# Patient Record
Sex: Male | Born: 1950 | Race: White | Hispanic: No | Marital: Married | Smoking: Former smoker
Health system: Southern US, Community
[De-identification: ages and names within clinical notes are randomized; demographics above are authoritative.]

## PROBLEM LIST (undated history)

## (undated) DIAGNOSIS — G4730 Sleep apnea, unspecified: Secondary | ICD-10-CM

## (undated) DIAGNOSIS — I10 Essential (primary) hypertension: Secondary | ICD-10-CM

## (undated) DIAGNOSIS — E11622 Type 2 diabetes mellitus with other skin ulcer: Secondary | ICD-10-CM

## (undated) DIAGNOSIS — K649 Unspecified hemorrhoids: Secondary | ICD-10-CM

## (undated) DIAGNOSIS — G8929 Other chronic pain: Secondary | ICD-10-CM

## (undated) DIAGNOSIS — K219 Gastro-esophageal reflux disease without esophagitis: Secondary | ICD-10-CM

## (undated) DIAGNOSIS — Z6841 Body Mass Index (BMI) 40.0 and over, adult: Secondary | ICD-10-CM

## (undated) DIAGNOSIS — M549 Dorsalgia, unspecified: Secondary | ICD-10-CM

## (undated) DIAGNOSIS — IMO0002 Reserved for concepts with insufficient information to code with codable children: Secondary | ICD-10-CM

## (undated) DIAGNOSIS — R569 Unspecified convulsions: Secondary | ICD-10-CM

## (undated) DIAGNOSIS — M1990 Unspecified osteoarthritis, unspecified site: Secondary | ICD-10-CM

## (undated) DIAGNOSIS — G894 Chronic pain syndrome: Secondary | ICD-10-CM

## (undated) DIAGNOSIS — J189 Pneumonia, unspecified organism: Secondary | ICD-10-CM

## (undated) DIAGNOSIS — M1712 Unilateral primary osteoarthritis, left knee: Secondary | ICD-10-CM

## (undated) DIAGNOSIS — E1065 Type 1 diabetes mellitus with hyperglycemia: Secondary | ICD-10-CM

## (undated) DIAGNOSIS — M109 Gout, unspecified: Secondary | ICD-10-CM

## (undated) HISTORY — DX: Unspecified osteoarthritis, unspecified site: M19.90

## (undated) HISTORY — DX: Unspecified convulsions: R56.9

## (undated) HISTORY — PX: WISDOM TOOTH EXTRACTION: SHX21

## (undated) HISTORY — PX: OTHER SURGICAL HISTORY: SHX169

## (undated) HISTORY — DX: Body Mass Index (BMI) 40.0 and over, adult: Z684

## (undated) HISTORY — DX: Reserved for concepts with insufficient information to code with codable children: IMO0002

## (undated) HISTORY — DX: Type 1 diabetes mellitus with hyperglycemia: E10.65

## (undated) HISTORY — DX: Essential (primary) hypertension: I10

## (undated) HISTORY — DX: Chronic pain syndrome: G89.4

## (undated) HISTORY — DX: Dorsalgia, unspecified: M54.9

## (undated) HISTORY — DX: Gout, unspecified: M10.9

## (undated) HISTORY — DX: Type 2 diabetes mellitus with other skin ulcer: E11.622

## (undated) HISTORY — DX: Unilateral primary osteoarthritis, left knee: M17.12

## (undated) HISTORY — DX: Other chronic pain: G89.29

## (undated) HISTORY — DX: Morbid (severe) obesity due to excess calories: E66.01

---

## 2001-09-13 ENCOUNTER — Ambulatory Visit (HOSPITAL_COMMUNITY): Admission: RE | Admit: 2001-09-13 | Discharge: 2001-09-13 | Payer: Self-pay | Admitting: Gastroenterology

## 2003-06-25 ENCOUNTER — Encounter: Admission: RE | Admit: 2003-06-25 | Discharge: 2003-09-23 | Payer: Self-pay | Admitting: Family Medicine

## 2004-09-15 ENCOUNTER — Ambulatory Visit (HOSPITAL_COMMUNITY): Admission: RE | Admit: 2004-09-15 | Discharge: 2004-09-15 | Payer: Self-pay | Admitting: Orthopedic Surgery

## 2005-07-15 ENCOUNTER — Encounter: Admission: RE | Admit: 2005-07-15 | Discharge: 2005-10-13 | Payer: Self-pay | Admitting: Family Medicine

## 2007-10-16 ENCOUNTER — Encounter: Admission: RE | Admit: 2007-10-16 | Discharge: 2007-11-01 | Payer: Self-pay | Admitting: Family Medicine

## 2007-11-15 ENCOUNTER — Encounter: Admission: RE | Admit: 2007-11-15 | Discharge: 2007-11-15 | Payer: Self-pay | Admitting: Family Medicine

## 2008-02-18 ENCOUNTER — Encounter (INDEPENDENT_AMBULATORY_CARE_PROVIDER_SITE_OTHER): Payer: Self-pay | Admitting: Family Medicine

## 2008-02-18 ENCOUNTER — Ambulatory Visit: Payer: Self-pay | Admitting: Surgery

## 2008-02-18 ENCOUNTER — Ambulatory Visit (HOSPITAL_COMMUNITY): Admission: RE | Admit: 2008-02-18 | Discharge: 2008-02-18 | Payer: Self-pay | Admitting: Family Medicine

## 2009-05-04 ENCOUNTER — Ambulatory Visit: Payer: Self-pay | Admitting: Gastroenterology

## 2010-01-15 LAB — HM COLONOSCOPY

## 2011-03-25 NOTE — Procedures (Signed)
[place]  Patient:    Christian [name], BALDREE Visit Number: [id number] MRN: [id number]          Service Type: Attending:  Verlin Grills, M.D. Dictated by:   Verlin Grills, M.D. Proc. Date: 09/13/01   CC:         Caryn Bee L. Little, M.D.   Procedure Report  DATE OF BIRTH:  [birth year]  REFERRING PHYSICIAN:  Anna Genre. Little, M.D.  PROCEDURE PERFORMED:  Colonoscopy.  ENDOSCOPIST:  Verlin Grills, M.D.  INDICATIONS FOR PROCEDURE:  The patient is a 60 year old male who has intermittent painless hematochezia.  He denies a personal or family history of colon cancer.  MEDICATION ALLERGIES:  Penicillin.  CHRONIC MEDICATIONS:  Arthrotek and allopurinol.  PAST MEDICAL HISTORY:  Gout, epileptic seizures in 1977, degenerative joint disease.  PREMEDICATION:  Versed 5 mg, Demerol 50 mg.  ENDOSCOPE:  Olympus pediatric video colonoscope.  DESCRIPTION OF PROCEDURE:  After obtaining informed consent, the patient was placed in the left lateral decubitus position.  I administered intravenous Demerol and intravenous Versed to achieve conscious sedation for the procedure.  The patients blood pressure, oxygen saturation and cardiac rhythm were monitored throughout the procedure and documented in the medical record.  Anal inspection was normal.  Digital rectal exam revealed a nonnodular prostate.  The Olympus pediatric video colonoscope was then introduced into the rectum and easily advanced to the cecum.  The patients colonic preparation for the exam today was excellent.  Rectum:  Normal.  Sigmoid colon and descending colon:  Left colonic diverticulosis.  Splenic flexure:  Normal.  Transverse colon:  Normal.  Hepatic flexure:  Normal.  Ascending colon:  Normal.  Cecum and ileocecal valve:  Normal.  ASSESSMENT:  A few small diverticula are noted in the left colon; otherwise normal proctocolonoscopy to the cecum.  No  endoscopic evidence for the presence of colorectal neoplasia or inflammatory bowel disease. Dictated by:   Verlin Grills, M.D. Attending:  Verlin Grills, M.D. DD:  09/13/01 TD:  09/14/01 Job: [id number]

## 2013-09-11 ENCOUNTER — Encounter: Payer: Self-pay | Admitting: Family Medicine

## 2013-09-11 ENCOUNTER — Ambulatory Visit (INDEPENDENT_AMBULATORY_CARE_PROVIDER_SITE_OTHER): Payer: BC Managed Care – PPO | Admitting: Family Medicine

## 2013-09-11 VITALS — BP 120/80 | HR 73 | Temp 97.7°F | Ht 67.0 in | Wt 304.2 lb

## 2013-09-11 DIAGNOSIS — Z79899 Other long term (current) drug therapy: Secondary | ICD-10-CM

## 2013-09-11 DIAGNOSIS — M109 Gout, unspecified: Secondary | ICD-10-CM

## 2013-09-11 DIAGNOSIS — G894 Chronic pain syndrome: Secondary | ICD-10-CM

## 2013-09-11 DIAGNOSIS — M1990 Unspecified osteoarthritis, unspecified site: Secondary | ICD-10-CM

## 2013-09-11 DIAGNOSIS — E119 Type 2 diabetes mellitus without complications: Secondary | ICD-10-CM

## 2013-09-11 DIAGNOSIS — M549 Dorsalgia, unspecified: Secondary | ICD-10-CM

## 2013-09-11 DIAGNOSIS — R569 Unspecified convulsions: Secondary | ICD-10-CM

## 2013-09-11 DIAGNOSIS — G8929 Other chronic pain: Secondary | ICD-10-CM

## 2013-09-11 DIAGNOSIS — I10 Essential (primary) hypertension: Secondary | ICD-10-CM | POA: Insufficient documentation

## 2013-09-11 DIAGNOSIS — Z13220 Encounter for screening for lipoid disorders: Secondary | ICD-10-CM

## 2013-09-11 DIAGNOSIS — Z6841 Body Mass Index (BMI) 40.0 and over, adult: Secondary | ICD-10-CM

## 2013-09-11 LAB — MICROALBUMIN / CREATININE URINE RATIO
Creatinine,U: 67.8 mg/dL
Microalb Creat Ratio: 0.3 mg/g (ref 0.0–30.0)
Microalb, Ur: 0.2 mg/dL (ref 0.0–1.9)

## 2013-09-11 LAB — HEPATIC FUNCTION PANEL
AST: 32 U/L (ref 0–37)
Albumin: 4 g/dL (ref 3.5–5.2)
Alkaline Phosphatase: 65 U/L (ref 39–117)
Bilirubin, Direct: 0 mg/dL (ref 0.0–0.3)
Total Bilirubin: 0.7 mg/dL (ref 0.3–1.2)
Total Protein: 8.2 g/dL (ref 6.0–8.3)

## 2013-09-11 LAB — HEMOGLOBIN A1C: Hgb A1c MFr Bld: 9.8 % — ABNORMAL HIGH (ref 4.6–6.5)

## 2013-09-11 LAB — CBC WITH DIFFERENTIAL/PLATELET
Basophils Absolute: 0.1 10*3/uL (ref 0.0–0.1)
Basophils Relative: 0.4 % (ref 0.0–3.0)
Eosinophils Absolute: 0.4 10*3/uL (ref 0.0–0.7)
Eosinophils Relative: 3.5 % (ref 0.0–5.0)
HCT: 40.6 % (ref 39.0–52.0)
Hemoglobin: 13.6 g/dL (ref 13.0–17.0)
Lymphocytes Relative: 23.5 % (ref 12.0–46.0)
Lymphs Abs: 3 10*3/uL (ref 0.7–4.0)
MCHC: 33.4 g/dL (ref 30.0–36.0)
MCV: 97.2 fl (ref 78.0–100.0)
Monocytes Absolute: 0.8 10*3/uL (ref 0.1–1.0)
Monocytes Relative: 6.5 % (ref 3.0–12.0)
Neutro Abs: 8.3 10*3/uL — ABNORMAL HIGH (ref 1.4–7.7)
Neutrophils Relative %: 66.1 % (ref 43.0–77.0)
Platelets: 247 10*3/uL (ref 150.0–400.0)
RBC: 4.18 Mil/uL — ABNORMAL LOW (ref 4.22–5.81)
RDW: 13.6 % (ref 11.5–14.6)
WBC: 12.6 10*3/uL — ABNORMAL HIGH (ref 4.5–10.5)

## 2013-09-11 LAB — BASIC METABOLIC PANEL
BUN: 13 mg/dL (ref 6–23)
CO2: 27 mEq/L (ref 19–32)
Calcium: 9.5 mg/dL (ref 8.4–10.5)
Chloride: 100 mEq/L (ref 96–112)
Creatinine, Ser: 0.7 mg/dL (ref 0.4–1.5)
GFR: 117.22 mL/min (ref >60.00)
Glucose, Bld: 145 mg/dL — ABNORMAL HIGH (ref 70–99)
Potassium: 4 mEq/L (ref 3.5–5.1)
Sodium: 136 mEq/L (ref 135–145)

## 2013-09-11 LAB — LIPID PANEL
Cholesterol: 163 mg/dL (ref 0–200)
LDL Cholesterol: 92 mg/dL (ref 0–99)
Total CHOL/HDL Ratio: 4
Triglycerides: 164 mg/dL — ABNORMAL HIGH (ref 0.0–149.0)

## 2013-09-11 MED ORDER — OXYCODONE-ACETAMINOPHEN 5-325 MG PO TABS: 1.0000 | ORAL_TABLET | Freq: Two times a day (BID) | ORAL | Status: DC

## 2013-09-11 NOTE — Progress Notes (Signed)
Pre-visit discussion using our clinic review tool. No additional management support is needed unless otherwise documented below in the visit note.  

## 2013-09-11 NOTE — Progress Notes (Signed)
Date:  09/11/2013   Name:  Christian [name]   DOB:  Nov 02, 1951   MRN:  [id number] Gender: male Age: 62 y.o.  Primary Physician:  Hannah Beat, MD   Chief Complaint: New Patient   Subjective:   History of Present Illness:  Christian [name] is a 62 y.o. pleasant patient who presents with the following:  Former patient of Dr. Prince Rome  DM, insulin dependent and on lantus at 68 units now. Also taking metformin. 68 units of lantus - was seeing Dr. Carmela Hurt before this. This morning it was 203. 160-170 normally.   Reports that his lipids have been good for his lifetime.  Has some OA, chronic intermittent back pain and takes some nsaids and intermitent percocet.   He also has very stable gout.  Patient Active Problem List   Diagnosis Date Noted  . Gout 09/13/2013  . Chronic pain syndrome 09/13/2013  . Osteoarthritis 09/13/2013  . Chronic back pain 09/13/2013  . Seizures   . Hypertension   . Type 1 diabetes mellitus, uncontrolled     Past Medical History  Diagnosis Date  . Hypertension   . Seizures   . Type 1 diabetes mellitus, uncontrolled   . Gout 09/13/2013  . Chronic pain syndrome 09/13/2013  . Osteoarthritis 09/13/2013  . Chronic back pain 09/13/2013    No past surgical history on file.  History   Social History  . Marital Status: Married    Spouse Name: N/A    Number of Children: N/A  . Years of Education: N/A   Occupational History  . Not on file.   Social History Main Topics  . Smoking status: Former Games developer  . Smokeless tobacco: Never Used  . Alcohol Use: Yes     Comment: rare  . Drug Use: No  . Sexual Activity: Yes    Partners: Female   Other Topics Concern  . Not on file   Social History Narrative   Psychiatric nurse for [employer]   Married    Family History  Problem Relation Age of Onset  . Alcohol abuse Father   . Diabetes Maternal Aunt   . Arthritis Maternal Grandmother   . Alcohol abuse Paternal Grandmother   . Alcohol abuse  Paternal Grandfather     Allergies  Allergen Reactions  . Penicillins     REACTION: swelling, rash    Medication list has been reviewed and updated.  Review of Systems:   GEN: No fevers, chills. Nontoxic. Primarily MSK c/o today. MSK: Detailed in the HPI GI: tolerating PO intake without difficulty Neuro: No numbness, parasthesias, or tingling associated. Otherwise the pertinent positives of the ROS are noted above.    Objective:   Physical Examination: BP 120/80  Pulse 73  Temp(Src) 97.7 F (36.5 C) (Oral)  Ht 5\' 7"  (1.702 m)  Wt 304 lb 4 oz (138.007 kg)  BMI 47.64 kg/m2  Ideal Body Weight: Weight in (lb) to have BMI = 25: 159.3   GEN: WDWN, NAD, Non-toxic, A & O x 3 HEENT: Atraumatic, Normocephalic. Neck supple. No masses, No LAD. Ears and Nose: No external deformity. CV: RRR, No M/G/R. No JVD. No thrill. No extra heart sounds. PULM: CTA B, no wheezes, crackles, rhonchi. No retractions. No resp. distress. No accessory muscle use. EXTR: No c/c/e NEURO Normal gait.  PSYCH: Normally interactive. Conversant. Not depressed or anxious appearing.  Calm demeanor.    Assessment & Plan:    Diabetes mellitus without complication - Plan: Basic metabolic panel,  Hemoglobin A1c, Microalbumin / creatinine urine ratio  Hypertension  Screening for lipoid disorders - Plan: Lipid panel  Encounter for long-term (current) use of other medications - Plan: CBC with Differential, Hepatic function panel  Seizures  Gout  Chronic pain syndrome  Osteoarthritis  Chronic back pain  Morbid obesity with body mass index of 45.0-49.9 in adult  We will obtain records from the patient's prior physicians.  Will check and see how he is doing with DM and adjust meds as needed. Check lipids, as well.  There are no Patient Instructions on file for this visit.  Orders Today:  Orders Placed This Encounter  Procedures  . Basic metabolic panel  . CBC with Differential  . Hepatic  function panel  . Hemoglobin A1c  . Microalbumin / creatinine urine ratio  . Lipid panel    New medications, updates to list, dose adjustments: Meds ordered this encounter  Medications  . aspirin 81 MG chewable tablet    Sig: Chew 81 mg by mouth daily.  Marland Kitchen allopurinol (ZYLOPRIM) 300 MG tablet    Sig: Take 150 mg by mouth daily.  . Diclofenac-Misoprostol 75-0.2 MG TBEC    Sig: Take 1 tablet by mouth 2 (two) times daily.  . ergocalciferol (VITAMIN D2) 50000 UNITS capsule    Sig: Take 50,000 Units by mouth every 30 (thirty) days.  Marland Kitchen DISCONTD: oxyCODONE-acetaminophen (PERCOCET/ROXICET) 5-325 MG per tablet    Sig: Take 1 tablet by mouth 2 (two) times daily.  . bisoprolol-hydrochlorothiazide (ZIAC) 10-6.25 MG per tablet    Sig: Take 1 tablet by mouth every morning.  . metFORMIN (GLUCOPHAGE) 500 MG tablet    Sig: Take 1,000 mg by mouth 2 (two) times daily at 8 am and 10 pm.  . Insulin Glargine (LANTUS SOLOSTAR) 100 UNIT/ML SOPN    Sig: Inject 68 Units into the skin at bedtime.  Marland Kitchen oxyCODONE-acetaminophen (PERCOCET/ROXICET) 5-325 MG per tablet    Sig: Take 1 tablet by mouth 2 (two) times daily.    Dispense:  60 tablet    Refill:  0    Signed,  Hayat Warbington T. Ladon Vandenberghe, MD, CAQ Sports Medicine  [facility] at [facility] [address] [place] [address] Phone: [phone] Fax: [phone]  Updated Complete Medication List:   Medication List       This list is accurate as of: 09/11/13 11:59 PM.  Always use your most recent med list.               allopurinol 300 MG tablet  Commonly known as:  ZYLOPRIM  Take 150 mg by mouth daily.     aspirin 81 MG chewable tablet  Chew 81 mg by mouth daily.     bisoprolol-hydrochlorothiazide 10-6.25 MG per tablet  Commonly known as:  ZIAC  Take 1 tablet by mouth every morning.     Diclofenac-Misoprostol 75-0.2 MG Tbec  Take 1 tablet by mouth 2 (two) times daily.     ergocalciferol 50000 UNITS capsule  Commonly known as:   VITAMIN D2  Take 50,000 Units by mouth every 30 (thirty) days.     LANTUS SOLOSTAR 100 UNIT/ML Sopn  Generic drug:  Insulin Glargine  Inject 68 Units into the skin at bedtime.     metFORMIN 500 MG tablet  Commonly known as:  GLUCOPHAGE  Take 1,000 mg by mouth 2 (two) times daily at 8 am and 10 pm.     oxyCODONE-acetaminophen 5-325 MG per tablet  Commonly known as:  PERCOCET/ROXICET  Take 1 tablet by mouth 2 (two) times daily.

## 2013-09-13 ENCOUNTER — Encounter: Payer: Self-pay | Admitting: Family Medicine

## 2013-09-13 ENCOUNTER — Other Ambulatory Visit: Payer: Self-pay | Admitting: Family Medicine

## 2013-09-13 DIAGNOSIS — M109 Gout, unspecified: Secondary | ICD-10-CM

## 2013-09-13 DIAGNOSIS — G8929 Other chronic pain: Secondary | ICD-10-CM | POA: Insufficient documentation

## 2013-09-13 DIAGNOSIS — G894 Chronic pain syndrome: Secondary | ICD-10-CM

## 2013-09-13 DIAGNOSIS — M1990 Unspecified osteoarthritis, unspecified site: Secondary | ICD-10-CM

## 2013-09-13 DIAGNOSIS — M1712 Unilateral primary osteoarthritis, left knee: Secondary | ICD-10-CM | POA: Insufficient documentation

## 2013-09-13 HISTORY — DX: Chronic pain syndrome: G89.4

## 2013-09-13 HISTORY — DX: Gout, unspecified: M10.9

## 2013-09-13 HISTORY — DX: Morbid (severe) obesity due to excess calories: E66.01

## 2013-09-13 HISTORY — DX: Other chronic pain: G89.29

## 2013-09-13 HISTORY — DX: Unspecified osteoarthritis, unspecified site: M19.90

## 2013-09-22 ENCOUNTER — Other Ambulatory Visit: Payer: Self-pay | Admitting: Internal Medicine

## 2013-09-23 ENCOUNTER — Other Ambulatory Visit: Payer: Self-pay | Admitting: Family Medicine

## 2013-09-30 ENCOUNTER — Ambulatory Visit (INDEPENDENT_AMBULATORY_CARE_PROVIDER_SITE_OTHER): Payer: BC Managed Care – PPO | Admitting: Family Medicine

## 2013-09-30 ENCOUNTER — Encounter: Payer: Self-pay | Admitting: Family Medicine

## 2013-09-30 VITALS — BP 128/70 | HR 81 | Temp 97.9°F | Ht 67.0 in | Wt 302.8 lb

## 2013-09-30 DIAGNOSIS — M1711 Unilateral primary osteoarthritis, right knee: Secondary | ICD-10-CM

## 2013-09-30 DIAGNOSIS — M1710 Unilateral primary osteoarthritis, unspecified knee: Secondary | ICD-10-CM

## 2013-09-30 MED ORDER — BISOPROLOL-HYDROCHLOROTHIAZIDE 10-6.25 MG PO TABS: 1.0000 | ORAL_TABLET | Freq: Every morning | ORAL | Status: DC

## 2013-09-30 NOTE — Progress Notes (Signed)
Pre-visit discussion using our clinic review tool. No additional management support is needed unless otherwise documented below in the visit note.  

## 2013-09-30 NOTE — Progress Notes (Signed)
    Procedure only. Has done well from synvisc in the past, proven OA.  Synvisc-1 Injection.  R knee.   Knee Injection: Synvisc-One, RIGHT Patient verbally consented to procedure. Risks (including infection), benefits, and alternatives explained. Sterilely prepped with Chloraprep. Ethyl cholride used for anesthesia, then 7 cc of Lidocaine 1% used for anesthesia in the anterolateral position. Reprepped with Chloraprep.  Anterolateral approach used to inject joint without difficulty, injected with Synvisc-One, 6 mL. No complications with procedure and tolerated well.   Osteoarthrosis, unspecified whether generalized or localized, lower leg  Arthritis of right knee  There are no Patient Instructions on file for this visit.  Orders Today:  No orders of the defined types were placed in this encounter.    New medications, updates to list, dose adjustments: Meds ordered this encounter  Medications  . bisoprolol-hydrochlorothiazide (ZIAC) 10-6.25 MG per tablet    Sig: Take 1 tablet by mouth every morning.    Dispense:  90 tablet    Refill:  3    Signed,  Jaaron Oleson T. Undine Nealis, MD, CAQ Sports Medicine  [facility] at [facility] [address] [place] [address] Phone: [phone] Fax: [phone]  Updated Complete Medication List:   Medication List       This list is accurate as of: 09/30/13 10:34 AM.  Always use your most recent med list.               allopurinol 300 MG tablet  Commonly known as:  ZYLOPRIM  TAKE 1 TABLET BY MOUTH DAILY FOR GOUT     aspirin 81 MG chewable tablet  Chew 81 mg by mouth daily.     bisoprolol-hydrochlorothiazide 10-6.25 MG per tablet  Commonly known as:  ZIAC  Take 1 tablet by mouth every morning.     Diclofenac-Misoprostol 75-0.2 MG Tbec  TAKE 1 TABLET BY MOUTH TWICE A DAY AS NEEDED FOR PAIN AND INFLAMMATION     ergocalciferol 50000 UNITS capsule  Commonly known as:  VITAMIN D2  Take 50,000 Units by mouth every 30  (thirty) days.     LANTUS SOLOSTAR 100 UNIT/ML Sopn  Generic drug:  Insulin Glargine  Inject 68 Units into the skin at bedtime.     metFORMIN 500 MG tablet  Commonly known as:  GLUCOPHAGE  Take 1,000 mg by mouth 2 (two) times daily at 8 am and 10 pm.     oxyCODONE-acetaminophen 5-325 MG per tablet  Commonly known as:  PERCOCET/ROXICET  Take 1 tablet by mouth 2 (two) times daily.

## 2013-10-30 ENCOUNTER — Other Ambulatory Visit: Payer: Self-pay | Admitting: Family Medicine

## 2013-12-09 ENCOUNTER — Encounter: Payer: Self-pay | Admitting: Family Medicine

## 2013-12-09 ENCOUNTER — Ambulatory Visit (INDEPENDENT_AMBULATORY_CARE_PROVIDER_SITE_OTHER): Payer: BC Managed Care – PPO | Admitting: Family Medicine

## 2013-12-09 VITALS — BP 130/90 | HR 85 | Temp 97.9°F | Wt 307.5 lb

## 2013-12-09 DIAGNOSIS — E1065 Type 1 diabetes mellitus with hyperglycemia: Secondary | ICD-10-CM

## 2013-12-09 DIAGNOSIS — H6980 Other specified disorders of Eustachian tube, unspecified ear: Secondary | ICD-10-CM

## 2013-12-09 DIAGNOSIS — H8110 Benign paroxysmal vertigo, unspecified ear: Secondary | ICD-10-CM | POA: Insufficient documentation

## 2013-12-09 DIAGNOSIS — IMO0002 Reserved for concepts with insufficient information to code with codable children: Secondary | ICD-10-CM

## 2013-12-09 LAB — COMPREHENSIVE METABOLIC PANEL
ALT: 33 U/L (ref 0–53)
AST: 27 U/L (ref 0–37)
Albumin: 3.7 g/dL (ref 3.5–5.2)
Alkaline Phosphatase: 62 U/L (ref 39–117)
BILIRUBIN TOTAL: 0.8 mg/dL (ref 0.3–1.2)
BUN: 13 mg/dL (ref 6–23)
CO2: 26 mEq/L (ref 19–32)
Calcium: 9 mg/dL (ref 8.4–10.5)
Chloride: 103 mEq/L (ref 96–112)
Creatinine, Ser: 0.8 mg/dL (ref 0.4–1.5)
GFR: 111.74 mL/min (ref >60.00)
Glucose, Bld: 180 mg/dL — ABNORMAL HIGH (ref 70–99)
Potassium: 4.4 mEq/L (ref 3.5–5.1)
Sodium: 138 mEq/L (ref 135–145)
Total Protein: 7.6 g/dL (ref 6.0–8.3)

## 2013-12-09 MED ORDER — TRIAMCINOLONE ACETONIDE 0.1 % EX CREA: 1.0000 "application " | TOPICAL_CREAM | Freq: Two times a day (BID) | CUTANEOUS | Status: DC

## 2013-12-09 MED ORDER — FLUTICASONE PROPIONATE 50 MCG/ACT NA SUSP: 2.0000 | Freq: Every day | NASAL | Status: DC

## 2013-12-09 NOTE — Assessment & Plan Note (Signed)
Treat with nasal steroid. °

## 2013-12-09 NOTE — Patient Instructions (Signed)
Start nasal steroid spray.  Can use meclizine (antivert) as needed for vertigo. Start home BPPV exercises. Call if not improving with vertigo in 2 weeks. Stop at lab on way out for routine labs. Schedule DM follow up with Dr. Patsy Lageropland in next week or 2.

## 2013-12-09 NOTE — Assessment & Plan Note (Signed)
Home exercises given, use meclizine prn severe symptoms. Follow up if not better in 2 weeks.

## 2013-12-09 NOTE — Progress Notes (Signed)
   Subjective:    Patient ID: Christian BarmanGeorge E [name], male    DOB: 08/18/1951, 63 y.o.   MRN: [id number]  HPI  [age] year old male pt of Dr. Patsy Lageropland with history of uncontrolled DM (175), HTN, chronic back pain presents with new onset of dizziness in last 4 days. Sudden onset.  Describes like vertigo. No nausea, no emeisis. Right ear popping at times.  Worse with movement of head. Worse with lying and rolling over. No headache. Symptoms have been intermittent, but returned yesterday morning.    He has had similar symptoms in past as SE of BP med.  No new medication, does have new glasses.  using oxycodone once a day, has no changed.   no fever.   Review of Systems  Constitutional: Negative for fever and fatigue.  HENT: Negative for ear pain.   Eyes: Negative for pain.  Respiratory: Negative for cough and shortness of breath.   Cardiovascular: Negative for chest pain.  Gastrointestinal: Negative for abdominal pain.  Musculoskeletal:       Right knee pain intermittant       Objective:   Physical Exam  Constitutional: He is oriented to person, place, and time. Vital signs are normal. He appears well-developed and well-nourished.  Morbidly obese male in NAD  HENT:  Head: Normocephalic.  Right Ear: Hearing normal. Tympanic membrane is not erythematous. A middle ear effusion is present.  Left Ear: Hearing normal. Tympanic membrane is not erythematous.  No middle ear effusion.  Nose: Nose normal.  Mouth/Throat: Oropharynx is clear and moist and mucous membranes are normal.  Neck: Trachea normal. Carotid bruit is not present. No mass and no thyromegaly present.  Cardiovascular: Normal rate, regular rhythm and normal pulses.  Exam reveals no gallop, no distant heart sounds and no friction rub.   No murmur heard. No peripheral edema  Pulmonary/Chest: Effort normal and breath sounds normal. No respiratory distress.  Neurological: He is alert and oriented to person, place, and time. He has  normal strength. No cranial nerve deficit or sensory deficit. He displays a negative Romberg sign. Coordination and gait normal.  Positive Dix Hallpike showing nystagmus  Skin: Skin is warm, dry and intact. No rash noted.  Psychiatric: He has a normal mood and affect. His speech is normal and behavior is normal. Thought content normal.          Assessment & Plan:

## 2013-12-09 NOTE — Progress Notes (Signed)
Pre-visit discussion using our clinic review tool. No additional management support is needed unless otherwise documented below in the visit note.  

## 2013-12-12 ENCOUNTER — Telehealth: Payer: Self-pay

## 2013-12-12 NOTE — Telephone Encounter (Signed)
Relevant patient education assigned to patient using Emmi. ° °

## 2014-01-12 ENCOUNTER — Other Ambulatory Visit: Payer: Self-pay | Admitting: Family Medicine

## 2014-03-12 ENCOUNTER — Other Ambulatory Visit: Payer: Self-pay | Admitting: Family Medicine

## 2014-03-24 ENCOUNTER — Other Ambulatory Visit (INDEPENDENT_AMBULATORY_CARE_PROVIDER_SITE_OTHER): Payer: BC Managed Care – PPO

## 2014-03-24 DIAGNOSIS — IMO0002 Reserved for concepts with insufficient information to code with codable children: Secondary | ICD-10-CM

## 2014-03-24 DIAGNOSIS — E1065 Type 1 diabetes mellitus with hyperglycemia: Secondary | ICD-10-CM

## 2014-03-24 LAB — HEMOGLOBIN A1C: Hgb A1c MFr Bld: 10.8 % — ABNORMAL HIGH (ref 4.6–6.5)

## 2014-03-26 ENCOUNTER — Ambulatory Visit: Payer: BC Managed Care – PPO | Admitting: Family Medicine

## 2014-03-26 ENCOUNTER — Ambulatory Visit (INDEPENDENT_AMBULATORY_CARE_PROVIDER_SITE_OTHER): Payer: BC Managed Care – PPO | Admitting: Family Medicine

## 2014-03-26 ENCOUNTER — Encounter: Payer: Self-pay | Admitting: Family Medicine

## 2014-03-26 VITALS — BP 140/90 | HR 70 | Temp 98.1°F | Ht 67.0 in | Wt 263.5 lb

## 2014-03-26 DIAGNOSIS — IMO0002 Reserved for concepts with insufficient information to code with codable children: Secondary | ICD-10-CM

## 2014-03-26 DIAGNOSIS — G8929 Other chronic pain: Secondary | ICD-10-CM

## 2014-03-26 DIAGNOSIS — E1165 Type 2 diabetes mellitus with hyperglycemia: Secondary | ICD-10-CM

## 2014-03-26 DIAGNOSIS — G894 Chronic pain syndrome: Secondary | ICD-10-CM

## 2014-03-26 DIAGNOSIS — M549 Dorsalgia, unspecified: Secondary | ICD-10-CM

## 2014-03-26 DIAGNOSIS — IMO0001 Reserved for inherently not codable concepts without codable children: Secondary | ICD-10-CM

## 2014-03-26 MED ORDER — OXYCODONE-ACETAMINOPHEN 5-325 MG PO TABS: 1.0000 | ORAL_TABLET | Freq: Two times a day (BID) | ORAL | Status: DC

## 2014-03-26 NOTE — Progress Notes (Signed)
Pre visit review using our clinic review tool, if applicable. No additional management support is needed unless otherwise documented below in the visit note. 

## 2014-03-26 NOTE — Progress Notes (Signed)
[address] [place] [address] Phone: [phone] Fax: [phone]  Patient ID: Christian BarmanGeorge E [name] MRN: [id number], DOB: 12/04/1950, 63 y.o. Date of Encounter: 03/26/2014  Primary Physician:  Hannah BeatSpencer Neysa Arts, MD   Chief Complaint: Diabetes   Subjective:   History of Present Illness:  Christian [name] is a 63 y.o. very pleasant male patient who presents with the following:  F/u DM:  lantus 76 units at bedtime.  Also taking metformin 1000 mg bid.   Lost 40 pounds.   Lab Results  Component Value Date   HGBA1C 10.8* 03/24/2014   Greens and lettuce has been tearin gup his digestive system.  ? Picked up an intolerance to ice cream   Working all the time. Not able to work out a lot. Working upwards of 80 hours a week. 7 - 8 PM during the week. No hypoglycemia. No nausea, blurred vision.   Intermittent back pain with rare percocet needs. Morbid obesity.  Past Medical History, Surgical History, Social History, Family History, Problem List, Medications, and Allergies have been reviewed and updated if relevant.  Review of Systems:  GEN: No acute illnesses, no fevers, chills. GI: No n/v/d, eating normally Pulm: No SOB Interactive and getting along well at home.  Otherwise, ROS is as per the HPI.  Objective:   Physical Examination: BP 140/90  Pulse 70  Temp(Src) 98.1 F (36.7 C) (Oral)  Ht 5\' 7"  (1.702 m)  Wt 263 lb 8 oz (119.523 kg)  BMI 41.26 kg/m2   GEN: WDWN, NAD, Non-toxic, A & O x 3 HEENT: Atraumatic, Normocephalic. Neck supple. No masses, No LAD. Ears and Nose: No external deformity. CV: RRR, No M/G/R. No JVD. No thrill. No extra heart sounds. PULM: CTA B, no wheezes, crackles, rhonchi. No retractions. No resp. distress. No accessory muscle use. EXTR: No c/c/e NEURO Normal gait.  PSYCH: Normally interactive. Conversant. Not depressed or anxious appearing.  Calm demeanor.   Laboratory and Imaging Data:  Assessment & Plan:   Diabetes  mellitus type 2, uncontrolled - Plan: Ambulatory referral to Endocrinology  Chronic back pain  Chronic pain syndrome  Alter insulin dosing as below. The patient is in very poor control with an A1c greater than 10. He is a very high insulin requirements, and he is also taking metformin twice a day at maximal dosing. At this point, I would like to consult endocrinology for their opinion.  Also gave him a refill on his Percocet, which he is using very sparingly.  Orders Placed This Encounter  Procedures  . Ambulatory referral to Endocrinology   Patient Instructions  Increase your Lantus to 78 units a night. Check BS for the next few days and if > 150, increase to 80 units.  Check it twice a day like you have been, and if persistently > 150, increase by 1-2 units of Lantus at daily dose.   REFERRALS TO SPECIALISTS, SPECIAL TESTS (MRI, CT, ULTRASOUNDS)  GO THE WAITING ROOM AND TELL CHECK IN YOU NEED HELP WITH A REFERRAL. Either MARION or LINDA will help you set it up.  If it is between 1-2 PM they may be at lunch.  After 5 PM, they will likely be at home.  They will call you, so please make sure the office has your correct phone number.  Referrals sometimes can be done same day if urgent, but others can take 2 or 3 days to get an appointment. Starting in 2015, many of the new Medicare insurance plans and  Affordable Care Act (Obamacare) Health plans offered take much longer for referrals. They have added additional paperwork and steps.  MRI's and CT's can take up to a week for the test. (Emergencies like strokes take precedence. I will tell you if you have an emergency.)   If [facility] [place] [facility], their office may contact you directly prior to our office reaching you.  -- Examples: [facility], [facility], [facility], [place]            [facility], [facility], and many more.  Specialist appointment times vary a great  deal, mostly on the specialist's schedule and if they have openings. -- Our office tries to get you in as fast as possible. -- Some specialists have very long wait times. (Example. Dermatology. Usually months) -- If you have a true emergency like new cancer, we work to get you in ASAP.       Signed,  Elpidio GaleaSpencer T. Lydian Chavous, MD, CAQ Sports Medicine   Patient's Medications  New Prescriptions   No medications on file  Previous Medications   ALLOPURINOL (ZYLOPRIM) 300 MG TABLET    TAKE 1 TABLET BY MOUTH DAILY FOR GOUT   ASPIRIN 81 MG CHEWABLE TABLET    Chew 81 mg by mouth daily.   BISOPROLOL-HYDROCHLOROTHIAZIDE (ZIAC) 10-6.25 MG PER TABLET    Take 1 tablet by mouth every morning.   DICLOFENAC-MISOPROSTOL 75-0.2 MG TBEC    TAKE 1 TABLET BY MOUTH TWICE A DAY AS NEEDED FOR PAIN AND INFLAMMATION   ERGOCALCIFEROL (VITAMIN D2) 50000 UNITS CAPSULE    Take 50,000 Units by mouth every 30 (thirty) days.   INSULIN GLARGINE (LANTUS SOLOSTAR) 100 UNIT/ML SOPN    Inject 76 Units into the skin at bedtime.    METFORMIN (GLUCOPHAGE) 500 MG TABLET    TAKE 2 TABS EVERY MORNING & 1 TAB EVERY EVENING.THEN INCREASE TO 2 TABS TWICE A DAY IF TOLERATED  Modified Medications   Modified Medication Previous Medication   OXYCODONE-ACETAMINOPHEN (PERCOCET/ROXICET) 5-325 MG PER TABLET oxyCODONE-acetaminophen (PERCOCET/ROXICET) 5-325 MG per tablet      Take 1 tablet by mouth 2 (two) times daily.    Take 1 tablet by mouth 2 (two) times daily.  Discontinued Medications   FLUTICASONE (FLONASE) 50 MCG/ACT NASAL SPRAY    Place 2 sprays into both nostrils daily.

## 2014-03-26 NOTE — Patient Instructions (Addendum)
Increase your Lantus to 78 units a night. Check BS for the next few days and if > 150, increase to 80 units.  Check it twice a day like you have been, and if persistently > 150, increase by 1-2 units of Lantus at daily dose.   REFERRALS TO SPECIALISTS, SPECIAL TESTS (MRI, CT, ULTRASOUNDS)  GO THE WAITING ROOM AND TELL CHECK IN YOU NEED HELP WITH A REFERRAL. Either MARION or LINDA will help you set it up.  If it is between 1-2 PM they may be at lunch.  After 5 PM, they will likely be at home.  They will call you, so please make sure the office has your correct phone number.  Referrals sometimes can be done same day if urgent, but others can take 2 or 3 days to get an appointment. Starting in 2015, many of the new Medicare insurance plans and Affordable Care Act (Obamacare) Health plans offered take much longer for referrals. They have added additional paperwork and steps.  MRI's and CT's can take up to a week for the test. (Emergencies like strokes take precedence. I will tell you if you have an emergency.)   If [facility] [place] office, their office may contact you directly prior to our office reaching you.  -- Examples: [facility], [facility], [facility], [place]            [facility], [facility], and many more.  Specialist appointment times vary a great deal, mostly on the specialist's schedule and if they have openings. -- Our office tries to get you in as fast as possible. -- Some specialists have very long wait times. (Example. Dermatology. Usually months) -- If you have a true emergency like new cancer, we work to get you in ASAP.

## 2014-04-02 ENCOUNTER — Ambulatory Visit (INDEPENDENT_AMBULATORY_CARE_PROVIDER_SITE_OTHER): Payer: BC Managed Care – PPO | Admitting: Internal Medicine

## 2014-04-02 ENCOUNTER — Ambulatory Visit: Payer: BC Managed Care – PPO | Admitting: Family Medicine

## 2014-04-02 ENCOUNTER — Encounter: Payer: Self-pay | Admitting: Internal Medicine

## 2014-04-02 VITALS — BP 118/66 | HR 78 | Temp 97.6°F | Resp 12 | Ht 67.0 in | Wt 307.0 lb

## 2014-04-02 DIAGNOSIS — E1165 Type 2 diabetes mellitus with hyperglycemia: Secondary | ICD-10-CM

## 2014-04-02 DIAGNOSIS — IMO0002 Reserved for concepts with insufficient information to code with codable children: Secondary | ICD-10-CM

## 2014-04-02 DIAGNOSIS — IMO0001 Reserved for inherently not codable concepts without codable children: Secondary | ICD-10-CM

## 2014-04-02 MED ORDER — INSULIN GLARGINE 100 UNIT/ML SOLOSTAR PEN: 50.0000 [IU] | PEN_INJECTOR | Freq: Every day | SUBCUTANEOUS | Status: DC

## 2014-04-02 MED ORDER — GLIPIZIDE ER 10 MG PO TB24: 10.0000 mg | ORAL_TABLET | Freq: Every day | ORAL | Status: DC

## 2014-04-02 MED ORDER — CANAGLIFLOZIN 100 MG PO TABS: 100.0000 mg | ORAL_TABLET | Freq: Every day | ORAL | Status: DC

## 2014-04-02 MED ORDER — CANAGLIFLOZIN 300 MG PO TABS: 300.0000 mg | ORAL_TABLET | Freq: Every day | ORAL | Status: DC

## 2014-04-02 NOTE — Progress Notes (Signed)
Patient ID: Christian [name], male   DOB: 02/01/1951, 63 y.o.   MRN: [id number]  HPI: Christian [name] is a 63 y.o.-year-old male, referred by his PCP, Dr.Copland, for management of DM2, insulin-dependent, uncontrolled, with complications (Peripheral neuropathy).  Patient has been diagnosed with diabetes in 2009; he started insulin in 2014.  Last hemoglobin A1c was: Lab Results  Component Value Date   HGBA1C 10.8* 03/24/2014   HGBA1C 9.8* 09/11/2013   Pt is on a regimen of: - Metformin 1000 mg po bid - Lantus pen 78 units qhs (1 week ago increase from 76) - bedtime He has been on Victoza in the past >> not efficient anymore.  Pt checks his sugars 2x a day and they are: - am: 170-278 - 2h after b'fast: n/c - before lunch: n/c - 2h after lunch: 150-360 - before dinner: n/c - 2h after dinner: n/c - bedtime: n/c - nighttime: n/c No lows. Lowest sugar was 150; he has hypoglycemia awareness at 90.  Highest sugar was 360.  Pt's meals are: - Breakfast: dry cereal + raisins + no milk; misc. Fruit (banana/orange); V8 juice - Lunch: usually eats out: fish + vegetables (coleslaw); lettuce gives him cramps - Dinner: usually frozen meal  - late - Snacks: 2-3 nabs [place] [facility]  - no CKD, last BUN/creatinine:  Lab Results  Component Value Date   BUN 13 12/09/2013   CREATININE 0.8 12/09/2013  Not on ACEI. - last set of lipids: Lab Results  Component Value Date   CHOL 163 09/11/2013   HDL 38.50* 09/11/2013   LDLCALC 92 09/11/2013   TRIG 164.0* 09/11/2013   CHOLHDL 4 09/11/2013  Not on statins - last eye exam was in 12/2013 - gets the eye exams every year [facility] No DR.  - + numbness and tingling in his feet.  Pt has no FH of DM.  ROS: Constitutional: no weight gain/loss, no fatigue, no subjective hyperthermia/hypothermia Eyes: no blurry vision, no xerophthalmia ENT: no sore throat, no nodules palpated in throat, no dysphagia/odynophagia, no hoarseness; +  decreased hearing Cardiovascular: no CP/SOB/palpitations/+ leg swelling Respiratory: no cough/SOB Gastrointestinal: no N/V/D/C Musculoskeletal: no muscle/+ joint aches Skin: no rashes Neurological: no tremors/numbness/tingling/dizziness Psychiatric: no depression/anxiety  Past Medical History  Diagnosis Date  . Hypertension   . Seizures   . Type 1 diabetes mellitus, uncontrolled   . Gout 09/13/2013  . Chronic pain syndrome 09/13/2013  . Osteoarthritis 09/13/2013  . Chronic back pain 09/13/2013  . Morbid obesity with body mass index of 45.0-49.9 in adult 09/13/2013   History reviewed. No pertinent past surgical history. History   Social History  . Marital Status: Married    Spouse Name: N/A    Number of Children: 3   Social History Main Topics  . Smoking status: Former Smoker    Quit date: 03/08/2004  . Smokeless tobacco: Never Used  . Alcohol Use: No     Comment: rare  . Drug Use: No  . 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 allopurinol (ZYLOPRIM) 300 MG tablet TAKE 1 TABLET BY MOUTH DAILY FOR GOUT  90 tablet  1  . aspirin 81 MG chewable tablet Chew 81 mg by mouth daily.      . bisoprolol-hydrochlorothiazide (ZIAC) 10-6.25 MG per  tablet Take 1 tablet by mouth every morning.  90 tablet  3  . Diclofenac-Misoprostol 75-0.2 MG TBEC TAKE 1 TABLET BY MOUTH TWICE A DAY AS NEEDED FOR PAIN AND INFLAMMATION  60 tablet  3  . ergocalciferol (VITAMIN D2) 50000 UNITS capsule Take 50,000 Units by mouth every 30 (thirty) days.      . metFORMIN (GLUCOPHAGE) 500 MG tablet TAKE 2 TABS EVERY MORNING & 1 TAB EVERY EVENING.THEN INCREASE TO 2 TABS TWICE A DAY IF TOLERATED  120 tablet  0  . oxyCODONE-acetaminophen (PERCOCET/ROXICET) 5-325 MG per tablet Take 1 tablet by mouth 2 (two) times daily.  60 tablet  0    No current facility-administered medications on file prior to visit.   Allergies  Allergen Reactions  . Penicillins     REACTION: swelling, rash   Family History  Problem Relation Age of Onset  . Alcohol abuse Father   . Diabetes Maternal Aunt   . Arthritis Maternal Grandmother   . Alcohol abuse Paternal Grandmother   . Alcohol abuse Paternal Grandfather    PE: BP 118/66  Pulse 78  Temp(Src) 97.6 F (36.4 C) (Oral)  Resp 12  Ht 5\' 7"  (1.702 m)  Wt 307 lb (139.254 kg)  BMI 48.07 kg/m2  SpO2 97% Wt Readings from Last 3 Encounters:  04/02/14 307 lb (139.254 kg)  03/26/14 263 lb 8 oz (119.523 kg)  12/09/13 307 lb 8 oz (139.481 kg)   Constitutional: obese, in NAD Eyes: PERRLA, EOMI, no exophthalmos ENT: moist mucous membranes, no thyromegaly, no cervical lymphadenopathy Cardiovascular: RRR, No MRG Respiratory: CTA B Gastrointestinal: abdomen soft, NT, ND, BS+ Musculoskeletal: no deformities, strength intact in all 4 Skin: moist, warm, no rashes Neurological: no tremor with outstretched hands, DTR normal in all 4  ASSESSMENT: 1. DM2, insulin-dependent, uncontrolled, with complications - PN  PLAN:  1. Patient with recently more uncontrolled diabetes, on Metformin + basal insulin, with persistent elevated sugars. He checks 2x a day and the CBGs are fluctuating - We discussed about options for treatment, and I suggested to decrease basal insulin and add Invokana and Glipizide:  Patient Instructions  - Continue Metformin 1000 mg 2x a daily - decrease Lantus to 50 units at bedtime - start Glipizide XL 10 mg in am - start Invokana 100 mg in am >> after a week, increase to 300 mg in am Please return in 1 month with your sugar log.  - we discussed about SEs of Invokana, which are: dizziness (advised to be careful when stands from sitting position), decreased BP - usually not < normal (BP today is not low), and fungal UTIs (advised to let me know if develops one).  - given  discount card for Invokana - Strongly advised him to start checking sugars at different times of the day - check 3 times a day, rotating checks - I am not sure if we can do w/o mealtime insulin but will try - given sugar log and advised how to fill it and to bring it at next appt  - given foot care handout and explained the principles  - given instructions for hypoglycemia management "15-15 rule"  - advised for yearly eye exams >> he is up to date - Return to clinic in 1 mo with sugar log

## 2014-04-02 NOTE — Patient Instructions (Signed)
-   Continue Metformin 1000 mg 2x a daily - decrease Lantus to 50 units at bedtime - start Glipizide XL 10 mg in am - start Invokana 100 mg in am >> after a week, increase to 300 mg in am  Please return in 1 month with your sugar log.   PATIENT INSTRUCTIONS FOR TYPE 2 DIABETES:  **Please join MyChart!** - see attached instructions about how to join if you have not done so already.  DIET AND EXERCISE Diet and exercise is an important part of diabetic treatment.  We recommended aerobic exercise in the form of brisk walking (working between 40-60% of maximal aerobic capacity, similar to brisk walking) for 150 minutes per week (such as 30 minutes five days per week) along with 3 times per week performing 'resistance' training (using various gauge rubber tubes with handles) 5-10 exercises involving the major muscle groups (upper body, lower body and core) performing 10-15 repetitions (or near fatigue) each exercise. Start at half the above goal but build slowly to reach the above goals. If limited by weight, joint pain, or disability, we recommend daily walking in a swimming pool with water up to waist to reduce pressure from joints while allow for adequate exercise.    BLOOD GLUCOSES Monitoring your blood glucoses is important for continued management of your diabetes. Please check your blood glucoses 2-4 times a day: fasting, before meals and at bedtime (you can rotate these measurements - e.g. one day check before the 3 meals, the next day check before 2 of the meals and before bedtime, etc.).   HYPOGLYCEMIA (low blood sugar) Hypoglycemia is usually a reaction to not eating, exercising, or taking too much insulin/ other diabetes drugs.  Symptoms include tremors, sweating, hunger, confusion, headache, etc. Treat IMMEDIATELY with 15 grams of Carbs:   4 glucose tablets    cup regular juice/soda   2 tablespoons raisins   4 teaspoons sugar   1 tablespoon honey Recheck blood glucose in 15 mins and  repeat above if still symptomatic/blood glucose <100.  RECOMMENDATIONS TO REDUCE YOUR RISK OF DIABETIC COMPLICATIONS: * Take your prescribed MEDICATION(S) * Follow a DIABETIC diet: Complex carbs, fiber rich foods, (monounsaturated and polyunsaturated) fats * AVOID saturated/trans fats, high fat foods, >2,300 mg salt per day. * EXERCISE at least 5 times a week for 30 minutes or preferably daily.  * DO NOT SMOKE OR DRINK more than 1 drink a day. * Check your FEET every day. Do not wear tightfitting shoes. Contact us if you develop an ulcer * See your EYE doctor once a year or more if needed * Get a FLU shot once a year * Get a PNEUMONIA vaccine once before and once after age 79 years  GOALS:  * Your Hemoglobin A1c of <7%  * fasting sugars need to be <130 * after meals sugars need to be <180 (2h after you start eating) * Your Systolic BP should be 140 or lower  * Your Diastolic BP should be 80 or lower  * Your HDL (Good Cholesterol) should be 40 or higher  * Your LDL (Bad Cholesterol) should be 100 or lower. * Your Triglycerides should be 150 or lower  * Your Urine microalbumin (kidney function) should be <30 * Your Body Mass Index should be 25 or lower   We will be glad to help you achieve these goals. Our telephone number is: [phone].

## 2014-04-16 ENCOUNTER — Other Ambulatory Visit: Payer: Self-pay | Admitting: Family Medicine

## 2014-05-11 ENCOUNTER — Other Ambulatory Visit: Payer: Self-pay | Admitting: Family Medicine

## 2014-05-13 ENCOUNTER — Other Ambulatory Visit: Payer: Self-pay | Admitting: Family Medicine

## 2014-05-29 ENCOUNTER — Encounter: Payer: Self-pay | Admitting: Internal Medicine

## 2014-05-29 ENCOUNTER — Ambulatory Visit (INDEPENDENT_AMBULATORY_CARE_PROVIDER_SITE_OTHER): Payer: BC Managed Care – PPO | Admitting: Internal Medicine

## 2014-05-29 VITALS — BP 124/78 | HR 78 | Temp 97.9°F | Resp 16 | Wt 297.0 lb

## 2014-05-29 DIAGNOSIS — E1165 Type 2 diabetes mellitus with hyperglycemia: Secondary | ICD-10-CM

## 2014-05-29 DIAGNOSIS — IMO0002 Reserved for concepts with insufficient information to code with codable children: Secondary | ICD-10-CM

## 2014-05-29 DIAGNOSIS — IMO0001 Reserved for inherently not codable concepts without codable children: Secondary | ICD-10-CM

## 2014-05-29 NOTE — Patient Instructions (Signed)
Please continue: - Metformin 1000 mg 2x a daily - Lantus 50 units at bedtime - Glipizide XL 10 mg in am - Invokana 300 mg in am   Please return in 1.5 month with your sugar log. We will check your HbA1c then.

## 2014-05-29 NOTE — Progress Notes (Signed)
Patient ID: Christian [name], male   DOB: 07/05/1951, 63 y.o.   MRN: [id number]  HPI: Christian BarmanGeorge E [name] is a 63 y.o.-year-old male, returning for f/u for DM2, dx 2009, insulin-dependent since 2014, uncontrolled, with complications (Peripheral neuropathy). Last visit 2 mo ago.  Last hemoglobin A1c was: Lab Results  Component Value Date   HGBA1C 10.8* 03/24/2014   HGBA1C 9.8* 09/11/2013   Pt was on a regimen of: - Metformin 1000 mg po bid - Lantus pen 78 units qhs (1 week ago increase from 76) - bedtime He has been on Victoza in the past >> not efficient anymore.  Pt is now on: - Metformin 1000 mg 2x a daily - Lantus 50 units at bedtime - Glipizide XL 10 mg in am - Invokana 300 mg in am >> nocturia Glipizide and Invokana were started 03/2014. He lost >10 lbs since then.  Pt checks his sugars 1-2x a day and they are: - am: 170-278 >> 121-135, 159, 164 - 2h after b'fast: n/c  - before lunch: n/c - 2h after lunch: 150-360 >> <180, 220 x1, 239 x1 - before dinner: n/c - 2h after dinner: n/c - bedtime: n/c - nighttime: n/c No lows. Lowest sugar was 121; he has hypoglycemia awareness at 90.  Highest sugar was 360 >> 239 now.  Pt's meals are: - Breakfast: dry cereal + raisins + no milk; misc. Fruit (banana/orange); V8 juice - Lunch: usually eats out: fish + vegetables (coleslaw); lettuce gives him cramps - Dinner: usually frozen meal  - late - Snacks: [place]  - no CKD, last BUN/creatinine:  Lab Results  Component Value Date   BUN 13 12/09/2013   CREATININE 0.8 12/09/2013  Not on ACEI. - last set of lipids: Lab Results  Component Value Date   CHOL 163 09/11/2013   HDL 38.50* 09/11/2013   LDLCALC 92 09/11/2013   TRIG 164.0* 09/11/2013   CHOLHDL 4 09/11/2013  Not on statins - last eye exam was in 12/2013 - gets the eye exams every year [facility] No DR.  - + numbness and tingling in his feet.  I reviewed pt's medications, allergies, PMH,  social hx, family hx and no changes required, except as mentioned above.  ROS: Constitutional: no weight gain/loss, no fatigue, no subjective hyperthermia/hypothermia, + nocturia Eyes: no blurry vision, no xerophthalmia ENT: no sore throat, no nodules palpated in throat, no dysphagia/odynophagia, no hoarseness; + decreased hearing Cardiovascular: no CP/SOB/palpitations/decreased leg swelling Respiratory: no cough/SOB Gastrointestinal: no N/V/D/C Musculoskeletal: no muscle/joint aches Skin: no rashes Neurological: no tremors/numbness/tingling/dizziness  PE: BP 124/78  Pulse 78  Temp(Src) 97.9 F (36.6 C) (Oral)  Resp 16  Wt 297 lb (134.718 kg)  SpO2 95% Wt Readings from Last 3 Encounters:  05/29/14 297 lb (134.718 kg)  04/02/14 307 lb (139.254 kg)  03/26/14 263 lb 8 oz (119.523 kg)   Constitutional: obese, in NAD Eyes: PERRLA, EOMI, no exophthalmos ENT: moist mucous membranes, no thyromegaly, no cervical lymphadenopathy Cardiovascular: RRR, No MRG Respiratory: CTA B Gastrointestinal: abdomen soft, NT, ND, BS+ Musculoskeletal: no deformities, strength intact in all 4 Skin: moist, warm, no rashes Neurological: no tremor with outstretched hands, DTR normal in all 4  ASSESSMENT: 1. DM2, insulin-dependent, uncontrolled, with complications - PN  PLAN:  1. Patient with uncontrolled diabetes, on oral meds + basal insulin, with much improved control in his blood sugars (per review of the log) and weight since last visit - We will continue:  Patient Instructions  Please continue: - Metformin 1000 mg 2x a daily - Lantus 50 units at bedtime - Glipizide XL 10 mg in am - Invokana 300 mg in am  Please return in 1.5 month with your sugar log. We will check your HbA1c then.  - no SEs of Invokana - Strongly advised him to start checking sugars at different times of the day - check 3 times a day, rotating checks - also check at bedtime - given new sugar logs and advised how to fill  it and to bring it at next appt  - he is up to date with eye exams - Return to clinic in 1.5 mo with sugar log (will check A1c then)

## 2014-07-03 ENCOUNTER — Other Ambulatory Visit: Payer: Self-pay | Admitting: Internal Medicine

## 2014-07-05 ENCOUNTER — Other Ambulatory Visit: Payer: Self-pay | Admitting: Family Medicine

## 2014-07-15 ENCOUNTER — Ambulatory Visit (INDEPENDENT_AMBULATORY_CARE_PROVIDER_SITE_OTHER): Payer: BC Managed Care – PPO | Admitting: Internal Medicine

## 2014-07-15 ENCOUNTER — Encounter: Payer: Self-pay | Admitting: *Deleted

## 2014-07-15 ENCOUNTER — Encounter: Payer: Self-pay | Admitting: Internal Medicine

## 2014-07-15 VITALS — BP 108/70 | HR 71 | Temp 97.7°F | Resp 12 | Wt 296.0 lb

## 2014-07-15 DIAGNOSIS — E1165 Type 2 diabetes mellitus with hyperglycemia: Secondary | ICD-10-CM

## 2014-07-15 DIAGNOSIS — IMO0002 Reserved for concepts with insufficient information to code with codable children: Secondary | ICD-10-CM

## 2014-07-15 DIAGNOSIS — IMO0001 Reserved for inherently not codable concepts without codable children: Secondary | ICD-10-CM

## 2014-07-15 LAB — BASIC METABOLIC PANEL
BUN: 18 mg/dL (ref 6–23)
CO2: 26 mEq/L (ref 19–32)
Calcium: 9.2 mg/dL (ref 8.4–10.5)
Chloride: 104 mEq/L (ref 96–112)
Creatinine, Ser: 0.9 mg/dL (ref 0.4–1.5)
GFR: 90.36 mL/min (ref >60.00)
Glucose, Bld: 134 mg/dL — ABNORMAL HIGH (ref 70–99)
POTASSIUM: 4.1 meq/L (ref 3.5–5.1)
Sodium: 138 mEq/L (ref 135–145)

## 2014-07-15 LAB — HEMOGLOBIN A1C: Hgb A1c MFr Bld: 8.1 % — ABNORMAL HIGH (ref 4.6–6.5)

## 2014-07-15 MED ORDER — INSULIN GLARGINE 100 UNIT/ML SOLOSTAR PEN: 60.0000 [IU] | PEN_INJECTOR | Freq: Every day | SUBCUTANEOUS | Status: DC

## 2014-07-15 NOTE — Progress Notes (Signed)
Patient ID: Christian [name], male   DOB: 1951/06/02, 63 y.o.   MRN: [id number]  HPI: Christian [name] is a 63 y.o.-year-old male, returning for f/u for DM2, dx 2009, insulin-dependent since 2014, uncontrolled, with complications (Peripheral neuropathy). Last visit 1.5 mo ago.  Last hemoglobin A1c was: Lab Results  Component Value Date   HGBA1C 10.8* 03/24/2014   HGBA1C 9.8* 09/11/2013   Pt was on a regimen of: - Metformin 1000 mg po bid - Lantus pen 78 units qhs (1 week ago increase from 76) - bedtime He has been on Victoza in the past >> not efficient anymore.  Pt is now on: - Metformin 1000 mg 2x a daily - Lantus 50 units at bedtime - Glipizide XL 10 mg in am - Invokana 300 mg in am >> nocturia Glipizide and Invokana were started 03/2014.  Pt checks his sugars 1x a day and they are - (only checks in am!): - am: 170-278 >> 121-135, 159, 164 >> 127, 139-250 (lower nr's if he eats a small dinner) - 2h after b'fast: n/c >> 157 - before lunch: n/c >> 190, 203 - 2h after lunch: 150-360 >> <180, 220 x1, 239 x1 >> n/c - before dinner: n/c - 2h after dinner: n/c - bedtime: n/c - nighttime: n/c No lows. Lowest sugar was 139; he has hypoglycemia awareness at 90.  Highest sugar was 360 >> 239 >> 250.  Pt's meals are: - Breakfast: dry cereal + raisins + no milk; misc. Fruit (banana/orange); V8 juice - Lunch: usually eats out: fish + vegetables (coleslaw); lettuce gives him cramps - Dinner: usually frozen meal  - Snacks: 2-3 nabs Commutes every day to [facility]  - no CKD, last BUN/creatinine:  Lab Results  Component Value Date   BUN 13 12/09/2013   CREATININE 0.8 12/09/2013  Not on ACEI. - last set of lipids: Lab Results  Component Value Date   CHOL 163 09/11/2013   HDL 38.50* 09/11/2013   LDLCALC 92 09/11/2013   TRIG 164.0* 09/11/2013   CHOLHDL 4 09/11/2013  Not on statins - last eye exam was in 12/2013 - gets the eye exams every year [facility] No DR.  - + numbness and  tingling in his feet.  I reviewed pt's medications, allergies, PMH, social hx, family hx and no changes required, except as mentioned above.  ROS: Constitutional: no weight gain/loss, no fatigue, no subjective hyperthermia/hypothermia, + nocturia Eyes: no blurry vision, no xerophthalmia ENT: no sore throat, no nodules palpated in throat, no dysphagia/odynophagia, no hoarseness; + decreased hearing Cardiovascular: no CP/SOB/palpitations/decreased leg swelling Respiratory: no cough/SOB Gastrointestinal: no N/V/D/C Musculoskeletal: no muscle/joint aches Skin: no rashes Neurological: no tremors/numbness/tingling/dizziness  PE: BP 108/70  Pulse 71  Temp(Src) 97.7 F (36.5 C) (Oral)  Resp 12  Wt 296 lb (134.265 kg)  SpO2 95% Wt Readings from Last 3 Encounters:  07/15/14 296 lb (134.265 kg)  05/29/14 297 lb (134.718 kg)  04/02/14 307 lb (139.254 kg)   Constitutional: obese, in NAD Eyes: PERRLA, EOMI, no exophthalmos ENT: moist mucous membranes, no thyromegaly, no cervical lymphadenopathy Cardiovascular: RRR, No MRG Respiratory: CTA B Gastrointestinal: abdomen soft, NT, ND, BS+ Musculoskeletal: no deformities, strength intact in all 4 Skin: moist, warm, no rashes Neurological: no tremor with outstretched hands, DTR normal in all 4  ASSESSMENT: 1. DM2, insulin-dependent, uncontrolled, with complications - PN  PLAN:  1. Patient with uncontrolled diabetes, on oral meds + basal insulin, with still poor control (per review of the log).  We discussed about options for management >> discussed re-adding a GLP1 R agonist; he was on Victoza before, does not think his insurance covers it now - we could do Bydureon, but he would like to increase his Lantus first and see if this helps: -  Patient Instructions  Please continue: - Metformin 1000 mg 2x a daily  - Glipizide XL 10 mg in am  - Invokana 300 mg in am Please increase: - Lantus to 60 units at bedtime  Try to check sugars later in  the day, too, especially bedtime. Please stop at the lab. Please return in 1.5 month with your sugar log.  - no SEs of Invokana - again strongly advised him to start checking sugars at different times of the day - check 3 times a day, rotating checks - also check at bedtime - he is up to date with eye exams - check A1c and BMP now - Return to clinic in 1.5 mo with sugar log  Office Visit on 07/15/2014  Component Date Value Ref Range Status  . Hemoglobin A1C 07/15/2014 8.1* 4.6 - 6.5 % Final   Glycemic Control Guidelines for People with Diabetes:Non Diabetic:  <6%Goal of Therapy: <7%Additional Action Suggested:  >8%   . Sodium 07/15/2014 138  135 - 145 mEq/L Final  . Potassium 07/15/2014 4.1  3.5 - 5.1 mEq/L Final  . Chloride 07/15/2014 104  96 - 112 mEq/L Final  . CO2 07/15/2014 26  19 - 32 mEq/L Final  . Glucose, Bld 07/15/2014 134* 70 - 99 mg/dL Final  . BUN 16/08/9603 18  6 - 23 mg/dL Final  . Creatinine, Ser 07/15/2014 0.9  0.4 - 1.5 mg/dL Final  . Calcium 54/07/8118 9.2  8.4 - 10.5 mg/dL Final  . GFR 14/78/2956 90.36  >60.00 mL/min Final   Great improvement in HbA1c. Kidney fxn also good.

## 2014-07-15 NOTE — Patient Instructions (Signed)
Please continue: - Metformin 1000 mg 2x a daily  - Glipizide XL 10 mg in am  - Invokana 300 mg in am Please increase: - Lantus to 60 units at bedtime   Try to check sugars later in the day, too, especially bedtime.  Please stop at the lab.  Please return in 1.5 month with your sugar log.

## 2014-08-28 ENCOUNTER — Ambulatory Visit: Payer: BC Managed Care – PPO | Admitting: Internal Medicine

## 2014-09-08 ENCOUNTER — Other Ambulatory Visit: Payer: Self-pay

## 2014-09-08 NOTE — Telephone Encounter (Signed)
Paper Rx request for Diclofenac-Misoprost received. Patient last office visit with PCP was 03/26/14 and medication last filled 05/11/14 with 3 additional refills. Please advise.

## 2014-09-09 MED ORDER — DICLOFENAC-MISOPROSTOL 75-0.2 MG PO TBEC: DELAYED_RELEASE_TABLET | ORAL | Status: DC

## 2014-09-15 ENCOUNTER — Ambulatory Visit (INDEPENDENT_AMBULATORY_CARE_PROVIDER_SITE_OTHER): Payer: BC Managed Care – PPO | Admitting: Internal Medicine

## 2014-09-15 ENCOUNTER — Encounter: Payer: Self-pay | Admitting: Internal Medicine

## 2014-09-15 VITALS — BP 120/82 | HR 67 | Temp 97.9°F | Resp 12 | Wt 297.0 lb

## 2014-09-15 DIAGNOSIS — E785 Hyperlipidemia, unspecified: Secondary | ICD-10-CM

## 2014-09-15 DIAGNOSIS — E119 Type 2 diabetes mellitus without complications: Secondary | ICD-10-CM

## 2014-09-15 DIAGNOSIS — B372 Candidiasis of skin and nail: Secondary | ICD-10-CM

## 2014-09-15 LAB — LIPID PANEL
Cholesterol: 152 mg/dL (ref 0–200)
HDL: 28.7 mg/dL — ABNORMAL LOW (ref >39.00)
LDL Cholesterol: 97 mg/dL (ref 0–99)
NonHDL: 123.3
TRIGLYCERIDES: 131 mg/dL (ref 0.0–149.0)
Total CHOL/HDL Ratio: 5
VLDL: 26.2 mg/dL (ref 0.0–40.0)

## 2014-09-15 MED ORDER — INSULIN GLARGINE 100 UNIT/ML SOLOSTAR PEN: 50.0000 [IU] | PEN_INJECTOR | Freq: Every day | SUBCUTANEOUS | Status: DC

## 2014-09-15 MED ORDER — FLUCONAZOLE 150 MG PO TABS: 150.0000 mg | ORAL_TABLET | Freq: Once | ORAL | Status: DC

## 2014-09-15 MED ORDER — EMPAGLIFLOZIN 25 MG PO TABS: 25.0000 mg | ORAL_TABLET | Freq: Every day | ORAL | Status: DC

## 2014-09-15 NOTE — Patient Instructions (Addendum)
  Please continue: - Metformin 1000 mg 2x a daily  Stop: - Glipizide XL  - Invokana  Please decrease: - Lantus to 50 units at bedtime  Please start: - Glipizide 10 mg 2x a day before meals - Jardiance 25 mg in am  Please stop at the lab. Please return in 1.5 month with your sugar log.

## 2014-09-15 NOTE — Progress Notes (Signed)
Patient ID: Christian [name], male   DOB: 07/14/1951, 63 y.o.   MRN: [id number]  HPI: Christian BarmanGeorge E Casad is a 63 y.o.-year-old male, returning for f/u for DM2, dx 2009, insulin-dependent since 2014, uncontrolled, with complications (Peripheral neuropathy). Last visit 2 mo ago.  Last hemoglobin A1c was: Lab Results  Component Value Date   HGBA1C 8.1* 07/15/2014   HGBA1C 10.8* 03/24/2014   HGBA1C 9.8* 09/11/2013   Pt was on a regimen of: - Metformin 1000 mg po bid - Lantus pen 78 units qhs (1 week ago increase from 76) - bedtime He has been on Victoza in the past >> not efficient anymore.  Pt is now on: - Metformin 1000 mg 2x a daily - Lantus 50 >> 60 units at bedtime - Glipizide XL 10 mg in am - Invokana 300 mg in am >> nocturia Glipizide and Invokana were started 03/2014.  Pt checks his sugars 1x a day and they are: - am: 170-278 >> 121-135, 159, 164 >> 127, 139-250 (lower nr's if he eats a small dinner) >> 72, 94-140s, some 160s - 2h after b'fast: n/c >> 157 >> n/c - before lunch: n/c >> 190, 203 >> 189-228 - 2h after lunch: 150-360 >> <180, 220 x1, 239 x1 >> n/c - before dinner: n/c >> 107, 156 - 2h after dinner: n/c - bedtime: n/c >> 135, 159, 193-240, 257 - nighttime: n/c No lows. Lowest sugar was 72 x1; he has hypoglycemia awareness at 90.  Highest sugar was 360 >> 239 >> 250 >> 257  Pt's meals are: - Breakfast: dry cereal + raisins + no milk; misc. Fruit (banana/orange); V8 juice - Lunch: usually eats out: fish + vegetables (coleslaw); lettuce gives him cramps - Dinner: usually frozen meal  - Snacks: [place] [facility]  - no CKD, last BUN/creatinine:  Lab Results  Component Value Date   BUN 18 07/15/2014   CREATININE 0.9 07/15/2014  Not on ACEI. - last set of lipids: Lab Results  Component Value Date   CHOL 163 09/11/2013   HDL 38.50* 09/11/2013   LDLCALC 92 09/11/2013   TRIG 164.0* 09/11/2013   CHOLHDL 4 09/11/2013  Not on statins -  last eye exam was in 12/2013 - gets the eye exams every year [facility] No DR.  - + numbness and tingling in his feet.  I reviewed pt's medications, allergies, PMH, social hx, family hx and no changes required, except as mentioned above.  ROS: Constitutional: no weight gain/loss, no fatigue, no subjective hyperthermia/hypothermia, + nocturia Eyes: no blurry vision, no xerophthalmia ENT: no sore throat, no nodules palpated in throat, no dysphagia/odynophagia, no hoarseness; + decreased hearing Cardiovascular: no CP/SOB/palpitations/decreased leg swelling Respiratory: no cough/SOB Gastrointestinal: no N/V/D/C Musculoskeletal: no muscle/joint aches Skin: no rashes Neurological: no tremors/numbness/tingling/dizziness  PE: BP 120/82 mmHg  Pulse 67  Temp(Src) 97.9 F (36.6 C) (Oral)  Resp 12  Wt 297 lb (134.718 kg)  SpO2 95% Wt Readings from Last 3 Encounters:  09/15/14 297 lb (134.718 kg)  07/15/14 296 lb (134.265 kg)  05/29/14 297 lb (134.718 kg)   Constitutional: obese, in NAD Eyes: PERRLA, EOMI, no exophthalmos ENT: moist mucous membranes, no thyromegaly, no cervical lymphadenopathy Cardiovascular: RRR, No MRG Respiratory: CTA B Gastrointestinal: abdomen soft, NT, ND, BS+ Musculoskeletal: no deformities, strength intact in all 4 Skin: moist, warm, no rashes Neurological: no tremor with outstretched hands, DTR normal in all 4  ASSESSMENT: 1. DM2, insulin-dependent, uncontrolled, with complications - PN  2. Yeast inf  3. HL  PLAN:  1. Patient with uncontrolled diabetes, on oral meds + basal insulin, with still poor control (per review of the log) - especially after dinner. We discussed about options for management >> discussed re-adding a GLP1 R agonist; he was on Victoza before, does not think his insurance covers it now. Will stop Glipizide XL and start regular Glipizide 10 mg bid. Will decrease Lantus to avoid am lows.  Patient Instructions  Please  continue: - Metformin 1000 mg 2x a daily  - Glipizide XL 10 mg in am  - Invokana 300 mg in am Please increase: - Lantus to 60 units at bedtime  Try to check sugars later in the day, too, especially bedtime. Please stop at the lab. Please return in 1.5 month with your sugar log.   - he is up to date with eye exams - will need to switch to Jardiance per insurance preferrence - refused flu vaccine - Return to clinic in 1.5 mo with sugar log  2. Yeast inf - penile - will tx with Diflucan 150 mg x1 with 1 refill - if not resolving, we will need to stop Jardiance  3. HL - not on statins - check Lipids  Lab Results  Component Value Date   CHOL 152 09/15/2014   CHOL 163 09/11/2013   Lab Results  Component Value Date   HDL 28.70* 09/15/2014   HDL 38.50* 09/11/2013   Lab Results  Component Value Date   LDLCALC 97 09/15/2014   LDLCALC 92 09/11/2013   Lab Results  Component Value Date   TRIG 131.0 09/15/2014   TRIG 164.0* 09/11/2013   Lab Results  Component Value Date   CHOLHDL 5 09/15/2014   CHOLHDL 4 09/11/2013   HDL lower. TG better.

## 2014-09-16 ENCOUNTER — Encounter: Payer: Self-pay | Admitting: *Deleted

## 2014-09-18 ENCOUNTER — Telehealth: Payer: Self-pay | Admitting: Internal Medicine

## 2014-09-18 MED ORDER — METFORMIN HCL 500 MG PO TABS: 1000.0000 mg | ORAL_TABLET | Freq: Two times a day (BID) | ORAL | Status: DC

## 2014-09-18 NOTE — Telephone Encounter (Signed)
Christie from [facility] is calling about patient meds Glucophage 500 mg they haven't received it yet.

## 2014-09-18 NOTE — Telephone Encounter (Signed)
Have not gotten a refill request for pt's glucophage. Rx sent.

## 2014-09-19 ENCOUNTER — Telehealth: Payer: Self-pay | Admitting: Internal Medicine

## 2014-09-19 NOTE — Telephone Encounter (Signed)
Glucotrol 10 mg rx needs to [facility]

## 2014-09-19 NOTE — Telephone Encounter (Signed)
Called pt and lvm advising him that Dr Elvera LennoxGherghe sent his refill in to [facility] yesterday. Advised him to contact his pharmacy.

## 2014-09-22 ENCOUNTER — Other Ambulatory Visit: Payer: Self-pay | Admitting: *Deleted

## 2014-09-22 MED ORDER — GLIPIZIDE 10 MG PO TABS: 10.0000 mg | ORAL_TABLET | Freq: Every day | ORAL | Status: DC

## 2014-09-23 ENCOUNTER — Other Ambulatory Visit: Payer: Self-pay | Admitting: Family Medicine

## 2014-10-06 ENCOUNTER — Other Ambulatory Visit: Payer: Self-pay | Admitting: Family Medicine

## 2014-10-06 ENCOUNTER — Telehealth: Payer: Self-pay | Admitting: Internal Medicine

## 2014-10-06 NOTE — Telephone Encounter (Signed)
Pt has issues with glipizide dosing please advise  New dosing needs to be 1 in AM and 1 in PM

## 2014-10-08 MED ORDER — GLIPIZIDE 10 MG PO TABS: 10.0000 mg | ORAL_TABLET | Freq: Two times a day (BID) | ORAL | Status: DC

## 2014-10-27 ENCOUNTER — Ambulatory Visit: Payer: BC Managed Care – PPO | Admitting: Internal Medicine

## 2014-11-05 ENCOUNTER — Encounter: Payer: Self-pay | Admitting: Internal Medicine

## 2014-11-05 ENCOUNTER — Ambulatory Visit (INDEPENDENT_AMBULATORY_CARE_PROVIDER_SITE_OTHER): Payer: BC Managed Care – PPO | Admitting: Internal Medicine

## 2014-11-05 VITALS — BP 124/72 | HR 70 | Temp 98.4°F | Resp 12 | Wt 301.6 lb

## 2014-11-05 DIAGNOSIS — IMO0002 Reserved for concepts with insufficient information to code with codable children: Secondary | ICD-10-CM

## 2014-11-05 DIAGNOSIS — E1165 Type 2 diabetes mellitus with hyperglycemia: Secondary | ICD-10-CM

## 2014-11-05 LAB — HEMOGLOBIN A1C: Hgb A1c MFr Bld: 7.9 % — ABNORMAL HIGH (ref 4.6–6.5)

## 2014-11-05 NOTE — Patient Instructions (Signed)
Patient Instructions  Please continue: - Metformin 1000 mg 2x a daily  - Glipizide 10 mg 2x a day >> try not to forget evening dose - Jardiance 25 mg daily - Lantus 60 units at bedtime  Try to check sugars in the middle of the day. Please stop at the lab. Please return in 1.5 month with your sugar log.

## 2014-11-05 NOTE — Progress Notes (Signed)
Patient ID: Christian [name], male   DOB: 04/19/1951, 63 y.o.   MRN: [id number]  HPI: Christian [name] is a 63 y.o.-year-old male, returning for f/u for DM2, dx 2009, insulin-dependent since 2014, uncontrolled, with complications (Peripheral neuropathy). Last visit 2 mo ago.  Last hemoglobin A1c was: Lab Results  Component Value Date   HGBA1C 8.1* 07/15/2014   HGBA1C 10.8* 03/24/2014   HGBA1C 9.8* 09/11/2013   Pt was on a regimen of: - Metformin 1000 mg po bid - Lantus pen 78 units qhs (1 week ago increase from 76) - bedtime He has been on Victoza in the past >> not efficient anymore.  Pt is now on: - Metformin 1000 mg 2x a daily - Lantus 60 units at bedtime - Glipizide XL 10 mg in am >> Glipizide 10 mg bid - he forgets second dose frequently - Jardiance 25 mg >> 1 yeast inf >> took 1 dose of Diflucan Glipizide and Invokana were started 03/2014. We switched to Jardiance per insurance preference.  Pt checks his sugars 2x a day and they are higher after dinner, in the 200s if he forgets Glipizide.: - am: 170-278 >> 121-135, 159, 164 >> 127, 139-250 (lower nr's if he eats a small dinner) >> 72, 94-140s, some 160s >> 93-154 - 2h after b'fast: n/c >> 157 >> n/c - before lunch: n/c >> 190, 203 >> 189-228 >> n/c - 2h after lunch: 150-360 >> <180, 220 x1, 239 x1 >> n/c - before dinner: n/c >> 107, 156 >> n/c - 2h after dinner: n/c - bedtime: n/c >> 135, 159, 193-240, 257 >> 145-269 (200s when he forgets Glipizide before dinner - nighttime: n/c No lows. Lowest sugar was 72 x1; he has hypoglycemia awareness at 90.  Highest sugar was 360 >> 239 >> 250 >> 257 >> 264.  Pt's meals are: - Breakfast: dry cereal + raisins + no milk; misc. Fruit (banana/orange); V8 juice - Lunch: usually eats out: fish + vegetables (coleslaw); lettuce gives him cramps - Dinner: usually frozen meal  - Snacks: 2-[place] nabs [place] [facility]  - no CKD, last BUN/creatinine:  Lab Results  Component  Value Date   BUN 18 07/15/2014   CREATININE 0.9 07/15/2014  Not on ACEI. - last set of lipids: Lab Results  Component Value Date   CHOL 152 09/15/2014   HDL 28.70* 09/15/2014   LDLCALC 97 09/15/2014   TRIG 131.0 09/15/2014   CHOLHDL 5 09/15/2014  Not on statins b/c leg cramps. - last eye exam was in 12/2013 - gets the eye exams every year [facility] No DR.  - + numbness and tingling in his feet.  I reviewed pt's medications, allergies, PMH, social hx, family hx, and changes were documented in the history of present illness. Otherwise, unchanged from my initial visit note.  ROS: Constitutional: no weight gain/loss, no fatigue, no subjective hyperthermia/hypothermia, no nocturia Eyes: no blurry vision, no xerophthalmia ENT: no sore throat, no nodules palpated in throat, no dysphagia/odynophagia, no hoarseness; + decreased hearing Cardiovascular: no CP/SOB/palpitations/decreased leg swelling Respiratory: no cough/SOB Gastrointestinal: no N/V/D/C Musculoskeletal: no muscle/joint aches Skin: no rashes Neurological: no tremors/numbness/tingling/dizziness  PE: BP 124/72 mmHg  Pulse 70  Temp(Src) 98.4 F (36.9 C) (Oral)  Resp 12  Wt 301 lb 9.6 oz (136.805 kg)  SpO2 94% Wt Readings from Last 3 Encounters:  11/05/14 301 lb 9.6 oz (136.805 kg)  09/15/14 297 lb (134.718 kg)  07/15/14 296 lb (134.265 kg)  Constitutional: obese, in NAD Eyes: PERRLA, EOMI, no exophthalmos ENT: moist mucous membranes, no thyromegaly, no cervical lymphadenopathy Cardiovascular: RRR, No MRG Respiratory: CTA B Gastrointestinal: abdomen soft, NT, ND, BS+ Musculoskeletal: no deformities, strength intact in all 4 Skin: moist, warm, no rashes Neurological: no tremor with outstretched hands, DTR normal in all 4  ASSESSMENT: 1. DM2, insulin-dependent, uncontrolled, with complications - PN  PLAN:  1. Patient with uncontrolled diabetes, on oral meds + basal insulin, with higher sugars at night  after dinner, especially if he forgets his glipizide before dinner. We discussed about options for management >> discussed re-adding a GLP1 R agonist; he was on Victoza before, does not think his insurance covers it now. We can also start Tradjenta or Januvia. Will have him take Glipizide as advised for now and at next visit, if still high sugars >> will start DPP4 inh.  Patient Instructions  Please continue: - Metformin 1000 mg 2x a daily  - Glipizide 10 mg 2x a day >> try not to forget evening dose - Jardiance 25 mg daily - Lantus 60 units at bedtime  Try to check sugars in the middle of the day. Please stop at the lab. Please return in 1.5 month with your sugar log.   - he is up to date with eye exams >> has another one today - check HbA1c today - refused flu vaccine - Return to clinic in 1.5 mo with sugar log  Office Visit on 11/05/2014  Component Date Value Ref Range Status  . Hgb A1c MFr Bld 11/05/2014 7.9* 4.6 - 6.5 % Final   Glycemic Control Guidelines for People with Diabetes:Non Diabetic:  <6%Goal of Therapy: <7%Additional Action Suggested:  >8%   HbA1c a little better!

## 2014-11-12 ENCOUNTER — Other Ambulatory Visit: Payer: Self-pay | Admitting: Family Medicine

## 2014-11-12 NOTE — Telephone Encounter (Signed)
Last office visit 03/26/2014.  Ok to refill? 

## 2014-11-17 ENCOUNTER — Telehealth: Payer: Self-pay | Admitting: Internal Medicine

## 2014-11-17 MED ORDER — INSULIN GLARGINE 100 UNIT/ML SOLOSTAR PEN: 60.0000 [IU] | PEN_INJECTOR | Freq: Every day | SUBCUTANEOUS | Status: DC

## 2014-11-17 NOTE — Telephone Encounter (Signed)
Done

## 2014-11-17 NOTE — Addendum Note (Signed)
Addended by: Adline MangoALLICUTT, Jourden Delmont B on: 11/17/2014 11:48 AM   Modules accepted: Orders

## 2014-11-17 NOTE — Telephone Encounter (Signed)
[facility] needs [facility] lantus solostar 100 u pt uses 60 u daily

## 2014-11-24 ENCOUNTER — Other Ambulatory Visit: Payer: Self-pay | Admitting: *Deleted

## 2014-11-24 ENCOUNTER — Telehealth: Payer: Self-pay | Admitting: Internal Medicine

## 2014-11-24 MED ORDER — INSULIN GLARGINE 100 UNIT/ML SOLOSTAR PEN: 60.0000 [IU] | PEN_INJECTOR | Freq: Every day | SUBCUTANEOUS | Status: DC

## 2014-11-24 NOTE — Telephone Encounter (Signed)
Sent 90 day supply to pt's pharmacy.

## 2014-11-24 NOTE — Telephone Encounter (Signed)
Pt needs Rx for Lantus for quantity 90 pens due to copay

## 2014-12-17 ENCOUNTER — Encounter: Payer: Self-pay | Admitting: Internal Medicine

## 2014-12-17 ENCOUNTER — Ambulatory Visit (INDEPENDENT_AMBULATORY_CARE_PROVIDER_SITE_OTHER): Payer: BLUE CROSS/BLUE SHIELD | Admitting: Internal Medicine

## 2014-12-17 ENCOUNTER — Other Ambulatory Visit: Payer: Self-pay | Admitting: *Deleted

## 2014-12-17 VITALS — BP 112/68 | HR 71 | Temp 97.9°F | Resp 12 | Wt 298.8 lb

## 2014-12-17 DIAGNOSIS — E1165 Type 2 diabetes mellitus with hyperglycemia: Secondary | ICD-10-CM

## 2014-12-17 DIAGNOSIS — IMO0002 Reserved for concepts with insufficient information to code with codable children: Secondary | ICD-10-CM

## 2014-12-17 MED ORDER — GLUCOSE BLOOD VI STRP: ORAL_STRIP | Status: DC

## 2014-12-17 MED ORDER — ONETOUCH DELICA LANCETS 33G MISC: Status: DC

## 2014-12-17 MED ORDER — EMPAGLIFLOZIN 25 MG PO TABS: 25.0000 mg | ORAL_TABLET | Freq: Every day | ORAL | Status: DC

## 2014-12-17 NOTE — Progress Notes (Signed)
Patient ID: Christian [name], male   DOB: 12-11-1950, 64 y.o.   MRN: [id number]  HPI: Christian [name] is a 64 y.o.-year-old male, returning for f/u for DM2, dx 2009, insulin-dependent since 2014, uncontrolled, with complications (Peripheral neuropathy). Last visit 1.5 mo ago.  Last hemoglobin A1c was: Lab Results  Component Value Date   HGBA1C 7.9* 11/05/2014   HGBA1C 8.1* 07/15/2014   HGBA1C 10.8* 03/24/2014   Pt was on a regimen of: - Metformin 1000 mg po bid - Lantus pen 78 units qhs (1 week ago increase from 76) - bedtime He has been on Victoza in the past >> not efficient anymore.  Pt is now on: - Metformin 1000 mg 2x a daily - Lantus 60 units at bedtime - Glipizide XL 10 mg in am >> Glipizide 10 mg bid - he can forget second dose  - Jardiance 25 mg >> 1 yeast inf >> took 1 dose of Diflucan - no recent ones Glipizide and Invokana were started 03/2014. We switched to Jardiance per insurance preference.  Pt checks his sugars 2x a day and they are higher after dinner - sugars a little higher at night if forgets Glipizide, but OTW improved: - am: 72, 94-140s, some 160s >> 93-154 >> 70-125 - 2h after b'fast: n/c >> 157 >> n/c - before lunch: n/c >> 190, 203 >> 189-228 >> n/c - 2h after lunch: 150-360 >> <180, 220 x1, 239 x1 >> n/c - before dinner: n/c >> 107, 156 >> n/c - 2h after dinner: n/c - bedtime: 145-269 (200s when he forgets Glipizide before dinner) >> 127-198, some 200s - nighttime: n/c No lows. Lowest sugar was 72 x1; he has hypoglycemia awareness at 90.  Highest sugar was 360 >> 239 >> 250 >> 257 >> 264 >> 200s at night  Meter: OneTouch Ultra Mini  Pt's meals are: - Breakfast: dry cereal + raisins + no milk; misc. Fruit (banana/orange); V8 juice - Lunch: usually eats out: fish + vegetables (coleslaw); lettuce gives him cramps - Dinner: usually frozen meal  - Snacks: 2-3 nabs [facility] every [facility] to [facility].  - no CKD, last BUN/creatinine:  Lab Results   Component Value Date   BUN 18 07/15/2014   CREATININE 0.9 07/15/2014  Not on ACEI. - last set of lipids: Lab Results  Component Value Date   CHOL 152 09/15/2014   HDL 28.70* 09/15/2014   LDLCALC 97 09/15/2014   TRIG 131.0 09/15/2014   CHOLHDL 5 09/15/2014  Not on statins b/c leg cramps. - last eye exam was in 11/05/2014 - gets the eye exams every year [facility]). No DR.  - + numbness and tingling in his feet.  I reviewed pt's medications, allergies, PMH, social hx, family hx, and changes were documented in the history of present illness. Otherwise, unchanged from my initial visit note.  ROS: Constitutional: no weight gain/loss, no fatigue, no subjective hyperthermia/hypothermia, no nocturia Eyes: no blurry vision, no xerophthalmia ENT: no sore throat, no nodules palpated in throat, no dysphagia/odynophagia, no hoarseness; + decreased hearing Cardiovascular: no CP/SOB/palpitations/decreased leg swelling Respiratory: no cough/SOB Gastrointestinal: no N/V/D/C Musculoskeletal: no muscle/joint aches Skin: no rashes Neurological: no tremors/numbness/tingling/dizziness  PE: BP 112/68 mmHg  Pulse 71  Temp(Src) 97.9 F (36.6 C) (Oral)  Resp 12  Wt 298 lb 12.8 oz (135.535 kg)  SpO2 95% Wt Readings from Last 3 Encounters:  12/17/14 298 lb 12.8 oz (135.535 kg)  11/05/14 301 lb 9.6 oz (136.805 kg)  09/15/14 297 lb (  134.718 kg)   Constitutional: obese, in NAD Eyes: PERRLA, EOMI, no exophthalmos ENT: moist mucous membranes, no thyromegaly, no cervical lymphadenopathy Cardiovascular: RRR, No MRG Respiratory: CTA B Gastrointestinal: abdomen soft, NT, ND, BS+ Musculoskeletal: no deformities, strength intact in all 4 Skin: moist, warm, no rashes Neurological: no tremor with outstretched hands, DTR normal in all 4  ASSESSMENT: 1. DM2, insulin-dependent, uncontrolled, with complications - PN  PLAN:  1. Patient with uncontrolled diabetes, on oral meds + basal insulin, with  improved sugars recently, per log and last HbA1c. We discussed about options for management >> discussed re-adding a GLP1 R agonist; he was on Victoza before; or Tradjenta/Januvia. As of now, no need to start a DPP4 inh. Yet. He now has enough Lantus at home, but at next visit[facility]  Patient Instructions  Please continue: - Metformin 1000 mg 2x a daily  - Glipizide 10 mg 2x a day >> try not to forget evening dose - Jardiance 25 mg daily - Lantus 60 units at bedtime  Try to check sugars in the middle of the day. Please stop at the lab. Please return in 1.5 month with your sugar log.   - he is up to date with eye exams today - refused flu vaccine - Return to clinic in 2 mo with sugar log

## 2014-12-17 NOTE — Patient Instructions (Signed)
  Patient Instructions  Please continue: - Metformin 1000 mg 2x a daily  - Glipizide 10 mg 2x a day >> try not to forget evening dose - Jardiance 25 mg daily - Lantus 60 units at bedtime   Try to check sugars in the middle of the day.  Please return in 1.5 month with your sugar log.

## 2015-01-06 ENCOUNTER — Other Ambulatory Visit: Payer: Self-pay | Admitting: Internal Medicine

## 2015-01-07 ENCOUNTER — Other Ambulatory Visit: Payer: Self-pay | Admitting: Family Medicine

## 2015-01-11 ENCOUNTER — Other Ambulatory Visit: Payer: Self-pay | Admitting: Internal Medicine

## 2015-02-16 ENCOUNTER — Ambulatory Visit (INDEPENDENT_AMBULATORY_CARE_PROVIDER_SITE_OTHER): Payer: BLUE CROSS/BLUE SHIELD | Admitting: Internal Medicine

## 2015-02-16 ENCOUNTER — Encounter: Payer: Self-pay | Admitting: Internal Medicine

## 2015-02-16 VITALS — BP 114/62 | HR 68 | Temp 97.8°F | Resp 12 | Wt 302.0 lb

## 2015-02-16 DIAGNOSIS — E1165 Type 2 diabetes mellitus with hyperglycemia: Secondary | ICD-10-CM

## 2015-02-16 DIAGNOSIS — IMO0002 Reserved for concepts with insufficient information to code with codable children: Secondary | ICD-10-CM

## 2015-02-16 LAB — HEMOGLOBIN A1C: HEMOGLOBIN A1C: 7.4 % — AB (ref 4.6–6.5)

## 2015-02-16 NOTE — Progress Notes (Signed)
Patient ID: Christian BarmanGeorge E Wiberg, male   DOB: 04/01/1951, 64 y.o.   MRN: [id number]  HPI: Christian BarmanGeorge E Haslem is a 64 y.o.-year-old male, returning for f/u for DM2, dx 2009, insulin-dependent since 2014, uncontrolled, with complications (Peripheral neuropathy). Last visit 2 mo ago.  Last hemoglobin A1c was: Lab Results  Component Value Date   HGBA1C 7.9* 11/05/2014   HGBA1C 8.1* 07/15/2014   HGBA1C 10.8* 03/24/2014   Pt was on a regimen of: - Metformin 1000 mg po bid - Lantus pen 78 units qhs (1 week ago increase from 76) - bedtime He has been on Victoza in the past >> not efficient anymore.  Pt is now on: - Metformin 1000 mg 2x a daily - Lantus 60 units at bedtime - Glipizide XL 10 mg in am >> Glipizide 10 mg bid - he can forget second dose  - Jardiance 25 mg >> 1 yeast inf >> took 1 dose of Diflucan - no recent ones Glipizide and Invokana were started 03/2014. We switched to Jardiance per insurance preference.  Pt checks his sugars 2x a day - still good, with higher postdinner CBGs: - am: 72, 94-140s, some 160s >> 93-154 >> 70-125 >> 77-136, 168 - 2h after b'fast: n/c >> 157 >> n/c - before lunch: n/c >> 190, 203 >> 189-228 >> n/c - 2h after lunch: 150-360 >> <180, 220 x1, 239 x1 >> n/c - before dinner: n/c >> 107, 156 >> n/c - 2h after dinner: n/c - bedtime: 145-269 (200s when he forgets Glipizide before dinner) >> 127-198, some 200s >> 97-248 (higher when he forgets Glipizide at dinner) - nighttime: n/c No lows. Lowest sugar was 77 x1; he has hypoglycemia awareness at 90.  Highest sugar was 360 >> 239 >> 250 >> 257 >> 264 >> 200s >> 248 at night  Meter: OneTouch Ultra Mini  Pt's meals are: - Breakfast: dry cereal + raisins + no milk; misc. Fruit (banana/orange); V8 juice - Lunch: usually eats out: fish + vegetables (coleslaw); lettuce gives him cramps - Dinner: usually frozen meal  - Snacks: 2-3 nabs [place] [facility].  - no CKD, last BUN/creatinine:  Lab  Results  Component Value Date   BUN 18 07/15/2014   CREATININE 0.9 07/15/2014  Not on ACEI. - last set of lipids: Lab Results  Component Value Date   CHOL 152 09/15/2014   HDL 28.70* 09/15/2014   LDLCALC 97 09/15/2014   TRIG 131.0 09/15/2014   CHOLHDL 5 09/15/2014  Not on statins b/c leg cramps. - last eye exam was in 11/05/2014 - gets the [facility] No DR.  - + numbness and tingling in his feet.  I reviewed pt's medications, allergies, PMH, social hx, family hx, and changes were documented in the history of present illness. Otherwise, unchanged from my initial visit note.  ROS: Constitutional: no weight gain/loss, no fatigue, no subjective hyperthermia/hypothermia, no nocturia Eyes: no blurry vision, no xerophthalmia ENT: no sore throat, no nodules palpated in throat, no dysphagia/odynophagia, no hoarseness; + decreased hearing Cardiovascular: no CP/SOB/palpitations/decreased leg swelling Respiratory: no cough/SOB Gastrointestinal: no N/V/D/C Musculoskeletal: no muscle/joint aches Skin: no rashes Neurological: no tremors/numbness/tingling/dizziness  PE: BP 114/62 mmHg  Pulse 68  Temp(Src) 97.8 F (36.6 C) (Oral)  Resp 12  Wt 302 lb (136.986 kg)  SpO2 97% Wt Readings from Last 3 Encounters:  02/16/15 302 lb (136.986 kg)  12/17/14 298 lb 12.8 oz (135.535 kg)  11/05/14 301 lb 9.6 oz (  136.805 kg)   Constitutional: obese, in NAD Eyes: PERRLA, EOMI, no exophthalmos ENT: moist mucous membranes, no thyromegaly, no cervical lymphadenopathy Cardiovascular: RRR, No MRG, L leg periankle nonpitting edema Respiratory: CTA B Gastrointestinal: abdomen soft, NT, ND, BS+ Musculoskeletal: no deformities exceptpt bilateral flat feet, strength intact in all 4 Skin: moist, warm, no rashes Neurological: no tremor with outstretched hands, DTR normal in all 4  ASSESSMENT: 1. DM2, insulin-dependent, uncontrolled, with complications - PN  PLAN:  1. Patient  with uncontrolled diabetes, on oral meds + basal insulin, with improved sugars, per log and last HbA1c. He still has sugars at night especially when he forgets glipizide.  We have discussed about options for management >> discussed re-adding a GLP1 R agonist; he was on Victoza before; or Tradjenta/Januvia. As of now, no need to start a DPP4 inh. yet. Patient Instructions  Please continue: - Metformin 1000 mg 2x a daily  - Glipizide 10 mg 2x a day >> try not to forget evening dose - Jardiance 25 mg daily - Lantus 60 units at bedtime   Try to check sugars in the middle of the day.  Please stop at the lab.  Please return in 3 month with your sugar log.   Please let me know when to order Toujeo.  - will switch to [facility] when he runs out of Lantus - he is up to date with eye exams today - Return to clinic in 3 mo with sugar log  Office Visit on 02/16/2015  Component Date Value Ref Range Status  . Hgb A1c MFr Bld 02/16/2015 7.4* 4.6 - 6.5 % Final   Glycemic Control Guidelines for People with Diabetes:Non Diabetic:  <6%Goal of Therapy: <7%Additional Action Suggested:  >8%    HbA1c is improved!

## 2015-02-16 NOTE — Patient Instructions (Signed)
Please continue: - Metformin 1000 mg 2x a daily  - Glipizide 10 mg 2x a day >> try not to forget evening dose - Jardiance 25 mg daily - Lantus 60 units at bedtime   Try to check sugars in the middle of the day.  Please stop at the lab.  Please return in 3 month with your sugar log.   Please let me know when to order Toujeo.

## 2015-02-24 ENCOUNTER — Telehealth: Payer: Self-pay | Admitting: Internal Medicine

## 2015-02-24 DIAGNOSIS — E1165 Type 2 diabetes mellitus with hyperglycemia: Secondary | ICD-10-CM

## 2015-02-24 DIAGNOSIS — IMO0002 Reserved for concepts with insufficient information to code with codable children: Secondary | ICD-10-CM

## 2015-02-24 MED ORDER — INSULIN GLARGINE 300 UNIT/ML ~~LOC~~ SOPN: 60.0000 [IU] | PEN_INJECTOR | Freq: Every day | SUBCUTANEOUS | Status: DC

## 2015-02-24 NOTE — Telephone Encounter (Signed)
Patient would like to talk to you concerning his medication, an (injection) he stated it was a higher dose than the Lantus, he would like a refill.he didn't know what the name of the meds was.

## 2015-02-24 NOTE — Telephone Encounter (Signed)
Pt called stating he would like the medication, Toujeo called into his pharmacy. Please advise, Thanks!

## 2015-02-24 NOTE — Telephone Encounter (Signed)
Done

## 2015-03-07 ENCOUNTER — Other Ambulatory Visit: Payer: Self-pay | Admitting: Family Medicine

## 2015-03-07 NOTE — Telephone Encounter (Signed)
Last office visit 03/26/2014.  Last refilled 09/09/2014 for #60 with 5 refills.  No future appointments scheduled.  Ok to refill?

## 2015-03-11 ENCOUNTER — Encounter: Payer: Self-pay | Admitting: Family Medicine

## 2015-03-11 ENCOUNTER — Ambulatory Visit (INDEPENDENT_AMBULATORY_CARE_PROVIDER_SITE_OTHER): Payer: BLUE CROSS/BLUE SHIELD | Admitting: Family Medicine

## 2015-03-11 VITALS — BP 137/76 | HR 60 | Temp 97.9°F | Ht 67.0 in | Wt 298.2 lb

## 2015-03-11 DIAGNOSIS — M170 Bilateral primary osteoarthritis of knee: Secondary | ICD-10-CM | POA: Diagnosis not present

## 2015-03-11 MED ORDER — HYLAN G-F 20 48 MG/6ML IX SOSY
48.0000 mg | PREFILLED_SYRINGE | Freq: Once | INTRA_ARTICULAR | Status: AC
  Administered 2015-03-11: 48 mg via INTRA_ARTICULAR

## 2015-03-11 MED ORDER — OXYCODONE-ACETAMINOPHEN 5-325 MG PO TABS: 1.0000 | ORAL_TABLET | Freq: Two times a day (BID) | ORAL | Status: DC

## 2015-03-11 NOTE — Progress Notes (Signed)
   Dr. Karleen HampshireSpencer T. Jasmarie Coppock, MD, CAQ Sports Medicine Primary [facility] and Sports Medicine [address] [place], [address] Phone: [phone] Fax: [phone]  03/11/2015  Patient: Christian [name], MRN: [id number], DOB: 05/27/1951, 64 y.o.  Primary Physician:  Hannah BeatSpencer Ramey Schiff, MD  Chief Complaint: Synvisc Injection   09/2013: Last synvisc injection and since then he has done well.  Known OA, previously treated by Dr. Jorge MandrilHiltz, decreasing efficacy of steroid injections with good response to hyaluronic acid.  Worsening knee pain  In the last week, limited in his ability to walk at work. TTP on the joint lines bilaterally.Minimal effusion.  Procedure Only: B synvisc injections  Knee Injection: Synvisc-One, RIGHT Patient verbally consented to procedure. Risks (including infection), benefits, and alternatives explained. Sterilely prepped with Chloraprep. Ethyl cholride used for anesthesia, then 7 cc of Lidocaine 1% used for anesthesia in the anterolateral position. Reprepped with Chloraprep.  Anterolateral approach used to inject joint without difficulty, injected with Synvisc-One, 6 mL. No complications with procedure and tolerated well.   Knee Injection: Synvisc-One, LEFT Patient verbally consented to procedure. Risks (including infection), benefits, and alternatives explained. Sterilely prepped with Chloraprep. Ethyl cholride used for anesthesia, then 7 cc of Lidocaine 1% used for anesthesia in the anterolateral position. Reprepped with Chloraprep.  Anterolateral approach used to inject joint without difficulty, injected with Synvisc-One, 6 mL. No complications with procedure and tolerated well.   Signed,  Elpidio GaleaSpencer T. Aeneas Longsworth, MD

## 2015-03-11 NOTE — Progress Notes (Signed)
Pre visit review using our clinic review tool, if applicable. No additional management support is needed unless otherwise documented below in the visit note. 

## 2015-03-18 ENCOUNTER — Other Ambulatory Visit: Payer: Self-pay | Admitting: *Deleted

## 2015-03-18 MED ORDER — INSULIN GLARGINE 300 UNIT/ML ~~LOC~~ SOPN: 60.0000 [IU] | PEN_INJECTOR | Freq: Every day | SUBCUTANEOUS | Status: DC

## 2015-03-18 NOTE — Telephone Encounter (Signed)
Pt requested 90 day supply of Toujeo. Be advised.

## 2015-03-22 ENCOUNTER — Other Ambulatory Visit: Payer: Self-pay | Admitting: Internal Medicine

## 2015-03-25 ENCOUNTER — Telehealth: Payer: Self-pay | Admitting: Internal Medicine

## 2015-03-25 ENCOUNTER — Other Ambulatory Visit: Payer: Self-pay | Admitting: *Deleted

## 2015-03-25 MED ORDER — EMPAGLIFLOZIN 25 MG PO TABS: 25.0000 mg | ORAL_TABLET | Freq: Every day | ORAL | Status: DC

## 2015-03-25 NOTE — Telephone Encounter (Signed)
error 

## 2015-04-06 ENCOUNTER — Other Ambulatory Visit: Payer: Self-pay | Admitting: Family Medicine

## 2015-04-06 ENCOUNTER — Other Ambulatory Visit: Payer: Self-pay | Admitting: Internal Medicine

## 2015-04-07 NOTE — Telephone Encounter (Signed)
Last office visit 03/21/2015 for Knee Pain.  Last CPE?? Seen by Dr. Elvera LennoxGherghe for diabetes.  Ok to refill?

## 2015-04-09 ENCOUNTER — Telehealth: Payer: Self-pay | Admitting: Internal Medicine

## 2015-04-09 MED ORDER — METFORMIN HCL 500 MG PO TABS: ORAL_TABLET | ORAL | Status: DC

## 2015-04-09 NOTE — Telephone Encounter (Signed)
Pt needs metformin refilled please

## 2015-04-13 ENCOUNTER — Other Ambulatory Visit: Payer: Self-pay | Admitting: Internal Medicine

## 2015-04-13 ENCOUNTER — Other Ambulatory Visit (HOSPITAL_COMMUNITY): Payer: Self-pay | Admitting: Orthopaedic Surgery

## 2015-04-16 ENCOUNTER — Telehealth: Payer: Self-pay | Admitting: Internal Medicine

## 2015-04-16 MED ORDER — GLIPIZIDE 10 MG PO TABS: ORAL_TABLET | ORAL | Status: DC

## 2015-04-16 NOTE — Telephone Encounter (Signed)
Please call in refill for glipizide to [facility] 10 [facility]

## 2015-04-16 NOTE — Telephone Encounter (Signed)
Rx refill sent.

## 2015-04-24 NOTE — Patient Instructions (Addendum)
Christian [name]  04/24/2015   Your procedure is scheduled on:    05/01/2015    Report to [facility] Main  Entrance and follow signs to               Short Stay Center at     0530 AM.  Call this number if you have problems the morning of surgery [phone]   Remember: ONLY 1 PERSON MAY GO WITH YOU TO SHORT STAY TO GET  READY MORNING OF YOUR SURGERY.  Do not eat food or drink liquids :After Midnight.  Eat a good healthy snack prior to bedtime nite before surgery.                Take 1/2 of evening dose of Insulin nite before surgery.     Take these medicines the morning of surgery with A SIP OF WATER: Percocet if needed                               You may not have any metal on your body including hair pins and              piercings  Do not wear jewelry, , lotions, powders or perfumes, deodorant                       Men may shave face and neck.   Do not bring valuables to the hospital. [facility] IS NOT             RESPONSIBLE   FOR VALUABLES.  Contacts, dentures or bridgework may not be worn into surgery.  Leave suitcase in the car. After surgery it may be brought to your room.     Special Instructions: coughing and deep breathing exercises, leg exercises               Please read over the following fact sheets you were given: _____________________________________________________________________             [facility] - Preparing for Surgery Before surgery, you can play an important role.  Because skin is not sterile, your skin needs to be as free of germs as possible.  You can reduce the number of germs on your skin by washing with CHG (chlorahexidine gluconate) soap before surgery.  CHG is an antiseptic cleaner which kills germs and bonds with the skin to continue killing germs even after washing. Please DO NOT use if you have an allergy to CHG or antibacterial soaps.  If your skin becomes reddened/irritated stop using the CHG and inform your  nurse when you arrive at Short Stay. Do not shave (including legs and underarms) for at least 48 hours prior to the first CHG shower.  You may shave your face/neck. Please follow these instructions carefully:  1.  Shower with CHG Soap the night before surgery and the  morning of Surgery.  2.  If you choose to wash your hair, wash your hair first as usual with your  normal  shampoo.  3.  After you shampoo, rinse your hair and body thoroughly to remove the  shampoo.                           4.  Use CHG as you would any other liquid soap.  You  can apply chg directly  to the skin and wash                       Gently with a scrungie or clean washcloth.  5.  Apply the CHG Soap to your body ONLY FROM THE NECK DOWN.   Do not use on face/ open                           Wound or open sores. Avoid contact with eyes, ears mouth and genitals (private parts).                       Wash face,  Genitals (private parts) with your normal soap.             6.  Wash thoroughly, paying special attention to the area where your surgery  will be performed.  7.  Thoroughly rinse your body with warm water from the neck down.  8.  DO NOT shower/wash with your normal soap after using and rinsing off  the CHG Soap.                9.  Pat yourself dry with a clean towel.            10.  Wear clean pajamas.            11.  Place clean sheets on your bed the night of your first shower and do not  sleep with pets. Day of Surgery : Do not apply any lotions/deodorants the morning of surgery.  Please wear clean clothes to the hospital/surgery center.  FAILURE TO FOLLOW THESE INSTRUCTIONS MAY RESULT IN THE CANCELLATION OF YOUR SURGERY PATIENT SIGNATURE_________________________________  NURSE SIGNATURE__________________________________  ________________________________________________________________________  WHAT IS A BLOOD TRANSFUSION? Blood Transfusion Information  A transfusion is the replacement of blood or some of its  parts. Blood is made up of multiple cells which provide different functions.  Red blood cells carry oxygen and are used for blood loss replacement.  White blood cells fight against infection.  Platelets control bleeding.  Plasma helps clot blood.  Other blood products are available for specialized needs, such as hemophilia or other clotting disorders. BEFORE THE TRANSFUSION  Who gives blood for transfusions?   Healthy volunteers who are fully evaluated to make sure their blood is safe. This is blood bank blood. Transfusion therapy is the safest it has ever been in the practice of medicine. Before blood is taken from a donor, a complete history is taken to make sure that person has no history of diseases nor engages in risky social behavior (examples are intravenous drug use or sexual activity with multiple partners). The donor's travel history is screened to minimize risk of transmitting infections, such as malaria. The donated blood is tested for signs of infectious diseases, such as HIV and hepatitis. The blood is then tested to be sure it is compatible with you in order to minimize the chance of a transfusion reaction. If you or a relative donates blood, this is often done in anticipation of surgery and is not appropriate for emergency situations. It takes many days to process the donated blood. RISKS AND COMPLICATIONS Although transfusion therapy is very safe and saves many lives, the main dangers of transfusion include:   Getting an infectious disease.  Developing a transfusion reaction. This is an allergic reaction to something in the blood you were given. Every  precaution is taken to prevent this. The decision to have a blood transfusion has been considered carefully by your caregiver before blood is given. Blood is not given unless the benefits outweigh the risks. AFTER THE TRANSFUSION  Right after receiving a blood transfusion, you will usually feel much better and more energetic.  This is especially true if your red blood cells have gotten low (anemic). The transfusion raises the level of the red blood cells which carry oxygen, and this usually causes an energy increase.  The nurse administering the transfusion will monitor you carefully for complications. HOME CARE INSTRUCTIONS  No special instructions are needed after a transfusion. You may find your energy is better. Speak with your caregiver about any limitations on activity for underlying diseases you may have. SEEK MEDICAL CARE IF:   Your condition is not improving after your transfusion.  You develop redness or irritation at the intravenous (IV) site. SEEK IMMEDIATE MEDICAL CARE IF:  Any of the following symptoms occur over the next 12 hours:  Shaking chills.  You have a temperature by mouth above 102 F (38.9 C), not controlled by medicine.  Chest, back, or muscle pain.  People around you feel you are not acting correctly or are confused.  Shortness of breath or difficulty breathing.  Dizziness and fainting.  You get a rash or develop hives.  You have a decrease in urine output.  Your urine turns a dark color or changes to pink, red, or brown. Any of the following symptoms occur over the next 10 days:  You have a temperature by mouth above 102 F (38.9 C), not controlled by medicine.  Shortness of breath.  Weakness after normal activity.  The white part of the eye turns yellow (jaundice).  You have a decrease in the amount of urine or are urinating less often.  Your urine turns a dark color or changes to pink, red, or brown. Document Released: 10/21/2000 Document Revised: 01/16/2012 Document Reviewed: 06/09/2008 [facility] Patient Information 2014 [place], [place].  _______________________________________________________________________

## 2015-04-27 ENCOUNTER — Encounter (HOSPITAL_COMMUNITY): Payer: Self-pay

## 2015-04-27 ENCOUNTER — Encounter (HOSPITAL_COMMUNITY)
Admission: RE | Admit: 2015-04-27 | Discharge: 2015-04-27 | Disposition: A | Discharge status: Home / Self Care | Payer: BLUE CROSS/BLUE SHIELD | Source: Ambulatory Visit | Attending: Orthopaedic Surgery | Admitting: Orthopaedic Surgery

## 2015-04-27 DIAGNOSIS — M179 Osteoarthritis of knee, unspecified: Secondary | ICD-10-CM | POA: Insufficient documentation

## 2015-04-27 DIAGNOSIS — Z0183 Encounter for blood typing: Secondary | ICD-10-CM | POA: Insufficient documentation

## 2015-04-27 DIAGNOSIS — Z01812 Encounter for preprocedural laboratory examination: Secondary | ICD-10-CM | POA: Diagnosis present

## 2015-04-27 DIAGNOSIS — Z01810 Encounter for preprocedural cardiovascular examination: Secondary | ICD-10-CM | POA: Insufficient documentation

## 2015-04-27 HISTORY — DX: Sleep apnea, unspecified: G47.30

## 2015-04-27 HISTORY — DX: Gastro-esophageal reflux disease without esophagitis: K21.9

## 2015-04-27 HISTORY — DX: Unspecified hemorrhoids: K64.9

## 2015-04-27 HISTORY — DX: Pneumonia, unspecified organism: J18.9

## 2015-04-27 LAB — CBC
HCT: 44 % (ref 39.0–52.0)
Hemoglobin: 14.3 g/dL (ref 13.0–17.0)
MCH: 31.2 pg (ref 26.0–34.0)
MCHC: 32.5 g/dL (ref 30.0–36.0)
MCV: 95.9 fL (ref 78.0–100.0)
PLATELETS: 242 10*3/uL (ref 150–400)
RBC: 4.59 MIL/uL (ref 4.22–5.81)
RDW: 13.5 % (ref 11.5–15.5)
WBC: 12.1 10*3/uL — AB (ref 4.0–10.5)

## 2015-04-27 LAB — PROTIME-INR
INR: 1.04 (ref 0.00–1.49)
Prothrombin Time: 13.8 seconds (ref 11.6–15.2)

## 2015-04-27 LAB — BASIC METABOLIC PANEL
ANION GAP: 9 (ref 5–15)
BUN: 19 mg/dL (ref 6–20)
CALCIUM: 9.4 mg/dL (ref 8.9–10.3)
CO2: 26 mmol/L (ref 22–32)
Chloride: 106 mmol/L (ref 101–111)
Creatinine, Ser: 0.84 mg/dL (ref 0.61–1.24)
GFR calc Af Amer: >60 mL/min (ref >60)
GFR calc non Af Amer: >60 mL/min (ref >60)
Glucose, Bld: 100 mg/dL — ABNORMAL HIGH (ref 65–99)
Potassium: 4.9 mmol/L (ref 3.5–5.1)
SODIUM: 141 mmol/L (ref 135–145)

## 2015-04-27 LAB — SURGICAL PCR SCREEN
MRSA, PCR: NEGATIVE
STAPHYLOCOCCUS AUREUS: NEGATIVE

## 2015-04-27 LAB — APTT: APTT: 28 s (ref 24–37)

## 2015-04-27 LAB — ABO/RH: ABO/RH(D): A POS

## 2015-04-27 NOTE — Progress Notes (Signed)
LOV - Dr. Dallas Schimke- 03-11-15 EPIC 02-16-15 LOV- Dr. Shella Maxim (internal med) EPIC

## 2015-05-01 ENCOUNTER — Encounter (HOSPITAL_COMMUNITY): Payer: Self-pay | Admitting: *Deleted

## 2015-05-01 ENCOUNTER — Encounter (HOSPITAL_COMMUNITY)
Admission: RE | Disposition: A | Discharge status: Skilled Nursing Facility | Payer: Self-pay | Source: Ambulatory Visit | Attending: Orthopaedic Surgery

## 2015-05-01 ENCOUNTER — Inpatient Hospital Stay (HOSPITAL_COMMUNITY)
Admission: RE | Admit: 2015-05-01 | Discharge: 2015-05-05 | DRG: 470 | Disposition: A | Discharge status: Skilled Nursing Facility | Payer: BLUE CROSS/BLUE SHIELD | Source: Ambulatory Visit | Attending: Orthopaedic Surgery | Admitting: Orthopaedic Surgery

## 2015-05-01 ENCOUNTER — Inpatient Hospital Stay (HOSPITAL_COMMUNITY): Payer: BLUE CROSS/BLUE SHIELD | Admitting: Certified Registered Nurse Anesthetist

## 2015-05-01 ENCOUNTER — Inpatient Hospital Stay (HOSPITAL_COMMUNITY): Payer: BLUE CROSS/BLUE SHIELD

## 2015-05-01 DIAGNOSIS — Z6841 Body Mass Index (BMI) 40.0 and over, adult: Secondary | ICD-10-CM | POA: Diagnosis not present

## 2015-05-01 DIAGNOSIS — M25561 Pain in right knee: Secondary | ICD-10-CM | POA: Diagnosis present

## 2015-05-01 DIAGNOSIS — G894 Chronic pain syndrome: Secondary | ICD-10-CM | POA: Diagnosis present

## 2015-05-01 DIAGNOSIS — M1711 Unilateral primary osteoarthritis, right knee: Principal | ICD-10-CM

## 2015-05-01 DIAGNOSIS — Z880 Allergy status to penicillin: Secondary | ICD-10-CM

## 2015-05-01 DIAGNOSIS — I10 Essential (primary) hypertension: Secondary | ICD-10-CM | POA: Diagnosis present

## 2015-05-01 DIAGNOSIS — Z8701 Personal history of pneumonia (recurrent): Secondary | ICD-10-CM

## 2015-05-01 DIAGNOSIS — Z87891 Personal history of nicotine dependence: Secondary | ICD-10-CM

## 2015-05-01 DIAGNOSIS — Z7982 Long term (current) use of aspirin: Secondary | ICD-10-CM | POA: Diagnosis not present

## 2015-05-01 DIAGNOSIS — G4730 Sleep apnea, unspecified: Secondary | ICD-10-CM | POA: Diagnosis present

## 2015-05-01 DIAGNOSIS — M109 Gout, unspecified: Secondary | ICD-10-CM | POA: Diagnosis present

## 2015-05-01 DIAGNOSIS — K219 Gastro-esophageal reflux disease without esophagitis: Secondary | ICD-10-CM | POA: Diagnosis present

## 2015-05-01 DIAGNOSIS — E1065 Type 1 diabetes mellitus with hyperglycemia: Secondary | ICD-10-CM | POA: Diagnosis present

## 2015-05-01 DIAGNOSIS — H8110 Benign paroxysmal vertigo, unspecified ear: Secondary | ICD-10-CM | POA: Diagnosis present

## 2015-05-01 DIAGNOSIS — Z96651 Presence of right artificial knee joint: Secondary | ICD-10-CM

## 2015-05-01 HISTORY — PX: TOTAL KNEE ARTHROPLASTY: SHX125

## 2015-05-01 LAB — GLUCOSE, CAPILLARY
GLUCOSE-CAPILLARY: 135 mg/dL — AB (ref 65–99)
GLUCOSE-CAPILLARY: 165 mg/dL — AB (ref 65–99)
Glucose-Capillary: 135 mg/dL — ABNORMAL HIGH (ref 65–99)
Glucose-Capillary: 203 mg/dL — ABNORMAL HIGH (ref 65–99)
Glucose-Capillary: 176 mg/dL — ABNORMAL HIGH (ref 65–99)

## 2015-05-01 LAB — TYPE AND SCREEN
ABO/RH(D): A POS
Antibody Screen: NEGATIVE

## 2015-05-01 SURGERY — ARTHROPLASTY, KNEE, TOTAL
Anesthesia: Spinal | Site: Knee | Laterality: Right

## 2015-05-01 MED ORDER — ACETAMINOPHEN 650 MG RE SUPP: 650.0000 mg | Freq: Four times a day (QID) | RECTAL | Status: DC | PRN

## 2015-05-01 MED ORDER — CLINDAMYCIN PHOSPHATE 600 MG/50ML IV SOLN
600.0000 mg | Freq: Four times a day (QID) | INTRAVENOUS | Status: AC
  Administered 2015-05-01 (×2): 600 mg via INTRAVENOUS
  Filled 2015-05-01 (×2): qty 50

## 2015-05-01 MED ORDER — LACTATED RINGERS IV SOLN: INTRAVENOUS | Status: DC

## 2015-05-01 MED ORDER — METOCLOPRAMIDE HCL 5 MG/ML IJ SOLN
INTRAMUSCULAR | Status: DC | PRN
  Administered 2015-05-01: 10 mg via INTRAVENOUS

## 2015-05-01 MED ORDER — METHOCARBAMOL 500 MG PO TABS
500.0000 mg | ORAL_TABLET | Freq: Four times a day (QID) | ORAL | Status: DC | PRN
  Administered 2015-05-02 – 2015-05-04 (×6): 500 mg via ORAL
  Filled 2015-05-01 (×6): qty 1

## 2015-05-01 MED ORDER — ONDANSETRON HCL 4 MG/2ML IJ SOLN: 4.0000 mg | Freq: Four times a day (QID) | INTRAMUSCULAR | Status: DC | PRN

## 2015-05-01 MED ORDER — 0.9 % SODIUM CHLORIDE (POUR BTL) OPTIME
TOPICAL | Status: DC | PRN
  Administered 2015-05-01: 1000 mL

## 2015-05-01 MED ORDER — DIPHENHYDRAMINE HCL 12.5 MG/5ML PO ELIX: 12.5000 mg | ORAL_SOLUTION | ORAL | Status: DC | PRN

## 2015-05-01 MED ORDER — BUPIVACAINE IN DEXTROSE 0.75-8.25 % IT SOLN
INTRATHECAL | Status: DC | PRN
  Administered 2015-05-01: 1.8 mL via INTRATHECAL

## 2015-05-01 MED ORDER — PROPOFOL 10 MG/ML IV BOLUS
INTRAVENOUS | Status: AC
  Filled 2015-05-01: qty 20

## 2015-05-01 MED ORDER — KETAMINE HCL 10 MG/ML IJ SOLN
INTRAMUSCULAR | Status: DC | PRN
  Administered 2015-05-01 (×2): 10 mg via INTRAVENOUS
  Administered 2015-05-01: 20 mg via INTRAVENOUS
  Administered 2015-05-01 (×2): 10 mg via INTRAVENOUS

## 2015-05-01 MED ORDER — EPHEDRINE SULFATE 50 MG/ML IJ SOLN
INTRAMUSCULAR | Status: AC
  Filled 2015-05-01: qty 1

## 2015-05-01 MED ORDER — HYDROMORPHONE HCL 1 MG/ML IJ SOLN
1.0000 mg | INTRAMUSCULAR | Status: DC | PRN
  Administered 2015-05-01 – 2015-05-02 (×3): 1 mg via INTRAVENOUS
  Administered 2015-05-02: 2 mg via INTRAVENOUS
  Administered 2015-05-02: 1 mg via INTRAVENOUS
  Filled 2015-05-01 (×5): qty 1

## 2015-05-01 MED ORDER — RIVAROXABAN 10 MG PO TABS
10.0000 mg | ORAL_TABLET | Freq: Every day | ORAL | Status: DC
  Administered 2015-05-02 – 2015-05-05 (×4): 10 mg via ORAL
  Filled 2015-05-01 (×5): qty 1

## 2015-05-01 MED ORDER — ALUM & MAG HYDROXIDE-SIMETH 200-200-20 MG/5ML PO SUSP: 30.0000 mL | ORAL | Status: DC | PRN

## 2015-05-01 MED ORDER — CETYLPYRIDINIUM CHLORIDE 0.05 % MT LIQD
7.0000 mL | Freq: Two times a day (BID) | OROMUCOSAL | Status: DC
  Administered 2015-05-01 – 2015-05-05 (×8): 7 mL via OROMUCOSAL

## 2015-05-01 MED ORDER — BISOPROLOL-HYDROCHLOROTHIAZIDE 10-6.25 MG PO TABS
1.0000 | ORAL_TABLET | Freq: Every day | ORAL | Status: DC
  Administered 2015-05-02 – 2015-05-05 (×4): 1 via ORAL
  Filled 2015-05-01 (×5): qty 1

## 2015-05-01 MED ORDER — LACTATED RINGERS IV SOLN
INTRAVENOUS | Status: DC | PRN
Start: 2015-05-01 — End: 2015-05-01
  Administered 2015-05-01 (×3): via INTRAVENOUS

## 2015-05-01 MED ORDER — METOCLOPRAMIDE HCL 10 MG PO TABS: 5.0000 mg | ORAL_TABLET | Freq: Three times a day (TID) | ORAL | Status: DC | PRN

## 2015-05-01 MED ORDER — PROMETHAZINE HCL 25 MG/ML IJ SOLN: 6.2500 mg | INTRAMUSCULAR | Status: DC | PRN

## 2015-05-01 MED ORDER — MEPERIDINE HCL 50 MG/ML IJ SOLN: 6.2500 mg | INTRAMUSCULAR | Status: DC | PRN

## 2015-05-01 MED ORDER — CLINDAMYCIN PHOSPHATE 900 MG/50ML IV SOLN
INTRAVENOUS | Status: AC
  Filled 2015-05-01: qty 50

## 2015-05-01 MED ORDER — DIPHENHYDRAMINE HCL 50 MG/ML IJ SOLN
INTRAMUSCULAR | Status: AC
  Filled 2015-05-01: qty 1

## 2015-05-01 MED ORDER — GLYCOPYRROLATE 0.2 MG/ML IJ SOLN
INTRAMUSCULAR | Status: DC | PRN
  Administered 2015-05-01 (×5): 0.1 mg via INTRAVENOUS

## 2015-05-01 MED ORDER — SODIUM CHLORIDE 0.9 % IV SOLN
INTRAVENOUS | Status: DC
  Administered 2015-05-01: 12:00:00 via INTRAVENOUS
  Administered 2015-05-02: 75 mL/h via INTRAVENOUS

## 2015-05-01 MED ORDER — PHENOL 1.4 % MT LIQD
1.0000 | OROMUCOSAL | Status: DC | PRN
  Filled 2015-05-01: qty 177

## 2015-05-01 MED ORDER — PROPOFOL INFUSION 10 MG/ML OPTIME
INTRAVENOUS | Status: DC | PRN
  Administered 2015-05-01: 250 ug/kg/min via INTRAVENOUS

## 2015-05-01 MED ORDER — GLYCOPYRROLATE 0.2 MG/ML IJ SOLN
INTRAMUSCULAR | Status: AC
  Filled 2015-05-01: qty 2

## 2015-05-01 MED ORDER — EMPAGLIFLOZIN 25 MG PO TABS
25.0000 mg | ORAL_TABLET | Freq: Every day | ORAL | Status: DC
  Administered 2015-05-03 – 2015-05-05 (×3): 25 mg via ORAL

## 2015-05-01 MED ORDER — ONDANSETRON HCL 4 MG/2ML IJ SOLN
INTRAMUSCULAR | Status: AC
  Filled 2015-05-01: qty 2

## 2015-05-01 MED ORDER — TRANEXAMIC ACID 1000 MG/10ML IV SOLN
1000.0000 mg | INTRAVENOUS | Status: AC
  Administered 2015-05-01: 1000 mg via INTRAVENOUS
  Filled 2015-05-01: qty 10

## 2015-05-01 MED ORDER — MIDAZOLAM HCL 5 MG/5ML IJ SOLN
INTRAMUSCULAR | Status: DC | PRN
  Administered 2015-05-01: 2 mg via INTRAVENOUS

## 2015-05-01 MED ORDER — METOCLOPRAMIDE HCL 5 MG/ML IJ SOLN
INTRAMUSCULAR | Status: AC
  Filled 2015-05-01: qty 2

## 2015-05-01 MED ORDER — PROPOFOL 10 MG/ML IV BOLUS
INTRAVENOUS | Status: DC | PRN
  Administered 2015-05-01: 20 mg via INTRAVENOUS
  Administered 2015-05-01: 50 mg via INTRAVENOUS
  Administered 2015-05-01: 20 mg via INTRAVENOUS
  Administered 2015-05-01: 30 mg via INTRAVENOUS
  Administered 2015-05-01 (×3): 20 mg via INTRAVENOUS
  Administered 2015-05-01: 30 mg via INTRAVENOUS
  Administered 2015-05-01: 10 mg via INTRAVENOUS

## 2015-05-01 MED ORDER — ONDANSETRON HCL 4 MG/2ML IJ SOLN
INTRAMUSCULAR | Status: DC | PRN
  Administered 2015-05-01: 4 mg via INTRAVENOUS

## 2015-05-01 MED ORDER — DIPHENHYDRAMINE HCL 50 MG/ML IJ SOLN
INTRAMUSCULAR | Status: DC | PRN
  Administered 2015-05-01: 12.5 mg via INTRAVENOUS

## 2015-05-01 MED ORDER — SODIUM CHLORIDE 0.9 % IR SOLN
Status: DC | PRN
  Administered 2015-05-01: 2000 mL

## 2015-05-01 MED ORDER — FENTANYL CITRATE (PF) 100 MCG/2ML IJ SOLN: 25.0000 ug | INTRAMUSCULAR | Status: DC | PRN

## 2015-05-01 MED ORDER — INSULIN ASPART 100 UNIT/ML ~~LOC~~ SOLN
0.0000 [IU] | Freq: Three times a day (TID) | SUBCUTANEOUS | Status: DC
  Administered 2015-05-01: 7 [IU] via SUBCUTANEOUS
  Administered 2015-05-01: 4 [IU] via SUBCUTANEOUS
  Administered 2015-05-02: 7 [IU] via SUBCUTANEOUS
  Administered 2015-05-02 – 2015-05-03 (×3): 4 [IU] via SUBCUTANEOUS
  Administered 2015-05-03 – 2015-05-05 (×6): 3 [IU] via SUBCUTANEOUS
  Administered 2015-05-05: 4 [IU] via SUBCUTANEOUS

## 2015-05-01 MED ORDER — ACETAMINOPHEN 325 MG PO TABS
650.0000 mg | ORAL_TABLET | Freq: Four times a day (QID) | ORAL | Status: DC | PRN
Start: 2015-05-01 — End: 2015-05-05
  Administered 2015-05-01 – 2015-05-04 (×5): 650 mg via ORAL
  Filled 2015-05-01 (×4): qty 2

## 2015-05-01 MED ORDER — METFORMIN HCL 500 MG PO TABS
500.0000 mg | ORAL_TABLET | Freq: Two times a day (BID) | ORAL | Status: DC
  Administered 2015-05-02 – 2015-05-05 (×7): 500 mg via ORAL
  Filled 2015-05-01 (×9): qty 1

## 2015-05-01 MED ORDER — MIDAZOLAM HCL 2 MG/2ML IJ SOLN
INTRAMUSCULAR | Status: AC
  Filled 2015-05-01: qty 2

## 2015-05-01 MED ORDER — CLINDAMYCIN PHOSPHATE 900 MG/50ML IV SOLN
900.0000 mg | INTRAVENOUS | Status: AC
  Administered 2015-05-01: 900 mg via INTRAVENOUS

## 2015-05-01 MED ORDER — EPHEDRINE SULFATE 50 MG/ML IJ SOLN
INTRAMUSCULAR | Status: DC | PRN
  Administered 2015-05-01 (×2): 10 mg via INTRAVENOUS
  Administered 2015-05-01 (×2): 5 mg via INTRAVENOUS
  Administered 2015-05-01: 10 mg via INTRAVENOUS
  Administered 2015-05-01: 5 mg via INTRAVENOUS

## 2015-05-01 MED ORDER — METOCLOPRAMIDE HCL 5 MG/ML IJ SOLN
5.0000 mg | Freq: Three times a day (TID) | INTRAMUSCULAR | Status: DC | PRN
Start: 2015-05-01 — End: 2015-05-05

## 2015-05-01 MED ORDER — GLIPIZIDE 10 MG PO TABS
10.0000 mg | ORAL_TABLET | Freq: Two times a day (BID) | ORAL | Status: DC
  Administered 2015-05-01 – 2015-05-05 (×8): 10 mg via ORAL
  Filled 2015-05-01 (×10): qty 1

## 2015-05-01 MED ORDER — MENTHOL 3 MG MT LOZG
1.0000 | LOZENGE | OROMUCOSAL | Status: DC | PRN
  Filled 2015-05-01: qty 9

## 2015-05-01 MED ORDER — KETAMINE HCL 10 MG/ML IJ SOLN
INTRAMUSCULAR | Status: AC
  Filled 2015-05-01: qty 1

## 2015-05-01 MED ORDER — BISOPROLOL FUMARATE 10 MG PO TABS
10.0000 mg | ORAL_TABLET | Freq: Once | ORAL | Status: AC
  Administered 2015-05-01: 10 mg via ORAL
  Filled 2015-05-01: qty 1

## 2015-05-01 MED ORDER — DOCUSATE SODIUM 100 MG PO CAPS
100.0000 mg | ORAL_CAPSULE | Freq: Two times a day (BID) | ORAL | Status: DC
  Administered 2015-05-01 – 2015-05-05 (×8): 100 mg via ORAL
  Filled 2015-05-01 (×10): qty 1

## 2015-05-01 MED ORDER — INSULIN GLARGINE 100 UNIT/ML ~~LOC~~ SOLN
60.0000 [IU] | Freq: Every day | SUBCUTANEOUS | Status: DC
  Administered 2015-05-01 – 2015-05-04 (×4): 60 [IU] via SUBCUTANEOUS
  Filled 2015-05-01 (×5): qty 0.6

## 2015-05-01 MED ORDER — DEXAMETHASONE SODIUM PHOSPHATE 10 MG/ML IJ SOLN
INTRAMUSCULAR | Status: DC | PRN
  Administered 2015-05-01: 10 mg via INTRAVENOUS

## 2015-05-01 MED ORDER — OXYCODONE HCL ER 20 MG PO T12A
20.0000 mg | EXTENDED_RELEASE_TABLET | Freq: Two times a day (BID) | ORAL | Status: DC
  Administered 2015-05-01 – 2015-05-05 (×9): 20 mg via ORAL
  Filled 2015-05-01 (×9): qty 1

## 2015-05-01 MED ORDER — ZOLPIDEM TARTRATE 5 MG PO TABS: 5.0000 mg | ORAL_TABLET | Freq: Every evening | ORAL | Status: DC | PRN

## 2015-05-01 MED ORDER — METHOCARBAMOL 1000 MG/10ML IJ SOLN
500.0000 mg | Freq: Four times a day (QID) | INTRAVENOUS | Status: DC | PRN
  Administered 2015-05-01 – 2015-05-02 (×3): 500 mg via INTRAVENOUS
  Filled 2015-05-01 (×5): qty 5

## 2015-05-01 MED ORDER — DEXAMETHASONE SODIUM PHOSPHATE 10 MG/ML IJ SOLN
INTRAMUSCULAR | Status: AC
  Filled 2015-05-01: qty 1

## 2015-05-01 MED ORDER — ONDANSETRON HCL 4 MG PO TABS
4.0000 mg | ORAL_TABLET | Freq: Four times a day (QID) | ORAL | Status: DC | PRN
  Administered 2015-05-04: 4 mg via ORAL
  Filled 2015-05-01: qty 1

## 2015-05-01 MED ORDER — OXYCODONE HCL 5 MG PO TABS
5.0000 mg | ORAL_TABLET | ORAL | Status: DC | PRN
  Administered 2015-05-01: 5 mg via ORAL
  Administered 2015-05-01: 10 mg via ORAL
  Administered 2015-05-02 (×2): 15 mg via ORAL
  Administered 2015-05-02: 10 mg via ORAL
  Administered 2015-05-02 – 2015-05-05 (×15): 15 mg via ORAL
  Filled 2015-05-01 (×5): qty 3
  Filled 2015-05-01: qty 2
  Filled 2015-05-01: qty 3
  Filled 2015-05-01: qty 1
  Filled 2015-05-01 (×2): qty 3
  Filled 2015-05-01: qty 1
  Filled 2015-05-01 (×10): qty 3

## 2015-05-01 SURGICAL SUPPLY — 62 items
APL SKNCLS STERI-STRIP NONHPOA (GAUZE/BANDAGES/DRESSINGS) ×1
BAG SPEC THK2 15X12 ZIP CLS (MISCELLANEOUS)
BAG ZIPLOCK 12X15 (MISCELLANEOUS) IMPLANT
BANDAGE ELASTIC 6 VELCRO ST LF (GAUZE/BANDAGES/DRESSINGS) ×5 IMPLANT
BANDAGE ESMARK 6X9 LF (GAUZE/BANDAGES/DRESSINGS) ×1 IMPLANT
BENZOIN TINCTURE PRP APPL 2/3 (GAUZE/BANDAGES/DRESSINGS) ×2 IMPLANT
BLADE SAG 13.0X1.37X90 (BLADE) IMPLANT
BLADE SAG 18X100X1.27 (BLADE) ×2 IMPLANT
BNDG CMPR 9X6 STRL LF SNTH (GAUZE/BANDAGES/DRESSINGS) ×1
BNDG ESMARK 6X9 LF (GAUZE/BANDAGES/DRESSINGS) ×3
BOWL SMART MIX CTS (DISPOSABLE) ×3 IMPLANT
CAPT KNEE TOTAL 3 ×2 IMPLANT
CEMENT BONE 1-PACK (Cement) ×6 IMPLANT
CLOSURE WOUND 1/2 X4 (GAUZE/BANDAGES/DRESSINGS) ×2
CUFF TOURN SGL QUICK 34 (TOURNIQUET CUFF) ×3
CUFF TRNQT CYL 34X4X40X1 (TOURNIQUET CUFF) ×1 IMPLANT
DRAPE EXTREMITY T 121X128X90 (DRAPE) ×3 IMPLANT
DRAPE POUCH INSTRU U-SHP 10X18 (DRAPES) ×3 IMPLANT
DRAPE SHEET LG 3/4 BI-LAMINATE (DRAPES) ×2 IMPLANT
DRAPE U-SHAPE 47X51 STRL (DRAPES) ×3 IMPLANT
DRSG AQUACEL AG ADV 3.5X10 (GAUZE/BANDAGES/DRESSINGS) ×3 IMPLANT
DRSG PAD ABDOMINAL 8X10 ST (GAUZE/BANDAGES/DRESSINGS) ×3 IMPLANT
DURAPREP 26ML APPLICATOR (WOUND CARE) ×3 IMPLANT
ELECT REM PT RETURN 9FT ADLT (ELECTROSURGICAL) ×3
ELECTRODE REM PT RTRN 9FT ADLT (ELECTROSURGICAL) ×1 IMPLANT
FACESHIELD WRAPAROUND (MASK) ×15 IMPLANT
FACESHIELD WRAPAROUND OR TEAM (MASK) ×5 IMPLANT
GAUZE SPONGE 4X4 12PLY STRL (GAUZE/BANDAGES/DRESSINGS) ×3 IMPLANT
GLOVE BIO SURGEON STRL SZ7.5 (GLOVE) ×3 IMPLANT
GLOVE BIOGEL PI IND STRL 7.5 (GLOVE) IMPLANT
GLOVE BIOGEL PI IND STRL 8 (GLOVE) ×2 IMPLANT
GLOVE BIOGEL PI INDICATOR 7.5 (GLOVE) ×2
GLOVE BIOGEL PI INDICATOR 8 (GLOVE) ×6
GLOVE ECLIPSE 8.0 STRL XLNG CF (GLOVE) ×3 IMPLANT
GLOVE SURG SS PI 7.5 STRL IVOR (GLOVE) ×2 IMPLANT
GLOVE SURG SS PI 8.0 STRL IVOR (GLOVE) ×2 IMPLANT
GOWN SPEC L3 XXLG W/TWL (GOWN DISPOSABLE) ×2 IMPLANT
GOWN STRL REUS W/TWL XL LVL3 (GOWN DISPOSABLE) ×8 IMPLANT
HANDPIECE INTERPULSE COAX TIP (DISPOSABLE) ×3
IMMOBILIZER KNEE 20 (SOFTGOODS) ×3
IMMOBILIZER KNEE 20 THIGH 36 (SOFTGOODS) ×1 IMPLANT
PACK TOTAL JOINT (CUSTOM PROCEDURE TRAY) ×3 IMPLANT
PADDING CAST COTTON 6X4 STRL (CAST SUPPLIES) ×4 IMPLANT
PEN SKIN MARKING BROAD (MISCELLANEOUS) ×3 IMPLANT
POSITIONER SURGICAL ARM (MISCELLANEOUS) ×3 IMPLANT
SET HNDPC FAN SPRY TIP SCT (DISPOSABLE) ×1 IMPLANT
SET PAD KNEE POSITIONER (MISCELLANEOUS) ×3 IMPLANT
STRIP CLOSURE SKIN 1/2X4 (GAUZE/BANDAGES/DRESSINGS) ×2 IMPLANT
SUCTION FRAZIER 12FR DISP (SUCTIONS) ×3 IMPLANT
SUT MNCRL AB 4-0 PS2 18 (SUTURE) ×2 IMPLANT
SUT VIC AB 0 CT1 27 (SUTURE) ×3
SUT VIC AB 0 CT1 27XBRD ANTBC (SUTURE) ×1 IMPLANT
SUT VIC AB 1 CT1 27 (SUTURE) ×6
SUT VIC AB 1 CT1 27XBRD ANTBC (SUTURE) ×2 IMPLANT
SUT VIC AB 2-0 CT1 27 (SUTURE) ×6
SUT VIC AB 2-0 CT1 TAPERPNT 27 (SUTURE) ×2 IMPLANT
TOWEL OR 17X26 10 PK STRL BLUE (TOWEL DISPOSABLE) ×3 IMPLANT
TOWEL OR NON WOVEN STRL DISP B (DISPOSABLE) ×2 IMPLANT
TRAY FOLEY W/METER SILVER 16FR (SET/KITS/TRAYS/PACK) ×2 IMPLANT
WATER STERILE IRR 1500ML POUR (IV SOLUTION) ×3 IMPLANT
WRAP KNEE MAXI GEL POST OP (GAUZE/BANDAGES/DRESSINGS) ×3 IMPLANT
YANKAUER SUCT BULB TIP 10FT TU (MISCELLANEOUS) ×3 IMPLANT

## 2015-05-01 NOTE — Anesthesia Postprocedure Evaluation (Signed)
  Anesthesia Post-op Note  Patient: Christian [name]  Procedure(s) Performed: Procedure(s) (LRB): RIGHT TOTAL KNEE ARTHROPLASTY (Right)  Patient Location: PACU  Anesthesia Type: Spinal  Level of Consciousness: awake and alert   Airway and Oxygen Therapy: Patient Spontanous Breathing  Post-op Pain: mild  Post-op Assessment: Post-op Vital signs reviewed, Patient's Cardiovascular Status Stable, Respiratory Function Stable, Patent Airway and No signs of Nausea or vomiting  Last Vitals:  Filed Vitals:   05/01/15 1045  BP: 125/66  Pulse: 73  Temp: 36.7 C  Resp: 20    Post-op Vital Signs: stable   Complications: No apparent anesthesia complications

## 2015-05-01 NOTE — H&P (Signed)
TOTAL KNEE ADMISSION H&P  Patient is being admitted for right total knee arthroplasty.  Subjective:  Chief Complaint:right knee pain.  HPI: Christian [name], 64 y.o. male, has a history of pain and functional disability in the right knee due to arthritis and has failed non-surgical conservative treatments for greater than 12 weeks to includeNSAID's and/or analgesics, corticosteriod injections, viscosupplementation injections, flexibility and strengthening excercises, weight reduction as appropriate and activity modification.  Onset of symptoms was gradual, starting 3 years ago with rapidlly worsening course since that time. The patient noted no past surgery on the right knee(s).  Patient currently rates pain in the right knee(s) at 10 out of 10 with activity. Patient has night pain, worsening of pain with activity and weight bearing, pain that interferes with activities of daily living, pain with passive range of motion, crepitus and joint swelling.  Patient has evidence of subchondral sclerosis, periarticular osteophytes and joint space narrowing by imaging studies. There is no active infection.  Patient Active Problem List   Diagnosis Date Noted  . Osteoarthritis of right knee 05/01/2015  . Benign paroxysmal positional vertigo 12/09/2013  . Eustachian tube dysfunction 12/09/2013  . Gout 09/13/2013  . Chronic pain syndrome 09/13/2013  . Osteoarthritis 09/13/2013  . Chronic back pain 09/13/2013  . Morbid obesity with body mass index of 45.0-49.9 in adult 09/13/2013  . Seizures   . Hypertension   . Type 2 diabetes mellitus, uncontrolled    Past Medical History  Diagnosis Date  . Hypertension   . Seizures   . Type 1 diabetes mellitus, uncontrolled   . Gout 09/13/2013  . Chronic pain syndrome 09/13/2013  . Osteoarthritis 09/13/2013  . Chronic back pain 09/13/2013  . Morbid obesity with body mass index of 45.0-49.9 in adult 09/13/2013  . Sleep apnea     has never used C-pap machine due to  insurance cost  . Pneumonia     hx. of  . GERD (gastroesophageal reflux disease)     hx. of  . Hemorrhoids     Past Surgical History  Procedure Laterality Date  . Wisdom tooth extraction    . Broken toe 35 years ago      Prescriptions prior to admission  Medication Sig Dispense Refill Last Dose  . aspirin 81 MG chewable tablet Chew 81 mg by mouth daily.   04/30/2015 at am  . bisoprolol-hydrochlorothiazide (ZIAC) 10-6.25 MG per tablet TAKE 1 TABLET EVERY MORNING (NEEDS OFFICE VISIT IN MAY) 90 tablet 0 04/30/2015 at am  . Diclofenac-Misoprostol 75-0.2 MG TBEC TAKE 1 TABLET BY MOUTH TWICE A DAY AS NEEDED FOR PAIN AND INFLAMMATION 60 tablet 5 04/30/2015 at 1100  . empagliflozin (JARDIANCE) 25 MG TABS tablet Take 25 mg by mouth daily. 30 tablet 2 04/30/2015 at am  . glipiZIDE (GLUCOTROL) 10 MG tablet TAKE 1 TABLET (10 MG TOTAL) BY MOUTH 2 (TWO) TIMES DAILY BEFORE A MEAL. 60 tablet 2 04/30/2015 at 1000  . Insulin Glargine (TOUJEO SOLOSTAR) 300 UNIT/ML SOPN Inject 60 Units into the skin at bedtime. 18 mL 1 04/30/2015 at 2115  . metFORMIN (GLUCOPHAGE) 500 MG tablet TAKE 2 TABLETS BY MOUTH TWICE A DAY WITH A MEAL 120 tablet 2 04/30/2015 at am  . oxyCODONE-acetaminophen (PERCOCET/ROXICET) 5-325 MG per tablet Take 1 tablet by mouth 2 (two) times daily. (Patient taking differently: Take 1 tablet by mouth 2 (two) times daily as needed for severe pain. ) 60 tablet 0 04/29/2015   Allergies  Allergen Reactions  . Penicillins  REACTION: swelling, rash    History  Substance Use Topics  . Smoking status: Former Smoker    Quit date: 03/08/2004  . Smokeless tobacco: Never Used  . Alcohol Use: No     Comment: rare    Family History  Problem Relation Age of Onset  . Alcohol abuse Father   . Diabetes Maternal Aunt   . Arthritis Maternal Grandmother   . Alcohol abuse Paternal Grandmother   . Alcohol abuse Paternal Grandfather      Review of Systems  Musculoskeletal: Positive for back pain and joint  pain.  All other systems reviewed and are negative.   Objective:  Physical Exam  Constitutional: He is oriented to person, place, and time. He appears well-developed and well-nourished.  HENT:  Head: Normocephalic and atraumatic.  Eyes: EOM are normal. Pupils are equal, round, and reactive to light.  Neck: Normal range of motion. Neck supple.  Cardiovascular: Normal rate and regular rhythm.   Respiratory: Effort normal and breath sounds normal.  GI: Soft. Bowel sounds are normal.  Musculoskeletal:       Right knee: He exhibits decreased range of motion, swelling, effusion, abnormal alignment and bony tenderness. Tenderness found. Medial joint line and lateral joint line tenderness noted.  Neurological: He is alert and oriented to person, place, and time.  Skin: Skin is warm and dry.  Psychiatric: He has a normal mood and affect.    Vital signs in last 24 hours: Temp:  [97.6 F (36.4 C)] 97.6 F (36.4 C) (06/24 0533) Pulse Rate:  [69] 69 (06/24 0533) Resp:  [16] 16 (06/24 0533) BP: (147)/(91) 147/91 mmHg (06/24 0533) SpO2:  [97 %] 97 % (06/24 0533) Weight:  [133.811 kg (295 lb)] 133.811 kg (295 lb) (06/24 0610)  Labs:   Estimated body mass index is 44.86 kg/(m^2) as calculated from the following:   Height as of this encounter:  (1.727 m).   Weight as of this encounter: 133.811 kg (295 lb).   Imaging Review Plain radiographs demonstrate severe degenerative joint disease of the right knee(s). The overall alignment ismild valgus. The bone quality appears to be good for age and reported activity level.  Assessment/Plan:  End stage arthritis, right knee   The patient history, physical examination, clinical judgment of the provider and imaging studies are consistent with end stage degenerative joint disease of the right knee(s) and total knee arthroplasty is deemed medically necessary. The treatment options including medical management, injection therapy arthroscopy and  arthroplasty were discussed at length. The risks and benefits of total knee arthroplasty were presented and reviewed. The risks due to aseptic loosening, infection, stiffness, patella tracking problems, thromboembolic complications and other imponderables were discussed. The patient acknowledged the explanation, agreed to proceed with the plan and consent was signed. Patient is being admitted for inpatient treatment for surgery, pain control, PT, OT, prophylactic antibiotics, VTE prophylaxis, progressive ambulation and ADL's and discharge planning. The patient is planning to be discharged home with home health services

## 2015-05-01 NOTE — Progress Notes (Signed)
PT Cancellation Note  Patient Details Name: Christian [name] MRN: [id number] DOB: 05-31-1951   Cancelled Treatment:    Reason Eval/Treat Not Completed: Pain limiting ability to participate, repositioned to  Nearer to neutral, R le externally rotated.   Rada Hay 05/01/2015, 4:00 PM

## 2015-05-01 NOTE — Op Note (Signed)
NAME:  DAYMIEN, GOTH NO.:  [id number]  MEDICAL RECORD NO.:  [id number]  LOCATION:                               FACILITY:  [facility]  PHYSICIAN:  Vanita Panda. Magnus Ivan, M.D.DATE OF BIRTH:  17-May-1951  DATE OF PROCEDURE:  05/01/2015 DATE OF DISCHARGE:                              OPERATIVE REPORT   PREOPERATIVE DIAGNOSIS:  Primary osteoarthritis and degenerative joint disease, right knee.  POSTOPERATIVE DIAGNOSIS:  Primary osteoarthritis and degenerative joint disease, right knee.  PROCEDURE:  Right total knee arthroplasty.  IMPLANTS:  Stryker Triathlon knee with size 6 femur, size 7 tibial tray, 13 mm fix bearing polyethylene insert, size 38 patellar button.  SURGEON:  Vanita Panda. Magnus Ivan, M.D.  ASSISTANT:  Richardean Canal, PA-C.  ANESTHESIA:  Spinal.  ANTIBIOTICS:  900 mg of IV Cleocin.  TOURNIQUET TIME:  Less than 1 hour.  ESTIMATED BLOOD LOSS:  Less than 100 mL.  COMPLICATIONS:  None.  INDICATIONS:  Mr. Heidecker is a 64 year old gentleman with debilitating osteoarthritis involving his right knee.  He has radiographic evidence of complete loss of his joint space, periarticular osteophytes, and severe sclerotic changes.  He has tried all forms of conservative treatment and he has failed this.  His pain is daily, his mobility is limited, and now he has had a detrimental effect on his quality of life. He does wish to proceed with a total knee arthroplasty.  He understands the risk of acute blood loss anemia, nerve vessel injury, fracture, infection, and DVT.  He understands the goals are decreased pain, improved mobility, and overall improved quality of life.  DESCRIPTION OF PROCEDURE:  After informed consent was obtained, appropriate right knee was marked, he was brought to the operating room, where spinal anesthesia was obtained.  He was then laid in a supine position on the operating table.  A nonsterile tourniquet was placed around his  upper right thigh and his leg was prepped and draped with DuraPrep and sterile drapes including sterile stockinette.  A time-out was called to identify correct patient, correct right knee.  We then used the Esmarch to wrap out the leg and tourniquet was inflated to 300 mm of pressure.  I then made a direct midline incision over the knee and carried this proximally and distally.  We dissected down to the knee joint and carried out a medial parapatellar arthrotomy, finding a large joint effusion and significant sclerotic changes throughout the knee with essentially no cartilage throughout his knee.  With the knee in a flexed position, we set our extramedullary cutting guide for the tibia to take 9 mm off the high set, correcting varus valgus and neutral slope.  We made this cut without difficulty.  We then went to the femur and set our distal femoral cutting guide for 5 degrees external rotator right and for a 10 mm distal femoral cut.  We made this cut without difficulty and brought the knee back down to full extension with a 9 mm extension block even hyperextended.  All pins were pulled.  We went back to the femur to put our femoral sizing guide based off the epicondylar axis and Whitesides line, we chose a size 6 femur based off  this, we used our 4 in 1 cutting block for size femur and made our anterior posterior cuts followed by our chamfer cuts and then finally our femoral box cut to complete the femur.  We then went back to the tibia and set our tibial rotation off the tibial tubercle and the femur.  We chose a size 7 tibial tray and made our keel punch for this.  With a size 7 tibial tray and a size 6 femur, we trialed a 13 mm polyethylene insert. I was pleased with the stability and range of motion based off the 13 mm insert.  We then made our patellar cut and drilled 3 holes for 38 patellar button.  We then removed all trial components from the knee and irrigated the knee with  normal saline solution using pulsatile lavage. We mixed our cement on the back table and then cemented the real Stryker Triathlon tibial tray, size 7, followed by the real size 6 femur.  We cleaned cement debris from the knee and placed a real fix bearing 13 mm thickness polyethylene insert.  We then cemented our patellar button as well.  Once the cement had hardened with the tourniquet down, hemostasis was obtained with electrocautery, we then irrigated the knee again with normal saline solution using pulsatile lavage.  I then closed the joint capsule with interrupted #1 Vicryl suture, followed by 0 Vicryl in the deep tissue, 2-0 Vicryl in subcutaneous tissue, 4-0 Monocryl subcuticular stitch, and Steri-Strips on the skin.  Well-padded dressing was applied.  He was taken to recovery room in stable condition.  All final counts were correct and no complications noted.  Of note, Rexene Edison, PA-C assisted in the entire case and his assistance was crucial for facilitating all aspects of this case.     Vanita Panda. Magnus Ivan, M.D.     CYB/MEDQ  D:  05/01/2015  T:  05/01/2015  Job:  [id number]

## 2015-05-01 NOTE — Transfer of Care (Signed)
Immediate Anesthesia Transfer of Care Note  Patient: Christian [name]  Procedure(s) Performed: Procedure(s): RIGHT TOTAL KNEE ARTHROPLASTY (Right)  Patient Location: PACU  Anesthesia Type:MAC  Level of Consciousness: Patient easily awoken, sedated, comfortable, cooperative, following commands, responds to stimulation.   Airway & Oxygen Therapy: Patient spontaneously breathing, ventilating well, oxygen via simple oxygen mask.  Post-op Assessment: Report given to PACU RN, vital signs reviewed and stable, spinal L1.   Post vital signs: Reviewed and stable.  Complications: No apparent anesthesia complications

## 2015-05-01 NOTE — Anesthesia Preprocedure Evaluation (Addendum)
Anesthesia Evaluation  Patient identified by MRN, date of birth, ID band Patient awake    Reviewed: Allergy & Precautions, NPO status , Patient's Chart, lab work & pertinent test results  Airway Mallampati: II  TM Distance: >3 FB Neck ROM: Full    Dental no notable dental hx.    Pulmonary neg pulmonary ROS, sleep apnea , former smoker,  breath sounds clear to auscultation  Pulmonary exam normal       Cardiovascular hypertension, Pt. on medications Normal cardiovascular examRhythm:Regular Rate:Normal     Neuro/Psych Seizures - (40 years ago),  negative neurological ROS  negative psych ROS   GI/Hepatic negative GI ROS, Neg liver ROS,   Endo/Other  diabetes, Type 2, Insulin DependentMorbid obesity  Renal/GU negative Renal ROS  negative genitourinary   Musculoskeletal negative musculoskeletal ROS (+)   Abdominal   Peds negative pediatric ROS (+)  Hematology negative hematology ROS (+)   Anesthesia Other Findings   Reproductive/Obstetrics negative OB ROS                            Anesthesia Physical Anesthesia Plan  ASA: III  Anesthesia Plan: Spinal   Post-op Pain Management:    Induction:   Airway Management Planned: Simple Face Mask  Additional Equipment:   Intra-op Plan:   Post-operative Plan:   Informed Consent: I have reviewed the patients History and Physical, chart, labs and discussed the procedure including the risks, benefits and alternatives for the proposed anesthesia with the patient or authorized representative who has indicated his/her understanding and acceptance.   Dental advisory given  Plan Discussed with: CRNA  Anesthesia Plan Comments:         Anesthesia Quick Evaluation

## 2015-05-01 NOTE — Anesthesia Procedure Notes (Signed)
Spinal Patient location during procedure: OR Staffing Anesthesiologist: CARIGNAN, PETER Performed by: anesthesiologist  Preanesthetic Checklist Completed: patient identified, site marked, surgical consent, pre-op evaluation, timeout performed, IV checked, risks and benefits discussed and monitors and equipment checked Spinal Block Patient position: sitting Prep: Betadine Patient monitoring: heart rate, continuous pulse ox and blood pressure Approach: right paramedian Location: L3-4 Injection technique: single-shot Needle Needle type: Spinocan  Needle gauge: 22 G Needle length: 9 cm Additional Notes Expiration date of kit checked and confirmed. Patient tolerated procedure well, without complications.     

## 2015-05-01 NOTE — Brief Op Note (Signed)
05/01/2015  9:13 AM  PATIENT:  Christian [name]  63 y.o. male  PRE-OPERATIVE DIAGNOSIS:  Severe osteoarthritis right knee  POST-OPERATIVE DIAGNOSIS:  Severe osteoarthritis right knee  PROCEDURE:  Procedure(s): RIGHT TOTAL KNEE ARTHROPLASTY (Right)  SURGEON:  Surgeon(s) and Role:    * Kathryne Hitch, MD - Primary  PHYSICIAN ASSISTANT: Rexene Edison, PA-C  ANESTHESIA:   spinal  EBL:  Total I/O In: 1000 [I.V.:1000] Out: 50 [Urine:50]  BLOOD ADMINISTERED:none  DRAINS: none   LOCAL MEDICATIONS USED:  NONE  SPECIMEN:  No Specimen  DISPOSITION OF SPECIMEN:  N/A  COUNTS:  YES  TOURNIQUET:   Total Tourniquet Time Documented: Thigh (Right) - 57 minutes Total: Thigh (Right) - 57 minutes   DICTATION: .Other Dictation: Dictation Number [id number]  PLAN OF CARE: Admit to inpatient   PATIENT DISPOSITION:  PACU - hemodynamically stable.   Delay start of Pharmacological VTE agent (>24hrs) due to surgical blood loss or risk of bleeding: no

## 2015-05-02 LAB — CBC
HEMATOCRIT: 38.9 % — AB (ref 39.0–52.0)
HEMOGLOBIN: 12.5 g/dL — AB (ref 13.0–17.0)
MCH: 31.5 pg (ref 26.0–34.0)
MCHC: 32.1 g/dL (ref 30.0–36.0)
MCV: 98 fL (ref 78.0–100.0)
PLATELETS: 232 10*3/uL (ref 150–400)
RBC: 3.97 MIL/uL — AB (ref 4.22–5.81)
RDW: 14 % (ref 11.5–15.5)
WBC: 16.7 10*3/uL — ABNORMAL HIGH (ref 4.0–10.5)

## 2015-05-02 LAB — GLUCOSE, CAPILLARY
GLUCOSE-CAPILLARY: 221 mg/dL — AB (ref 65–99)
GLUCOSE-CAPILLARY: 153 mg/dL — AB (ref 65–99)
GLUCOSE-CAPILLARY: 151 mg/dL — AB (ref 65–99)
Glucose-Capillary: 182 mg/dL — ABNORMAL HIGH (ref 65–99)
Glucose-Capillary: 184 mg/dL — ABNORMAL HIGH (ref 65–99)
Glucose-Capillary: 188 mg/dL — ABNORMAL HIGH (ref 65–99)

## 2015-05-02 LAB — BASIC METABOLIC PANEL
Anion gap: 8 (ref 5–15)
BUN: 16 mg/dL (ref 6–20)
CHLORIDE: 102 mmol/L (ref 101–111)
CO2: 24 mmol/L (ref 22–32)
Calcium: 8.4 mg/dL — ABNORMAL LOW (ref 8.9–10.3)
Creatinine, Ser: 0.81 mg/dL (ref 0.61–1.24)
GFR calc Af Amer: >60 mL/min (ref >60)
GFR calc non Af Amer: >60 mL/min (ref >60)
GLUCOSE: 195 mg/dL — AB (ref 65–99)
Potassium: 4.1 mmol/L (ref 3.5–5.1)
SODIUM: 134 mmol/L — AB (ref 135–145)

## 2015-05-02 NOTE — Discharge Instructions (Addendum)
Information on my medicine - XARELTO® (Rivaroxaban) ° °This medication education was reviewed with me or my healthcare representative as part of my discharge preparation.  The pharmacist that spoke with me during my hospital stay was:  Christine ° °Why was Xarelto® prescribed for you? °Xarelto® was prescribed for you to reduce the risk of blood clots forming after orthopedic surgery. The medical term for these abnormal blood clots is venous thromboembolism (VTE). ° °What do you need to know about xarelto® ? °Take your Xarelto® ONCE DAILY at the same time every day. °You may take it either with or without food. ° °If you have difficulty swallowing the tablet whole, you may crush it and mix in applesauce just prior to taking your dose. ° °Take Xarelto® exactly as prescribed by your doctor and DO NOT stop taking Xarelto® without talking to the doctor who prescribed the medication.  Stopping without other VTE prevention medication to take the place of Xarelto® may increase your risk of developing a clot. ° °After discharge, you should have regular check-up appointments with your healthcare provider that is prescribing your Xarelto®.   ° °What do you do if you miss a dose? °If you miss a dose, take it as soon as you remember on the same day then continue your regularly scheduled once daily regimen the next day. Do not take two doses of Xarelto® on the same day.  ° °Important Safety Information °A possible side effect of Xarelto® is bleeding. You should call your healthcare provider right away if you experience any of the following: °? Bleeding from an injury or your nose that does not stop. °? Unusual colored urine (red or dark brown) or unusual colored stools (red or black). °? Unusual bruising for unknown reasons. °? A serious fall or if you hit your head (even if there is no bleeding). ° °Some medicines may interact with Xarelto® and might increase your risk of bleeding while on Xarelto®. To help avoid this, consult  your healthcare provider or pharmacist prior to using any new prescription or non-prescription medications, including herbals, vitamins, non-steroidal anti-inflammatory drugs (NSAIDs) and supplements. ° °This website has more information on Xarelto®: www.xarelto.com. ° ° °INSTRUCTIONS AFTER JOINT REPLACEMENT  ° °o Remove items at home which could result in a fall. This includes throw rugs or furniture in walking pathways °o ICE to the affected joint every three hours while awake for 30 minutes at a time, for at least the first 3-5 days, and then as needed for pain and swelling.  Continue to use ice for pain and swelling. You may notice swelling that will progress down to the foot and ankle.  This is normal after surgery.  Elevate your leg when you are not up walking on it.   °o Continue to use the breathing machine you got in the hospital (incentive spirometer) which will help keep your temperature down.  It is common for your temperature to cycle up and down following surgery, especially at night when you are not up moving around and exerting yourself.  The breathing machine keeps your lungs expanded and your temperature down. ° ° °DIET:  As you were doing prior to hospitalization, we recommend a well-balanced diet. ° °DRESSING / WOUND CARE / SHOWERING ° °Keep the surgical dressing until follow up.  The dressing is water proof, so you can shower without any extra covering.  IF THE DRESSING FALLS OFF or the wound gets wet inside, change the dressing with sterile gauze.  Please use   good hand washing techniques before changing the dressing.  Do not use any lotions or creams on the incision until instructed by your surgeon.   ° °ACTIVITY ° °o Increase activity slowly as tolerated, but follow the weight bearing instructions below.   °o No driving for 6 weeks or until further direction given by your physician.  You cannot drive while taking narcotics.  °o No lifting or carrying greater than 10 lbs. until further directed  by your surgeon. °o Avoid periods of inactivity such as sitting longer than an hour when not asleep. This helps prevent blood clots.  °o You may return to work once you are authorized by your doctor.  ° ° ° °WEIGHT BEARING  ° °Weight bearing as tolerated with assist device (walker, cane, etc) as directed, use it as long as suggested by your surgeon or therapist, typically at least 4-6 weeks. ° ° °EXERCISES ° °Results after joint replacement surgery are often greatly improved when you follow the exercise, range of motion and muscle strengthening exercises prescribed by your doctor. Safety measures are also important to protect the joint from further injury. Any time any of these exercises cause you to have increased pain or swelling, decrease what you are doing until you are comfortable again and then slowly increase them. If you have problems or questions, call your caregiver or physical therapist for advice.  ° °Rehabilitation is important following a joint replacement. After just a few days of immobilization, the muscles of the leg can become weakened and shrink (atrophy).  These exercises are designed to build up the tone and strength of the thigh and leg muscles and to improve motion. Often times heat used for twenty to thirty minutes before working out will loosen up your tissues and help with improving the range of motion but do not use heat for the first two weeks following surgery (sometimes heat can increase post-operative swelling).  ° °These exercises can be done on a training (exercise) mat, on the floor, on a table or on a bed. Use whatever works the best and is most comfortable for you.    Use music or television while you are exercising so that the exercises are a pleasant break in your day. This will make your life better with the exercises acting as a break in your routine that you can look forward to.   Perform all exercises about fifteen times, three times per day or as directed.  You should  exercise both the operative leg and the other leg as well. ° °Exercises include: °  °• Quad Sets - Tighten up the muscle on the front of the thigh (Quad) and hold for 5-10 seconds.   °• Straight Leg Raises - With your knee straight (if you were given a brace, keep it on), lift the leg to 60 degrees, hold for 3 seconds, and slowly lower the leg.  Perform this exercise against resistance later as your leg gets stronger.  °• Leg Slides: Lying on your back, slowly slide your foot toward your buttocks, bending your knee up off the floor (only go as far as is comfortable). Then slowly slide your foot back down until your leg is flat on the floor again.  °• Angel Wings: Lying on your back spread your legs to the side as far apart as you can without causing discomfort.  °• Hamstring Strength:  Lying on your back, push your heel against the floor with your leg straight by tightening up the muscles of   your buttocks.  Repeat, but this time bend your knee to a comfortable angle, and push your heel against the floor.  You may put a pillow under the heel to make it more comfortable if necessary.  ° °A rehabilitation program following joint replacement surgery can speed recovery and prevent re-injury in the future due to weakened muscles. Contact your doctor or a physical therapist for more information on knee rehabilitation.  ° ° °CONSTIPATION ° °Constipation is defined medically as fewer than three stools per week and severe constipation as less than one stool per week.  Even if you have a regular bowel pattern at home, your normal regimen is likely to be disrupted due to multiple reasons following surgery.  Combination of anesthesia, postoperative narcotics, change in appetite and fluid intake all can affect your bowels.  ° °YOU MUST use at least one of the following options; they are listed in order of increasing strength to get the job done.  They are all available over the counter, and you may need to use some, POSSIBLY even  all of these options:   ° °Drink plenty of fluids (prune juice may be helpful) and high fiber foods °Colace 100 mg by mouth twice a day  °Senokot for constipation as directed and as needed Dulcolax (bisacodyl), take with full glass of water  °Miralax (polyethylene glycol) once or twice a day as needed. ° °If you have tried all these things and are unable to have a bowel movement in the first 3-4 days after surgery call either your surgeon or your primary doctor.   ° °If you experience loose stools or diarrhea, hold the medications until you stool forms back up.  If your symptoms do not get better within 1 week or if they get worse, check with your doctor.  If you experience "the worst abdominal pain ever" or develop nausea or vomiting, please contact the office immediately for further recommendations for treatment. ° ° °ITCHING:  If you experience itching with your medications, try taking only a single pain pill, or even half a pain pill at a time.  You can also use Benadryl over the counter for itching or also to help with sleep.  ° °TED HOSE STOCKINGS:  Use stockings on both legs until for at least 2 weeks or as directed by physician office. They may be removed at night for sleeping. ° °MEDICATIONS:  See your medication summary on the “After Visit Summary” that nursing will review with you.  You may have some home medications which will be placed on hold until you complete the course of blood thinner medication.  It is important for you to complete the blood thinner medication as prescribed. ° °PRECAUTIONS:  If you experience chest pain or shortness of breath - call 911 immediately for transfer to the hospital emergency department.  ° °If you develop a fever greater that 101 F, purulent drainage from wound, increased redness or drainage from wound, foul odor from the wound/dressing, or calf pain - CONTACT YOUR SURGEON.   °                                                °FOLLOW-UP APPOINTMENTS:  If you do not  already have a post-op appointment, please call the office for an appointment to be seen by your surgeon.  Guidelines for how soon to   be seen are listed in your “After Visit Summary”, but are typically between 1-4 weeks after surgery. ° °OTHER INSTRUCTIONS:  ° °Knee Replacement:  Do not place pillow under knee, focus on keeping the knee straight while resting. CPM instructions: 0-90 degrees, 2 hours in the morning, 2 hours in the afternoon, and 2 hours in the evening. Place foam block, curve side up under heel at all times except when in CPM or when walking.  DO NOT modify, tear, cut, or change the foam block in any way. ° °MAKE SURE YOU:  °• Understand these instructions.  °• Get help right away if you are not doing well or get worse.  ° ° °Thank you for letting us be a part of your medical care team.  It is a privilege we respect greatly.  We hope these instructions will help you stay on track for a fast and full recovery!  ° °

## 2015-05-02 NOTE — Progress Notes (Signed)
Physical Therapy Treatment Patient Details Name: DAYMIAN VESPA MRN: [id number] DOB: 1951/06/16 Today's Date: 05/02/2015    History of Present Illness RTKA    PT Comments    Patient is very reluctant  To  Attempt any ROM exercises.  Follow Up Recommendations  SNF;Supervision/Assistance - 24 hour     Equipment Recommendations  None recommended by PT    Recommendations for Other Services       Precautions / Restrictions Precautions Precautions: Knee Required Braces or Orthoses: Knee Immobilizer - Right Knee Immobilizer - Right: Discontinue once straight leg raise with < 10 degree lag    Mobility             General bed mobility comments: initiated- unable to complete  Transfers                 General transfer comment: not performed  Ambulation/Gait                 Stairs            Wheelchair Mobility    Modified Rankin (Stroke Patients Only)       Balance                                    Cognition Arousal/Alertness: Awake/alert Behavior During Therapy: WFL for tasks assessed/performed Overall Cognitive Status: Within Functional Limits for tasks assessed                      Exercises Total Joint Exercises Ankle Circles/Pumps: AROM;Both;10 reps;Supine Quad Sets: AROM;Both;Supine;10 reps    General Comments        Pertinent Vitals/Pain Pain Score: 8  Pain Location: R knee burning, feels likre stitches are pulling.  Pain Descriptors / Indicators: Burning;Tender Pain Intervention(s): Limited activity within patient's tolerance;Monitored during session    Home Living Family/patient expects to be discharged to:: Private residence Living Arrangements: Spouse/significant other Available Help at Discharge: Family Type of Home: House Home Access: Stairs to enter Entrance Stairs-Rails: Right;Left Home Layout: One level Home Equipment: Environmental consultant - 2 wheels;Cane - single point;Bedside  commode Additional Comments: plans SNF rehab    Prior Function Level of Independence: Independent with assistive device(s)          PT Goals (current goals can now be found in the care plan section) Acute Rehab PT Goals Patient Stated Goal: to walk without pain Progress towards PT goals:  (progress is slow, patient would not attempt to perform ROM exercises.)    Frequency  7X/week    PT Plan Current plan remains appropriate    Co-evaluation             End of Session   Activity Tolerance: Patient limited by pain Patient left: in bed;with call bell/phone within reach     Time: 1355-1407 PT Time Calculation (min) (ACUTE ONLY): 12 min  Charges:  $Therapeutic Exercise: 8-22 mins                    G Codes:      Rada Hay 05/02/2015, 3:53 PM

## 2015-05-02 NOTE — Progress Notes (Signed)
Physical Therapy Treatment Patient Details Name: Christian [name] MRN: [id number] DOB: 1951-02-19 Today's Date: 05/02/2015    History of Present Illness RTKA    PT Comments    Patient c/o increased pain and burning and requesting to return to bed. Patient is not placing weight onto R leg during standing.   Follow Up Recommendations  SNF;Supervision/Assistance - 24 hour     Equipment Recommendations  None recommended by PT    Recommendations for Other Services       Precautions / Restrictions Precautions Precautions: Knee Required Braces or Orthoses: Knee Immobilizer - Right Knee Immobilizer - Right: Discontinue once straight leg raise with < 10 degree lag    Mobility  Bed Mobility Overal bed mobility: Needs Assistance Bed Mobility: Sit to Supine       Sit to supine: Max assist   General bed mobility comments: asist for R leg onto bed  Transfers Overall transfer level: Needs assistance Equipment used: Rolling walker (2 wheeled) Transfers: Sit to/from [employer] Sit to Stand: Mod assist;+2 safety/equipment Stand pivot transfers: Mod assist;+2 safety/equipment       General transfer comment: cues to place  More weight on R leg if tolerated, patient  places only TDWB  Ambulation/Gait Ambulation/Gait assistance: Mod assist;+2 safety/equipment Ambulation Distance (Feet): 5 Feet Assistive device: Rolling walker (2 wheeled) Gait Pattern/deviations: Step-to pattern;Antalgic     General Gait Details: minimal weight tolerated on R leg, cues for sequence   Stairs            Wheelchair Mobility    Modified Rankin (Stroke Patients Only)       Balance                                    Cognition Arousal/Alertness: Awake/alert Behavior During Therapy: WFL for tasks assessed/performed Overall Cognitive Status: Within Functional Limits for tasks assessed                      Exercises Total Joint  Exercises Quad Sets: AROM;Both;5 reps;Supine    General Comments        Pertinent Vitals/Pain Pain Assessment: 0-10 Pain Score: 9  Pain Location: R knee Pain Descriptors / Indicators: Burning Pain Intervention(s): Patient requesting pain meds-RN notified;Limited activity within patient's tolerance    Home Living Family/patient expects to be discharged to:: Private residence Living Arrangements: Spouse/significant other Available Help at Discharge: Family Type of Home: House Home Access: Stairs to enter Entrance Stairs-Rails: Right;Left Home Layout: One level Home Equipment: Environmental consultant - 2 wheels;Cane - single point Additional Comments: plans SNF rehab    Prior Function Level of Independence: Independent with assistive device(s)          PT Goals (current goals can now be found in the care plan section) Acute Rehab PT Goals Patient Stated Goal: to walk without pain PT Goal Formulation: With patient/family Time For Goal Achievement: 05/09/15 Potential to Achieve Goals: Good Progress towards PT goals: Progressing toward goals    Frequency  7X/week    PT Plan Current plan remains appropriate    Co-evaluation             End of Session   Activity Tolerance: Patient limited by pain Patient left: in bed;with call bell/phone within reach;with family/visitor present     Time: 1003-1015 PT Time Calculation (min) (ACUTE ONLY): 12 min  Charges:  $Gait Training: 8-22 mins $Therapeutic Exercise:  8-22 mins $Therapeutic Activity: 8-22 mins                    G Codes:      Sharen Heck PT [phone]  05/02/2015, 10:30 AM

## 2015-05-02 NOTE — Clinical Social Work Note (Signed)
Clinical Social Work Assessment  Patient Details  Name: Christian [name] MRN: [id number] Date of Birth: 02-18-1951  Date of referral:  05/02/15               Reason for consult:  Facility Placement                Permission sought to share information with:  Facility Sport and exercise psychologist, Family Supports Permission granted to share information::  Yes, Verbal Permission Granted  Name::        Agency::     Relationship::     Contact Information:     Housing/Transportation Living arrangements for the past 2 months:  Single Family Home Source of Information:  Patient Patient Interpreter Needed:  None Criminal Activity/Legal Involvement Pertinent to Current Situation/Hospitalization:    Significant Relationships:  Adult Children Lives with:  Spouse Do you feel safe going back to the place where you live?    Need for family participation in patient care:  No (Coment)  Care giving concerns:  Pt's spouse is concerned if pt goes home and falls she would not be able to pick him up   Facilities manager / plan:  CSW met with pt and his spouse at bedside to assess for services.  CSW explained role and protocol for SNF search.  CSW prompted pt and spouse to discuss history and need for services.  CSW provided active and supportive listening.  CSW encouraged pt and spouse to discuss thoughts and feelings related to his hospital stay and need for rehab.  CSW will send pt information to SNF's in both [place] and [employer].  Employment status:  [employer] information:    PT Recommendations:    Information / Referral to community resources:     Patient/Family's Response to care:  Pt and spouse cooperative with discussing history and need for services.  Pt is on FMLA and will be retiring this year as well.  Pt's spouse works full time and cannot care for pt at home.  Pt has two children both adults but stated that are dealing with their own health issues at this time.  Pt is  not happy to be going to a SNF but stated that "my wife can't deal with it" so he agreed to a SNF at discharge.  Pt's wife hopeful that [facility] can accept pt.  Patient/Family's Understanding of and Emotional Response to Diagnosis, Current Treatment, and Prognosis:  Pt appeared upbeat discussing his history and future retirement.  Pt hopeful that he will not need too many days of rehab.  Pt's wife wants pt to be at a place that will take care of him no matter how far a drive for her.    Emotional Assessment Appearance:  Appears stated age Attitude/Demeanor/Rapport:   (cooperative) Affect (typically observed):  Accepting, Pleasant Orientation:  Oriented to  Time, Oriented to Situation, Oriented to Place, Oriented to Self Alcohol / Substance use:    Psych involvement (Current and /or in the community):  No (Comment)  Discharge Needs  Concerns to be addressed:    Readmission within the last 30 days:  No Current discharge risk:  None Barriers to Discharge:  No Barriers Identified   Carlean Jews, LCSW 05/02/2015, 11:36 AM

## 2015-05-02 NOTE — Evaluation (Signed)
Occupational Therapy Evaluation Patient Details Name: Christian [name] MRN: [id number] DOB: Jan 28, 1951 Today's Date: 05/02/2015    History of Present Illness RTKA   Clinical Impression   Pt is s/p TKA resulting in the deficits listed below (see OT Problem List). Pt will benefit from skilled OT to increase their safety and independence with ADL and functional mobility for ADL to facilitate discharge to venue listed below.        Follow Up Recommendations  SNF          Precautions / Restrictions Precautions Precautions: Knee Required Braces or Orthoses: Knee Immobilizer - Right Knee Immobilizer - Right: Discontinue once straight leg raise with < 10 degree lag      Mobility Bed Mobility Overal bed mobility: Needs Assistance Bed Mobility: Supine to Sit       Sit to supine: Max assist   General bed mobility comments: initiated- unable to complete  Transfers Overall transfer level: Needs assistance Equipment used: Rolling walker (2 wheeled) Transfers: Sit to/from [employer] Sit to Stand: Mod assist;+2 safety/equipment Stand pivot transfers: Mod assist;+2 safety/equipment       General transfer comment: not performed         ADL Overall ADL's : Needs assistance/impaired Eating/Feeding: Set up;Sitting   Grooming: Bed level;Set up   Upper Body Bathing: Set up;Bed level   Lower Body Bathing: Maximal assistance;Bed level   Upper Body Dressing : Set up;Bed level   Lower Body Dressing: Maximal assistance;Bed level                 General ADL Comments: Initiated OOB with pt and pain too intense. RN notified.  Pt feels stitches are burning. Explained role of OT and ADL activity s/p TKR. Pt will need post acute rehab. Pt agreable               Pertinent Vitals/Pain Pain Assessment: 0-10 Pain Score: 8  Pain Location: r knee Pain Descriptors / Indicators: Burning Pain Intervention(s): Limited activity within patient's  tolerance;Monitored during session     Hand Dominance     Extremity/Trunk Assessment Upper Extremity Assessment Upper Extremity Assessment: Overall WFL for tasks assessed   Lower Extremity Assessment Lower Extremity Assessment: RLE deficits/detail RLE Deficits / Details: limited ROM, unable to perform SLR   Cervical / Trunk Assessment Cervical / Trunk Assessment: Normal   Communication Communication Communication: No difficulties   Cognition Arousal/Alertness: Awake/alert Behavior During Therapy: WFL for tasks assessed/performed Overall Cognitive Status: Within Functional Limits for tasks assessed                                Home Living Family/patient expects to be discharged to:: Private residence Living Arrangements: Spouse/significant other Available Help at Discharge: Family Type of Home: House Home Access: Stairs to enter Secretary/administrator of Steps: 6 Entrance Stairs-Rails: Right;Left Home Layout: One level     Bathroom Shower/Tub: Chief Strategy Officer: Standard     Home Equipment: Environmental consultant - 2 wheels;Cane - single point;Bedside commode   Additional Comments: plans SNF rehab      Prior Functioning/Environment Level of Independence: Independent with assistive device(s)             OT Diagnosis: Generalized weakness;Acute pain   OT Problem List: Decreased strength;Decreased activity tolerance;Pain   OT Treatment/Interventions: Self-care/ADL training;DME and/or AE instruction;Patient/family education    OT Goals(Current goals can be found in the care plan  section) Acute Rehab OT Goals Patient Stated Goal: to walk without pain OT Goal Formulation: With patient Time For Goal Achievement: 05/16/15  OT Frequency: Min 2X/week   Barriers to D/C:            Co-evaluation              End of Session Nurse Communication: Mobility status  Activity Tolerance: Patient limited by pain Patient left: in bed;with call  bell/phone within reach;with family/visitor present   Time: 7425-9563 OT Time Calculation (min): 14 min Charges:  OT General Charges $OT Visit: 1 Procedure OT Evaluation $Initial OT Evaluation Tier I: 1 Procedure G-Codes:    Alba Cory 05/29/2015, 12:59 PM

## 2015-05-02 NOTE — Evaluation (Signed)
Physical Therapy Evaluation Patient Details Name: Christian [name] MRN: [id number] DOB: 06-Feb-1951 Today's Date: 05/02/2015   History of Present Illness  RTKA  Clinical Impression  Patient unable to tolerate  Very much weight on R leg due to c/o [pain. Patient will benefit from PT to address problems listed in note below.  Wife will not be available to assist at  Home. Patient will benefit from short rehab stay.    Follow Up Recommendations SNF;Supervision/Assistance - 24 hour    Equipment Recommendations  None recommended by PT    Recommendations for Other Services       Precautions / Restrictions Precautions Precautions: Knee Required Braces or Orthoses: Knee Immobilizer - Right Knee Immobilizer - Right: Discontinue once straight leg raise with < 10 degree lag      Mobility  Bed Mobility               General bed mobility comments: up in recliner  Transfers Overall transfer level: Needs assistance Equipment used: Rolling walker (2 wheeled) Transfers: Sit to/from Stand Sit to Stand: Mod assist         General transfer comment: cues for hand placement  Ambulation/Gait Ambulation/Gait assistance: Mod assist;+2 safety/equipment Ambulation Distance (Feet): 5 Feet Assistive device: Rolling walker (2 wheeled) Gait Pattern/deviations: Step-to pattern;Antalgic     General Gait Details: minimal weight tolerated on R leg, cues for sequence  Stairs            Wheelchair Mobility    Modified Rankin (Stroke Patients Only)       Balance                                             Pertinent Vitals/Pain Pain Assessment: 0-10 Pain Score: 5  Pain Descriptors / Indicators: Burning;Aching;Discomfort Pain Intervention(s): Monitored during session;Premedicated before session;Repositioned;Ice applied    Home Living Family/patient expects to be discharged to:: Private residence Living Arrangements: Spouse/significant other Available  Help at Discharge: Family Type of Home: House Home Access: Stairs to enter Entrance Stairs-Rails: Doctor, general practice of Steps: 6 Home Layout: One level Home Equipment: Environmental consultant - 2 wheels;Cane - single point Additional Comments: plans SNF rehab    Prior Function Level of Independence: Independent with assistive device(s)               Hand Dominance        Extremity/Trunk Assessment               Lower Extremity Assessment: RLE deficits/detail RLE Deficits / Details: limited ROM, unable to perform SLR    Cervical / Trunk Assessment: Normal  Communication   Communication: No difficulties  Cognition Arousal/Alertness: Awake/alert Behavior During Therapy: WFL for tasks assessed/performed Overall Cognitive Status: Within Functional Limits for tasks assessed                      General Comments      Exercises Total Joint Exercises Quad Sets: AROM;Both;5 reps;Supine      Assessment/Plan    PT Assessment Patient needs continued PT services  PT Diagnosis Difficulty walking;Acute pain   PT Problem List Decreased strength;Decreased range of motion;Decreased activity tolerance;Decreased mobility;Decreased knowledge of precautions;Decreased safety awareness;Decreased knowledge of use of DME;Pain  PT Treatment Interventions DME instruction;Gait training;Stair training;Functional mobility training;Therapeutic activities;Therapeutic exercise;Patient/family education   PT Goals (Current goals can be found in the Care  Plan section) Acute Rehab PT Goals Patient Stated Goal: to walk without pain PT Goal Formulation: With patient/family Time For Goal Achievement: 05/09/15 Potential to Achieve Goals: Good    Frequency 7X/week   Barriers to discharge        Co-evaluation               End of Session   Activity Tolerance: Patient limited by pain Patient left: in chair;with call bell/phone within reach;with family/visitor present Nurse  Communication: Mobility status         Time: 9753-0051 PT Time Calculation (min) (ACUTE ONLY): 43 min   Charges:   PT Evaluation $Initial PT Evaluation Tier I: 1 Procedure PT Treatments $Gait Training: 8-22 mins $Therapeutic Exercise: 8-22 mins   PT G Codes:        Rada Hay 05/02/2015, 9:57 AM Blanchard Kelch PT [phone]

## 2015-05-02 NOTE — Progress Notes (Signed)
Subjective: 1 Day Post-Op Procedure(s) (LRB): RIGHT TOTAL KNEE ARTHROPLASTY (Right) Patient reports pain as moderate to severe .  Up with PT this morning to the door of the room.   Objective: Vital signs in last 24 hours: Temp:  [97.9 F (36.6 C)-98.5 F (36.9 C)] 98.4 F (36.9 C) (06/25 0600) Pulse Rate:  [67-90] 67 (06/25 0600) Resp:  [16-26] 18 (06/25 0600) BP: (101-132)/(57-75) 114/66 mmHg (06/25 0600) SpO2:  [89 %-100 %] 100 % (06/25 0600)  Intake/Output from previous day: 06/24 0701 - 06/25 0700 In: 4297.5 [P.O.:720; I.V.:3522.5; IV Piggyback:55] Out: 1770 [Urine:1770] Intake/Output this shift: Total I/O In: 360 [P.O.:360] Out: -    Recent Labs  05/02/15 0445  HGB 12.5*    Recent Labs  05/02/15 0445  WBC 16.7*  RBC 3.97*  HCT 38.9*  PLT 232    Recent Labs  05/02/15 0445  NA 134*  K 4.1  CL 102  CO2 24  BUN 16  CREATININE 0.81  GLUCOSE 195*  CALCIUM 8.4*   No results for input(s): LABPT, INR in the last 72 hours.  Sensation intact distally Intact pulses distally Dorsiflexion/Plantar flexion intact Incision: dressing C/D/I Compartment soft  Assessment/Plan: 1 Day Post-Op Procedure(s) (LRB): RIGHT TOTAL KNEE ARTHROPLASTY (Right) Up with therapy    CLARK, GILBERT 05/02/2015, 9:59 AM

## 2015-05-03 LAB — CBC
HCT: 38.8 % — ABNORMAL LOW (ref 39.0–52.0)
Hemoglobin: 12.4 g/dL — ABNORMAL LOW (ref 13.0–17.0)
MCH: 31.4 pg (ref 26.0–34.0)
MCHC: 32 g/dL (ref 30.0–36.0)
MCV: 98.2 fL (ref 78.0–100.0)
Platelets: 225 10*3/uL (ref 150–400)
RBC: 3.95 MIL/uL — ABNORMAL LOW (ref 4.22–5.81)
RDW: 13.9 % (ref 11.5–15.5)
WBC: 16.5 10*3/uL — ABNORMAL HIGH (ref 4.0–10.5)

## 2015-05-03 LAB — GLUCOSE, CAPILLARY
GLUCOSE-CAPILLARY: 166 mg/dL — AB (ref 65–99)
Glucose-Capillary: 122 mg/dL — ABNORMAL HIGH (ref 65–99)
Glucose-Capillary: 140 mg/dL — ABNORMAL HIGH (ref 65–99)
Glucose-Capillary: 117 mg/dL — ABNORMAL HIGH (ref 65–99)

## 2015-05-03 NOTE — Progress Notes (Signed)
Subjective: 2 Days Post-Op Procedure(s) (LRB): RIGHT TOTAL KNEE ARTHROPLASTY (Right) Patient reports pain as moderate.  Slow progress with PT.  Objective: Vital signs in last 24 hours: Temp:  [98.6 F (37 C)-99.7 F (37.6 C)] 98.6 F (37 C) (06/26 0600) Pulse Rate:  [76-99] 77 (06/26 0600) Resp:  [18-19] 18 (06/26 0600) BP: (115-139)/(68-77) 116/68 mmHg (06/26 0600) SpO2:  [90 %-93 %] 93 % (06/26 0600)  Intake/Output from previous day: 06/25 0701 - 06/26 0700 In: 1765 [P.O.:960; I.V.:805] Out: 1475 [Urine:1475] Intake/Output this shift:     Recent Labs  05/02/15 0445 05/03/15 0449  HGB 12.5* 12.4*    Recent Labs  05/02/15 0445 05/03/15 0449  WBC 16.7* 16.5*  RBC 3.97* 3.95*  HCT 38.9* 38.8*  PLT 232 225    Recent Labs  05/02/15 0445  NA 134*  K 4.1  CL 102  CO2 24  BUN 16  CREATININE 0.81  GLUCOSE 195*  CALCIUM 8.4*   No results for input(s): LABPT, INR in the last 72 hours.  Sensation intact distally Intact pulses distally Dorsiflexion/Plantar flexion intact Incision: no drainage Compartment soft  Assessment/Plan: 2 Days Post-Op Procedure(s) (LRB): RIGHT TOTAL KNEE ARTHROPLASTY (Right) Discharge to SNF possibly tomorrow.  Up with PT  Richardean Canal 05/03/2015, 9:23 AM

## 2015-05-03 NOTE — Progress Notes (Signed)
Physical Therapy Treatment Patient Details Name: Christian [name] MRN: [id number] DOB: 02-13-1951 Today's Date: 05/03/2015    History of Present Illness RTKA    PT Comments    Pt cooperative and progressing slowly with mobility 2* pain and ltd WB tolerance on R LE.  Follow Up Recommendations  SNF;Supervision/Assistance - 24 hour     Equipment Recommendations  None recommended by PT    Recommendations for Other Services       Precautions / Restrictions Precautions Precautions: Knee Required Braces or Orthoses: Knee Immobilizer - Right Knee Immobilizer - Right: Discontinue once straight leg raise with < 10 degree lag Restrictions Weight Bearing Restrictions: No Other Position/Activity Restrictions: WBAT    Mobility  Bed Mobility Overal bed mobility: Needs Assistance Bed Mobility: Supine to Sit     Supine to sit: Mod assist     General bed mobility comments: cues for sequence and use of L LE to self assist.  Pt utilizing bed rails to complete task  Transfers Overall transfer level: Needs assistance Equipment used: Rolling walker (2 wheeled) Transfers: Sit to/from Stand Sit to Stand: Mod assist;+2 safety/equipment;From elevated surface         General transfer comment: cues for LE management and use of UEs to self assist  Ambulation/Gait Ambulation/Gait assistance: Min assist;Mod assist;+2 safety/equipment Ambulation Distance (Feet): 23 Feet Assistive device: Rolling walker (2 wheeled) Gait Pattern/deviations: Step-to pattern;Decreased step length - left;Decreased stance time - left;Shuffle;Trunk flexed     General Gait Details: cues for sequence, posture, position from RW, stride length, step-to gait, pacing.  Pt tolerating min weight on R LE   Stairs            Wheelchair Mobility    Modified Rankin (Stroke Patients Only)       Balance                                    Cognition Arousal/Alertness: Awake/alert Behavior  During Therapy: WFL for tasks assessed/performed Overall Cognitive Status: Within Functional Limits for tasks assessed                      Exercises Total Joint Exercises Ankle Circles/Pumps: AROM;Both;10 reps;Supine Quad Sets: AROM;Both;Supine;10 reps Heel Slides: AAROM;Right;10 reps;Supine Straight Leg Raises: AAROM;Right;10 reps;Supine Goniometric ROM: AAROM at right knee -10 - 30    General Comments        Pertinent Vitals/Pain Pain Assessment: 0-10 Pain Score: 7  Pain Location: R knee Pain Descriptors / Indicators: Aching;Burning;Sore Pain Intervention(s): Limited activity within patient's tolerance;Monitored during session;Premedicated before session;Ice applied    Home Living                      Prior Function            PT Goals (current goals can now be found in the care plan section) Acute Rehab PT Goals Patient Stated Goal: to walk without pain PT Goal Formulation: With patient/family Time For Goal Achievement: 05/09/15 Potential to Achieve Goals: Good Progress towards PT goals: Progressing toward goals    Frequency  7X/week    PT Plan Current plan remains appropriate    Co-evaluation             End of Session Equipment Utilized During Treatment: Gait belt;Right knee immobilizer Activity Tolerance: Patient tolerated treatment well;Patient limited by fatigue;Patient limited by pain Patient left: in chair;with call  bell/phone within reach     Time: 0933-1005 PT Time Calculation (min) (ACUTE ONLY): 32 min  Charges:  $Gait Training: 8-22 mins $Therapeutic Exercise: 8-22 mins                    G Codes:      Adayah Arocho 2015-05-12, 10:59 AM

## 2015-05-03 NOTE — Progress Notes (Signed)
Physical Therapy Treatment Patient Details Name: Christian [name] MRN: [id number] DOB: 06-29-1951 Today's Date: May 27, 2015    History of Present Illness RTKA    PT Comments    Progressing slowly - pt ltd this pm by pain and c/o upset stomach.  Follow Up Recommendations  SNF;Supervision/Assistance - 24 hour     Equipment Recommendations  None recommended by PT    Recommendations for Other Services       Precautions / Restrictions Precautions Precautions: Knee Required Braces or Orthoses: Knee Immobilizer - Right Knee Immobilizer - Right: Discontinue once straight leg raise with < 10 degree lag Restrictions Weight Bearing Restrictions: No Other Position/Activity Restrictions: WBAT    Mobility  Bed Mobility               General bed mobility comments: OOB deferred 2* pt c/o stomach upset  Transfers                    Ambulation/Gait                 Stairs            Wheelchair Mobility    Modified Rankin (Stroke Patients Only)       Balance                                    Cognition Arousal/Alertness: Awake/alert Behavior During Therapy: WFL for tasks assessed/performed Overall Cognitive Status: Within Functional Limits for tasks assessed                      Exercises Total Joint Exercises Ankle Circles/Pumps: AROM;Both;10 reps;Supine Quad Sets: AROM;Both;Supine;10 reps Heel Slides: AAROM;Right;10 reps;Supine Straight Leg Raises: AAROM;Right;10 reps;Supine    General Comments        Pertinent Vitals/Pain Pain Assessment: 0-10 Pain Score: 7  Pain Location: R knee Pain Descriptors / Indicators: Aching;Sore Pain Intervention(s): Limited activity within patient's tolerance;Monitored during session;Premedicated before session;Ice applied    Home Living                      Prior Function            PT Goals (current goals can now be found in the care plan section) Acute Rehab PT  Goals Patient Stated Goal: to walk without pain PT Goal Formulation: With patient/family Time For Goal Achievement: 05/09/15 Potential to Achieve Goals: Good Progress towards PT goals: Progressing toward goals    Frequency  7X/week    PT Plan Current plan remains appropriate    Co-evaluation             End of Session   Activity Tolerance: Patient limited by pain Patient left: in bed;with call bell/phone within reach;with family/visitor present     Time: 1417-1436 PT Time Calculation (min) (ACUTE ONLY): 19 min  Charges:  $Therapeutic Exercise: 8-22 mins                    G Codes:      Jackeline Gutknecht 05-27-2015, 3:12 PM

## 2015-05-04 ENCOUNTER — Encounter (HOSPITAL_COMMUNITY): Payer: Self-pay | Admitting: Orthopaedic Surgery

## 2015-05-04 LAB — CBC
HCT: 38.1 % — ABNORMAL LOW (ref 39.0–52.0)
Hemoglobin: 12.5 g/dL — ABNORMAL LOW (ref 13.0–17.0)
MCH: 31.6 pg (ref 26.0–34.0)
MCHC: 32.8 g/dL (ref 30.0–36.0)
MCV: 96.2 fL (ref 78.0–100.0)
PLATELETS: 218 10*3/uL (ref 150–400)
RBC: 3.96 MIL/uL — AB (ref 4.22–5.81)
RDW: 13.6 % (ref 11.5–15.5)
WBC: 18 10*3/uL — ABNORMAL HIGH (ref 4.0–10.5)

## 2015-05-04 LAB — GLUCOSE, CAPILLARY
GLUCOSE-CAPILLARY: 134 mg/dL — AB (ref 65–99)
GLUCOSE-CAPILLARY: 108 mg/dL — AB (ref 65–99)
Glucose-Capillary: 135 mg/dL — ABNORMAL HIGH (ref 65–99)

## 2015-05-04 MED ORDER — SORBITOL 70 % SOLN
30.0000 mL | Freq: Every day | Status: DC | PRN
  Administered 2015-05-04: 30 mL via ORAL
  Filled 2015-05-04 (×2): qty 30

## 2015-05-04 MED ORDER — BISACODYL 10 MG RE SUPP
10.0000 mg | Freq: Every day | RECTAL | Status: DC | PRN
Start: 2015-05-04 — End: 2015-05-05

## 2015-05-04 NOTE — Progress Notes (Signed)
Patient ID: Christian BarmanGeorge E [name], male   DOB: 07/27/1951, 64 y.o.   MRN: [id number] Doing well other than discomfort from no BM.  WBC up to 18, but vitals stable.  Abdomin soft.  Right knee stable.  Unfortunately, will need to keep here today to work on bowels and will check CBC in am 6/28.  Anticipate being able to discharge to SNF tomorrow.

## 2015-05-04 NOTE — Progress Notes (Signed)
Clinical Social Work  [facility] to [employer] expiring 05/07/15 and changing to [employer]. [facility] able to offer a bed. CSW spoke with patient and provided bed offers. [facility]  [facility] to start authorization and can accept tomorrow if stable.  [place] H[place]ba Schram, [place] [phone]

## 2015-05-04 NOTE — Progress Notes (Signed)
Physical Therapy Treatment Patient Details Name: Christian BarmanGeorge E [name] MRN: [id number] DOB: 12/07/1950 Today's Date: 05/04/2015    History of Present Illness RTKA    PT Comments    POD # 3 pm session.  Applied KI and assisted with amb in hallway a greater distance however barely made it back.  MAX c/o fatigue and near fall backward onto bed.  Assisted back to bed and applied  ICE.  Pt will need ST Rehab at SNF prior to returning home.  Follow Up Recommendations  SNF     Equipment Recommendations       Recommendations for Other Services       Precautions / Restrictions Precautions Precautions: Knee Precaution Comments: instructed on proper use of KI for amb Required Braces or Orthoses: Knee Immobilizer - Right Knee Immobilizer - Right: Discontinue once straight leg raise with < 10 degree lag Restrictions Weight Bearing Restrictions: No Other Position/Activity Restrictions: WBAT    Mobility  Bed Mobility Overal bed mobility: Needs Assistance Bed Mobility: Supine to Sit     Supine to sit: Mod assist     General bed mobility comments: assist to support R LE and increased time  Transfers Overall transfer level: Needs assistance Equipment used: Rolling walker (2 wheeled) Transfers: Sit to/from Stand Sit to Stand: Mod assist;From elevated surface         General transfer comment: cues for LE management and use of UEs to self assist  Ambulation/Gait Ambulation/Gait assistance: Min assist Ambulation Distance (Feet): 24 Feet Assistive device: Rolling walker (2 wheeled) Gait Pattern/deviations: Step-to pattern;Decreased stance time - right;Trunk flexed Gait velocity: decreased   General Gait Details: 25% VC's on proper walker to self distance and upright posture.  pt tolerated minimal WBing thru r LE due to pain level.   Stairs            Wheelchair Mobility    Modified Rankin (Stroke Patients Only)       Balance                                     Cognition Arousal/Alertness: Awake/alert Behavior During Therapy: WFL for tasks assessed/performed Overall Cognitive Status: Within Functional Limits for tasks assessed                      Exercises      General Comments        Pertinent Vitals/Pain Pain Assessment: 0-10 Pain Score: 8  Pain Location: R knee Pain Descriptors / Indicators: Sore;Tender Pain Intervention(s): Monitored during session;Premedicated before session;Repositioned;Ice applied    Home Living                      Prior Function            PT Goals (current goals can now be found in the care plan section) Progress towards PT goals: Progressing toward goals    Frequency  7X/week    PT Plan Current plan remains appropriate    Co-evaluation             End of Session Equipment Utilized During Treatment: Gait belt;Right knee immobilizer Activity Tolerance: Patient limited by pain Patient left: in bed;with call bell/phone within reach;with family/visitor present     Time: 1540-1609 PT Time Calculation (min) (ACUTE ONLY): 29 min  Charges:  $Gait Training: 8-22 mins $Therapeutic Activity: 8-22 mins  G Codes:      Rica Koyanagi  PTA [facility]  Acute  Rehab Pager      [phone]

## 2015-05-04 NOTE — Progress Notes (Signed)
OT Cancellation Note  Patient Details Name: Christian [name] MRN: [id number] DOB: 01/21/1951   Cancelled Treatment:    Reason Eval/Treat Not Completed: Fatigue/lethargy limiting ability to participate  Pt states he had just gotten back to bed.  [name], [name] 05/04/2015, 3:14 PM

## 2015-05-04 NOTE — Progress Notes (Signed)
Date:  May 04, 2015 U.R. performed for needs and level of care. Will continue to follow for Case Management needs.  Teirra Carapia, RN, BSN, CCM   [phone] 

## 2015-05-04 NOTE — Clinical Social Work Placement (Signed)
   CLINICAL SOCIAL WORK PLACEMENT  NOTE  Date:  05/04/2015  Patient Details  Name: Christian [name] MRN: [id number] Date of Birth: [birth year]  Clinical Social Work is seeking post-discharge placement for this patient at the Skilled  Nursing Facility level of care (*CSW will initial, date and re-position this form in  chart as items are completed):  Yes   Patient/family provided with [place] Clinical Social Work Department's list of facilities offering this level of care within the geographic area requested by the patient (or if unable, by the patient's family).  Yes   Patient/family informed of their freedom to choose among providers that offer the needed level of care, that participate in Medicare, Medicaid or managed care program needed by the patient, have an available bed and are willing to accept the patient.  Yes   Patient/family informed of [facility]'s ownership interest in [facility] and [facility], as well as of the fact that they are under no obligation to receive care at these facilities.  PASRR submitted to EDS on 05/02/15     PASRR number received on 05/02/15     Existing PASRR number confirmed on       FL2 transmitted to all facilities in geographic area requested by pt/family on 05/02/15     FL2 transmitted to all facilities within larger geographic area on 05/02/15     Patient informed that his/her managed care company has contracts with or will negotiate with certain facilities, including the following:        Yes   Patient/family informed of bed offers received.  Patient [facility] bed at [facility]     Physician recommends and patient chooses bed at      Patient to be transferred to [facility] on  .  Patient to be transferred to facility by       Patient family notified on   of transfer.  Name of family member notified:        PHYSICIAN       Additional Comment:    _______________________________________________ Marnee Spring, LCSW 05/04/2015, 11:49 AM

## 2015-05-04 NOTE — Progress Notes (Signed)
Clinical Social Work  CSW met with patient and provided bed offers. Patient chose [facility] for rehab. Patient called wife on phone in room who is aware and agreeable to plans as well. CSW faxed updated PT/OT notes to [facility] who will start authorization. CSW will continue to follow and assist with DC when stable.  [place], [place] [phone]

## 2015-05-04 NOTE — Progress Notes (Signed)
Physical Therapy Treatment Patient Details Name: Christian [name] MRN: [id number] DOB: Mar 14, 1951 Today's Date: 05/04/2015    History of Present Illness RTKA    PT Comments    POD # 3 pt progressing slowly with new onset GI issue of stomach cramps and inability to have BM.  Assisted pt with TKR TE's then OOB to amb a limited distance in hallway.   Pt will need ST Rehab at SNF  Follow Up Recommendations  SNF     Equipment Recommendations       Recommendations for Other Services       Precautions / Restrictions Precautions Precautions: Knee Precaution Comments: instructed on proper use of KI for amb Required Braces or Orthoses: Knee Immobilizer - Right Knee Immobilizer - Right: Discontinue once straight leg raise with < 10 degree lag Restrictions Weight Bearing Restrictions: No Other Position/Activity Restrictions: WBAT    Mobility  Bed Mobility Overal bed mobility: Needs Assistance Bed Mobility: Supine to Sit     Supine to sit: Mod assist     General bed mobility comments: assist to support R LE and increased time  Transfers Overall transfer level: Needs assistance Equipment used: Rolling walker (2 wheeled) Transfers: Sit to/from Stand Sit to Stand: Mod assist;From elevated surface         General transfer comment: cues for LE management and use of UEs to self assist  Ambulation/Gait Ambulation/Gait assistance: Min assist Ambulation Distance (Feet): 17 Feet Assistive device: Rolling walker (2 wheeled) Gait Pattern/deviations: Step-to pattern;Decreased stance time - right;Trunk flexed Gait velocity: decreased   General Gait Details: 25% VC's on proper walker to self distance and upright posture.  pt tolerated minimal WBing thru r LE due to pain level.   Stairs            Wheelchair Mobility    Modified Rankin (Stroke Patients Only)       Balance                                    Cognition Arousal/Alertness:  Awake/alert Behavior During Therapy: WFL for tasks assessed/performed Overall Cognitive Status: Within Functional Limits for tasks assessed                      Exercises   Total Knee Replacement TE's 10 reps B LE ankle pumps 10 reps towel squeezes 10 reps knee presses 10 reps heel slides  10 reps SAQ's 10 reps SLR's 10 reps ABD Followed by ICE     General Comments        Pertinent Vitals/Pain Pain Assessment: 0-10 Pain Score: 8  Pain Location: R knee Pain Descriptors / Indicators: Sore;Tender Pain Intervention(s): Monitored during session;Premedicated before session;Repositioned;Ice applied    Home Living                      Prior Function            PT Goals (current goals can now be found in the care plan section) Progress towards PT goals: Progressing toward goals    Frequency  7X/week    PT Plan Current plan remains appropriate    Co-evaluation             End of Session Equipment Utilized During Treatment: Gait belt;Right knee immobilizer Activity Tolerance: Patient limited by pain Patient left: in bed;with call bell/phone within reach;with family/visitor present  Time: 1020-1100 PT Time Calculation (min) (ACUTE ONLY): 40 min  Charges:  $Gait Training: 8-22 mins $Therapeutic Exercise: 8-22 mins $Therapeutic Activity: 8-22 mins                    G Codes:      Felecia Shelling  PTA [facility]  Acute  Rehab Pager      [phone]

## 2015-05-05 LAB — CBC
HEMATOCRIT: 37 % — AB (ref 39.0–52.0)
Hemoglobin: 11.6 g/dL — ABNORMAL LOW (ref 13.0–17.0)
MCH: 30.6 pg (ref 26.0–34.0)
MCHC: 31.4 g/dL (ref 30.0–36.0)
MCV: 97.6 fL (ref 78.0–100.0)
PLATELETS: 231 10*3/uL (ref 150–400)
RBC: 3.79 MIL/uL — ABNORMAL LOW (ref 4.22–5.81)
RDW: 13.8 % (ref 11.5–15.5)
WBC: 16.8 10*3/uL — ABNORMAL HIGH (ref 4.0–10.5)

## 2015-05-05 LAB — GLUCOSE, CAPILLARY
GLUCOSE-CAPILLARY: 144 mg/dL — AB (ref 65–99)
Glucose-Capillary: 134 mg/dL — ABNORMAL HIGH (ref 65–99)
Glucose-Capillary: 161 mg/dL — ABNORMAL HIGH (ref 65–99)

## 2015-05-05 MED ORDER — OXYCODONE HCL 5 MG PO TABS: 5.0000 mg | ORAL_TABLET | ORAL | Status: DC | PRN

## 2015-05-05 MED ORDER — RIVAROXABAN 10 MG PO TABS: 10.0000 mg | ORAL_TABLET | Freq: Every day | ORAL | Status: DC

## 2015-05-05 MED ORDER — METHOCARBAMOL 500 MG PO TABS: 500.0000 mg | ORAL_TABLET | Freq: Four times a day (QID) | ORAL | Status: DC | PRN

## 2015-05-05 MED ORDER — DOCUSATE SODIUM 100 MG PO CAPS: 100.0000 mg | ORAL_CAPSULE | Freq: Two times a day (BID) | ORAL | Status: DC

## 2015-05-05 NOTE — Progress Notes (Signed)
OT Cancellation Note  Patient Details Name: Elba BarmanGeorge E Baird MRN: [id number] DOB: 04/17/1951   Cancelled Treatment:    Reason Eval/Treat Not Completed: Other (comment).  Pt is dressed and plans SNF today.  No acute needs prior to discharge today  Calven Gilkes 05/05/2015, 3:31 PM  Marica OtterMaryellen Gabrial Poppell, OTR/L [phone] 05/05/2015

## 2015-05-05 NOTE — Progress Notes (Addendum)
EDCM received phone call from unit SW hor assistance with home health services as insurance has not given approval for SNF and patient has decided to go home at this time.  [facility] for home health orders and equipment if needed.  Awaiting call back.  05/05/2015 A. Osmel Dykstra RNCM 1723pm EDCM received phone call back from RN for Dr. Magnus IvanBlackman as hew was in surgery.  Per Dr. Magnus IvanBlackman,  [facility] may put in appropriate orders for home health services and equipment.  EDCM asked Rn to ask Dr. Magnus IvanBlackman to cosign orders.  RN reports that he will tell him.  05/05/2015 A. Ysabel Cowgill RNCM 1752pm EDCM placed call to Dr. Magnus IvanBlackman regarding if patient needs to have a CPM machine at home.  [facility] at this time by Dr. Magnus IvanBlackman reporting that patient does not need CPM at home.  EDCM made Dr. Magnus IvanBlackman aware that patient will be returning home due to insurance approval issues for SNF.  EDCM informed Dr. Magnus IvanBlackman that home health orders for RN, PT, OT, aide and social worker, wheelchair and walker will be placed for patient.  EDCM asked if there was any other equipment the patient may need at home?  No further equipment per Dr. Magnus IvanBlackman.    05/05/2015 A. Juleon Narang RNCM 1826pm  Patient to be discharged home to patient's son's home.  Patient reports that he will be sleeping on a "big sofa"  Which is close to the bathroom.  Patient reports his wife will be taking leave of absence from work for a week to be at home with him and son's girlfriend will be with him at home as well.  Patient reports he has a bedside commode at home.  Patient did not have preference as to which home health agency to choose.  [facility] chosen for home health services.  [facility] with contact information [facility], [facility] him.  EDCM spoke to  Patient's son who confirms [place] [address] [place] [id number].  EDCM informed [facility] staff patient reports he has medications  in the pharmacy. Also informed staff to have RN look at patient's dressing to right knee as patient's son reports "there is blood pooling" there.  No further EDCm needs at this time.

## 2015-05-05 NOTE — Progress Notes (Signed)
Clinical Social Work  CSW spoke with patient at bedside and explained no insurance authorization received. CSW spoke with insurance company and SNF who reports no approval granted for SNF and authorization continues to pend. Insurance reports no ability to expedite process. Patient spoke with wife and has decided to return home. CSW spoke with CM who will [facility].  CSW is signing off but available if needed.  PeckHolly Rance [name], [place] [phone]

## 2015-05-05 NOTE — Plan of Care (Signed)
Problem: Discharge Progression Outcomes Goal: Staples/sutures removed Outcome: Not Met (add Reason) Will return to MD for post op visit

## 2015-05-05 NOTE — Progress Notes (Signed)
Physical Therapy Treatment Patient Details Name: Christian BarmanGeorge E [name] MRN: [id number] DOB: 03/12/1951 Today's Date: 05/05/2015    History of Present Illness RTKA    PT Comments    POD # 4 pt progressing slowly due to pain control and BMI.  Applied KI as pt still unable to perform active SLR.  Assissted OOB to amb to BR with increased time and excessive WBing thru AD due to pain R knee.  Assisted with sit to stand and stand to sit on/off commode.  Unsteady.  Assisted with amb to recliner only due to MAX c/o fatigue.  Positioned in recliner and performed some TKR TE's followed by ICE.   Follow Up Recommendations  SNF     Equipment Recommendations       Recommendations for Other Services       Precautions / Restrictions Precautions Precautions: Knee Precaution Comments: instructed on proper use of KI for amb Required Braces or Orthoses: Knee Immobilizer - Right Restrictions Weight Bearing Restrictions: No Other Position/Activity Restrictions: WBAT    Mobility  Bed Mobility Overal bed mobility: Needs Assistance Bed Mobility: Supine to Sit     Supine to sit: Min assist;Mod assist     General bed mobility comments: assist to support R LE and increased time  Transfers Overall transfer level: Needs assistance Equipment used: Rolling walker (2 wheeled) Transfers: Sit to/from Stand Sit to Stand: Mod assist;From elevated surface         General transfer comment: cues for LE management and use of UEs to self assist  Ambulation/Gait Ambulation/Gait assistance: Min assist Ambulation Distance (Feet): 16 Feet (to and from bathroom only this session ) Assistive device: Rolling walker (2 wheeled) Gait Pattern/deviations: Step-to pattern;Decreased stance time - right Gait velocity: decreased   General Gait Details: 25% VC's on proper walker to self distance and upright posture.  pt tolerated minimal WBing thru r LE due to pain level.   Stairs            Wheelchair  Mobility    Modified Rankin (Stroke Patients Only)       Balance                                    Cognition Arousal/Alertness: Awake/alert Behavior During Therapy: WFL for tasks assessed/performed Overall Cognitive Status: Within Functional Limits for tasks assessed                      Exercises   Total Knee Replacement TE's 10 reps B LE ankle pumps 10 reps towel squeezes 10 reps knee presses 10 reps heel slides  10 reps SLR's  Followed by ICE     General Comments        Pertinent Vitals/Pain Pain Assessment: 0-10 Pain Score: 7  Pain Location: R knee Pain Descriptors / Indicators: Sore;Tightness;Tender Pain Intervention(s): Monitored during session;Repositioned;Ice applied    Home Living                      Prior Function            PT Goals (current goals can now be found in the care plan section) Progress towards PT goals: Progressing toward goals    Frequency  7X/week    PT Plan      Co-evaluation             End of Session Equipment Utilized During Treatment:  Gait belt;Right knee immobilizer Activity Tolerance: Patient limited by pain Patient left: in chair;with call bell/phone within reach     Time: 1005-1030 PT Time Calculation (min) (ACUTE ONLY): 25 min  Charges:  $Gait Training: 8-22 mins $Therapeutic Activity: 8-22 mins                    G Codes:      Felecia Shelling  PTA [facility]  Acute  Rehab Pager      [phone]

## 2015-05-05 NOTE — Progress Notes (Signed)
Clinical Social Work  [facility] CSW spoke with patient who is happy to be close to home. [facility] to complete paperwork with patient. SNF waiting on insurance authorization but was informed by insurance company that auth should be received this afternoon. CSW updated patient and RN on DC plans.  [place]olly Seon [name], [place] [phone]

## 2015-05-05 NOTE — Care Management Note (Signed)
Case Management Note  Patient Details  Name: Christian [name] E Leclaire MRN: [id number] Date of Birth: 08/10/1951  Subjective/Objective: Patient to be discharged home with home health services/equipment                   Action/Plan: Discussed home health services and equipment with patient at bedside.   Expected Discharge Date:   05/05/2015               Expected Discharge Plan:  Home w Home Health Services  In-House Referral:     Discharge planning Services  CM Consult  Post Acute Care Choice:  Durable Medical Equipment, Home Health Choice offered to:  Patient  DME Arranged:  Walker rolling, Government social research officerWheelchair manual DME Agency:  [facility]  HH Arranged:  RN, PT, OT, Nurse's Aide (social work) [employer] Agency:  [facility]  Status of Service:  Completed, signed off  Medicare Important Message Given:    Date Medicare IM Given:    Medicare IM give by:    Date Additional Medicare IM Given:    Additional Medicare Important Message give by:     If discussed at Long Length of Stay Meetings, dates discussed:    Additional Comments:  See other Case Management note.  EDCM provided patient with rolling walker (same used here in hospital) prior to discharge.    Radford PaxFERRERO, Novah Goza, RN 05/05/2015, 6:34 PM

## 2015-05-05 NOTE — Progress Notes (Signed)
Patient ID: Christian BarmanGeorge E Taranto, male   DOB: 05/08/1951, 64 y.o.   MRN: [id number] Continues to improve.  Vitals and labs stable.  Right knee stable.  Can discharge to SNF today for rehab.

## 2015-05-05 NOTE — Discharge Summary (Signed)
Patient ID: Christian BarmanGeorge E Hubble MRN: [id number] DOB/AGE: 65/04/1951 65 y.o.  Admit date: 05/01/2015 Discharge date: 05/05/2015  Admission Diagnoses:  Principal Problem:   Osteoarthritis of right knee Active Problems:   Status post total right knee replacement   Discharge Diagnoses:  Same  Past Medical History  Diagnosis Date  . Hypertension   . Seizures   . Type 1 diabetes mellitus, uncontrolled   . Gout 09/13/2013  . Chronic pain syndrome 09/13/2013  . Osteoarthritis 09/13/2013  . Chronic back pain 09/13/2013  . Morbid obesity with body mass index of 45.0-49.9 in adult 09/13/2013  . Sleep apnea     has never used C-pap machine due to insurance cost  . Pneumonia     hx. of  . GERD (gastroesophageal reflux disease)     hx. of  . Hemorrhoids     Surgeries: Procedure(s): RIGHT TOTAL KNEE ARTHROPLASTY on 05/01/2015   Consultants:    Discharged Condition: Improved  Hospital Course: Christian [name] is an 64 y.o. male who was admitted 05/01/2015 for operative treatment ofOsteoarthritis of right knee. Patient has severe unremitting pain that affects sleep, daily activities, and work/hobbies. After pre-op clearance the patient was taken to the operating room on 05/01/2015 and underwent  Procedure(s): RIGHT TOTAL KNEE ARTHROPLASTY.    Patient was given perioperative antibiotics: Anti-infectives    Start     Dose/Rate Route Frequency Ordered Stop   05/01/15 1400  clindamycin (CLEOCIN) IVPB 600 mg     600 mg 100 mL/hr over 30 Minutes Intravenous Every 6 hours 05/01/15 1106 05/01/15 2116   05/01/15 0607  clindamycin (CLEOCIN) IVPB 900 mg     900 mg 100 mL/hr over 30 Minutes Intravenous On call to O.R. 05/01/15 [id number] 05/01/15 0740       Patient was given sequential compression devices, early ambulation, and chemoprophylaxis to prevent DVT.  Patient benefited maximally from hospital stay and there were no complications.    Recent vital signs: Patient Vitals for the past 24  hrs:  BP Temp Temp src Pulse Resp SpO2  05/05/15 0540 131/89 mmHg 99.1 F (37.3 C) Oral 86 20 94 %  05/04/15 1410 127/75 mmHg 98.2 F (36.8 C) Oral 84 18 90 %  05/04/15 1020 108/85 mmHg - - 89 - -  05/04/15 0844 94/70 mmHg 98.2 F (36.8 C) Oral 90 18 92 %     Recent laboratory studies:  Recent Labs  05/04/15 0515 05/05/15 0511  WBC 18.0* 16.8*  HGB 12.5* 11.6*  HCT 38.1* 37.0*  PLT 218 231     Discharge Medications:     Medication List    STOP taking these medications        aspirin 81 MG chewable tablet     oxyCODONE-acetaminophen 5-325 MG per tablet  Commonly known as:  PERCOCET/ROXICET      TAKE these medications        bisoprolol-hydrochlorothiazide 10-6.25 MG per tablet  Commonly known as:  ZIAC  TAKE 1 TABLET EVERY MORNING (NEEDS OFFICE VISIT IN MAY)     Diclofenac-Misoprostol 75-0.2 MG Tbec  TAKE 1 TABLET BY MOUTH TWICE A DAY AS NEEDED FOR PAIN AND INFLAMMATION     docusate sodium 100 MG capsule  Commonly known as:  COLACE  Take 1 capsule (100 mg total) by mouth 2 (two) times daily.     empagliflozin 25 MG Tabs tablet  Commonly known as:  JARDIANCE  Take 25 mg by mouth daily.     glipiZIDE 10 MG tablet  Commonly known as:  GLUCOTROL  TAKE 1 TABLET (10 MG TOTAL) BY MOUTH 2 (TWO) TIMES DAILY BEFORE A MEAL.     Insulin Glargine 300 UNIT/ML Sopn  Commonly known as:  TOUJEO SOLOSTAR  Inject 60 Units into the skin at bedtime.     metFORMIN 500 MG tablet  Commonly known as:  GLUCOPHAGE  TAKE 2 TABLETS BY MOUTH TWICE A DAY WITH A MEAL     methocarbamol 500 MG tablet  Commonly known as:  ROBAXIN  Take 1 tablet (500 mg total) by mouth every 6 (six) hours as needed for muscle spasms.     oxyCODONE 5 MG immediate release tablet  Commonly known as:  Oxy IR/ROXICODONE  Take 1-3 tablets (5-15 mg total) by mouth every 3 (three) hours as needed for breakthrough pain.     rivaroxaban 10 MG Tabs tablet  Commonly known as:  XARELTO  Take 1 tablet (10 mg  total) by mouth daily with breakfast.        Diagnostic Studies: Dg Knee Right Port  05/01/2015   CLINICAL DATA:  Status post right knee replacement  EXAM: PORTABLE RIGHT KNEE - 1-2 VIEW  COMPARISON:  None.  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 Air is noted within the surgical bed. No acute bony or soft tissue abnormality is noted.  IMPRESSION: Postsurgical changes without acute abnormality.   Electronically Signed   By: Alcide Clever M.D.   On: 05/01/2015 10:24    Disposition: to skilled nursing facility      Discharge Instructions    Discharge patient    Complete by:  As directed            Follow-up Information    Follow up with Kathryne Hitch, MD In 2 weeks.   Specialty:  Orthopedic Surgery   Contact information:   [address] [place] [address] [phone]        Signed: Kathryne Hitch 05/05/2015, 7:21 AM

## 2015-05-07 ENCOUNTER — Non-Acute Institutional Stay (SKILLED_NURSING_FACILITY): Payer: BLUE CROSS/BLUE SHIELD | Admitting: Internal Medicine

## 2015-05-07 DIAGNOSIS — I10 Essential (primary) hypertension: Secondary | ICD-10-CM

## 2015-05-07 DIAGNOSIS — E1165 Type 2 diabetes mellitus with hyperglycemia: Secondary | ICD-10-CM

## 2015-05-07 DIAGNOSIS — K5900 Constipation, unspecified: Secondary | ICD-10-CM

## 2015-05-07 DIAGNOSIS — M1711 Unilateral primary osteoarthritis, right knee: Secondary | ICD-10-CM

## 2015-05-07 DIAGNOSIS — IMO0002 Reserved for concepts with insufficient information to code with codable children: Secondary | ICD-10-CM

## 2015-05-07 NOTE — Progress Notes (Signed)
Patient ID: Christian [name], male   DOB: [birth year], 64 y.o.   MRN: [id number]     Facility: [facility]    PCP: Hannah Beat, MD  Code Status: full code  Allergies  Allergen Reactions  . Penicillins     REACTION: swelling, rash    Chief Complaint  Patient presents with  . New Admit To SNF     HPI:  64 year old patient is here for short term rehabilitation post hospital admission with right knee OA. He underwent right total knee arthroplasty. He is seen in his room today. His pain is under control with current regimen. He denies any concerns  Review of Systems:  Constitutional: Negative for fever, chills, diaphoresis.  HENT: Negative for headache, congestion, nasal discharge Eyes: Negative for eye pain, blurred vision, double vision and discharge.  Respiratory: Negative for cough, shortness of breath and wheezing.   Cardiovascular: Negative for chest pain, palpitations, leg swelling.  Gastrointestinal: Negative for heartburn, nausea, vomiting, abdominal pain. Had bowel movement yesterday Genitourinary: Negative for dysuria.  Musculoskeletal: Negative for back pain, falls Skin: Negative for itching and rash.  Neurological: Negative for dizziness, tingling, focal weakness Psychiatric/Behavioral: Negative for depression.    Past Medical History  Diagnosis Date  . Hypertension   . Seizures   . Type 1 diabetes mellitus, uncontrolled   . Gout 09/13/2013  . Chronic pain syndrome 09/13/2013  . Osteoarthritis 09/13/2013  . Chronic back pain 09/13/2013  . Morbid obesity with body mass index of 45.0-49.9 in adult 09/13/2013  . Sleep apnea     has never used C-pap machine due to insurance cost  . Pneumonia     hx. of  . GERD (gastroesophageal reflux disease)     hx. of  . Hemorrhoids    Past Surgical History  Procedure Laterality Date  . Wisdom tooth extraction    . Broken toe 35 years ago    . Total knee arthroplasty Right 05/01/2015   Procedure: RIGHT TOTAL KNEE ARTHROPLASTY;  Surgeon: Kathryne Hitch, MD;  Location: [facility];  Service: Orthopedics;  Laterality: Right;   Social History:   reports that he quit smoking about 11 years ago. He has never used smokeless tobacco. He reports that he does not drink alcohol or use illicit drugs.  Family History  Problem Relation Age of Onset  . Alcohol abuse Father   . Diabetes Maternal Aunt   . Arthritis Maternal Grandmother   . Alcohol abuse Paternal Grandmother   . Alcohol abuse Paternal Grandfather     Medications: Patient's Medications  New Prescriptions   No medications on file  Previous Medications   BISOPROLOL-HYDROCHLOROTHIAZIDE (ZIAC) 10-6.25 MG PER TABLET    TAKE 1 TABLET EVERY MORNING (NEEDS OFFICE VISIT IN MAY)   DICLOFENAC-MISOPROSTOL 75-0.2 MG TBEC    TAKE 1 TABLET BY MOUTH TWICE A DAY AS NEEDED FOR PAIN AND INFLAMMATION   DOCUSATE SODIUM (COLACE) 100 MG CAPSULE    Take 1 capsule (100 mg total) by mouth 2 (two) times daily.   EMPAGLIFLOZIN (JARDIANCE) 25 MG TABS TABLET    Take 25 mg by mouth daily.   GLIPIZIDE (GLUCOTROL) 10 MG TABLET    TAKE 1 TABLET (10 MG TOTAL) BY MOUTH 2 (TWO) TIMES DAILY BEFORE A MEAL.   INSULIN GLARGINE (TOUJEO SOLOSTAR) 300 UNIT/ML SOPN    Inject 60 Units into the skin at bedtime.   METFORMIN (GLUCOPHAGE) 500 MG TABLET    TAKE 2 TABLETS BY MOUTH TWICE  A DAY WITH A MEAL   METHOCARBAMOL (ROBAXIN) 500 MG TABLET    Take 1 tablet (500 mg total) by mouth every 6 (six) hours as needed for muscle spasms.   OXYCODONE (OXY IR/ROXICODONE) 5 MG IMMEDIATE RELEASE TABLET    Take 1-3 tablets (5-15 mg total) by mouth every 3 (three) hours as needed for breakthrough pain.   RIVAROXABAN (XARELTO) 10 MG TABS TABLET    Take 1 tablet (10 mg total) by mouth daily with breakfast.  Modified Medications   No medications on file  Discontinued Medications   No medications on file     Physical Exam: Filed Vitals:   05/07/15 1825  BP: 128/77  Pulse:  76  Temp: 98.5 F (36.9 C)  Resp: 18  SpO2: 99%    General- elderly male, obese, in no acute distress Head- normocephalic, atraumatic Throat- moist mucus membrane Eyes- PERRLA, EOMI, no pallor, no icterus, no discharge, normal conjunctiva, normal sclera Neck- no cervical lymphadenopathy, no jugular vein distension Cardiovascular- normal s1,s2, no murmurs, palpable dorsalis pedis, trace right leg edema Respiratory- bilateral clear to auscultation, no wheeze, no rhonchi, no crackles, no use of accessory muscles Abdomen- bowel sounds present, soft, non tender Musculoskeletal- able to move all 4 extremities, using walker Neurological- no focal deficit Skin- warm and dry, right knee has mepilex dressing Psychiatry- alert and oriented to person, place and time, normal mood and affect    Labs reviewed: Basic Metabolic Panel:  Recent Labs  40/98/1108/06/21 0820 04/27/15 1035 05/02/15 0445  NA 138 141 134*  K 4.1 4.9 4.1  CL 104 106 102  CO2 26 26 24   GLUCOSE 134* 100* 195*  BUN 18 19 16   CREATININE 0.9 0.84 0.81  CALCIUM 9.2 9.4 8.4*   Liver Function Tests: No results for input(s): AST, ALT, ALKPHOS, BILITOT, PROT, ALBUMIN in the last 8760 hours. No results for input(s): LIPASE, AMYLASE in the last 8760 hours. No results for input(s): AMMONIA in the last 8760 hours. CBC:  Recent Labs  05/03/15 0449 05/04/15 0515 05/05/15 0511  WBC 16.5* 18.0* 16.8*  HGB 12.4* 12.5* 11.6*  HCT 38.8* 38.1* 37.0*  MCV 98.2 96.2 97.6  PLT 225 218 231   Cardiac Enzymes: No results for input(s): CKTOTAL, CKMB, CKMBINDEX, TROPONINI in the last 8760 hours. BNP: Invalid input(s): POCBNP CBG:  Recent Labs  05/04/15 2130 05/05/15 0748 05/05/15 1203  GLUCAP 108* 134* 161*     Assessment/Plan  Right knee OA S/p right total knee arthroplasty. Continue oxyIR 5 mg 1-2 tablet q3h prn pain with robaxin 500 mg q6h prn muscle spasm. D/c diclofenac for now. Continue xarelto for dvt prophylaxis.  Has follow up with orthopedics. Will have patient work with PT/OT as tolerated to regain strength and restore function.  Fall precautions are in place.  Dm type 2 Monitor cbg, change cbg check to bid. Continue jardiance 25 mg daily, glipizide 10 mg bid, metformin 1000 mg bid and toujeo 60 u at bedtime.   HTN bp controlled. Continue bisoprolol-hctz 10-6.25 daily, monitor bp  Constipation Stable, continue colace 100 mg bid   Goals of care: short term rehabilitation   Family/ staff Communication: reviewed care plan with patient and nursing supervisor    Oneal GroutMAHIMA Bralyn Folkert, MD  [facility] [phone] (Monday-Friday 8 am - 5 pm) [phone] (afterhours)

## 2015-05-13 ENCOUNTER — Non-Acute Institutional Stay (SKILLED_NURSING_FACILITY): Payer: Managed Care, Other (non HMO) | Admitting: Nurse Practitioner

## 2015-05-13 ENCOUNTER — Encounter: Payer: Self-pay | Admitting: Nurse Practitioner

## 2015-05-13 DIAGNOSIS — M1711 Unilateral primary osteoarthritis, right knee: Secondary | ICD-10-CM | POA: Diagnosis not present

## 2015-05-13 DIAGNOSIS — I10 Essential (primary) hypertension: Secondary | ICD-10-CM | POA: Diagnosis not present

## 2015-05-13 DIAGNOSIS — IMO0002 Reserved for concepts with insufficient information to code with codable children: Secondary | ICD-10-CM

## 2015-05-13 DIAGNOSIS — E1165 Type 2 diabetes mellitus with hyperglycemia: Secondary | ICD-10-CM | POA: Diagnosis not present

## 2015-05-13 DIAGNOSIS — K5900 Constipation, unspecified: Secondary | ICD-10-CM | POA: Diagnosis not present

## 2015-05-13 DIAGNOSIS — R5381 Other malaise: Secondary | ICD-10-CM | POA: Diagnosis not present

## 2015-05-13 NOTE — Progress Notes (Signed)
[facility].  [facility]  Patient reports he has not received his wheelchair.  EDCM called [facility] and spoke to [place] who reports they will contact the patient this evening and will deliver wheelchair tomorrow.  Patient made aware.  Patient thankful for services.  No further EDCM needs at this time.

## 2015-05-13 NOTE — Progress Notes (Signed)
Patient ID: Christian [name], male   DOB: 10/29/1951, 64 y.o.   MRN: [id number]    Nursing Home Location:  [facility]   Place of Service: SNF (31)  PCP: Hannah BeatSpencer Copland, MD  Allergies  Allergen Reactions  . Penicillins     REACTION: swelling, rash    Chief Complaint  Patient presents with  . Discharge Note    Discharge from SNF    HPI:  Patient is a 64 y.o. male seen today at [facility] for discharge home. Pt with a pmh of DM, OA, HTN, OA. here for short term rehabilitation post hospital admission with right knee OA. He underwent right total knee arthroplasty. He is seen in his room today. His pain is under control with current pain medication. Patient currently doing well with therapy, now stable to discharge home with home health.  Review of Systems:  Review of Systems  Constitutional: Negative for activity change, appetite change, fatigue and unexpected weight change.  HENT: Negative for congestion and hearing loss.   Eyes: Negative.   Respiratory: Negative for cough and shortness of breath.   Cardiovascular: Negative for chest pain, palpitations and leg swelling.  Gastrointestinal: Negative for abdominal pain, diarrhea and constipation.  Genitourinary: Negative for dysuria and difficulty urinating.  Musculoskeletal: Positive for arthralgias (controlled with pain medications). Negative for myalgias.  Skin: Negative for color change and wound.  Neurological: Negative for dizziness and weakness.  Psychiatric/Behavioral: Negative for behavioral problems, confusion and agitation.    Past Medical History  Diagnosis Date  . Hypertension   . Seizures   . Type 1 diabetes mellitus, uncontrolled   . Gout 09/13/2013  . Chronic pain syndrome 09/13/2013  . Osteoarthritis 09/13/2013  . Chronic back pain 09/13/2013  . Morbid obesity with body mass index of 45.0-49.9 in adult 09/13/2013  . Sleep apnea     has never used C-pap machine due to  insurance cost  . Pneumonia     hx. of  . GERD (gastroesophageal reflux disease)     hx. of  . Hemorrhoids    Past Surgical History  Procedure Laterality Date  . Wisdom tooth extraction    . Broken toe 35 years ago    . Total knee arthroplasty Right 05/01/2015    Procedure: RIGHT TOTAL KNEE ARTHROPLASTY;  Surgeon: Kathryne Hitchhristopher Y Blackman, MD;  Location: [facility];  Service: Orthopedics;  Laterality: Right;   Social History:   reports that he quit smoking about 11 years ago. He has never used smokeless tobacco. He reports that he does not drink alcohol or use illicit drugs.  Family History  Problem Relation Age of Onset  . Alcohol abuse Father   . Diabetes Maternal Aunt   . Arthritis Maternal Grandmother   . Alcohol abuse Paternal Grandmother   . Alcohol abuse Paternal Grandfather     Medications: Patient's Medications  New Prescriptions   No medications on file  Previous Medications   BISOPROLOL-HYDROCHLOROTHIAZIDE (ZIAC) 10-6.25 MG PER TABLET    TAKE 1 TABLET EVERY MORNING (NEEDS OFFICE VISIT IN MAY)   DOCUSATE SODIUM (COLACE) 100 MG CAPSULE    Take 1 capsule (100 mg total) by mouth 2 (two) times daily.   EMPAGLIFLOZIN (JARDIANCE) 25 MG TABS TABLET    Take 25 mg by mouth daily.   GLIPIZIDE (GLUCOTROL) 10 MG TABLET    TAKE 1 TABLET (10 MG TOTAL) BY MOUTH 2 (TWO) TIMES DAILY BEFORE A MEAL.   INSULIN GLARGINE (TOUJEO  SOLOSTAR) 300 UNIT/ML SOPN    Inject 60 Units into the skin at bedtime.   METFORMIN (GLUCOPHAGE) 500 MG TABLET    TAKE 2 TABLETS BY MOUTH TWICE A DAY WITH A MEAL   METHOCARBAMOL (ROBAXIN) 500 MG TABLET    Take 1 tablet (500 mg total) by mouth every 6 (six) hours as needed for muscle spasms.   OXYCODONE (OXY IR/ROXICODONE) 5 MG IMMEDIATE RELEASE TABLET    Take 1-3 tablets (5-15 mg total) by mouth every 3 (three) hours as needed for breakthrough pain.   RIVAROXABAN (XARELTO) 10 MG TABS TABLET    Take 1 tablet (10 mg total) by mouth daily with breakfast.  Modified  Medications   No medications on file  Discontinued Medications   DICLOFENAC-MISOPROSTOL 75-0.2 MG TBEC    TAKE 1 TABLET BY MOUTH TWICE A DAY AS NEEDED FOR PAIN AND INFLAMMATION     Physical Exam: Filed Vitals:   05/13/15 1058  BP: 135/89  Pulse: 69  Temp: 97.3 F (36.3 C)  TempSrc: Oral  Resp: 16    Physical Exam  Constitutional: He is oriented to person, place, and time. He appears well-developed and well-nourished. No distress.  HENT:  Head: Normocephalic and atraumatic.  Mouth/Throat: Oropharynx is clear and moist. No oropharyngeal exudate.  Eyes: Conjunctivae and EOM are normal. Pupils are equal, round, and reactive to light.  Neck: Normal range of motion. Neck supple.  Cardiovascular: Normal rate, regular rhythm and normal heart sounds.   Pulmonary/Chest: Effort normal and breath sounds normal.  Abdominal: Soft. Bowel sounds are normal.  Musculoskeletal: He exhibits no edema or tenderness.  Neurological: He is alert and oriented to person, place, and time.  Skin: Skin is warm and dry. He is not diaphoretic.  Right knee with meplix dressing, surrounding skin normal, dressing CDI  Psychiatric: He has a normal mood and affect.    Labs reviewed: Basic Metabolic Panel:  Recent Labs  40/98/11 0820 04/27/15 1035 05/02/15 0445  NA 138 141 134*  K 4.1 4.9 4.1  CL 104 106 102  CO2 26 26 24   GLUCOSE 134* 100* 195*  BUN 18 19 16   CREATININE 0.9 0.84 0.81  CALCIUM 9.2 9.4 8.4*   Liver Function Tests: No results for input(s): AST, ALT, ALKPHOS, BILITOT, PROT, ALBUMIN in the last 8760 hours. No results for input(s): LIPASE, AMYLASE in the last 8760 hours. No results for input(s): AMMONIA in the last 8760 hours. CBC:  Recent Labs  05/03/15 0449 05/04/15 0515 05/05/15 0511  WBC 16.5* 18.0* 16.8*  HGB 12.4* 12.5* 11.6*  HCT 38.8* 38.1* 37.0*  MCV 98.2 96.2 97.6  PLT 225 218 231   TSH: No results for input(s): TSH in the last 8760 hours. A1C: Lab Results    Component Value Date   HGBA1C 7.4* 02/16/2015   Lipid Panel:  Recent Labs  09/15/14 1127  CHOL 152  HDL 28.70*  LDLCALC 97  TRIG 131.0  CHOLHDL 5     Assessment/Plan 1. Primary osteoarthritis of right knee -S/p right total knee arthroplasty. Doing well with therapy -pain controlled on current regimen, will dc home and to continue oxy IR 5 mg 1-2 tablet q3h prn pain with robaxin 500 mg q6h prn muscle spasm.  Continue xarelto for dvt prophylaxis.  to keep follow up with orthopedics.  2. Type 2 diabetes mellitus, uncontrolled Stable, to continue jardiance, glipizide and metformin    3. Essential hypertension Stable, cont bisoprolol-hctz   4. Constipation Stable at this time, to  cont colace 100 mg BID, discussed adding miralax on discharge if needed   5. Physical deconditioning -improved, pt is stable for discharge-will need PT/OTper home health. No DME needed. Rx written.  will need to follow up with ortho and PCP within 2 weeks.   Janene Harvey. Biagio Borg  [facility] [phone] 8 am - 5 pm) [phone] (after hours)

## 2015-05-20 ENCOUNTER — Ambulatory Visit: Payer: BLUE CROSS/BLUE SHIELD | Admitting: Internal Medicine

## 2015-05-26 ENCOUNTER — Ambulatory Visit: Payer: Managed Care, Other (non HMO) | Attending: Family Medicine | Admitting: Physical Therapy

## 2015-05-26 DIAGNOSIS — M25661 Stiffness of right knee, not elsewhere classified: Secondary | ICD-10-CM

## 2015-05-26 DIAGNOSIS — R29898 Other symptoms and signs involving the musculoskeletal system: Secondary | ICD-10-CM | POA: Insufficient documentation

## 2015-05-26 DIAGNOSIS — M25561 Pain in right knee: Secondary | ICD-10-CM | POA: Diagnosis not present

## 2015-05-26 DIAGNOSIS — R269 Unspecified abnormalities of gait and mobility: Secondary | ICD-10-CM | POA: Insufficient documentation

## 2015-05-26 NOTE — Patient Instructions (Signed)
   Teaghan Melrose PT, DPT, LAT, ATC  [facility] Phone: [phone]     

## 2015-05-26 NOTE — Therapy (Signed)
[facility] [address] [place], [place], [address] Phone: [phone]   Fax:  [phone]  Physical Therapy Evaluation  Patient Details  Name: Christian [name] MRN: [id number] Date of Birth: June 27, 1951 Referring Provider:  Hannah Beat, MD  Encounter Date: 05/26/2015      PT End of Session - 05/26/15 1013    Visit Number 1   Number of Visits 12   Date for PT Re-Evaluation 07/07/15   PT Start Time 0800   PT Stop Time 0845   PT Time Calculation (min) 45 min   Activity Tolerance Patient tolerated treatment well   Behavior During Therapy [facility] for tasks assessed/performed      Past Medical History  Diagnosis Date  . Hypertension   . Seizures   . Type 1 diabetes mellitus, uncontrolled   . Gout 09/13/2013  . Chronic pain syndrome 09/13/2013  . Osteoarthritis 09/13/2013  . Chronic back pain 09/13/2013  . Morbid obesity with body mass index of 45.0-49.9 in adult 09/13/2013  . Sleep apnea     has never used C-pap machine due to insurance cost  . Pneumonia     hx. of  . GERD (gastroesophageal reflux disease)     hx. of  . Hemorrhoids     Past Surgical History  Procedure Laterality Date  . Wisdom tooth extraction    . Broken toe 35 years ago    . Total knee arthroplasty Right 05/01/2015    Procedure: RIGHT TOTAL KNEE ARTHROPLASTY;  Surgeon: Kathryne Hitch, MD;  Location: [facility];  Service: Orthopedics;  Laterality: Right;    There were no vitals filed for this visit.  Visit Diagnosis:  Right knee pain - Plan: PT plan of care cert/re-cert  Knee stiffness, right - Plan: PT plan of care cert/re-cert  Abnormality of gait - Plan: PT plan of care cert/re-cert  Weakness of right lower extremity - Plan: PT plan of care cert/re-cert      Subjective Assessment - 05/26/15 0810    Subjective pt is a 64 y.o M with CC of R knee pain s/p R TKA on 05/01/2015. since the surgery  pt reports it does keep him up occasionally  but it has been getting progressively better.    How long can you sit comfortably? couple hourse   How long can you stand comfortably? 30 min   How long can you walk comfortably? 30- 45 min   Diagnostic tests 05/16/2015 x-ray per pt report everything looked good.    Patient Stated Goals to get as much use out of the as I had before the surgery.    Currently in Pain? Yes   Pain Score 0-No pain  no pain more stiffness in the morning, took mucle relaxer this AM   Pain Location Knee   Pain Orientation Right   Pain Type Surgical pain   Pain Onset More than a month ago   Pain Frequency Intermittent   Aggravating Factors  bending, and prolong standing   Pain Relieving Factors Ice, muscle relaxers            [facility] PT Assessment - 05/26/15 0818    Assessment   Medical Diagnosis S/P R TKA   Onset Date/Surgical Date 05/01/15   Hand Dominance Right   Next MD Visit 06/16/2015   Prior Therapy yes  for L shoulder   Precautions   Precautions None   Restrictions   Weight Bearing Restrictions No   Home Environment   Living  Environment Private residence   Living Arrangements Spouse/significant other   Available Help at Discharge Available PRN/intermittently;Available 24 hours/day   Type of Home House   Home Access Stairs to enter   Entrance Stairs-Number of Steps 7   Entrance Stairs-Rails Can reach both   Home Layout One level   Prior Function   Level of Independence Independent;Independent with basic ADLs   Vocation Full time employment  network administer for [employer] prolonged sitting, standing   Leisure photography, computers   Cognition   Overall Cognitive Status Within Functional Limits for tasks assessed   Posture/Postural Control   Posture/Postural Control Postural limitations   Postural Limitations Rounded Shoulders;Forward head   ROM / Strength   AROM / PROM / Strength AROM;Strength;PROM   AROM   AROM Assessment Site Knee   Right/Left Knee  Right;Left   Right Knee Extension -5   Right Knee Flexion 104   Left Knee Extension 0   Left Knee Flexion 123   PROM   PROM Assessment Site Knee   Right/Left Knee Right;Left   Left Knee Extension 0  for right knee   Left Knee Flexion 108  for right knee   Strength   Strength Assessment Site Knee   Right/Left Knee Right;Left   Right Knee Flexion 4/5   Right Knee Extension 4/5   Left Knee Flexion 4+/5   Left Knee Extension 5/5   Palpation   Patella mobility mild hypomobility in all planes with the R knee compared to L   Palpation comment tenderness in the mid thigh /quad  reported possibly due to the tourniquet   Ambulation/Gait   Gait Pattern Decreased stance time - right;Decreased step length - left   Static Standing Balance   Static Standing Balance -  Activities  Romberg - Eyes Opened;Tandam Stance - Right Leg;Tandam Stance - Left Leg  mild postural sway with rhomberg, increased sway with tandem   Standardized Balance Assessment   10 Meter Walk .66 m/s  using [facility]                           PT Education - 05/26/15 1013    Education provided Yes   Education Details evaluation findings, POC, goals, HEP   Person(s) Educated Patient   Methods Explanation   Comprehension Verbalized understanding          PT Short Term Goals - 05/26/15 1022    PT SHORT TERM GOAL #1   Title pt will be I with basic HEP (06/16/2015)   Time 3   Period Weeks   Status New   PT SHORT TERM GOAL #2   Title He will be able to verbalize and demonstrate techniques to reduce inflammation and swelling via RICE method (06/16/2015)   Time 3   Period Weeks   Status New           PT Long Term Goals - 05/26/15 1024    PT LONG TERM GOAL #1   Title upon discharge pt will be I with all HEP given throughout therapy (07/07/2015)   Time 6   Period Weeks   Status New   PT LONG TERM GOAL #2   Title He will increase R knee AROM to [facility] compared bil to assist with a functional and  efficient gait pattern (07/07/2015)   Time 6   Period Weeks   Status New   PT LONG TERM GOAL #3   Title  He will increase R knee strength to > 4+/5 to help with walking and standing endurance and safety (07/07/2015)   Time 6   Period Weeks   Status New   PT LONG TERM GOAL #4   Title pt will be able to maintain tandem (or modified tandem) balace for > 20 seconds with mild to no postural sway to increase safety during standing and walking (07/07/2015)   Time 6   Period Weeks   Status New   PT LONG TERM GOAL #5   Title He will increase his 10 MWT to > .90 M/s to help with promoting functional community ambulation (07/07/2015)   Baseline .66 m/s on intial evaluation   Time 6   Period Weeks   Status New               Plan - 05/26/15 1014    Clinical Impression Statement Greggory StallionGeorge presents to OPPT with CC of intermittent R knee pain/stiffness following a R TKA on 05/01/2015. He reports he did 10 days of therapy at a home [facility]. The incision looks clean and appears to be healing well.  He demonstrates limited R knee AROM of -5 to 104 AROM with pain/ tightness noted at endranges. Upon MMT he exhibits mild weakness compared bil. He reports a hx of problems with balacne and demonstrate mild postureal sway during rhomberg balance for 30 sec, but was unable to maintain tandem balance with difficulty getting into positon and holding for 10 seconds wiht mod postural sway. He currently amb with a SPC with antalgic gait pattern with increased stance time and decreased step length on the R  compared bil. He would benefit from skilled physical therapy to maximize his function by addressing the impairments listed.    Pt will benefit from skilled therapeutic intervention in order to improve on the following deficits Abnormal gait;Decreased activity tolerance;Decreased endurance;Decreased balance;Decreased range of motion;Increased edema;Hypomobility;Decreased strength;Postural  dysfunction;Prosthetic Dependency;Pain;Decreased scar mobility;Difficulty walking;Impaired flexibility   Rehab Potential Good   PT Frequency 2x / week   PT Duration 6 weeks   PT Treatment/Interventions ADLs/Self Care Home Management;Cryotherapy;Lawyerlectrical Stimulation;Moist Heat;Ultrasound;Gait training;Therapeutic exercise;Therapeutic activities;Balance training;Neuromuscular re-education;Patient/family education;Manual techniques;Dry needling;Taping;Vasopneumatic Device   PT Next Visit Plan assess response to HEP, knee mobilization, CKC strengthening activities, balance training, modalities PRN   PT Home Exercise Plan see HEP handout   Consulted and Agree with Plan of Care Patient         Problem List Patient Active Problem List   Diagnosis Date Noted  . Osteoarthritis of right knee 05/01/2015  . 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 Benign paroxysmal positional vertigo 12/09/2013  . Eustachian tube dysfunction 12/09/2013  . Gout 09/13/2013  . Chronic pain syndrome 09/13/2013  . Osteoarthritis 09/13/2013  . Chronic back pain 09/13/2013  . Morbid obesity with body mass index of 45.0-49.9 in adult 09/13/2013  . Seizures   . Hypertension   . Type 2 diabetes mellitus, uncontrolled    Lynnel Zanetti PT, DPT, LAT, ATC  05/26/2015  10:35 AM    [facility] [address] [place], [place], [address] Phone: [phone]   Fax:  [phone]

## 2015-06-17 ENCOUNTER — Ambulatory Visit: Payer: Managed Care, Other (non HMO) | Attending: Family Medicine | Admitting: Physical Therapy

## 2015-06-17 DIAGNOSIS — M25561 Pain in right knee: Secondary | ICD-10-CM

## 2015-06-17 DIAGNOSIS — M25661 Stiffness of right knee, not elsewhere classified: Secondary | ICD-10-CM | POA: Diagnosis present

## 2015-06-17 DIAGNOSIS — R269 Unspecified abnormalities of gait and mobility: Secondary | ICD-10-CM

## 2015-06-17 DIAGNOSIS — R29898 Other symptoms and signs involving the musculoskeletal system: Secondary | ICD-10-CM | POA: Diagnosis present

## 2015-06-17 NOTE — Therapy (Signed)
[place], [place], [address] Phone: [phone]   Fax:  [phone]  Physical Therapy Treatment  Patient Details  Name: Christian [name] MRN: [id number] Date of Birth: [birth year] Referring Provider:  Owens Loffler, MD  Encounter Date: 06/17/2015      PT End of Session - 06/17/15 1550    Visit Number 2   Number of Visits 12   Date for PT Re-Evaluation 07/07/15   PT Start Time 0345   PT Stop Time 0435   PT Time Calculation (min) 50 min      Past Medical History  Diagnosis Date  . Hypertension   . Seizures   . Type 1 diabetes mellitus, uncontrolled   . Gout 09/13/2013  . Chronic pain syndrome 09/13/2013  . Osteoarthritis 09/13/2013  . Chronic back pain 09/13/2013  . Morbid obesity with body mass index of 45.0-49.9 in adult 09/13/2013  . Sleep apnea     has never used C-pap machine due to insurance cost  . Pneumonia     hx. of  . GERD (gastroesophageal reflux disease)     hx. of  . Hemorrhoids     Past Surgical History  Procedure Laterality Date  . Wisdom tooth extraction    . Broken toe 35 years ago    . Total knee arthroplasty Right 05/01/2015    Procedure: RIGHT TOTAL KNEE ARTHROPLASTY;  Surgeon: Mcarthur Rossetti, MD;  Location: [facility];  Service: Orthopedics;  Laterality: Right;    There were no vitals filed for this visit.  Visit Diagnosis:  Right knee pain  Knee stiffness, right  Abnormality of gait  Weakness of right lower extremity      Subjective Assessment - 06/17/15 1549    Subjective They removed 4 undissolved stitches 6days ago and the scar is giving me trouble, I am having trouble sleeping.    Currently in Pain? No/denies            [facility] PT Assessment - 06/17/15 1552    AROM   Right Knee Flexion 112                     [facility] Adult PT Treatment/Exercise - 06/17/15 1557    Exercises   Exercises Knee/Hip   Knee/Hip Exercises: Aerobic   Stationary Bike Nustep L 3 LE only x 5 minutes   Knee/Hip Exercises: Machines for Strengthening   Total Gym Leg Press 1 plate x 25 bilateral   Knee/Hip Exercises: Standing   Other Standing Knee Exercises sit-stand x 10 from elevated seat (foam pad) no UE support   Knee/Hip Exercises: Seated   Long Arc Quad 10 reps   Long Arc Quad Weight 3 lbs.   Heel Slides AROM;AAROM;10 reps   Heel Slides Limitations 1 set with sheet to pull   Hamstring Curl Right;2 sets;10 reps   Hamstring Limitations green band   Knee/Hip Exercises: Supine   Straight Leg Raises AROM;10 reps   Modalities   Modalities Cryotherapy   Cryotherapy   Number Minutes Cryotherapy 10 Minutes   Cryotherapy Location Knee   Type of Cryotherapy Ice pack                  PT Short Term Goals - 06/17/15 1629    PT SHORT TERM GOAL #1   Title pt will be I with basic HEP (06/16/2015)   Time 3   Period Weeks   Status Achieved   PT SHORT TERM  GOAL #2   Title He will be able to verbalize and demonstrate techniques to reduce inflammation and swelling via RICE method (06/16/2015)   Time 3   Period Weeks   Status Achieved           PT Long Term Goals - 05/26/15 1024    PT LONG TERM GOAL #1   Title upon discharge pt will be I with all HEP given throughout therapy (07/07/2015)   Time 6   Period Weeks   Status New   PT LONG TERM GOAL #2   Title He will increase R knee AROM to [facility] compared bil to assist with a functional and efficient gait pattern (07/07/2015)   Time 6   Period Weeks   Status New   PT LONG TERM GOAL #3   Title He will increase R knee strength to > 4+/5 to help with walking and standing endurance and safety (07/07/2015)   Time 6   Period Weeks   Status New   PT LONG TERM GOAL #4   Title pt will be able to maintain tandem (or modified tandem) balace for > 20 seconds with mild to no postural sway to increase safety during standing and walking (07/07/2015)   Time 6   Period Weeks   Status New   PT LONG  TERM GOAL #5   Title He will increase his 10 MWT to > .90 M/s to help with promoting functional community ambulation (07/07/2015)   Baseline .66 m/s on intial evaluation   Time 6   Period Weeks   Status New               Plan - 06/17/15 1618    Clinical Impression Statement STG# 1,2 MET. Pt presents with recent stitch removal after having continued issues with his scar. He now has increased ROM with flexion to 112 degrres actively. Reviewed pts HEP and instructed him to use pillows to elevate chair at home for Sit-stands. He was able to perform without UE using pillow. Pt given green band for hamstring curls.    PT Next Visit Plan knee mobilization, CKC strengthening activities, balance training, modalities PRN        Problem List Patient Active Problem List   Diagnosis Date Noted  . Osteoarthritis of right knee 05/01/2015  . 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 Benign paroxysmal positional vertigo 12/09/2013  . Eustachian tube dysfunction 12/09/2013  . Gout 09/13/2013  . Chronic pain syndrome 09/13/2013  . Osteoarthritis 09/13/2013  . Chronic back pain 09/13/2013  . Morbid obesity with body mass index of 45.0-49.9 in adult 09/13/2013  . Seizures   . Hypertension   . Type 2 diabetes mellitus, uncontrolled     Dorene Ar, [place] 06/17/2015, 4:32 PM  [facility] [address] [place], [place], [address] Phone: [phone]   Fax:  [phone]

## 2015-06-22 ENCOUNTER — Ambulatory Visit: Payer: Managed Care, Other (non HMO) | Admitting: Physical Therapy

## 2015-06-22 DIAGNOSIS — M25661 Stiffness of right knee, not elsewhere classified: Secondary | ICD-10-CM

## 2015-06-22 DIAGNOSIS — R269 Unspecified abnormalities of gait and mobility: Secondary | ICD-10-CM

## 2015-06-22 DIAGNOSIS — R29898 Other symptoms and signs involving the musculoskeletal system: Secondary | ICD-10-CM

## 2015-06-22 DIAGNOSIS — M25561 Pain in right knee: Secondary | ICD-10-CM | POA: Diagnosis not present

## 2015-06-22 NOTE — Therapy (Signed)
[facility] Outpatient Rehabilitation [facility] [address] [place], [place], [address] Phone: [phone]   Fax:  [phone]  Physical Therapy Treatment  Patient Details  Name: Christian [name] MRN: [id number] Date of Birth: Dec 22, 1950 Referring Provider:  Hannah Beat, MD  Encounter Date: 06/22/2015      PT End of Session - 06/22/15 1637    Visit Number 3   Number of Visits 12   Date for PT Re-Evaluation 07/07/15   PT Start Time 1545   PT Stop Time 1630   PT Time Calculation (min) 45 min   Activity Tolerance Patient tolerated treatment well   Behavior During Therapy [facility] for tasks assessed/performed      Past Medical History  Diagnosis Date  . Hypertension   . Seizures   . Type 1 diabetes mellitus, uncontrolled   . Gout 09/13/2013  . Chronic pain syndrome 09/13/2013  . Osteoarthritis 09/13/2013  . Chronic back pain 09/13/2013  . Morbid obesity with body mass index of 45.0-49.9 in adult 09/13/2013  . Sleep apnea     has never used C-pap machine due to insurance cost  . Pneumonia     hx. of  . GERD (gastroesophageal reflux disease)     hx. of  . Hemorrhoids     Past Surgical History  Procedure Laterality Date  . Wisdom tooth extraction    . Broken toe 35 years ago    . Total knee arthroplasty Right 05/01/2015    Procedure: RIGHT TOTAL KNEE ARTHROPLASTY;  Surgeon: Kathryne Hitch, MD;  Location: [facility];  Service: Orthopedics;  Laterality: Right;    There were no vitals filed for this visit.  Visit Diagnosis:  Right knee pain  Knee stiffness, right  Abnormality of gait  Weakness of right lower extremity      Subjective Assessment - 06/22/15 1557    Subjective "I am feeling more sore today from doing the yard work with his tractor"   Currently in Pain? No/denies   Pain Score 0-No pain   Pain Location Knee   Pain Orientation Right                         [facility] Adult PT Treatment/Exercise - 06/22/15 1559    Knee/Hip Exercises: Stretches   Passive Hamstring Stretch Right;2 reps;30 seconds   Knee/Hip Exercises: Aerobic   Stationary Bike Nustep L 5 LE only x 10 minutes   Knee/Hip Exercises: Machines for Strengthening   Total Gym Leg Press 1 plate x 25 bilateral   Knee/Hip Exercises: Standing   Other Standing Knee Exercises sit-stand x 10 from elevated seat (foam pad) no UE support   Knee/Hip Exercises: Seated   Long Arc Quad AAROM;Strengthening;Right;1 set;15 reps   Long Arc Quad Weight 4 lbs.   Heel Slides AROM;AAROM;10 reps   Heel Slides Limitations 1 set with sheet to pull   Hamstring Curl Right;2 sets;10 reps   Hamstring Limitations green band   Knee/Hip Exercises: Supine   Straight Leg Raises AROM;Strengthening;Right;1 set;10 reps  VC for quad set    Manual Therapy   Manual Therapy Joint mobilization   Joint Mobilization Grade 3 R knee P<>A                  PT Short Term Goals - 06/17/15 1629    PT SHORT TERM GOAL #1   Title pt will be I with basic HEP (06/16/2015)   Time 3   Period Weeks  Status Achieved   PT SHORT TERM GOAL #2   Title He will be able to verbalize and demonstrate techniques to reduce inflammation and swelling via RICE method (06/16/2015)   Time 3   Period Weeks   Status Achieved           PT Long Term Goals - 06/22/15 1639    PT LONG TERM GOAL #1   Title upon discharge pt will be I with all HEP given throughout therapy (07/07/2015)   Time 6   Period Weeks   Status On-going   PT LONG TERM GOAL #2   Title He will increase R knee AROM to [facility] compared bil to assist with a functional and efficient gait pattern (07/07/2015)   Time 6   Period Weeks   Status On-going   PT LONG TERM GOAL #3   Title He will increase R knee strength to > 4+/5 to help with walking and standing endurance and safety (07/07/2015)   Time 6   Period Weeks   Status On-going   PT LONG TERM GOAL #4   Title pt will be able to maintain tandem (or modified tandem) balace for >  20 seconds with mild to no postural sway to increase safety during standing and walking (07/07/2015)   Time 6   Period Weeks   Status On-going   PT LONG TERM GOAL #5   Title He will increase his 10 MWT to > .90 M/s to help with promoting functional community ambulation (07/07/2015)   Baseline .66 m/s on intial evaluation   Time 6   Period Weeks   Status On-going               Plan - 06/22/15 1637    Clinical Impression Statement Luverne present to therapy today with report that he is doing better since the last visit but is a little sore from working on his lawn the other day.  He  was able to perform all exercises well without complaint of pain.  he reported some tenderness during mobilizations but was able to complete the treatment. Plan to progress with treatment as tolerated.    PT Next Visit Plan knee mobilization, CKC strengthening activities, balance training, modalities PRN   Consulted and Agree with Plan of Care Patient        Problem List Patient Active Problem List   Diagnosis Date Noted  . Osteoarthritis of right knee 05/01/2015  . 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 Benign paroxysmal positional vertigo 12/09/2013  . Eustachian tube dysfunction 12/09/2013  . Gout 09/13/2013  . Chronic pain syndrome 09/13/2013  . Osteoarthritis 09/13/2013  . Chronic back pain 09/13/2013  . Morbid obesity with body mass index of 45.0-49.9 in adult 09/13/2013  . Seizures   . Hypertension   . Type 2 diabetes mellitus, uncontrolled    Domanique Huesman PT, DPT, LAT, ATC  06/22/2015  4:41 PM      [facility] Outpatient Rehabilitation [facility] [address] [place], [place], [address] Phone: [phone]   Fax:  [phone]

## 2015-06-25 ENCOUNTER — Ambulatory Visit: Payer: Managed Care, Other (non HMO) | Admitting: Physical Therapy

## 2015-06-25 DIAGNOSIS — M25661 Stiffness of right knee, not elsewhere classified: Secondary | ICD-10-CM

## 2015-06-25 DIAGNOSIS — M25561 Pain in right knee: Secondary | ICD-10-CM

## 2015-06-25 DIAGNOSIS — R29898 Other symptoms and signs involving the musculoskeletal system: Secondary | ICD-10-CM

## 2015-06-25 DIAGNOSIS — R269 Unspecified abnormalities of gait and mobility: Secondary | ICD-10-CM

## 2015-06-25 NOTE — Therapy (Signed)
[facility] Outpatient Rehabilitation [facility] [address] [place], [place], [address] Phone: [phone]   Fax:  [phone]  Physical Therapy Treatment  Patient Details  Name: Christian [name] MRN: [id number] Date of Birth: 1951-11-07 Referring Provider:  Hannah Beat, MD  Encounter Date: 06/25/2015      PT End of Session - 06/25/15 1636    Visit Number 4   Number of Visits 12   Date for PT Re-Evaluation 07/07/15   PT Start Time 1545   PT Stop Time 1630   PT Time Calculation (min) 45 min   Activity Tolerance Patient tolerated treatment well   Behavior During Therapy [facility] for tasks assessed/performed      Past Medical History  Diagnosis Date  . Hypertension   . Seizures   . Type 1 diabetes mellitus, uncontrolled   . Gout 09/13/2013  . Chronic pain syndrome 09/13/2013  . Osteoarthritis 09/13/2013  . Chronic back pain 09/13/2013  . Morbid obesity with body mass index of 45.0-49.9 in adult 09/13/2013  . Sleep apnea     has never used C-pap machine due to insurance cost  . Pneumonia     hx. of  . GERD (gastroesophageal reflux disease)     hx. of  . Hemorrhoids     Past Surgical History  Procedure Laterality Date  . Wisdom tooth extraction    . Broken toe 35 years ago    . Total knee arthroplasty Right 05/01/2015    Procedure: RIGHT TOTAL KNEE ARTHROPLASTY;  Surgeon: Kathryne Hitch, MD;  Location: [facility];  Service: Orthopedics;  Laterality: Right;    There were no vitals filed for this visit.  Visit Diagnosis:  Right knee pain  Knee stiffness, right  Abnormality of gait  Weakness of right lower extremity      Subjective Assessment - 06/25/15 1548    Subjective "I did pretty good since the last visit and haven't had much pain or soreness"   Currently in Pain? Yes   Pain Location Knee   Pain Orientation Right   Pain Descriptors / Indicators Aching   Pain Type Surgical pain   Pain Onset More than a month ago   Pain Frequency  Intermittent   Aggravating Factors  Bending, and prlonged standing            [facility] PT Assessment - 06/25/15 0001    AROM   Right Knee Extension -5   Right Knee Flexion 112                     [facility] Adult PT Treatment/Exercise - 06/25/15 1551    Knee/Hip Exercises: Stretches   Passive Hamstring Stretch Right;2 reps;30 seconds   Knee/Hip Exercises: Aerobic   Stationary Bike Nustep L 6 LE only x 10 minutes   Knee/Hip Exercises: Machines for Strengthening   Total Gym Leg Press 1 plate x 25 bilateral   Knee/Hip Exercises: Standing   Heel Raises Both;2 sets;15 reps   Knee Flexion 1 set;AROM;Right;10 reps   Forward Step Up 1 set;10 reps  4 inch step   Wall Squat --   Other Standing Knee Exercises sit-stand x 10    Other Standing Knee Exercises hip extension/abduction 2 x 10   Knee/Hip Exercises: Seated   Long Arc Quad AAROM;Strengthening;Right;1 set;15 reps   Long Arc Quad Weight 4 lbs.   Heel Slides AROM;AAROM;10 reps   Heel Slides Limitations 1 set with sheet to pull   Sit to Sand 1 set;10  reps   Knee/Hip Exercises: Supine   Short Arc Quad Sets AROM;Strengthening;Right;2 sets;15 reps   Heel Slides 10 reps;AROM;Right   Straight Leg Raises AROM;Strengthening;Right;1 set;10 reps  VC for quad sets   Manual Therapy   Manual Therapy Joint mobilization   Joint Mobilization Grade 3 R knee P<>A, grade 3 patellar mobilization                PT Education - 06/25/15 1636    Education provided Yes   Education Details HEP review   Person(s) Educated Patient   Methods Explanation   Comprehension Verbalized understanding          PT Short Term Goals - 06/25/15 1639    PT SHORT TERM GOAL #1   Title pt will be I with basic HEP (06/16/2015)   Time 3   Period Weeks   Status Achieved   PT SHORT TERM GOAL #2   Title He will be able to verbalize and demonstrate techniques to reduce inflammation and swelling via RICE method (06/16/2015)   Time 3   Period Weeks    Status Achieved           PT Long Term Goals - 06/25/15 1639    PT LONG TERM GOAL #1   Title upon discharge pt will be I with all HEP given throughout therapy (07/07/2015)   Time 6   Period Weeks   Status On-going   PT LONG TERM GOAL #2   Title He will increase R knee AROM to [facility] compared bil to assist with a functional and efficient gait pattern (07/07/2015)   Time 6   Period Weeks   Status On-going   PT LONG TERM GOAL #3   Title He will increase R knee strength to > 4+/5 to help with walking and standing endurance and safety (07/07/2015)   Time 6   Period Weeks   Status On-going   PT LONG TERM GOAL #4   Title pt will be able to maintain tandem (or modified tandem) balace for > 20 seconds with mild to no postural sway to increase safety during standing and walking (07/07/2015)   Time 6   Period Weeks   Status On-going   PT LONG TERM GOAL #5   Title He will increase his 10 MWT to > .90 M/s to help with promoting functional community ambulation (07/07/2015)   Baseline .66 m/s on intial evaluation   Time 6   Period Weeks   Status On-going               Plan - 06/25/15 1636    Clinical Impression Statement Christian [name] reports that he has been alittle sore with some increased swelling in the inferior aspect of his knee and at the bottom of the incision. He was able to work through all exercises without complaint of pain or problems during or following activity. He favors the RLE demonstrating L lateral  lean during sit to stands requiring VC to decrease. Plan to work on gait training and step ups/ stair training.    PT Next Visit Plan knee mobilization, CKC strengthening activities, balance training, modalities PRN, stairs, gait training   PT Home Exercise Plan HEP review   Consulted and Agree with Plan of Care Patient        Problem List Patient Active Problem List   Diagnosis Date Noted  . Osteoarthritis of right knee 05/01/2015  . Status post total right knee  replacement 05/01/2015  . Benign paroxysmal positional vertigo  12/09/2013  . Eustachian tube dysfunction 12/09/2013  . Gout 09/13/2013  . Chronic pain syndrome 09/13/2013  . Osteoarthritis 09/13/2013  . Chronic back pain 09/13/2013  . Morbid obesity with body mass index of 45.0-49.9 in adult 09/13/2013  . Seizures   . Hypertension   . Type 2 diabetes mellitus, uncontrolled    Wilda Wetherell PT, DPT, LAT, ATC  06/25/2015  4:40 PM    [facility] Outpatient Rehabilitation [facility] [address] [place], [place], [address] Phone: [phone]   Fax:  [phone]

## 2015-06-29 ENCOUNTER — Ambulatory Visit: Payer: Managed Care, Other (non HMO)

## 2015-06-29 ENCOUNTER — Ambulatory Visit: Payer: Managed Care, Other (non HMO) | Admitting: Physical Therapy

## 2015-06-29 DIAGNOSIS — M25661 Stiffness of right knee, not elsewhere classified: Secondary | ICD-10-CM

## 2015-06-29 DIAGNOSIS — R269 Unspecified abnormalities of gait and mobility: Secondary | ICD-10-CM

## 2015-06-29 DIAGNOSIS — M25561 Pain in right knee: Secondary | ICD-10-CM | POA: Diagnosis not present

## 2015-06-29 DIAGNOSIS — R29898 Other symptoms and signs involving the musculoskeletal system: Secondary | ICD-10-CM

## 2015-06-29 NOTE — Therapy (Signed)
[facility] Outpatient Rehabilitation [facility] [address] [place], [place], [address] Phone: [phone]   Fax:  [phone]  Physical Therapy Treatment  Patient Details  Name: Christian [name] MRN: [id number] Date of Birth: [birth year] Referring Provider:  Hannah Beat, MD  Encounter Date: 06/29/2015      PT End of Session - 06/29/15 1544    Visit Number 6   Number of Visits 12   Date for PT Re-Evaluation 07/07/15   PT Start Time 0307   PT Stop Time 0347   PT Time Calculation (min) 40 min   Activity Tolerance Patient tolerated treatment well   Behavior During Therapy [facility] for tasks assessed/performed      Past Medical History  Diagnosis Date  . Hypertension   . Seizures   . Type 1 diabetes mellitus, uncontrolled   . Gout 09/13/2013  . Chronic pain syndrome 09/13/2013  . Osteoarthritis 09/13/2013  . Chronic back pain 09/13/2013  . Morbid obesity with body mass index of 45.0-49.9 in adult 09/13/2013  . Sleep apnea     has never used C-pap machine due to insurance cost  . Pneumonia     hx. of  . GERD (gastroesophageal reflux disease)     hx. of  . Hemorrhoids     Past Surgical History  Procedure Laterality Date  . Wisdom tooth extraction    . Broken toe 35 years ago    . Total knee arthroplasty Right 05/01/2015    Procedure: RIGHT TOTAL KNEE ARTHROPLASTY;  Surgeon: Kathryne Hitch, MD;  Location: [facility];  Service: Orthopedics;  Laterality: Right;    There were no vitals filed for this visit.  Visit Diagnosis:  Right knee pain  Knee stiffness, right  Abnormality of gait  Weakness of right lower extremity      Subjective Assessment - 06/29/15 1515    Subjective Feels he is doing better since start of PT.    He reports scar gets tight and hurts.    Currently in Pain? No/denies            [facility] PT Assessment - 06/29/15 1532    AROM   Right Knee Flexion 114  122 post stretching active and passive                       [facility] Adult PT Treatment/Exercise - 06/29/15 1519    Knee/Hip Exercises: Standing   Heel Raises Both;2 sets;15 reps   Knee Flexion AROM;Right;15 reps   Lateral Step Up Hand Hold: 2;Right  2 reps and he reported leg fatigue and not able to continue    Knee/Hip Exercises: Seated   Long Arc Quad Strengthening;Right;1 set;15 reps   Long Arc Quad Weight 5 lbs.  5 sec hold   Sit to Sand 1 set;10 reps   Knee/Hip Exercises: Supine   Straight Leg Raises AROM;Strengthening;Right;1 set;10 reps   Other Supine Knee/Hip Exercises active and passive knee flexion stretching  x 20 reps.      Heel slides for ROM flexion             PT Short Term Goals - 06/25/15 1639    PT SHORT TERM GOAL #1   Title pt will be I with basic HEP (06/16/2015)   Time 3   Period Weeks   Status Achieved   PT SHORT TERM GOAL #2   Title He will be able to verbalize and demonstrate techniques to reduce inflammation and swelling via RICE method (  06/16/2015)   Time 3   Period Weeks   Status Achieved           PT Long Term Goals - 06/25/15 1639    PT LONG TERM GOAL #1   Title upon discharge pt will be I with all HEP given throughout therapy (07/07/2015)   Time 6   Period Weeks   Status On-going   PT LONG TERM GOAL #2   Title He will increase R knee AROM to [facility] compared bil to assist with a functional and efficient gait pattern (07/07/2015)   Time 6   Period Weeks   Status On-going   PT LONG TERM GOAL #3   Title He will increase R knee strength to > 4+/5 to help with walking and standing endurance and safety (07/07/2015)   Time 6   Period Weeks   Status On-going   PT LONG TERM GOAL #4   Title pt will be able to maintain tandem (or modified tandem) balace for > 20 seconds with mild to no postural sway to increase safety during standing and walking (07/07/2015)   Time 6   Period Weeks   Status On-going   PT LONG TERM GOAL #5   Title He will increase his 10 MWT to > .90 M/s to  help with promoting functional community ambulation (07/07/2015)   Baseline .66 m/s on intial evaluation   Time 6   Period Weeks   Status On-going               Plan - 06/29/15 1633    Clinical Impression Statement He was not having pain today and more soreness proximally at thigh He was able to do the exercies without incr pain but he reported LE fatigue and needed to stop execise on feet today. His flexion range appears improved   PT Next Visit Plan knee mobilization, CKC strengthening activities, balance training, modalities PRN, stairs, gait training   Consulted and Agree with Plan of Care Patient        Problem List Patient Active Problem List   Diagnosis Date Noted  . Osteoarthritis of right knee 05/01/2015  . 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 Benign paroxysmal positional vertigo 12/09/2013  . Eustachian tube dysfunction 12/09/2013  . Gout 09/13/2013  . Chronic pain syndrome 09/13/2013  . Osteoarthritis 09/13/2013  . Chronic back pain 09/13/2013  . Morbid obesity with body mass index of 45.0-49.9 in adult 09/13/2013  . Seizures   . Hypertension   . Type 2 diabetes mellitus, uncontrolled     Caprice Red PT 06/29/2015, 4:36 PM  [facility] [address] [place], [place], [address] Phone: [phone]   Fax:  [phone]

## 2015-07-02 ENCOUNTER — Ambulatory Visit: Payer: Managed Care, Other (non HMO) | Admitting: Physical Therapy

## 2015-07-02 DIAGNOSIS — R269 Unspecified abnormalities of gait and mobility: Secondary | ICD-10-CM

## 2015-07-02 DIAGNOSIS — M25561 Pain in right knee: Secondary | ICD-10-CM

## 2015-07-02 DIAGNOSIS — M25661 Stiffness of right knee, not elsewhere classified: Secondary | ICD-10-CM

## 2015-07-02 DIAGNOSIS — R29898 Other symptoms and signs involving the musculoskeletal system: Secondary | ICD-10-CM

## 2015-07-02 NOTE — Therapy (Signed)
[facility] Outpatient Rehabilitation [facility] [address] [place], [place], [address] Phone: [phone]   Fax:  [phone]  Physical Therapy Treatment  Patient Details  Name: Christian [name] MRN: [id number] Date of Birth: 1951/05/11 Referring Provider:  Hannah Beat, MD  Encounter Date: 07/02/2015      PT End of Session - 07/02/15 1751    Visit Number 7   Number of Visits 12   Date for PT Re-Evaluation 07/07/15   PT Start Time 1545   PT Stop Time 1630   PT Time Calculation (min) 45 min   Activity Tolerance Patient tolerated treatment well   Behavior During Therapy [facility] for tasks assessed/performed      Past Medical History  Diagnosis Date  . Hypertension   . Seizures   . Type 1 diabetes mellitus, uncontrolled   . Gout 09/13/2013  . Chronic pain syndrome 09/13/2013  . Osteoarthritis 09/13/2013  . Chronic back pain 09/13/2013  . Morbid obesity with body mass index of 45.0-49.9 in adult 09/13/2013  . Sleep apnea     has never used C-pap machine due to insurance cost  . Pneumonia     hx. of  . GERD (gastroesophageal reflux disease)     hx. of  . Hemorrhoids     Past Surgical History  Procedure Laterality Date  . Wisdom tooth extraction    . Broken toe 35 years ago    . Total knee arthroplasty Right 05/01/2015    Procedure: RIGHT TOTAL KNEE ARTHROPLASTY;  Surgeon: Christian Hitch, MD;  Location: [facility];  Service: Orthopedics;  Laterality: Right;    There were no vitals filed for this visit.  Visit Diagnosis:  Right knee pain  Knee stiffness, right  Abnormality of gait  Weakness of right lower extremity      Subjective Assessment - 07/02/15 1545    Subjective "I am feeling more sore today in both my knees, it feels like a vice squeezing my knee today"  Pt reports he did clean his vehicles the other day and could be sore from that.    Pain Location Knee   Pain Orientation Right   Pain Descriptors / Indicators Aching;Tightness    Pain Type Surgical pain   Pain Onset More than a month ago   Pain Frequency Intermittent   Aggravating Factors  bending, and prolonged standing                         [facility] Adult PT Treatment/Exercise - 07/02/15 1555    Knee/Hip Exercises: Stretches   Passive Hamstring Stretch Right;2 reps;30 seconds   Knee/Hip Exercises: Aerobic   Stationary Bike Nustep L 6 LE only x 10 minutes   Knee/Hip Exercises: Machines for Strengthening   Total Gym Leg Press 1 plate x 25 bilateral   Knee/Hip Exercises: Standing   Heel Raises Both;2 sets;20 reps   Knee Flexion AROM;Right;15 reps   Lateral Step Up Right;1 set;10 reps;Hand Hold: 1;Step Height: 4"   Forward Step Up 1 set;10 reps;Step Height: 4";Hand Hold: 1   Wall Squat 1 set;10 reps   Gait Training 2 x 180 ft at 85BMP using metronome for heel strike at the [name] for reduce limp.   Other Standing Knee Exercises sit-stand x 10    Other Standing Knee Exercises standing hip ext/ abduction 2# 2 x 15   Knee/Hip Exercises: Seated   Long Arc Quad Strengthening;Right;20 reps;Weights;2 sets   [employer] Weight 5 lbs.  PT Education - 07/02/15 1751    Education provided Yes   Education Details updated HEP   Person(s) Educated Patient   Methods Explanation   Comprehension Verbalized understanding          PT Short Term Goals - 06/25/15 1639    PT SHORT TERM GOAL #1   Title pt will be I with basic HEP (06/16/2015)   Time 3   Period Weeks   Status Achieved   PT SHORT TERM GOAL #2   Title He will be able to verbalize and demonstrate techniques to reduce inflammation and swelling via RICE method (06/16/2015)   Time 3   Period Weeks   Status Achieved           PT Long Term Goals - 07/02/15 1755    PT LONG TERM GOAL #1   Title upon discharge pt will be I with all HEP given throughout therapy (07/07/2015)   Time 6   Period Weeks   Status On-going   PT LONG TERM GOAL #2   Title He will increase R  knee AROM to [facility] compared bil to assist with a functional and efficient gait pattern (07/07/2015)   Time 6   Period Weeks   Status On-going   PT LONG TERM GOAL #3   Title He will increase R knee strength to > 4+/5 to help with walking and standing endurance and safety (07/07/2015)   Time 6   Period Weeks   Status On-going   PT LONG TERM GOAL #4   Title pt will be able to maintain tandem (or modified tandem) balace for > 20 seconds with mild to no postural sway to increase safety during standing and walking (07/07/2015)   Time 6   Period Weeks   Status On-going   PT LONG TERM GOAL #5   Title He will increase his 10 MWT to > .90 M/s to help with promoting functional community ambulation (07/07/2015)   Baseline .66 m/s on intial evaluation   Time 6   Period Weeks   Status On-going               Plan - 07/02/15 1751    Clinical Impression Statement [name] reports feeling more sore today with a tight vice pain in the knee today. he was able to complete all exercises today but demonstrated difficulty during step ups leading with the R and states when he goes up/down steps he goes one step at a time, and has been doing that for a long time. with gait training he was able to reduce the amount of limp he exhibited using metoronome at 80BMP but conitinues demonstrate mild to moderate limp. Pt reports that he wants to continue for the next 2 visits and be done with therapy.         Problem List Patient Active Problem List   Diagnosis Date Noted  . Osteoarthritis of right knee 05/01/2015  . 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 Benign paroxysmal positional vertigo 12/09/2013  . Eustachian tube dysfunction 12/09/2013  . Gout 09/13/2013  . Chronic pain syndrome 09/13/2013  . Osteoarthritis 09/13/2013  . Chronic back pain 09/13/2013  . Morbid obesity with body mass index of 45.0-49.9 in adult 09/13/2013  . Seizures   . Hypertension   . Type 2 diabetes mellitus,  uncontrolled    Christian [name] PT, DPT, LAT, ATC  07/02/2015  5:58 PM     [facility] [address]  [place], [place], [address] Phone: [phone]   Fax:  [phone]

## 2015-07-02 NOTE — Patient Instructions (Signed)
   Teneshia Hedeen PT, DPT, LAT, ATC  [facility] Phone: [phone]     

## 2015-07-06 ENCOUNTER — Ambulatory Visit: Payer: Managed Care, Other (non HMO) | Admitting: Physical Therapy

## 2015-07-09 ENCOUNTER — Ambulatory Visit: Payer: Managed Care, Other (non HMO) | Attending: Family Medicine | Admitting: Physical Therapy

## 2015-07-09 DIAGNOSIS — M25561 Pain in right knee: Secondary | ICD-10-CM | POA: Insufficient documentation

## 2015-07-09 DIAGNOSIS — M25661 Stiffness of right knee, not elsewhere classified: Secondary | ICD-10-CM | POA: Diagnosis present

## 2015-07-09 DIAGNOSIS — R269 Unspecified abnormalities of gait and mobility: Secondary | ICD-10-CM | POA: Diagnosis present

## 2015-07-09 DIAGNOSIS — R29898 Other symptoms and signs involving the musculoskeletal system: Secondary | ICD-10-CM | POA: Insufficient documentation

## 2015-07-09 NOTE — Patient Instructions (Signed)
   Franciscojavier Wronski PT, DPT, LAT, ATC  [facility] Phone: [phone]     

## 2015-07-09 NOTE — Therapy (Signed)
[place], [place], [address] Phone: [phone]   Fax:  [phone]  Physical Therapy Treatment  Patient Details  Name: Christian [name] MRN: [id number] Date of Birth: 15-Nov-1950 Referring Provider:  Owens Loffler, MD  Encounter Date: 07/09/2015      PT End of Session - 07/09/15 1615    Visit Number 8   Number of Visits 12   Date for PT Re-Evaluation 07/07/15   PT Start Time 0071   PT Stop Time 1615   PT Time Calculation (min) 30 min   Activity Tolerance Patient tolerated treatment well   Behavior During Therapy [facility] for tasks assessed/performed      Past Medical History  Diagnosis Date  . Hypertension   . Seizures   . Type 1 diabetes mellitus, uncontrolled   . Gout 09/13/2013  . Chronic pain syndrome 09/13/2013  . Osteoarthritis 09/13/2013  . Chronic back pain 09/13/2013  . Morbid obesity with body mass index of 45.0-49.9 in adult 09/13/2013  . Sleep apnea     has never used C-pap machine due to insurance cost  . Pneumonia     hx. of  . GERD (gastroesophageal reflux disease)     hx. of  . Hemorrhoids     Past Surgical History  Procedure Laterality Date  . Wisdom tooth extraction    . Broken toe 35 years ago    . Total knee arthroplasty Right 05/01/2015    Procedure: RIGHT TOTAL KNEE ARTHROPLASTY;  Surgeon: Mcarthur Rossetti, MD;  Location: [facility];  Service: Orthopedics;  Laterality: Right;    There were no vitals filed for this visit.  Visit Diagnosis:  Right knee pain  Knee stiffness, right  Abnormality of gait  Weakness of right lower extremity      Subjective Assessment - 07/09/15 1551    Subjective " I am doing pretty good I haven't had any problems"   Currently in Pain? No/denies   Pain Score 0-No pain   Pain Location Knee   Pain Orientation Right            [facility] PT Assessment - 07/09/15 0001    AROM   Right Knee Extension -2   Right Knee Flexion 120   tightness in the knee at endrange   Strength   Right Knee Flexion 4+/5   Right Knee Extension 4+/5   Left Knee Flexion 5/5   Left Knee Extension 5/5   Standardized Balance Assessment   10 Meter Walk 1 m/s  10 sec                     [facility] Adult PT Treatment/Exercise - 07/09/15 0001    Self-Care   Self-Care Other Self-Care Comments   Other Self-Care Comments  Educated that he needs to continue with strengthening and progress his reps/sets to continue increased his strength and functional mobility.    Knee/Hip Exercises: Stretches   Passive Hamstring Stretch Right;2 reps;30 seconds   Quad Stretch 2 reps;30 seconds   Knee/Hip Exercises: Aerobic   Stationary Bike Nustep L 6 LE only x 10 minutes   Knee/Hip Exercises: Machines for Strengthening   Total Gym Leg Press 1 plate x 25 bilateral   Knee/Hip Exercises: Standing   Heel Raises Both;2 sets;20 reps                PT Education - 07/09/15 1615    Education provided Yes   Education Details HEP Review,  continuing with exercises to continue to make progress   Person(s) Educated Patient   Methods Explanation   Comprehension Verbalized understanding          PT Short Term Goals - 06/25/15 1639    PT SHORT TERM GOAL #1   Title pt will be I with basic HEP (06/16/2015)   Time 3   Period Weeks   Status Achieved   PT SHORT TERM GOAL #2   Title He will be able to verbalize and demonstrate techniques to reduce inflammation and swelling via RICE method (06/16/2015)   Time 3   Period Weeks   Status Achieved           PT Long Term Goals - 07/09/15 1556    PT LONG TERM GOAL #1   Title upon discharge pt will be I with all HEP given throughout therapy (07/07/2015)   Time 6   Period Weeks   Status On-going   PT LONG TERM GOAL #2   Title He will increase R knee AROM to [facility] compared bil to assist with a functional and efficient gait pattern (07/07/2015)   Baseline 120 flexion -2 extension   Time 6   Period  Weeks   Status Achieved   PT LONG TERM GOAL #3   Title He will increase R knee strength to > 4+/5 to help with walking and standing endurance and safety (07/07/2015)   Time 6   Period Weeks   Status Achieved   PT LONG TERM GOAL #4   Title pt will be able to maintain tandem (or modified tandem) balace for > 20 seconds with mild to no postural sway to increase safety during standing and walking (07/07/2015)   Baseline able to stand tandem 30 sec with minimal postural sway.   Time 6   Period Weeks   Status Achieved   PT LONG TERM GOAL #5   Title He will increase his 10 MWT to > .90 M/s to help with promoting functional community ambulation (07/07/2015)   Baseline .66 m/s on intial evaluation,  1.0 M/S   Time 6   Period Weeks   Status Achieved               Plan - 07/09/15 1615    Clinical Impression Statement Christian [name] has made good progress with his R knee AROM to -3 - 120 degrees with only tightness noted at endrange in the anterior aspect of the knee. He has met all goals and currently reports no pain.  He states that he feels he is able to maintain and properly progress his current level of function, and no longer requires physical therapy and will be discharged today.    PT Next Visit Plan D/C    PT Home Exercise Plan HEP review, added hip abduction/ extension, standing hamstring curls   Consulted and Agree with Plan of Care Patient        Problem List Patient Active Problem List   Diagnosis Date Noted  . Osteoarthritis of right knee 05/01/2015  . 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 Benign paroxysmal positional vertigo 12/09/2013  . Eustachian tube dysfunction 12/09/2013  . Gout 09/13/2013  . Chronic pain syndrome 09/13/2013  . Osteoarthritis 09/13/2013  . Chronic back pain 09/13/2013  . Morbid obesity with body mass index of 45.0-49.9 in adult 09/13/2013  . Seizures   . Hypertension   . Type 2 diabetes mellitus, uncontrolled    Kaynan Klonowski  PT, DPT, LAT, ATC  07/09/2015  4:24 PM      [place], [place], [address] Phone: [phone]   Fax:  [phone]         PHYSICAL THERAPY DISCHARGE SUMMARY  Visits from Start of Care: 8  Current functional level related to goals / functional outcomes: Clinical judgement 35% limited   Remaining deficits: Intermittent tightness in the R knee,   Education / Equipment: HEP, theraband for strengthening.   Plan: Patient agrees to discharge.  Patient goals were met. Patient is being discharged due to meeting the stated rehab goals.  ?????    And pt is happy with his current level of function.

## 2015-07-14 ENCOUNTER — Encounter: Payer: BLUE CROSS/BLUE SHIELD | Admitting: Physical Therapy

## 2015-07-16 ENCOUNTER — Encounter: Payer: BLUE CROSS/BLUE SHIELD | Admitting: Physical Therapy

## 2015-07-20 ENCOUNTER — Other Ambulatory Visit: Payer: Self-pay | Admitting: Internal Medicine

## 2015-07-30 ENCOUNTER — Ambulatory Visit (INDEPENDENT_AMBULATORY_CARE_PROVIDER_SITE_OTHER): Payer: Managed Care, Other (non HMO) | Admitting: Internal Medicine

## 2015-07-30 ENCOUNTER — Other Ambulatory Visit: Payer: Self-pay | Admitting: *Deleted

## 2015-07-30 ENCOUNTER — Other Ambulatory Visit: Payer: BLUE CROSS/BLUE SHIELD

## 2015-07-30 ENCOUNTER — Encounter: Payer: Self-pay | Admitting: Internal Medicine

## 2015-07-30 VITALS — BP 118/72 | HR 64 | Temp 97.5°F | Resp 16 | Wt 291.0 lb

## 2015-07-30 DIAGNOSIS — E1165 Type 2 diabetes mellitus with hyperglycemia: Secondary | ICD-10-CM

## 2015-07-30 DIAGNOSIS — IMO0002 Reserved for concepts with insufficient information to code with codable children: Secondary | ICD-10-CM

## 2015-07-30 MED ORDER — EMPAGLIFLOZIN 25 MG PO TABS: 25.0000 mg | ORAL_TABLET | Freq: Every day | ORAL | Status: DC

## 2015-07-30 MED ORDER — INSULIN GLARGINE 300 UNIT/ML ~~LOC~~ SOPN: 60.0000 [IU] | PEN_INJECTOR | Freq: Every day | SUBCUTANEOUS | Status: DC

## 2015-07-30 NOTE — Progress Notes (Signed)
Patient ID: Christian [name], male   DOB: 21-Sep-1951, 64 y.o.   MRN: [id number]  HPI: Christian [name] is a 64 y.o.-year-old male, returning for f/u for DM2, dx 2009, insulin-dependent since 2014, uncontrolled, with complications (Peripheral neuropathy). Last visit 5 mo ago.  He had R TKR in 04/2015. He is out of work now for 14 weeks.  Last hemoglobin A1c was: Lab Results  Component Value Date   HGBA1C 7.4* 02/16/2015   HGBA1C 7.9* 11/05/2014   HGBA1C 8.1* 07/15/2014   Pt was on a regimen of: - Metformin 1000 mg po bid - Lantus pen 78 units qhs (1 week ago increase from 76) - bedtime He has been on Victoza in the past >> not efficient anymore.  Pt is now on: - Toujeo 60 units at bedtime - Metformin 1000 mg 2x a daily - Glipizide XL 10 mg in am >> Glipizide 10 mg bid - he can forget second dose  - Jardiance 25 mg >> 1 yeast inf >> took 1 dose of Diflucan - no recent ones Glipizide and Invokana were started 03/2014. We switched to Jardiance per insurance preference.  Pt checks his sugars 1-2x a day: - am: 72, 94-140s, some 160s >> 93-154 >> 70-125 >> 77-136, 168 >> 100-130s - 2h after b'fast: n/c >> 157 >> n/c - before lunch: n/c >> 190, 203 >> 189-228 >> n/c - 2h after lunch: 150-360 >> <180, 220 x1, 239 x1 >> n/c - before dinner: n/c >> 107, 156 >> n/c - 2h after dinner: n/c - bedtime: 127-198, some 200s >> 97-248 (higher when he forgets Glipizide at dinner) >> 135-145, 160, 220 - nighttime: n/c No lows. Lowest sugar was 90s; he has hypoglycemia awareness at 90.  Highest sugar was 360 >> 239 >> 250 >> 257 >> 264 >> 200s >> 248 >> 220 at night  Meter: OneTouch Ultra Mini  Pt's meals are: - Breakfast: dry cereal + raisins + no milk; misc. Fruit (banana/orange); V8 juice - Lunch: usually eats out: fish + vegetables (coleslaw); lettuce gives him cramps - Dinner: usually frozen meal  - Snacks: 2-3 nabs Commutes every day to [facility].  - no CKD, last BUN/creatinine:  Lab  Results  Component Value Date   BUN 16 05/02/2015   CREATININE 0.81 05/02/2015  Not on ACEI. - last set of lipids: Lab Results  Component Value Date   CHOL 152 09/15/2014   HDL 28.70* 09/15/2014   LDLCALC 97 09/15/2014   TRIG 131.0 09/15/2014   CHOLHDL 5 09/15/2014  Not on statins b/c leg cramps. - last eye exam was in 11/05/2014 - gets the eye exams every year [facility]). No DR.  - + numbness and tingling in his feet.  I reviewed pt's medications, allergies, PMH, social hx, family hx, and changes were documented in the history of present illness. Otherwise, unchanged from my initial visit note.  ROS: Constitutional: no weight gain/loss, no fatigue, no subjective hyperthermia/hypothermia, no nocturia Eyes: no blurry vision, no xerophthalmia ENT: no sore throat, no nodules palpated in throat, no dysphagia/odynophagia, no hoarseness Cardiovascular: no CP/SOB/palpitations/decreased leg swelling Respiratory: no cough/SOB Gastrointestinal: no N/V/D/C Musculoskeletal: no muscle/joint aches Skin: no rashes Neurological: no tremors/numbness/tingling/dizziness  PE: BP 118/72 mmHg  Pulse 64  Temp(Src) 97.5 F (36.4 C) (Oral)  Resp 16  Wt 291 lb (131.997 kg)  SpO2 94% Body mass index is 44.26 kg/(m^2).   Wt Readings from Last 3 Encounters:  07/30/15 291 lb (131.997 kg)  05/01/15  295 lb (133.811 kg)  04/27/15 295 lb (133.811 kg)   Constitutional: obese, in NAD Eyes: PERRLA, EOMI, no exophthalmos ENT: moist mucous membranes, no thyromegaly, no cervical lymphadenopathy Cardiovascular: RRR, No MRG, + B leg pitting edema Respiratory: CTA B Gastrointestinal: abdomen soft, NT, ND, BS+ Musculoskeletal: no deformities exceptpt bilateral flat feet, strength intact in all 4 Skin: moist, warm, no rashes; L knee scar healing Neurological: no tremor with outstretched hands, DTR normal in all 4  ASSESSMENT: 1. DM2, insulin-dependent, uncontrolled, with complications - PN  PLAN:   1. Patient with uncontrolled diabetes, on oral meds + basal insulin, with improved sugars. He still has occasional high sugars at night, but less frequently than before. - We can re-add a GLP1 R agonist if needed in the future; he was on Victoza before Patient Instructions  Please continue: - Metformin 1000 mg 2x a daily  - Glipizide 10 mg 2x a day  - Jardiance 25 mg daily - Lantus 60 units at bedtime   Try to check sugars in the middle of the day.  Please return in 3 month with your sugar log.   - he is up to date with eye exams  - check HbA1c today >> 6.8% (improved!) - refuses flu shot  - Return to clinic in 3 mo with sugar log

## 2015-07-30 NOTE — Patient Instructions (Signed)
Please continue: - Metformin 1000 mg 2x a daily  - Glipizide 10 mg 2x a day - Jardiance 25 mg daily - Lantus 60 units at bedtime   Try to check sugars in the middle of the day.  Please return in 3 month with your sugar log.

## 2015-08-01 ENCOUNTER — Other Ambulatory Visit: Payer: Self-pay | Admitting: Family Medicine

## 2015-08-01 NOTE — Telephone Encounter (Signed)
Please call and schedule CPE with fasting labs prior with Dr. Copland.  

## 2015-08-15 ENCOUNTER — Other Ambulatory Visit: Payer: Self-pay | Admitting: Internal Medicine

## 2015-08-18 ENCOUNTER — Other Ambulatory Visit: Payer: Self-pay | Admitting: Family Medicine

## 2015-08-20 ENCOUNTER — Ambulatory Visit (INDEPENDENT_AMBULATORY_CARE_PROVIDER_SITE_OTHER): Payer: Managed Care, Other (non HMO) | Admitting: Family Medicine

## 2015-08-20 ENCOUNTER — Encounter: Payer: Self-pay | Admitting: Family Medicine

## 2015-08-20 VITALS — BP 104/72 | HR 73 | Temp 98.0°F | Ht 67.0 in | Wt 293.0 lb

## 2015-08-20 DIAGNOSIS — Z79899 Other long term (current) drug therapy: Secondary | ICD-10-CM

## 2015-08-20 DIAGNOSIS — Z125 Encounter for screening for malignant neoplasm of prostate: Secondary | ICD-10-CM | POA: Diagnosis not present

## 2015-08-20 DIAGNOSIS — E1100 Type 2 diabetes mellitus with hyperosmolarity without nonketotic hyperglycemic-hyperosmolar coma (NKHHC): Secondary | ICD-10-CM

## 2015-08-20 DIAGNOSIS — Z794 Long term (current) use of insulin: Secondary | ICD-10-CM | POA: Diagnosis not present

## 2015-08-20 DIAGNOSIS — I10 Essential (primary) hypertension: Secondary | ICD-10-CM

## 2015-08-20 DIAGNOSIS — M159 Polyosteoarthritis, unspecified: Secondary | ICD-10-CM

## 2015-08-20 DIAGNOSIS — M150 Primary generalized (osteo)arthritis: Secondary | ICD-10-CM

## 2015-08-20 DIAGNOSIS — Z6841 Body Mass Index (BMI) 40.0 and over, adult: Secondary | ICD-10-CM

## 2015-08-20 LAB — CBC WITH DIFFERENTIAL/PLATELET
BASOS PCT: 0.7 % (ref 0.0–3.0)
Basophils Absolute: 0.1 10*3/uL (ref 0.0–0.1)
Eosinophils Absolute: 0.4 10*3/uL (ref 0.0–0.7)
Eosinophils Relative: 3.3 % (ref 0.0–5.0)
HCT: 45 % (ref 39.0–52.0)
Hemoglobin: 14.6 g/dL (ref 13.0–17.0)
LYMPHS PCT: 19.1 % (ref 12.0–46.0)
Lymphs Abs: 2.4 10*3/uL (ref 0.7–4.0)
MCHC: 32.4 g/dL (ref 30.0–36.0)
MCV: 95.4 fl (ref 78.0–100.0)
Monocytes Absolute: 0.7 10*3/uL (ref 0.1–1.0)
Monocytes Relative: 5.6 % (ref 3.0–12.0)
Neutro Abs: 9.1 10*3/uL — ABNORMAL HIGH (ref 1.4–7.7)
Neutrophils Relative %: 71.3 % (ref 43.0–77.0)
Platelets: 248 10*3/uL (ref 150.0–400.0)
RBC: 4.72 Mil/uL (ref 4.22–5.81)
RDW: 14.1 % (ref 11.5–15.5)
WBC: 12.8 10*3/uL — AB (ref 4.0–10.5)

## 2015-08-20 LAB — BASIC METABOLIC PANEL
BUN: 14 mg/dL (ref 6–23)
CO2: 28 mEq/L (ref 19–32)
Calcium: 9.4 mg/dL (ref 8.4–10.5)
Chloride: 103 mEq/L (ref 96–112)
Creatinine, Ser: 0.75 mg/dL (ref 0.40–1.50)
GFR: 111.14 mL/min (ref >60.00)
Glucose, Bld: 79 mg/dL (ref 70–99)
POTASSIUM: 4.2 meq/L (ref 3.5–5.1)
Sodium: 140 mEq/L (ref 135–145)

## 2015-08-20 LAB — MICROALBUMIN / CREATININE URINE RATIO
Creatinine,U: 87.6 mg/dL
Microalb Creat Ratio: 0.8 mg/g (ref 0.0–30.0)

## 2015-08-20 LAB — LIPID PANEL
Cholesterol: 152 mg/dL (ref 0–200)
HDL: 36.3 mg/dL — ABNORMAL LOW (ref >39.00)
LDL Cholesterol: 92 mg/dL (ref 0–99)
NonHDL: 115.63
TRIGLYCERIDES: 116 mg/dL (ref 0.0–149.0)
Total CHOL/HDL Ratio: 4
VLDL: 23.2 mg/dL (ref 0.0–40.0)

## 2015-08-20 LAB — HEPATIC FUNCTION PANEL
ALT: 24 U/L (ref 0–53)
AST: 18 U/L (ref 0–37)
Albumin: 4.1 g/dL (ref 3.5–5.2)
Alkaline Phosphatase: 81 U/L (ref 39–117)
Bilirubin, Direct: 0.1 mg/dL (ref 0.0–0.3)
TOTAL PROTEIN: 7.4 g/dL (ref 6.0–8.3)
Total Bilirubin: 0.6 mg/dL (ref 0.2–1.2)

## 2015-08-20 LAB — PSA: PSA: 0.61 ng/mL (ref 0.10–4.00)

## 2015-08-20 NOTE — Progress Notes (Signed)
Dr. Karleen HampshireSpencer T. Ashley Montminy, MD, CAQ Sports Medicine Primary [facility] and Sports Medicine [address] [place], [id number] Phone: [phone] Fax: [phone]  08/20/2015  Patient: Christian [name], MRN: [id number], DOB: 03/20/1951, 64 y.o.  Primary Physician:  Hannah BeatSpencer Marques Ericson, MD  Chief Complaint: Follow-up  Subjective:   Christian [name] is a 64 y.o. very pleasant male patient who presents with the following:  No OV in 18 months.   S/p TKR this year on the right.   Missed appointment in July.   Refuses all vaccines.  [facility]?  Eye exam end of year  [facility]  HTN: Tolerating all medications without side effects Stable and at goal No CP, no sob. No HA.  BP Readings from Last 3 Encounters:  08/20/15 104/72  07/30/15 118/72  05/13/15 135/89    Basic Metabolic Panel:    Component Value Date/Time   NA 140 08/20/2015 1041   K 4.2 08/20/2015 1041   CL 103 08/20/2015 1041   CO2 28 08/20/2015 1041   BUN 14 08/20/2015 1041   CREATININE 0.75 08/20/2015 1041   GLUCOSE 79 08/20/2015 1041   CALCIUM 9.4 08/20/2015 1041   Lipids: Doing well, stable. No meds Panel reviewed with patient.  Lipids:    Component Value Date/Time   CHOL 152 08/20/2015 1041   TRIG 116.0 08/20/2015 1041   HDL 36.30* 08/20/2015 1041   VLDL 23.2 08/20/2015 1041   CHOLHDL 4 08/20/2015 1041    Lab Results  Component Value Date   ALT 24 08/20/2015   AST 18 08/20/2015   ALKPHOS 81 08/20/2015   BILITOT 0.6 08/20/2015     Lab Results  Component Value Date   HGBA1C 7.4* 02/16/2015    Improving - seeing endo now  Past Medical History, Surgical History, Social History, Family History, Problem List, Medications, and Allergies have been reviewed and updated if relevant.  Patient Active Problem List   Diagnosis Date Noted  . Osteoarthritis of right knee 05/01/2015  . Status post total right knee replacement 05/01/2015   . Benign paroxysmal positional vertigo 12/09/2013  . Eustachian tube dysfunction 12/09/2013  . Gout 09/13/2013  . Chronic pain syndrome 09/13/2013  . Osteoarthritis 09/13/2013  . Chronic back pain 09/13/2013  . Morbid obesity with body mass index of 45.0-49.9 in adult [facility](HCC) 09/13/2013  . Seizures (HCC)   . Hypertension   . Type 2 diabetes mellitus, uncontrolled (HCC)     Past Medical History  Diagnosis Date  . Hypertension   . Seizures (HCC)   . Type 1 diabetes mellitus, uncontrolled (HCC)   . Gout 09/13/2013  . Chronic pain syndrome 09/13/2013  . Osteoarthritis 09/13/2013  . Chronic back pain 09/13/2013  . Morbid obesity with body mass index of 45.0-49.9 in adult [facility](HCC) 09/13/2013  . Sleep apnea     has never used C-pap machine due to insurance cost  . Pneumonia     hx. of  . GERD (gastroesophageal reflux disease)     hx. of  . Hemorrhoids     Past Surgical History  Procedure Laterality Date  . Wisdom tooth extraction    . Broken toe 35 years ago    . Total knee arthroplasty Right 05/01/2015    Procedure: RIGHT TOTAL KNEE ARTHROPLASTY;  Surgeon: Kathryne Hitchhristopher Y Blackman, MD;  Location: [facility];  Service: Orthopedics;  Laterality: Right;    Social History   Social History  .  Marital Status: Married    Spouse Name: N/A  . Number of Children: N/A  . Years of Education: N/A   Occupational History  . Not on file.   Social History Main Topics  . Smoking status: Former Smoker    Quit date: 03/08/2004  . Smokeless tobacco: Never Used  . Alcohol Use: No     Comment: rare  . Drug Use: No  . Sexual Activity:    Partners: Female   Other Topics Concern  . Not on file   Social History Narrative   Psychiatric nurse for [employer]   Married    Family History  Problem Relation Age of Onset  . Alcohol abuse Father   . Diabetes Maternal Aunt   . Arthritis Maternal Grandmother   . Alcohol abuse Paternal Grandmother   . Alcohol abuse Paternal Grandfather     Allergies    Allergen Reactions  . Penicillins     REACTION: swelling, rash    Medication list reviewed and updated in full in [facility].   GEN: No acute illnesses, no fevers, chills. GI: No n/v/d, eating normally Pulm: No SOB Interactive and getting along well at home.  Otherwise, ROS is as per the HPI.  Objective:   BP 104/72 mmHg  Pulse 73  Temp(Src) 98 F (36.7 C) (Oral)  Ht  (1.702 m)  Wt 293 lb (132.904 kg)  BMI 45.88 kg/m2  GEN: WDWN, NAD, Non-toxic, A & O x 3 HEENT: Atraumatic, Normocephalic. Neck supple. No masses, No LAD. Ears and Nose: No external deformity. CV: RRR, No M/G/R. No JVD. No thrill. No extra heart sounds. PULM: CTA B, no wheezes, crackles, rhonchi. No retractions. No resp. distress. No accessory muscle use. EXTR: No c/c/e NEURO Normal gait.  PSYCH: Normally interactive. Conversant. Not depressed or anxious appearing.  Calm demeanor.   Laboratory and Imaging Data: Results for orders placed or performed in visit on 08/20/15  Basic metabolic panel  Result Value Ref Range   Sodium 140 135 - 145 mEq/L   Potassium 4.2 3.5 - 5.1 mEq/L   Chloride 103 96 - 112 mEq/L   CO2 28 19 - 32 mEq/L   Glucose, Bld 79 70 - 99 mg/dL   BUN 14 6 - 23 mg/dL   Creatinine, Ser 4.09 0.40 - 1.50 mg/dL   Calcium 9.4 8.4 - 81.1 mg/dL   GFR 914.78 >29.56 mL/min  CBC with Differential/Platelet  Result Value Ref Range   WBC 12.8 (H) 4.0 - 10.5 K/uL   RBC 4.72 4.22 - 5.81 Mil/uL   Hemoglobin 14.6 13.0 - 17.0 g/dL   HCT 21.3 08.6 - 57.8 %   MCV 95.4 78.0 - 100.0 fl   MCHC 32.4 30.0 - 36.0 g/dL   RDW 46.9 62.9 - 52.8 %   Platelets 248.0 150.0 - 400.0 K/uL   Neutrophils Relative % 71.3 43.0 - 77.0 %   Lymphocytes Relative 19.1 12.0 - 46.0 %   Monocytes Relative 5.6 3.0 - 12.0 %   Eosinophils Relative 3.3 0.0 - 5.0 %   Basophils Relative 0.7 0.0 - 3.0 %   Neutro Abs 9.1 (H) 1.4 - 7.7 K/uL   Lymphs Abs 2.4 0.7 - 4.0 K/uL   Monocytes Absolute 0.7 0.1 - 1.0 K/uL    Eosinophils Absolute 0.4 0.0 - 0.7 K/uL   Basophils Absolute 0.1 0.0 - 0.1 K/uL  Hepatic function panel  Result Value Ref Range   Total Bilirubin 0.6 0.2 - 1.2 mg/dL  Bilirubin, Direct 0.1 0.0 - 0.3 mg/dL   Alkaline Phosphatase 81 39 - 117 U/L   AST 18 0 - 37 U/L   ALT 24 0 - 53 U/L   Total Protein 7.4 6.0 - 8.3 g/dL   Albumin 4.1 3.5 - 5.2 g/dL  Lipid panel  Result Value Ref Range   Cholesterol 152 0 - 200 mg/dL   Triglycerides 161.0 0.0 - 149.0 mg/dL   HDL 96.04 (L) >54.09 mg/dL   VLDL 81.1 0.0 - 91.4 mg/dL   LDL Cholesterol 92 0 - 99 mg/dL   Total CHOL/HDL Ratio 4    NonHDL 115.63   PSA  Result Value Ref Range   PSA 0.61 0.10 - 4.00 ng/mL  Microalbumin / creatinine urine ratio  Result Value Ref Range   Microalb, Ur <0.7 0.0 - 1.9 mg/dL   Creatinine,U 78.2 mg/dL   Microalb Creat Ratio 0.8 0.0 - 30.0 mg/g     Assessment and Plan:   Uncontrolled type 2 diabetes mellitus with hyperosmolarity without coma, with long-term current use of insulin (HCC) - Plan: Basic metabolic panel, Lipid panel, Microalbumin / creatinine urine ratio  Encounter for long-term (current) use of medications - Plan: CBC with Differential/Platelet, Hepatic function panel  Screening PSA (prostate specific antigen) - Plan: PSA  Essential hypertension  Morbid obesity with body mass index of 45.0-49.9 in adult [facility])  Primary osteoarthritis involving multiple joints  Overall, doing well Check all labs  Refilled meds if needed BP and lipids ok Work on weight  Follow-up: Return for 4-5 months for [employer] Exam.  New Prescriptions   No medications on file   Modified Medications   No medications on file   Orders Placed This Encounter  Procedures  . Basic metabolic panel  . CBC with Differential/Platelet  . Hepatic function panel  . Lipid panel  . PSA  . Microalbumin / creatinine urine ratio  . Microalbumin / creatinine urine ratio    Signed,  Trilby Way T. Lille Karim,  MD   Patient's Medications  New Prescriptions   No medications on file  Previous Medications   ASPIRIN 81 MG TABLET    Take 81 mg by mouth once.   BISOPROLOL-HYDROCHLOROTHIAZIDE (ZIAC) 10-6.25 MG PER TABLET    Take 1 tablet by mouth daily.   DICLOFENAC (VOLTAREN) 75 MG EC TABLET    Take 75 mg by mouth 2 (two) times daily.   EMPAGLIFLOZIN (JARDIANCE) 25 MG TABS TABLET    Take 25 mg by mouth daily.   GLIPIZIDE (GLUCOTROL) 10 MG TABLET    TAKE 1 TABLET TWICE A DAY BEFORE MEAL   INSULIN GLARGINE (TOUJEO SOLOSTAR) 300 UNIT/ML SOPN    Inject 60 Units into the skin at bedtime.   METFORMIN (GLUCOPHAGE) 500 MG TABLET    TAKE 2 TABLETS BY MOUTH TWICE A DAY WITH A MEAL   NOVOTWIST 32G X 5 MM MISC    USE AS DIRECTED   OXYCODONE-ACETAMINOPHEN (PERCOCET/ROXICET) 5-325 MG TABLET    Take 1 tablet by mouth 2 (two) times daily as needed for severe pain.   Modified Medications   No medications on file  Discontinued Medications   DICLOFENAC-MISOPROSTOL 75-0.2 MG TBEC       DOCUSATE SODIUM (COLACE) 100 MG CAPSULE    Take 1 capsule (100 mg total) by mouth 2 (two) times daily.   METHOCARBAMOL (ROBAXIN) 500 MG TABLET    Take 1 tablet (500 mg total) by mouth every 6 (six) hours as needed for muscle spasms.  OXYCODONE (OXY IR/ROXICODONE) 5 MG IMMEDIATE RELEASE TABLET    Take 1-3 tablets (5-15 mg total) by mouth every 3 (three) hours as needed for breakthrough pain.   RIVAROXABAN (XARELTO) 10 MG TABS TABLET    Take 1 tablet (10 mg total) by mouth daily with breakfast.

## 2015-08-20 NOTE — Progress Notes (Signed)
Pre visit review using our clinic review tool, if applicable. No additional management support is needed unless otherwise documented below in the visit note. 

## 2015-08-21 ENCOUNTER — Telehealth: Payer: Self-pay | Admitting: Family Medicine

## 2015-08-21 NOTE — Telephone Encounter (Signed)
Patient returned Donna's call. °

## 2015-08-21 NOTE — Telephone Encounter (Signed)
[name] notified that Dr. Patsy Lageropland reviewed his last colonoscopy report from 01/15/2010 and it showed no polyps.Marland Kitchen. He recommends next colonoscopy in 10 years (01/2020).  Health Maintenance updated.

## 2015-08-24 ENCOUNTER — Encounter: Payer: Self-pay | Admitting: *Deleted

## 2015-09-15 ENCOUNTER — Telehealth: Payer: Self-pay | Admitting: Internal Medicine

## 2015-09-15 NOTE — Telephone Encounter (Signed)
Please read message below and advise.  

## 2015-09-15 NOTE — Telephone Encounter (Signed)
Yes, we can work with any of the ones below: - Lantus - Levemir - Evaristo Buryresiba - Basaglar (this will come out soon) And, if needed, NPH.

## 2015-09-15 NOTE — Telephone Encounter (Signed)
Patient ask if their is another medication alternative to [facility], he is retiring and his insurance is changing, please asdvise

## 2015-09-15 NOTE — Telephone Encounter (Signed)
Called pt and advised him per Dr Charlean SanfilippoGherghe's message below. Pt will check with his new ins co.

## 2015-09-16 ENCOUNTER — Other Ambulatory Visit: Payer: Self-pay | Admitting: Family Medicine

## 2015-09-16 NOTE — Telephone Encounter (Signed)
Last office visit 08/20/2015.  Not on current medication list.  Refill?

## 2015-09-19 ENCOUNTER — Other Ambulatory Visit: Payer: Self-pay | Admitting: Internal Medicine

## 2015-10-14 LAB — HM DIABETES EYE EXAM

## 2015-10-15 ENCOUNTER — Other Ambulatory Visit: Payer: Self-pay | Admitting: *Deleted

## 2015-10-15 MED ORDER — GLIPIZIDE 10 MG PO TABS: ORAL_TABLET | ORAL | Status: DC

## 2015-10-15 MED ORDER — METFORMIN HCL 500 MG PO TABS: ORAL_TABLET | ORAL | Status: DC

## 2015-10-16 ENCOUNTER — Other Ambulatory Visit: Payer: Self-pay | Admitting: *Deleted

## 2015-10-16 MED ORDER — DICLOFENAC-MISOPROSTOL 75-0.2 MG PO TBEC: DELAYED_RELEASE_TABLET | ORAL | Status: DC

## 2015-10-28 ENCOUNTER — Other Ambulatory Visit: Payer: Self-pay | Admitting: Family Medicine

## 2015-10-29 ENCOUNTER — Other Ambulatory Visit: Payer: Managed Care, Other (non HMO) | Admitting: *Deleted

## 2015-10-29 ENCOUNTER — Other Ambulatory Visit (INDEPENDENT_AMBULATORY_CARE_PROVIDER_SITE_OTHER): Payer: Managed Care, Other (non HMO) | Admitting: *Deleted

## 2015-10-29 ENCOUNTER — Encounter: Payer: Self-pay | Admitting: Internal Medicine

## 2015-10-29 ENCOUNTER — Ambulatory Visit (INDEPENDENT_AMBULATORY_CARE_PROVIDER_SITE_OTHER): Payer: Managed Care, Other (non HMO) | Admitting: Internal Medicine

## 2015-10-29 VITALS — BP 104/62 | HR 67 | Temp 98.0°F | Resp 12 | Wt 300.8 lb

## 2015-10-29 DIAGNOSIS — E1142 Type 2 diabetes mellitus with diabetic polyneuropathy: Secondary | ICD-10-CM | POA: Diagnosis not present

## 2015-10-29 DIAGNOSIS — IMO0002 Reserved for concepts with insufficient information to code with codable children: Secondary | ICD-10-CM

## 2015-10-29 DIAGNOSIS — E1165 Type 2 diabetes mellitus with hyperglycemia: Secondary | ICD-10-CM

## 2015-10-29 DIAGNOSIS — IMO0001 Reserved for inherently not codable concepts without codable children: Secondary | ICD-10-CM

## 2015-10-29 LAB — POCT GLYCOSYLATED HEMOGLOBIN (HGB A1C)
Hemoglobin A1C: 6.8
Hemoglobin A1C: 7.4

## 2015-10-29 MED ORDER — INSULIN GLARGINE 300 UNIT/ML ~~LOC~~ SOPN: 60.0000 [IU] | PEN_INJECTOR | Freq: Every day | SUBCUTANEOUS | Status: DC

## 2015-10-29 MED ORDER — METFORMIN HCL 1000 MG PO TABS: ORAL_TABLET | ORAL | Status: DC

## 2015-10-29 MED ORDER — GLIPIZIDE 10 MG PO TABS: ORAL_TABLET | ORAL | Status: DC

## 2015-10-29 NOTE — Progress Notes (Signed)
Patient ID: Christian BarmanGeorge E [name], male   DOB: 07/12/1951, 64 y.o.   MRN: [id number]  HPI: Christian [name] is a 64 y.o.-year-old male, returning for f/u for DM2, dx 2009, insulin-dependent since 2014, uncontrolled, with complications (Peripheral neuropathy). Last visit 3 mo ago.  Last hemoglobin A1c was: 07/30/2015: 6.8% Lab Results  Component Value Date   HGBA1C 7.4* 02/16/2015   HGBA1C 7.9* 11/05/2014   HGBA1C 8.1* 07/15/2014   Pt was on a regimen of: - Metformin 1000 mg po bid - Lantus pen 78 units qhs (1 week ago increase from 76) - bedtime He has been on Victoza in the past >> not efficient anymore.  Pt is now on: - Toujeo 60 units at bedtime >> will switch to [employer] + [employer] 11/2015 - may need to change - Metformin 1000 mg 2x a daily - Glipizide 10 mg bid - forgets second dose 50% of the time - Jardiance 25 mg >> 1 yeast inf >> took 1 dose of Diflucan - no recent ones Glipizide and Invokana were started 03/2014. We switched to Jardiance per insurance preference.  Pt checks his sugars 1-2x a day: - am: 72, 94-140s, some 160s >> 93-154 >> 70-125 >> 77-136, 168 >> 100-130s >> 130s - 2h after b'fast: n/c >> 157 >> n/c - before lunch: n/c >> 190, 203 >> 189-228 >> n/c - 2h after lunch: 150-360 >> <180, 220 x1, 239 x1 >> n/c - before dinner: n/c >> 107, 156 >> n/c - 2h after dinner: n/c - bedtime: 97-248 (higher when he forgets Glipizide at dinner) >> 135-145, 160, 220 >> 155-190, 200s if forgets glipizide - nighttime: n/c No lows. Lowest sugar was 90s; he has hypoglycemia awareness at 90.  Highest sugar was 360 >> 239 >> 250 >> 257 >> 264 >> 200s >> 248 >> 220 >> 280 at night  Meter: OneTouch Ultra Mini  Pt's meals are: - Breakfast: dry cereal + raisins + no milk; misc. Fruit (banana/orange); V8 juice; OJ lately - Lunch: usually eats out: fish + vegetables (coleslaw); lettuce gives him cramps - Dinner: usually frozen meal  - Snacks: 2-3 nabs [place] [facility] [place] will [place].  - no CKD, last BUN/creatinine:  Lab Results  Component Value Date   BUN 14 08/20/2015   CREATININE 0.75 08/20/2015  Not on ACEI. - last set of lipids: Lab Results  Component Value Date   CHOL 152 08/20/2015   HDL 36.30* 08/20/2015   LDLCALC 92 08/20/2015   TRIG 116.0 08/20/2015   CHOLHDL 4 08/20/2015  Not on statins b/c leg cramps. - last eye exam was in 11/05/2014 - gets the [facility] exams every year [facility] No DR.  - + numbness and tingling in his feet.  He had TKR in 04/2015.  I reviewed pt's medications, allergies, PMH, social hx, family hx, and changes were documented in the history of present illness. Otherwise, unchanged from my initial visit note.  ROS: Constitutional: no weight gain/loss, no fatigue, no subjective hyperthermia/hypothermia, no nocturia Eyes: no blurry vision, no xerophthalmia ENT: no sore throat, no nodules palpated in throat, no dysphagia/odynophagia, no hoarseness Cardiovascular: no CP/SOB/palpitations/decreased leg swelling Respiratory: no cough/SOB Gastrointestinal: no N/V/D/C Musculoskeletal: no muscle/+ joint aches Skin: no rashes Neurological: no tremors/numbness/tingling/dizziness  PE: BP 104/62 mmHg  Pulse 67  Temp(Src) 98 F (36.7 C) (Oral)  Resp 12  Wt 300 lb 12.8 oz (136.442 kg)  SpO2 97% Body mass index is 47.1 kg/(m^2).  Wt Readings from Last 3 Encounters:  10/29/15 300 lb 12.8 oz (136.442 kg)  08/20/15 293 lb (132.904 kg)  07/30/15 291 lb (131.997 kg)   Constitutional: obese, in NAD Eyes: PERRLA, EOMI, no exophthalmos ENT: moist mucous membranes, no thyromegaly, no cervical lymphadenopathy Cardiovascular: RRR, No MRG, + B leg pitting edema Respiratory: CTA B Gastrointestinal: abdomen soft, NT, ND, BS+ Musculoskeletal: no deformities exceptpt bilateral flat feet, strength intact in all 4 Skin: moist, warm, no rashes; Neurological: no tremor with outstretched hands, DTR normal in all  4  ASSESSMENT: 1. DM2, insulin-dependent, uncontrolled, with complications - PN  PLAN:  1. Patient with uncontrolled diabetes, on oral meds + basal insulin, with now slightly worse sugars as he is forgetting Glipizide at night - We can re-add a GLP1 R agonist if needed in the future; he was on Victoza before. For now, will continue current regimen but strongly advise him not to forget Glipizide. He will retire soon >> I hope he can be more focused on his DM care then. Patient Instructions  Please continue: - Metformin 1000 mg 2x a daily  - Glipizide 10 mg 2x a day - do not forget the second dose. - Jardiance 25 mg daily - Lantus 60 units at bedtime   Stop OJ in am.  Try to check sugars in the middle of the day.  Please return in 3 month with your sugar log.   - he is up to date with eye exams  - check HbA1c today >> 7.4% (higher!) - refused flu shot  - Return to clinic in 3 mo with sugar log

## 2015-10-29 NOTE — Patient Instructions (Signed)
Please continue: - Metformin 1000 mg 2x a daily  - Glipizide 10 mg 2x a day - do not forget the second dose. - Jardiance 25 mg daily - Lantus 60 units at bedtime   Stop OJ in am.  Try to check sugars in the middle of the day.  Please return in 3 month with your sugar log.

## 2015-11-19 ENCOUNTER — Telehealth: Payer: Self-pay | Admitting: Internal Medicine

## 2015-11-19 MED ORDER — INSULIN GLARGINE 300 UNIT/ML ~~LOC~~ SOPN: 60.0000 [IU] | PEN_INJECTOR | Freq: Every day | SUBCUTANEOUS | Status: DC

## 2015-11-19 MED ORDER — EMPAGLIFLOZIN 25 MG PO TABS: 25.0000 mg | ORAL_TABLET | Freq: Every day | ORAL | Status: DC

## 2015-11-19 NOTE — Telephone Encounter (Signed)
jardiance and toujeo needs to be called in, he has new insurance, please call [facility] for 90 day supply please

## 2015-12-03 ENCOUNTER — Telehealth: Payer: Self-pay | Admitting: Internal Medicine

## 2015-12-03 MED ORDER — INSULIN PEN NEEDLE 32G X 5 MM MISC: Status: DC

## 2015-12-03 NOTE — Telephone Encounter (Signed)
Pt needs pen needles for the toujeo called into [facility]

## 2015-12-03 NOTE — Telephone Encounter (Signed)
Pen needles sent to pt's pharmacy.  

## 2015-12-16 ENCOUNTER — Other Ambulatory Visit: Payer: Managed Care, Other (non HMO)

## 2015-12-21 ENCOUNTER — Ambulatory Visit (INDEPENDENT_AMBULATORY_CARE_PROVIDER_SITE_OTHER): Payer: Medicare Other | Admitting: Family Medicine

## 2015-12-21 ENCOUNTER — Encounter: Payer: Self-pay | Admitting: Family Medicine

## 2015-12-21 VITALS — BP 120/80 | HR 70 | Temp 97.7°F | Ht 66.5 in | Wt 302.5 lb

## 2015-12-21 DIAGNOSIS — Z0000 Encounter for general adult medical examination without abnormal findings: Secondary | ICD-10-CM

## 2015-12-21 MED ORDER — BISOPROLOL-HYDROCHLOROTHIAZIDE 10-6.25 MG PO TABS: 1.0000 | ORAL_TABLET | Freq: Every day | ORAL | Status: DC

## 2015-12-21 MED ORDER — DICLOFENAC SODIUM 75 MG PO TBEC: 75.0000 mg | DELAYED_RELEASE_TABLET | Freq: Two times a day (BID) | ORAL | Status: DC

## 2015-12-21 MED ORDER — METFORMIN HCL 1000 MG PO TABS: ORAL_TABLET | ORAL | Status: DC

## 2015-12-21 MED ORDER — GLIPIZIDE 10 MG PO TABS: ORAL_TABLET | ORAL | Status: DC

## 2015-12-21 NOTE — Progress Notes (Signed)
Pre visit review using our clinic review tool, if applicable. No additional management support is needed unless otherwise documented below in the visit note. 

## 2015-12-21 NOTE — Progress Notes (Signed)
Dr. Frederico Hamman T. Brealyn Baril, MD, [facility] [place], [address] Phone: [phone] Fax: [phone]  12/21/2015  Patient: Christian [name], MRN: [id number], DOB: Jul 13, 1951, 65 y.o.  Primary Physician:  Owens Loffler, MD   Chief Complaint  Patient presents with  . Annual Exam    Welcome to Medicare   Subjective:   Christian [name] is a 65 y.o. pleasant patient who presents for a medicare wellness examination:  Preventative Health Maintenance Visit:  Health Maintenance Summary Reviewed and updated, unless pt declines services.  Tobacco History Reviewed. Alcohol: No concerns, no excessive use Exercise Habits: Some activity, rec at least 30 mins 5 times a week STD concerns: no risk or activity to increase risk Drug Use: None Encouraged self-testicular check  Health Maintenance  Topic Date Due  . Hepatitis C Screening  01/22/1951  . HIV Screening  10/12/1965  . INFLUENZA VACCINE  07/29/2016 (Originally 06/08/2015)  . ZOSTAVAX  08/22/2016 (Originally 10/12/2010)  . TETANUS/TDAP  12/21/2020 (Originally 10/12/1969)  . PNA vac Low Risk Adult (1 of 2 - PCV13) 12/21/2020 (Originally 10/13/2015)  . HEMOGLOBIN A1C  04/28/2016  . FOOT EXAM  08/19/2016  . URINE MICROALBUMIN  08/19/2016  . OPHTHALMOLOGY EXAM  12/21/2016  . COLONOSCOPY  01/16/2020    Immunization History  Administered Date(s) Administered  . PPD Test 05/06/2015    Patient Active Problem List   Diagnosis Date Noted  . Osteoarthritis of right knee 05/01/2015  . 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 Benign paroxysmal positional vertigo 12/09/2013  . Eustachian tube dysfunction 12/09/2013  . Gout 09/13/2013  . Chronic pain syndrome 09/13/2013  . Osteoarthritis 09/13/2013  . Chronic back pain 09/13/2013  . Morbid obesity with body mass index of 45.0-49.9 in adult [facility]) 09/13/2013  . Seizures ([place])   . Hypertension   . Type 2  diabetes mellitus, uncontrolled ([place])    Past Medical History  Diagnosis Date  . Hypertension   . Seizures ([place])   . Type 1 diabetes mellitus, uncontrolled ([place])   . Gout 09/13/2013  . Chronic pain syndrome 09/13/2013  . Osteoarthritis 09/13/2013  . Chronic back pain 09/13/2013  . Morbid obesity with body mass index of 45.0-49.9 in adult [facility]) 09/13/2013  . Sleep apnea     has never used C-pap machine due to insurance cost  . Pneumonia     hx. of  . GERD (gastroesophageal reflux disease)     hx. of  . Hemorrhoids    Past Surgical History  Procedure Laterality Date  . Wisdom tooth extraction    . Broken toe 35 years ago    . Total knee arthroplasty Right 05/01/2015    Procedure: RIGHT TOTAL KNEE ARTHROPLASTY;  Surgeon: Mcarthur Rossetti, MD;  Location: [facility];  Service: Orthopedics;  Laterality: Right;   Social History   Social History  . Marital Status: Married    Spouse Name: N/A  . Number of Children: N/A  . Years of Education: N/A   Occupational History  . Not on file.   Social History Main Topics  . Smoking status: Former Smoker    Quit date: 03/08/2004  . Smokeless tobacco: Never Used  . Alcohol Use: No     Comment: rare  . Drug Use: No  . Sexual Activity:    Partners: Female   Other Topics Concern  . Not on file   Social History Narrative   Materials engineer for  [employer]   Married   Family History  Problem Relation Age of Onset  . Alcohol abuse Father   . Diabetes Maternal Aunt   . Arthritis Maternal Grandmother   . Alcohol abuse Paternal Grandmother   . Alcohol abuse Paternal Grandfather    Allergies  Allergen Reactions  . Penicillins     REACTION: swelling, rash    Medication list has been reviewed and updated.   General: Denies fever, chills, sweats. No significant weight loss. Eyes: Denies blurring,significant itching ENT: Denies earache, sore throat, and hoarseness. Cardiovascular: Denies chest pains, palpitations, dyspnea on  exertion Respiratory: Denies cough, dyspnea at rest,wheeezing Breast: no concerns about lumps GI: Denies nausea, vomiting, diarrhea, constipation, change in bowel habits, abdominal pain, melena, hematochezia GU: Denies penile discharge, ED, urinary flow / outflow problems. No STD concerns. Musculoskeletal: Denies back pain, joint pain Derm: Denies rash, itching Neuro: Denies  paresthesias, frequent falls, frequent headaches Psych: Denies depression, anxiety Endocrine: Denies cold intolerance, heat intolerance, polydipsia Heme: Denies enlarged lymph nodes Allergy: No hayfever  Objective:   BP 120/80 mmHg  Pulse 70  Temp(Src) 97.7 F (36.5 C) (Oral)  Ht 5' 6.5" (1.689 m)  Wt 302 lb 8 oz (137.213 kg)  BMI 48.10 kg/m2  The patient completed a fall screen and PHQ-2 and PHQ-9 if necessary, which is documented in the EHR. The CMA/LPN/RN who assisted the patient verbally completed with them and documented results in [place].   Hearing Screening   Method: Audiometry   '125Hz'  '250Hz'  '500Hz'  '1000Hz'  '2000Hz'  '4000Hz'  '8000Hz'   Right ear:   25 40 40 0   Left ear:   '20 20 20 ' 0   Vision Screening Comments: Wears Glasses-Eye Exam with Alois Cliche 10/14/2015  GEN: well developed, well nourished, no acute distress Eyes: conjunctiva and lids normal, PERRLA, EOMI ENT: TM clear, nares clear, oral exam WNL Neck: supple, no lymphadenopathy, no thyromegaly, no JVD Pulm: clear to auscultation and percussion, respiratory effort normal CV: regular rate and rhythm, S1-S2, no murmur, rub or gallop, no bruits, peripheral pulses normal and symmetric, no cyanosis, clubbing, edema or varicosities GI: soft, non-tender; no hepatosplenomegaly, masses; active bowel sounds all quadrants GU: defer Lymph: no cervical, axillary or inguinal adenopathy MSK: gait normal, muscle tone and strength WNL, no joint swelling, effusions, discoloration, crepitus  SKIN: clear, good turgor, color WNL, no rashes, lesions, or  ulcerations Neuro: normal mental status, normal strength, sensation, and motion Psych: alert; oriented to person, place and time, normally interactive and not anxious or depressed in appearance.  All labs reviewed with patient.  Lipids:    Component Value Date/Time   CHOL 152 08/20/2015 1041   TRIG 116.0 08/20/2015 1041   HDL 36.30* 08/20/2015 1041   VLDL 23.2 08/20/2015 1041   CHOLHDL 4 08/20/2015 1041   CBC: CBC Latest Ref Rng 08/20/2015 05/05/2015 05/04/2015  WBC 4.0 - 10.5 K/uL 12.8(H) 16.8(H) 18.0(H)  Hemoglobin 13.0 - 17.0 g/dL 14.6 11.6(L) 12.5(L)  Hematocrit 39.0 - 52.0 % 45.0 37.0(L) 38.1(L)  Platelets 150.0 - 400.0 K/uL 248.0 231 614    Basic Metabolic Panel:    Component Value Date/Time   NA 140 08/20/2015 1041   K 4.2 08/20/2015 1041   CL 103 08/20/2015 1041   CO2 28 08/20/2015 1041   BUN 14 08/20/2015 1041   CREATININE 0.75 08/20/2015 1041   GLUCOSE 79 08/20/2015 1041   CALCIUM 9.4 08/20/2015 1041   Hepatic Function Latest Ref Rng 08/20/2015 12/09/2013 09/11/2013  Total Protein  6.0 - 8.3 g/dL 7.4 7.6 8.2  Albumin 3.5 - 5.2 g/dL 4.1 3.7 4.0  AST 0 - 37 U/L 18 27 32  ALT 0 - 53 U/L 24 33 34  Alk Phosphatase 39 - 117 U/L 81 62 65  Total Bilirubin 0.2 - 1.2 mg/dL 0.6 0.8 0.7  Bilirubin, Direct 0.0 - 0.3 mg/dL 0.1 - 0.0    No results found for: TSH Lab Results  Component Value Date   PSA 0.61 08/20/2015    Assessment and Plan:   Healthcare maintenance  Health Maintenance Exam: The patient's preventative maintenance and recommended screening tests for an annual wellness exam were reviewed in full today. Brought up to date unless services declined.  Counselled on the importance of diet, exercise, and its role in overall health and mortality. The patient's FH and SH was reviewed, including their home life, tobacco status, and drug and alcohol status.  I have personally reviewed the Medicare Annual Wellness questionnaire and have noted 1. The patient's  medical and social history 2. Their use of alcohol, tobacco or illicit drugs 3. Their current medications and supplements 4. The patient's functional ability including ADL's, fall risks, home safety risks and hearing or visual             impairment. 5. Diet and physical activities 6. Evidence for depression or mood disorders 7. Reviewed Updated provider list, see scanned forms and [facility] Snapshot.   The patients weight, height, BMI and visual acuity have been recorded in the chart I have made referrals, counseling and provided education to the patient based review of the above and I have provided the pt with a written personalized care plan for preventive services.  I have provided the patient with a copy of your personalized plan for preventive services. Instructed to take the time to review along with their updated medication list.  Patient declines all vaccinations.  The patient declines routine health maintenance services noted. We reviewed that could lead to missing significant problems that could affect there mortality. The patient indicated that they understood this and was willing to accept those risks.  Follow-up: No Follow-up on file. Or follow-up in 1 year for complete physical examination  New Prescriptions   DICLOFENAC (VOLTAREN) 75 MG EC TABLET    Take 1 tablet (75 mg total) by mouth 2 (two) times daily.   Modified Medications   Modified Medication Previous Medication   BISOPROLOL-HYDROCHLOROTHIAZIDE (ZIAC) 10-6.25 MG TABLET bisoprolol-hydrochlorothiazide (ZIAC) 10-6.25 MG tablet      Take 1 tablet by mouth daily.    TAKE 1 TABLET BY MOUTH DAILY.   GLIPIZIDE (GLUCOTROL) 10 MG TABLET glipiZIDE (GLUCOTROL) 10 MG tablet      TAKE 1 TABLET TWICE A DAY BEFORE MEAL    TAKE 1 TABLET TWICE A DAY BEFORE MEAL   METFORMIN (GLUCOPHAGE) 1000 MG TABLET metFORMIN (GLUCOPHAGE) 1000 MG tablet      TAKE 2 TABLETS BY MOUTH TWICE A DAY WITH A MEAL    TAKE 2 TABLETS BY MOUTH TWICE A DAY WITH A  MEAL   No orders of the defined types were placed in this encounter.    Signed,  Maud Deed. Levy Cedano, MD   Patient's Medications  New Prescriptions   DICLOFENAC (VOLTAREN) 75 MG EC TABLET    Take 1 tablet (75 mg total) by mouth 2 (two) times daily.  Previous Medications   ASPIRIN 81 MG TABLET    Take 81 mg by mouth once.   EMPAGLIFLOZIN (JARDIANCE) 25 MG  TABS TABLET    Take 25 mg by mouth daily.   INSULIN GLARGINE (TOUJEO SOLOSTAR) 300 UNIT/ML SOPN    Inject 60 Units into the skin at bedtime.   INSULIN PEN NEEDLE (NOVOTWIST) 32G X 5 MM MISC    USE AS DIRECTED   OXYCODONE-ACETAMINOPHEN (PERCOCET/ROXICET) 5-325 MG TABLET    Take 1 tablet by mouth 2 (two) times daily as needed for severe pain.   Modified Medications   Modified Medication Previous Medication   BISOPROLOL-HYDROCHLOROTHIAZIDE (ZIAC) 10-6.25 MG TABLET bisoprolol-hydrochlorothiazide (ZIAC) 10-6.25 MG tablet      Take 1 tablet by mouth daily.    TAKE 1 TABLET BY MOUTH DAILY.   GLIPIZIDE (GLUCOTROL) 10 MG TABLET glipiZIDE (GLUCOTROL) 10 MG tablet      TAKE 1 TABLET TWICE A DAY BEFORE MEAL    TAKE 1 TABLET TWICE A DAY BEFORE MEAL   METFORMIN (GLUCOPHAGE) 1000 MG TABLET metFORMIN (GLUCOPHAGE) 1000 MG tablet      TAKE 2 TABLETS BY MOUTH TWICE A DAY WITH A MEAL    TAKE 2 TABLETS BY MOUTH TWICE A DAY WITH A MEAL  Discontinued Medications   DICLOFENAC (VOLTAREN) 75 MG EC TABLET    Take 75 mg by mouth 2 (two) times daily.   DICLOFENAC-MISOPROSTOL 75-0.2 MG TBEC    TAKE 1 TABLET BY MOUTH TWICE A DAY AS NEEDED FOR PAIN AND INFLAMMATION

## 2015-12-21 NOTE — Patient Instructions (Signed)
MD for your wife: Dr. Sharen Hones or Dr. Milinda Antis

## 2016-01-13 ENCOUNTER — Telehealth: Payer: Self-pay | Admitting: Internal Medicine

## 2016-01-13 NOTE — Telephone Encounter (Signed)
Pt has a yeast infection please help

## 2016-01-13 NOTE — Telephone Encounter (Signed)
If you need to call in a rx please call it into [facility] in [place]

## 2016-01-14 MED ORDER — FLUCONAZOLE 150 MG PO TABS: 150.0000 mg | ORAL_TABLET | Freq: Once | ORAL | Status: DC

## 2016-01-14 NOTE — Telephone Encounter (Signed)
Please read message below and advise.  

## 2016-01-14 NOTE — Telephone Encounter (Signed)
Diflucan 150mg #1 with 1 refill

## 2016-01-14 NOTE — Addendum Note (Signed)
Addended by: Adline MangoALLICUTT, Keats Kingry B on: 01/14/2016 08:42 AM   Modules accepted: Orders

## 2016-01-27 ENCOUNTER — Ambulatory Visit (INDEPENDENT_AMBULATORY_CARE_PROVIDER_SITE_OTHER): Payer: Medicare Other | Admitting: Internal Medicine

## 2016-01-27 ENCOUNTER — Other Ambulatory Visit (INDEPENDENT_AMBULATORY_CARE_PROVIDER_SITE_OTHER): Payer: Medicare Other | Admitting: *Deleted

## 2016-01-27 ENCOUNTER — Encounter: Payer: Self-pay | Admitting: Internal Medicine

## 2016-01-27 VITALS — BP 114/68 | HR 69 | Temp 97.6°F | Resp 14 | Wt 303.0 lb

## 2016-01-27 DIAGNOSIS — E1142 Type 2 diabetes mellitus with diabetic polyneuropathy: Secondary | ICD-10-CM | POA: Diagnosis not present

## 2016-01-27 DIAGNOSIS — E1165 Type 2 diabetes mellitus with hyperglycemia: Secondary | ICD-10-CM

## 2016-01-27 DIAGNOSIS — Z794 Long term (current) use of insulin: Secondary | ICD-10-CM

## 2016-01-27 DIAGNOSIS — E1100 Type 2 diabetes mellitus with hyperosmolarity without nonketotic hyperglycemic-hyperosmolar coma (NKHHC): Secondary | ICD-10-CM | POA: Diagnosis not present

## 2016-01-27 DIAGNOSIS — IMO0002 Reserved for concepts with insufficient information to code with codable children: Secondary | ICD-10-CM

## 2016-01-27 LAB — POCT GLYCOSYLATED HEMOGLOBIN (HGB A1C): Hemoglobin A1C: 7.6

## 2016-01-27 MED ORDER — INSULIN GLARGINE 300 UNIT/ML ~~LOC~~ SOPN: 50.0000 [IU] | PEN_INJECTOR | Freq: Every day | SUBCUTANEOUS | Status: DC

## 2016-01-27 MED ORDER — INSULIN LISPRO 100 UNIT/ML (KWIKPEN): 10.0000 [IU] | PEN_INJECTOR | Freq: Every day | SUBCUTANEOUS | Status: DC

## 2016-01-27 MED ORDER — GLUCOSE BLOOD VI STRP: ORAL_STRIP | Status: DC

## 2016-01-27 MED ORDER — METFORMIN HCL 1000 MG PO TABS: ORAL_TABLET | ORAL | Status: DC

## 2016-01-27 NOTE — Patient Instructions (Addendum)
Please continue: - Metformin 1000 mg 2x a daily  - Glipizide 10 mg 2x a day - do not forget the second dose. - Jardiance 25 mg daily  Please decrease: - Toujeo to 50 units at bedtime   Please add: - Humalog 10 units 15 min before dinner  Please return in 3 month with your sugar log.

## 2016-01-27 NOTE — Progress Notes (Signed)
Patient ID: Christian BarmanGeorge E [name], male   DOB: 05/24/1951, 65 y.o.   MRN: [id number]  HPI: Christian BarmanGeorge E [name] is a 65 y.o.-year-old male, returning for f/u for DM2, dx 2009, insulin-dependent since 2014, uncontrolled, with complications (Peripheral neuropathy). Last visit 3 mo ago. [name] + [employer] now.  Last hemoglobin A1c was: Lab Results  Component Value Date   HGBA1C 7.4 10/29/2015   HGBA1C 6.8 07/30/2015   HGBA1C 7.4* 02/16/2015   Pt was on a regimen of: - Metformin 1000 mg po bid - Lantus pen 78 units qhs (1 week ago increase from 76) - bedtime He has been on Victoza in the past >> not efficient anymore.  Pt is now on: - Toujeo 60 units at bedtime - Metformin 1000 mg 2x a daily - Glipizide 10 mg bid  - Jardiance 25 mg >> took Diflucan x2 Glipizide and Invokana were started 03/2014. We switched to Jardiance per insurance preference.  Pt checks his sugars 2x a day: - am: 72, 94-140s, some 160s >> 93-154 >> 70-125 >> 77-136, 168 >> 100-130s >> 130s >> 105-130 - 2h after b'fast: n/c >> 157 >> n/c - before lunch: n/c >> 190, 203 >> 189-228 >> n/c - 2h after lunch: 150-360 >> <180, 220 x1, 239 x1 >> n/c - before dinner: n/c >> 107, 156 >> n/c - 2h after dinner: n/c - bedtime: 97-248 >> 135-145, 160, 220 >> 155-190, 200s if forgets glipizide >> 180s, 235 >> 182-260 - nighttime: n/c No lows. Lowest sugar was 90s >> 105; he has hypoglycemia awareness at 90.  Highest sugar was 360 >> 239 >> 250 >> 257 >> 264 >> 200s >> 248 >> 220 >> 280 at night  Meter: OneTouch Ultra Mini  Pt's meals are: - Breakfast: dry cereal + raisins + no milk; misc. Fruit (banana/orange); V8 juice - Lunch: usually eats out: fish + vegetables (coleslaw); lettuce gives him cramps - Dinner: usually frozen meal  - Snacks: 2-3 nabs [place] [facility] [place] [facility] >> will [place] soon.  - no CKD, last BUN/creatinine:  Lab Results  Component Value Date   BUN 14 08/20/2015   CREATININE 0.75 08/20/2015  Not on  ACEI. - last set of lipids: Lab Results  Component Value Date   CHOL 152 08/20/2015   HDL 36.30* 08/20/2015   LDLCALC 92 08/20/2015   TRIG 116.0 08/20/2015   CHOLHDL 4 08/20/2015  Not on statins b/c leg cramps. - last eye exam was in 10/2015 - gets [facility] exams every [facility] No DR.  - + numbness and tingling in his feet.  He had TKR in 04/2015.  I reviewed pt's medications, allergies, PMH, social hx, family hx, and changes were documented in the history of present illness. Otherwise, unchanged from my initial visit note.  ROS: Constitutional: no weight gain/loss, no fatigue, no subjective hyperthermia/hypothermia, no nocturia Eyes: no blurry vision, no xerophthalmia ENT: no sore throat, no nodules palpated in throat, no dysphagia/odynophagia, no hoarseness Cardiovascular: no CP/SOB/palpitations/decreased leg swelling Respiratory: no cough/SOB Gastrointestinal: no N/V/D/C Musculoskeletal: no muscle/+ joint aches Skin: no rashes Neurological: no tremors/numbness/tingling/dizziness  PE: BP 114/68 mmHg  Pulse 69  Temp(Src) 97.6 F (36.4 C) (Oral)  Resp 14  Wt 303 lb (137.44 kg)  SpO2 95% Body mass index is 48.18 kg/(m^2).   Wt Readings from Last 3 Encounters:  01/27/16 303 lb (137.44 kg)  12/21/15 302 lb 8 oz (137.213 kg)  10/29/15 300 lb 12.8 oz (136.442 kg)  Constitutional: obese, in NAD Eyes: PERRLA, EOMI, no exophthalmos ENT: moist mucous membranes, no thyromegaly, no cervical lymphadenopathy Cardiovascular: RRR, No MRG, + B leg pitting edema Respiratory: CTA B Gastrointestinal: abdomen soft, NT, ND, BS+ Musculoskeletal: no deformities exceptpt bilateral flat feet, strength intact in all 4 Skin: moist, warm, no rashes; Neurological: no tremor with outstretched hands, DTR normal in all 4  ASSESSMENT: 1. DM2, insulin-dependent, uncontrolled, with complications - PN  PLAN:  1. Patient with uncontrolled diabetes, on oral meds + basal insulin,  with worse sugars after dinner. Sugars in am are at goal - offered to refer him to nutrition >> will think about it - will add Humalog with dinner and decrease Toujeo: Patient Instructions  Please continue: - Metformin 1000 mg 2x a daily  - Glipizide 10 mg 2x a day - do not forget the second dose. - Jardiance 25 mg daily  Please decrease: - Toujeo to 50 units at bedtime   Please add: - Humalog 10 units 15 min before dinner  Please return in 3 month with your sugar log.   - UTD with eye exams - check HbA1c today >> 7.6% (higher!) - refused flu shot  - Return to clinic in 3 mo with sugar log

## 2016-02-16 ENCOUNTER — Telehealth: Payer: Self-pay | Admitting: Internal Medicine

## 2016-02-16 NOTE — Telephone Encounter (Signed)
We can try to give him coupons for these.  If they don't work, he needs to call insurance and ask if the following are covered:  Instead of toujeo: - Lantus - Basaglar - Levemir - Evaristo Buryresiba  Instead of Jardiance: - Invokana - Farxiga  If not, we'll need to think about something else.

## 2016-02-16 NOTE — Telephone Encounter (Signed)
Please read message below and advise.  

## 2016-02-16 NOTE — Telephone Encounter (Signed)
Patient stated his medication Jardiance and Toujeo is to expensive is there an alternative. Patient also ask to speak to Dr Elvera LennoxGherghe

## 2016-02-17 NOTE — Telephone Encounter (Signed)
Called pt and lvm advising him per Dr Charlean SanfilippoGherghe's message. Advised pt to let us know.

## 2016-03-14 ENCOUNTER — Telehealth: Payer: Self-pay | Admitting: Internal Medicine

## 2016-03-14 ENCOUNTER — Other Ambulatory Visit: Payer: Self-pay | Admitting: *Deleted

## 2016-03-14 MED ORDER — INSULIN GLARGINE 100 UNIT/ML SOLOSTAR PEN: 50.0000 [IU] | PEN_INJECTOR | Freq: Every day | SUBCUTANEOUS | Status: DC

## 2016-03-14 NOTE — Telephone Encounter (Signed)
Switched pt back to Lantus per his request and sent to [facility] for him.

## 2016-03-14 NOTE — Telephone Encounter (Signed)
Please call in the lantus to [facility] to replace the toujeo

## 2016-03-14 NOTE — Telephone Encounter (Signed)
Opened encounter in error  

## 2016-03-15 ENCOUNTER — Telehealth: Payer: Self-pay | Admitting: Internal Medicine

## 2016-03-15 MED ORDER — INSULIN GLARGINE 100 UNIT/ML SOLOSTAR PEN: 50.0000 [IU] | PEN_INJECTOR | Freq: Every day | SUBCUTANEOUS | Status: DC

## 2016-03-15 NOTE — Telephone Encounter (Signed)
Lantus has now been sent to [facility] for pt.

## 2016-03-15 NOTE — Telephone Encounter (Signed)
PT said that his Lantus was supposed to be called into [facility] for 3 month supply and it was called into [facility] for one month supply.

## 2016-05-03 ENCOUNTER — Ambulatory Visit (INDEPENDENT_AMBULATORY_CARE_PROVIDER_SITE_OTHER): Payer: Medicare Other | Admitting: Internal Medicine

## 2016-05-03 ENCOUNTER — Encounter: Payer: Self-pay | Admitting: Internal Medicine

## 2016-05-03 VITALS — BP 140/90 | HR 68 | Ht 67.0 in | Wt 309.0 lb

## 2016-05-03 DIAGNOSIS — E1165 Type 2 diabetes mellitus with hyperglycemia: Secondary | ICD-10-CM | POA: Diagnosis not present

## 2016-05-03 DIAGNOSIS — IMO0002 Reserved for concepts with insufficient information to code with codable children: Secondary | ICD-10-CM

## 2016-05-03 DIAGNOSIS — E1142 Type 2 diabetes mellitus with diabetic polyneuropathy: Secondary | ICD-10-CM

## 2016-05-03 LAB — POCT GLYCOSYLATED HEMOGLOBIN (HGB A1C): HEMOGLOBIN A1C: 7.7

## 2016-05-03 MED ORDER — INSULIN LISPRO 100 UNIT/ML (KWIKPEN): 20.0000 [IU] | PEN_INJECTOR | Freq: Every day | SUBCUTANEOUS | Status: DC

## 2016-05-03 NOTE — Addendum Note (Signed)
Addended by: Darene LamerHOMPSON, Almus Woodham T on: 05/03/2016 09:39 AM   Modules accepted: Orders

## 2016-05-03 NOTE — Patient Instructions (Addendum)
Please continue: - Lantus 50 units at bedtime  - Metformin 1000 mg 2x a daily  - Glipizide 10 mg 2x a day  Please increase Humalog to 20 units before dinner.  Please look up different diets (Weight watchers?).  Please return in 3 month with your sugar log.

## 2016-05-03 NOTE — Progress Notes (Signed)
Patient ID: Christian [name], male   DOB: 03/29/1951, 65 y.o.   MRN: [id number]  HPI: Christian [name] is a 65 y.o.-year-old male, returning for f/u for DM2, dx 2009, insulin-dependent since 2014, uncontrolled, with complications (Peripheral neuropathy). Last visit 3 mo ago. [name] + [employer] now.  He retired since last visit. His wife started to work 12 pm-9 pm >> he started to skip b'fast >> has a brunch.  Last hemoglobin A1c was: Lab Results  Component Value Date   HGBA1C 7.6 01/27/2016   HGBA1C 7.4 10/29/2015   HGBA1C 6.8 07/30/2015   Pt was on a regimen of: - Metformin 1000 mg po bid - Lantus pen 78 units qhs (1 week ago increase from 76) - bedtime He has been on Victoza in the past >> not efficient anymore.  Pt is now on: - Toujeo 60 >> Lantus 50 units at bedtime - Humalog 15 units before dinner - started 01/2016 - Metformin 1000 mg 2x a daily - Glipizide 10 mg bid  Stopped Jardiance 25 mg >> but too expensive as he is in the donut hole Glipizide and Invokana were started 03/2014. We switched to Jardiance per insurance preference.  Pt checks his sugars 2x a day: - am: 72, 94-140s, some 160s >> 93-154 >> 70-125 >> 77-136, 168 >> 100-130s >> 130s >> 105-130 >> 120-130s, 180 - 2h after b'fast: n/c >> 157 >> n/c - before lunch: n/c >> 190, 203 >> 189-228 >> n/c - 2h after lunch: 150-360 >> <180, 220 x1, 239 x1 >> n/c - before dinner: n/c >> 107, 156 >> n/c - 2h after dinner: n/c - bedtime: 97-248 >> 135-145, 160, 220 >> 155-190, 200s if forgets glipizide >> 180s, 235 >> 182-260 >> 230s, 318 last night - nighttime: n/c No lows. Lowest sugar was 90s >> 105 >> low 100s; he has hypoglycemia awareness at 90.  Highest sugar was 360 >> 239 >> 250 >> 257 >> 264 >> 200s >> 248 >> 220 >> 280 >> 318 at night  Meter: OneTouch Ultra Mini  Pt's meals are: - Brunch: dry cereal + raisins + no milk; misc. Fruit (banana/orange); V8 juice - Dinner: usually frozen meal  - Snacks: 2-3  nabs  - no CKD, last BUN/creatinine:  Lab Results  Component Value Date   BUN 14 08/20/2015   CREATININE 0.75 08/20/2015  Not on ACEI. - last set of lipids: Lab Results  Component Value Date   CHOL 152 08/20/2015   HDL 36.30* 08/20/2015   LDLCALC 92 08/20/2015   TRIG 116.0 08/20/2015   CHOLHDL 4 08/20/2015  Not on statins b/c leg cramps. - last eye exam was in 10/2015 - gets the eye exams every year [name] No DR.  - + numbness and tingling in his feet.  He had TKR in 04/2015.  I reviewed pt's medications, allergies, PMH, social hx, family hx, and changes were documented in the history of present illness. Otherwise, unchanged from my initial visit note.  ROS: Constitutional: no weight gain/loss, no fatigue, no subjective hyperthermia/hypothermia, no nocturia Eyes: no blurry vision, no xerophthalmia ENT: no sore throat, no nodules palpated in throat, no dysphagia/odynophagia, no hoarseness Cardiovascular: no CP/SOB/palpitations/decreased leg swelling Respiratory: no cough/SOB Gastrointestinal: no N/V/D/C Musculoskeletal: no muscle/+ joint aches Skin: no rashes Neurological: no tremors/numbness/tingling/dizziness  PE: BP 140/90 mmHg  Pulse 68  Ht 5\' 7"  (1.702 m)  Wt 309 lb (140.161 kg)  BMI 48.38 kg/m2  SpO2 95% Body mass index is  48.38 kg/(m^2).   Wt Readings from Last 3 Encounters:  05/03/16 309 lb (140.161 kg)  01/27/16 303 lb (137.44 kg)  12/21/15 302 lb 8 oz (137.213 kg)   Constitutional: obese, in NAD Eyes: PERRLA, EOMI, no exophthalmos ENT: moist mucous membranes, no thyromegaly, no cervical lymphadenopathy Cardiovascular: RRR, No MRG, + L>R leg pitting edema Respiratory: CTA B Gastrointestinal: abdomen soft, NT, ND, BS+ Musculoskeletal: no deformities except bilateral flat feet, strength intact in all 4 Skin: moist, warm, no rashes; Neurological: no tremor with outstretched hands, DTR normal in all 4  ASSESSMENT: 1. DM2, insulin-dependent,  uncontrolled, with complications - PN  PLAN:  1. Patient with uncontrolled diabetes, on oral meds + basal insulin, with worse sugars after dinner, despite adding Humalog with this meal. Sugars in am are at goal - His weight increase by 6 lbs since last visit. We discussed that he needs to start changing his diet to lose weight ang gain more control of his DM. - again offered to refer him to nutrition >> will think about it - for now will increase Humalog with dinner: Patient Instructions  Please continue: - Lantus 50 units at bedtime  - Metformin 1000 mg 2x a daily  - Glipizide 10 mg 2x a day  Please increase Humalog to 20 units before dinner.  Please look up different diets (Weight watchers?).  Please return in 3 month with your sugar log.   - UTD with eye exams - check HbA1c today >> 7.7% (slightly higher!) - refused flu shot this season - Return to clinic in 3 mo with sugar log

## 2016-07-22 ENCOUNTER — Other Ambulatory Visit: Payer: Self-pay | Admitting: Internal Medicine

## 2016-08-03 ENCOUNTER — Ambulatory Visit (INDEPENDENT_AMBULATORY_CARE_PROVIDER_SITE_OTHER): Payer: Medicare Other | Admitting: Internal Medicine

## 2016-08-03 ENCOUNTER — Encounter: Payer: Self-pay | Admitting: Internal Medicine

## 2016-08-03 VITALS — BP 128/84 | HR 63 | Ht 66.5 in | Wt 313.0 lb

## 2016-08-03 DIAGNOSIS — E1165 Type 2 diabetes mellitus with hyperglycemia: Secondary | ICD-10-CM | POA: Diagnosis not present

## 2016-08-03 DIAGNOSIS — IMO0002 Reserved for concepts with insufficient information to code with codable children: Secondary | ICD-10-CM

## 2016-08-03 DIAGNOSIS — E1142 Type 2 diabetes mellitus with diabetic polyneuropathy: Secondary | ICD-10-CM | POA: Diagnosis not present

## 2016-08-03 LAB — POCT GLYCOSYLATED HEMOGLOBIN (HGB A1C): Hemoglobin A1C: 7.3

## 2016-08-03 NOTE — Addendum Note (Signed)
Addended by: Darene LamerHOMPSON, Geran Haithcock T on: 08/03/2016 10:24 AM   Modules accepted: Orders

## 2016-08-03 NOTE — Patient Instructions (Addendum)
Please continue: - Lantus 50 units at bedtime  - Metformin 1000 mg 2x a daily   - Humalog 20 units before dinner.  Please move Glipizide 10 mg 2x a day to before meals.  Please return in 3 month with your sugar log.

## 2016-08-03 NOTE — Progress Notes (Signed)
Patient ID: Elba BarmanGeorge E Patton, male   DOB: 08/03/1951, 65 y.o.   MRN: [id number]  HPI: Elba BarmanGeorge E Takagi is a 65 y.o.-year-old male, returning for f/u for DM2, dx 2009, insulin-dependent since 2014, uncontrolled, with complications (Peripheral neuropathy). Last visit 3 mo ago. [name] + [employer] now.  He had a lot of stress t home: wife with back surgery, bilateral PE recently. She is now well, accompanies him today.   Last hemoglobin A1c was: Lab Results  Component Value Date   HGBA1C 7.7 05/03/2016   HGBA1C 7.6 01/27/2016   HGBA1C 7.4 10/29/2015   Pt was on a regimen of: - Metformin 1000 mg po bid - Lantus pen 78 units qhs (1 week ago increase from 76) - bedtime He has been on Victoza in the past >> not efficient anymore.  Pt is now on: - Toujeo 60 >> Lantus 50 units at bedtime - Humalog 15 >> 20 units before dinner - started 01/2016 - Metformin 1000 mg 2x a daily - Glipizide 10 mg 2x a day  Stopped Jardiance 25 mg >> but too expensive as he is in the donut hole; had yeast infections. Glipizide and Invokana were started 03/2014. We switched to Jardiance per insurance preference.  Pt checks his sugars 2x a day: - am: 93-154 >> 70-125 >> 77-136, 168 >> 100-130s >> 130s >> 105-130 >> 120-130s, 180 >> 82, 100-130, 158, 165 - 2h after b'fast: n/c >> 157 >> n/c - before lunch: n/c >> 190, 203 >> 189-228 >> n/c - 2h after lunch: 150-360 >> <180, 220 x1, 239 x1 >> n/c >> 185-190 - before dinner: n/c >> 107, 156 >> n/c >> 150-160 - 2h after dinner: n/c - bedtime: 155-190, 200s if forgets glipizide >> 180s, 235 >> 182-260 >> 230s, 318 last night >> n/c - nighttime: n/c No lows. Lowest sugar was 90s >> 105 >> low 100s >> 82; he has hypoglycemia awareness at 90.  Highest sugar was 318 at night >> 190  Meter: OneTouch Ultra Mini  Pt's meals are: - Brunch: dry cereal + raisins + no milk; misc. Fruit (banana/orange); V8 juice - Dinner: usually frozen meal  - Snacks: 2-3 nabs  - no CKD,  last BUN/creatinine:  Lab Results  Component Value Date   BUN 14 08/20/2015   CREATININE 0.75 08/20/2015  Not on ACEI. - last set of lipids: Lab Results  Component Value Date   CHOL 152 08/20/2015   HDL 36.30 (L) 08/20/2015   LDLCALC 92 08/20/2015   TRIG 116.0 08/20/2015   CHOLHDL 4 08/20/2015  Not on statins b/c leg cramps. - last eye exam was in 10/2015 - gets the eye exams every year [facility] No DR.  - + numbness and tingling in his feet.  He had TKR in 04/2015.  He has OSA.  I reviewed pt's medications, allergies, PMH, social hx, family hx, and changes were documented in the history of present illness. Otherwise, unchanged from my initial visit note.  ROS: Constitutional: + weight gain, no fatigue, no subjective hyperthermia/hypothermia, no nocturia Eyes: no blurry vision, no xerophthalmia ENT: no sore throat, no nodules palpated in throat, no dysphagia/odynophagia, no hoarseness Cardiovascular: no CP/SOB/palpitations/decreased leg swelling Respiratory: no cough/SOB Gastrointestinal: no N/V/D/C Musculoskeletal: no muscle/+ joint aches Skin: no rashes Neurological: no tremors/numbness/tingling/dizziness  PE: BP 128/84 (BP Location: Left Arm, Patient Position: Sitting)   Pulse 63   Ht 5' 6.5" (1.689 m)   Wt (!) 313 lb (142 kg)   SpO2  96%   BMI 49.76 kg/m  Body mass index is 49.76 kg/m.   Wt Readings from Last 3 Encounters:  08/03/16 (!) 313 lb (142 kg)  05/03/16 (!) 309 lb (140.2 kg)  01/27/16 (!) 303 lb (137.4 kg)   Constitutional: obese, in NAD Eyes: PERRLA, EOMI, no exophthalmos ENT: moist mucous membranes, no thyromegaly, no cervical lymphadenopathy Cardiovascular: RRR, No MRG, + L>R leg pitting edema Respiratory: CTA B Gastrointestinal: abdomen soft, NT, ND, BS+ Musculoskeletal: no deformities except bilateral flat feet, strength intact in all 4 Skin: moist, warm, no rashes; Neurological: no tremor with outstretched hands, DTR normal in all  4  ASSESSMENT: 1. DM2, insulin-dependent, uncontrolled, with complications - PN  PLAN:  1. Patient with uncontrolled diabetes, on oral meds + basal insulin, with still high sugars c/w last visit.  - His weight increase by 4 lbs since last visit. At last visit, we discussed that he needs to start changing his diet to lose weight ang gain more control of his DM, but he was not able to do this... - for now will try to move Glipizide to before meals as he is now taking it w/o a relationship with meals.  - starting the new year, his wife will add him on her insurance >> we will hopefully be able to add a GLP1 R agonist. He had yeast inf's with SGLT2 inh. Patient Instructions  Please continue: - Lantus 50 units at bedtime  - Metformin 1000 mg 2x a daily   - Humalog 20 units before dinner.  Please move Glipizide 10 mg 2x a day to before meals.  Please return in 3 month with your sugar log.   - UTD with eye exams - check HbA1c today >> 7.4% (better!) - refused flu shot again today - Return to clinic in 3 mo with sugar log  Carlus Pavlov, MD PhD [facility] Endocrinology

## 2016-09-08 ENCOUNTER — Other Ambulatory Visit: Payer: Self-pay | Admitting: Internal Medicine

## 2016-09-21 ENCOUNTER — Encounter: Payer: Self-pay | Admitting: Family Medicine

## 2016-09-21 ENCOUNTER — Ambulatory Visit
Admission: RE | Admit: 2016-09-21 | Discharge: 2016-09-21 | Disposition: A | Discharge status: Home / Self Care | Payer: Medicare Other | Source: Ambulatory Visit | Attending: Family Medicine | Admitting: Family Medicine

## 2016-09-21 ENCOUNTER — Ambulatory Visit (INDEPENDENT_AMBULATORY_CARE_PROVIDER_SITE_OTHER): Payer: Medicare Other | Admitting: Family Medicine

## 2016-09-21 VITALS — BP 120/66 | HR 63 | Temp 97.9°F | Ht 66.5 in | Wt 313.2 lb

## 2016-09-21 DIAGNOSIS — R350 Frequency of micturition: Secondary | ICD-10-CM

## 2016-09-21 DIAGNOSIS — E1165 Type 2 diabetes mellitus with hyperglycemia: Secondary | ICD-10-CM | POA: Diagnosis not present

## 2016-09-21 DIAGNOSIS — E1142 Type 2 diabetes mellitus with diabetic polyneuropathy: Secondary | ICD-10-CM

## 2016-09-21 DIAGNOSIS — M79604 Pain in right leg: Secondary | ICD-10-CM

## 2016-09-21 DIAGNOSIS — I83813 Varicose veins of bilateral lower extremities with pain: Secondary | ICD-10-CM

## 2016-09-21 DIAGNOSIS — M79605 Pain in left leg: Secondary | ICD-10-CM | POA: Diagnosis not present

## 2016-09-21 DIAGNOSIS — M545 Low back pain, unspecified: Secondary | ICD-10-CM

## 2016-09-21 DIAGNOSIS — Z79899 Other long term (current) drug therapy: Secondary | ICD-10-CM | POA: Diagnosis not present

## 2016-09-21 DIAGNOSIS — IMO0002 Reserved for concepts with insufficient information to code with codable children: Secondary | ICD-10-CM

## 2016-09-21 LAB — POC URINALSYSI DIPSTICK (AUTOMATED)
BILIRUBIN UA: NEGATIVE
GLUCOSE UA: NEGATIVE
KETONES UA: NEGATIVE
Leukocytes, UA: NEGATIVE
Nitrite, UA: NEGATIVE
RBC UA: NEGATIVE
UROBILINOGEN UA: 0.2
pH, UA: 6

## 2016-09-21 LAB — BASIC METABOLIC PANEL
BUN: 15 mg/dL (ref 6–23)
CALCIUM: 9.6 mg/dL (ref 8.4–10.5)
CO2: 27 meq/L (ref 19–32)
CREATININE: 0.85 mg/dL (ref 0.40–1.50)
Chloride: 103 mEq/L (ref 96–112)
GFR: 95.87 mL/min (ref >60.00)
GLUCOSE: 106 mg/dL — AB (ref 70–99)
Potassium: 4.4 mEq/L (ref 3.5–5.1)
Sodium: 141 mEq/L (ref 135–145)

## 2016-09-21 LAB — CBC WITH DIFFERENTIAL/PLATELET
BASOS ABS: 0.1 10*3/uL (ref 0.0–0.1)
Basophils Relative: 0.7 % (ref 0.0–3.0)
Eosinophils Absolute: 0.5 10*3/uL (ref 0.0–0.7)
Eosinophils Relative: 3.4 % (ref 0.0–5.0)
HCT: 41 % (ref 39.0–52.0)
Hemoglobin: 13.6 g/dL (ref 13.0–17.0)
LYMPHS ABS: 2.8 10*3/uL (ref 0.7–4.0)
Lymphocytes Relative: 21.1 % (ref 12.0–46.0)
MCHC: 33.3 g/dL (ref 30.0–36.0)
MCV: 95.3 fl (ref 78.0–100.0)
MONO ABS: 0.9 10*3/uL (ref 0.1–1.0)
Monocytes Relative: 6.7 % (ref 3.0–12.0)
NEUTROS ABS: 9 10*3/uL — AB (ref 1.4–7.7)
NEUTROS PCT: 68.1 % (ref 43.0–77.0)
PLATELETS: 231 10*3/uL (ref 150.0–400.0)
RBC: 4.3 Mil/uL (ref 4.22–5.81)
RDW: 14.1 % (ref 11.5–15.5)
WBC: 13.2 10*3/uL — ABNORMAL HIGH (ref 4.0–10.5)

## 2016-09-21 LAB — LIPID PANEL
CHOLESTEROL: 167 mg/dL (ref 0–200)
HDL: 38.5 mg/dL — AB (ref >39.00)
LDL Cholesterol: 99 mg/dL (ref 0–99)
NonHDL: 128.4
Total CHOL/HDL Ratio: 4
Triglycerides: 146 mg/dL (ref 0.0–149.0)
VLDL: 29.2 mg/dL (ref 0.0–40.0)

## 2016-09-21 LAB — HEPATIC FUNCTION PANEL
ALBUMIN: 4.3 g/dL (ref 3.5–5.2)
ALK PHOS: 74 U/L (ref 39–117)
ALT: 27 U/L (ref 0–53)
AST: 24 U/L (ref 0–37)
BILIRUBIN TOTAL: 0.7 mg/dL (ref 0.2–1.2)
Bilirubin, Direct: 0.2 mg/dL (ref 0.0–0.3)
Total Protein: 7.4 g/dL (ref 6.0–8.3)

## 2016-09-21 LAB — PSA: PSA: 0.87 ng/mL (ref 0.10–4.00)

## 2016-09-21 NOTE — Progress Notes (Signed)
Pre visit review using our clinic review tool, if applicable. No additional management support is needed unless otherwise documented below in the visit note. 

## 2016-09-21 NOTE — Progress Notes (Signed)
Dr. Karleen HampshireSpencer T. Egypt Marchiano, MD, CAQ Sports Medicine [facility] [address] [place], [id number] Phone: [phone] Fax: [phone]  09/21/2016  Patient: Christian [name], MRN: [id number], DOB: 05/08/1951, 65 y.o.  Primary Physician:  Hannah BeatSpencer Felma Pfefferle, MD   Chief Complaint  Patient presents with  . Back Pain    Lower Back  . Lumps in Right Leg  . Urinary Frequency   Subjective:   Christian [name] is a 65 y.o. very pleasant male patient who presents with the following:  R lumps / vascular changes.  He has some extensive varicose veins, right so they become tender. He has some pain in the posterior aspect of his calf is well over the last 2 weeks.  For the last few weeks, some pain in the lower back Increased frequency He has had some burning, urgency, and pain. He has no STD contacts, and has been faithful to his wife for more than 50 years.  Past Medical History, Surgical History, Social History, Family History, Problem List, Medications, and Allergies have been reviewed and updated if relevant.  Patient Active Problem List   Diagnosis Date Noted  . Uncontrolled type 2 diabetes mellitus with peripheral neuropathy (HCC) 01/27/2016  . Osteoarthritis of right knee 05/01/2015  . 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 Benign paroxysmal positional vertigo 12/09/2013  . Eustachian tube dysfunction 12/09/2013  . Gout 09/13/2013  . Chronic pain syndrome 09/13/2013  . Osteoarthritis 09/13/2013  . Chronic back pain 09/13/2013  . Morbid obesity with body mass index of 45.0-49.9 in adult [facility](HCC) 09/13/2013  . Seizures (HCC)   . Hypertension     Past Medical History:  Diagnosis Date  . Chronic back pain 09/13/2013  . Chronic pain syndrome 09/13/2013  . GERD (gastroesophageal reflux disease)    hx. of  . Gout 09/13/2013  . Hemorrhoids   . Hypertension   . Morbid obesity with body mass index of 45.0-49.9 in adult [facility](HCC) 09/13/2013  .  Osteoarthritis 09/13/2013  . Pneumonia    hx. of  . Seizures (HCC)   . Sleep apnea    has never used C-pap machine due to insurance cost  . Type 1 diabetes mellitus, uncontrolled (HCC)     Past Surgical History:  Procedure Laterality Date  . broken toe 35 years ago    . TOTAL KNEE ARTHROPLASTY Right 05/01/2015   Procedure: RIGHT TOTAL KNEE ARTHROPLASTY;  Surgeon: Kathryne Hitchhristopher Y Blackman, MD;  Location: [facility];  Service: Orthopedics;  Laterality: Right;  . WISDOM TOOTH EXTRACTION      Social History   Social History  . Marital status: Married    Spouse name: N/A  . Number of children: N/A  . Years of education: N/A   Occupational History  . Not on file.   Social History Main Topics  . Smoking status: Former Smoker    Quit date: 03/08/2004  . Smokeless tobacco: Never Used  . Alcohol use No     Comment: rare  . Drug use: No  . Sexual activity: Yes    Partners: Female   Other Topics Concern  . Not on file   Social History Narrative   Psychiatric nurseT Manager [employer]   Married    Family History  Problem Relation Age of Onset  . Alcohol abuse Father   . Diabetes Maternal Aunt   . Arthritis Maternal Grandmother   . Alcohol abuse Paternal Grandmother   . Alcohol abuse Paternal Grandfather  Allergies  Allergen Reactions  . Penicillins     REACTION: swelling, rash    Medication list reviewed and updated in full in [place] [facility].   GEN: No acute illnesses, no fevers, chills. GI: No n/v/d, eating normally Pulm: No SOB Interactive and getting along well at home.  Otherwise, ROS is as per the HPI.  Objective:   BP 120/66   Pulse 63   Temp 97.9 F (36.6 C) (Oral)   Ht 5' 6.5" (1.689 m)   Wt (!) 313 lb 4 oz (142.1 kg)   BMI 49.80 kg/m   GEN: WDWN, NAD, Non-toxic, A & O x 3 HEENT: Atraumatic, Normocephalic. Neck supple. No masses, No LAD. Ears and Nose: No external deformity. CV: RRR, No M/G/R. No JVD. No thrill. No extra heart sounds. PULM: CTA B, no  wheezes, crackles, rhonchi. No retractions. No resp. distress. No accessory muscle use. EXTR: No c/c/e NEURO Normal gait.  PSYCH: Normally interactive. Conversant. Not depressed or anxious appearing.  Calm demeanor.   Extensive vascular changes in bilateral lower extremities with extensive varicose veins. He does have some tenderness with squeezing his calf and the right.  Laboratory and Imaging Data: [place] Venous Img Lower Bilateral  Result Date: 09/21/2016 CLINICAL DATA:  Leg pain, right side greater than left for 2 weeks. EXAM: BILATERAL LOWER EXTREMITY VENOUS DOPPLER ULTRASOUND TECHNIQUE: Gray-scale sonography with graded compression, as well as color Doppler and duplex ultrasound were performed to evaluate the lower extremity deep venous systems from the level of the common femoral vein and including the common femoral, femoral, profunda femoral, popliteal and calf veins including the posterior tibial, peroneal and gastrocnemius veins when visible. The superficial great saphenous vein was also interrogated. Spectral Doppler was utilized to evaluate flow at rest and with distal augmentation maneuvers in the common femoral, femoral and popliteal veins. COMPARISON:  None. FINDINGS: RIGHT LOWER EXTREMITY Common Femoral Vein: No evidence of thrombus. Normal compressibility, respiratory phasicity and response to augmentation. Saphenofemoral Junction: No evidence of thrombus. Normal compressibility and flow on color Doppler imaging. Profunda Femoral Vein: No evidence of thrombus. Normal compressibility and flow on color Doppler imaging. Femoral Vein: No evidence of thrombus. Normal compressibility, respiratory phasicity and response to augmentation. Popliteal Vein: No evidence of thrombus. Normal compressibility, respiratory phasicity and response to augmentation. Calf Veins: No evidence of thrombus. Normal compressibility and flow on color Doppler imaging. LEFT LOWER EXTREMITY Common Femoral Vein: No evidence  of thrombus. Normal compressibility, respiratory phasicity and response to augmentation. Saphenofemoral Junction: No evidence of thrombus. Normal compressibility and flow on color Doppler imaging. Profunda Femoral Vein: No evidence of thrombus. Normal compressibility and flow on color Doppler imaging. Femoral Vein: No evidence of thrombus. Normal compressibility, respiratory phasicity and response to augmentation. Popliteal Vein: No evidence of thrombus. Normal compressibility, respiratory phasicity and response to augmentation. Calf Veins: No evidence of thrombus. Normal compressibility and flow on color Doppler imaging. IMPRESSION: No evidence of deep venous thrombosis. Electronically Signed   By: Richarda OverlieAdam  Henn M.D.   On: 09/21/2016 12:53     Assessment and Plan:   Varicose veins of bilateral lower extremities with pain  Urinary frequency - Plan: POCT Urinalysis Dipstick (Automated), PSA  Right leg pain - Plan: US Venous Img Lower Bilateral  Uncontrolled type 2 diabetes mellitus with peripheral neuropathy (HCC) - Plan: Basic metabolic panel, CBC with Differential/Platelet, Hepatic function panel, Lipid panel  Encounter for long-term (current) use of medications - Plan: CBC with Differential/Platelet, Hepatic function panel  Acute  bilateral low back pain without sciatica  No DVT. Painful varicose veins. Vascular consult would not be unreasonable at any point.  Some dehydration with specific gravity is 1.030, no other sign of infection. Reassurance.  Follow-up in work which has not been done in quite some time.  Follow-up: No Follow-up on file.  There are no discontinued medications. Orders Placed This Encounter  Procedures  . US Venous Img Lower Bilateral  . Basic metabolic panel  . CBC with Differential/Platelet  . Hepatic function panel  . PSA  . Lipid panel  . POCT Urinalysis Dipstick (Automated)    Signed,  Caston Coopersmith T. Aveyah Greenwood, MD     Medication List       Accurate as  of 09/21/16  2:00 PM. Always use your most recent med list.          aspirin 81 MG tablet Take 81 mg by mouth once.   bisoprolol-hydrochlorothiazide 10-6.25 MG tablet Commonly known as:  ZIAC Take 1 tablet by mouth daily.   diclofenac 75 MG EC tablet Commonly known as:  VOLTAREN Take 1 tablet (75 mg total) by mouth 2 (two) times daily.   glipiZIDE 10 MG tablet Commonly known as:  GLUCOTROL TAKE 1 TABLET TWICE A DAY BEFORE MEAL   glucose blood test strip Commonly known as:  ONE TOUCH TEST STRIPS Use as instructed   Insulin Glargine 100 UNIT/ML Solostar Pen Commonly known as:  LANTUS SOLOSTAR Inject 50 Units into the skin at bedtime.   insulin lispro 100 UNIT/ML KiwkPen Commonly known as:  HUMALOG KWIKPEN Inject 0.2 mLs (20 Units total) into the skin daily before supper.   metFORMIN 1000 MG tablet Commonly known as:  GLUCOPHAGE TAKE 1 TABLET BY MOUTH TWICE A DAY WITH A MEAL   NOVOTWIST 32G X 5 MM Misc Generic drug:  Insulin Pen Needle USE AS DIRECTED   NOVOTWIST 32G X 5 MM Misc Generic drug:  Insulin Pen Needle USE AS DIRECTED   oxyCODONE-acetaminophen 5-325 MG tablet Commonly known as:  PERCOCET/ROXICET Take 1 tablet by mouth 2 (two) times daily as needed for severe pain. Reported on 05/03/2016

## 2016-09-21 NOTE — Patient Instructions (Signed)

## 2016-09-23 ENCOUNTER — Encounter: Payer: Self-pay | Admitting: Family Medicine

## 2016-11-06 ENCOUNTER — Other Ambulatory Visit: Payer: Self-pay | Admitting: Internal Medicine

## 2016-11-08 ENCOUNTER — Ambulatory Visit (INDEPENDENT_AMBULATORY_CARE_PROVIDER_SITE_OTHER): Payer: PRIVATE HEALTH INSURANCE | Admitting: Internal Medicine

## 2016-11-08 ENCOUNTER — Encounter: Payer: Self-pay | Admitting: Internal Medicine

## 2016-11-08 VITALS — BP 128/80 | HR 82 | Ht 67.0 in | Wt 318.0 lb

## 2016-11-08 DIAGNOSIS — E1142 Type 2 diabetes mellitus with diabetic polyneuropathy: Secondary | ICD-10-CM | POA: Diagnosis not present

## 2016-11-08 DIAGNOSIS — E1165 Type 2 diabetes mellitus with hyperglycemia: Secondary | ICD-10-CM

## 2016-11-08 DIAGNOSIS — IMO0002 Reserved for concepts with insufficient information to code with codable children: Secondary | ICD-10-CM

## 2016-11-08 LAB — POCT GLYCOSYLATED HEMOGLOBIN (HGB A1C): HEMOGLOBIN A1C: 8.8

## 2016-11-08 MED ORDER — INSULIN LISPRO 100 UNIT/ML (KWIKPEN): 20.0000 [IU] | PEN_INJECTOR | Freq: Every day | SUBCUTANEOUS | 2 refills | Status: DC

## 2016-11-08 MED ORDER — INSULIN PEN NEEDLE 32G X 5 MM MISC: 6 refills | Status: DC

## 2016-11-08 MED ORDER — DULAGLUTIDE 0.75 MG/0.5ML ~~LOC~~ SOAJ: SUBCUTANEOUS | 1 refills | Status: DC

## 2016-11-08 NOTE — Patient Instructions (Addendum)
Please continue: - Lantus 50 units at bedtime   - Humalog 20 units before dinner. - Metformin 1000 mg 2x a daily   - Glipizide 10 mg 2x a day before meals.  Please add: - Trulicity 0.75 mg under skin weekly   Please let me know in 3 weeks to send a prescription for the higher dose, 1.5 mg weekly.  Please return in 3 month with your sugar log.

## 2016-11-08 NOTE — Addendum Note (Signed)
Addended by: Darene LamerHOMPSON, Spirit Wernli T on: 11/08/2016 10:00 AM   Modules accepted: Orders

## 2016-11-08 NOTE — Progress Notes (Signed)
Patient ID: Christian [name], male   DOB: 1951/04/02, 66 y.o.   MRN: [id number]  HPI: Christian [name] is a 66 y.o.-year-old male, returning for f/u for DM2, dx 2009, insulin-dependent since 2014, uncontrolled, with complications (Peripheral neuropathy). Last visit 3 mo ago. [name] + [employer] now.   Last hemoglobin A1c was: Lab Results  Component Value Date   HGBA1C 7.3 08/03/2016   HGBA1C 7.7 05/03/2016   HGBA1C 7.6 01/27/2016   Pt was on a regimen of: - Metformin 1000 mg po bid - Lantus pen 78 units qhs (1 week ago increase from 76) - bedtime He has been on Victoza in the past >> not efficient anymore.  Pt is now on: - Toujeo 60 >> Lantus 50 units at bedtime - Humalog 15 >> 20 units before dinner - started 01/2016 - ran out 1 week ago - Metformin 1000 mg 2x a daily - Glipizide 10 mg 2x a day  Stopped Jardiance 25 mg >> but too expensive as he is in the donut hole; had yeast infections. Glipizide and Invokana were started 03/2014. We switched to Jardiance per insurance preference.  Pt checks his sugars 2x a day: - am: 100-130s >> 130s >> 105-130 >> 120-130s, 180 >> 82, 100-130, 158, 165 >> 90-120 before ran out of Humalog - 2h after b'fast: n/c >> 157 >> n/c - before lunch: n/c >> 190, 203 >> 189-228 >> n/c - 2h after lunch: 150-360 >> <180, 220 x1, 239 x1 >> n/c >> 185-190 >> n/c - before dinner: n/c >> 107, 156 >> n/c >> 150-160 >> 180s - 2h after dinner: n/c - bedtime: 155-190, 200s if forgets glipizide >> 180s, 235 >> 182-260 >> 230s, 318 last night >> n/c - nighttime: n/c No lows. Lowest sugar was 90s >> 105 >> low 100s >> 82 >> 90; he has hypoglycemia awareness at 90.  Highest sugar was 318 at night >> 190 >> 200s  Meter: OneTouch Ultra Mini  Pt's meals are: - Brunch: dry cereal + raisins + no milk; misc. Fruit (banana/orange); V8 juice - Dinner: usually frozen meal  - Snacks: 2-3 nabs  - no CKD, last BUN/creatinine:  Lab Results  Component Value Date   BUN 15  09/21/2016   CREATININE 0.85 09/21/2016  Not on ACEI. - last set of lipids: Lab Results  Component Value Date   CHOL 167 09/21/2016   HDL 38.50 (L) 09/21/2016   LDLCALC 99 09/21/2016   TRIG 146.0 09/21/2016   CHOLHDL 4 09/21/2016  Not on statins b/c leg cramps. - last eye exam was in 10/2015 - gets the eye exams every year [facility]), but skipped last year. No DR.  - + numbness and tingling in his feet.  He had TKR in 04/2015. He has OSA.  I reviewed pt's medications, allergies, PMH, social hx, family hx, and changes were documented in the history of present illness. Otherwise, unchanged from my initial visit note.  ROS: Constitutional: no weight gain, no fatigue, no subjective hyperthermia/hypothermia, no nocturia Eyes: no blurry vision, no xerophthalmia ENT: no sore throat, no nodules palpated in throat, no dysphagia/odynophagia, no hoarseness Cardiovascular: no CP/SOB/palpitations/decreased leg swelling Respiratory: no cough/SOB Gastrointestinal: no N/V/D/C Musculoskeletal: no muscle/+ joint aches Skin: no rashes Neurological: no tremors/numbness/tingling/dizziness  PE: BP 128/80 (BP Location: Left Arm, Patient Position: Sitting)   Pulse 82   Ht 5\' 7"  (1.702 m)   Wt (!) 318 lb (144.2 kg)   SpO2 93%   BMI 49.81 kg/m  Body mass index is 49.81 kg/m.   Wt Readings from Last 3 Encounters:  11/08/16 (!) 318 lb (144.2 kg)  09/21/16 (!) 313 lb 4 oz (142.1 kg)  08/03/16 (!) 313 lb (142 kg)   Constitutional: obese, in NAD Eyes: PERRLA, EOMI, no exophthalmos ENT: moist mucous membranes, no thyromegaly, no cervical lymphadenopathy Cardiovascular: RRR, No MRG, + L>R leg pitting edema Respiratory: CTA B Gastrointestinal: abdomen soft, NT, ND, BS+ Musculoskeletal: no deformities except bilateral flat feet, strength intact in all 4 Skin: moist, warm, no rashes; Neurological: no tremor with outstretched hands, DTR normal in all 4  ASSESSMENT: 1. DM2,  insulin-dependent, uncontrolled, with complications - PN  PLAN:  1. Patient with uncontrolled diabetes, on oral meds + basal insulin, with high sugars at last visit >> we moved Glipizide to before meals as he was taking it w/o a relationship with meals. At this visit, sugars are higher before dinner. He does not check at bedtime. Will refill Humalog and add Trulicity as in the new year, his wife added him on her insurance.  He had yeast inf's with SGLT2 inh. Patient Instructions  Please continue: - Lantus 50 units at bedtime   - Humalog 20 units before dinner. - Metformin 1000 mg 2x a daily   - Glipizide 10 mg 2x a day before meals.  Please add: - Trulicity 0.75 mg under skin weekly   Please let me know in 3 weeks to send a prescription for the higher dose, 1.5 mg weekly.  Please return in 3 month with your sugar log.   - needs a new with eye exam >> will schedule - check HbA1c today >> 8.8% (higher!) - refused flu shot at last visits - Return to clinic in 3 mo with sugar log  Carlus Pavlovristina Nakai Yard, MD PhD [facility]

## 2016-12-05 ENCOUNTER — Telehealth: Payer: Self-pay

## 2016-12-05 ENCOUNTER — Other Ambulatory Visit: Payer: Self-pay

## 2016-12-05 MED ORDER — DULAGLUTIDE 0.75 MG/0.5ML ~~LOC~~ SOAJ: SUBCUTANEOUS | 1 refills | Status: DC

## 2016-12-05 MED ORDER — INSULIN GLARGINE 100 UNIT/ML SOLOSTAR PEN: 50.0000 [IU] | PEN_INJECTOR | Freq: Every day | SUBCUTANEOUS | 1 refills | Status: DC

## 2016-12-05 NOTE — Telephone Encounter (Signed)
Called patient and spoke about medication questions. Patient is asking what you would like for him to do about the Trulicity. Patient states his sugars have been okay, running in the 128-108 range, he would like to know to keep the dose the same or to increase. Patient stated at last visit you may have to [facility] if insurance changed, so he was not sure which medication you would like for him to take. Please advise. Thank you!

## 2016-12-05 NOTE — Telephone Encounter (Signed)
Called patient and advised, sent in medications. Patient had no questions at this time.

## 2016-12-05 NOTE — Telephone Encounter (Signed)
Pt has to discuss med changes and pharmacy changes  [facility] [address] [facility]

## 2016-12-05 NOTE — Telephone Encounter (Signed)
As I advised him at last visit, he can use 50 units of Lantus instead of the toujeo. I am happy his sugars are better. We can definitely stay on the current dose of Trulicity if the sugars are so good

## 2016-12-07 ENCOUNTER — Other Ambulatory Visit: Payer: Self-pay | Admitting: Family Medicine

## 2016-12-07 NOTE — Telephone Encounter (Signed)
He is seeing Dr. Lafe GarinGherge now for DM management - I will send to her to make sure following her program.

## 2016-12-12 ENCOUNTER — Other Ambulatory Visit: Payer: Self-pay

## 2016-12-12 NOTE — Telephone Encounter (Deleted)
Rx sent electronically.  

## 2016-12-23 ENCOUNTER — Telehealth: Payer: Self-pay

## 2016-12-23 ENCOUNTER — Telehealth: Payer: Self-pay | Admitting: Internal Medicine

## 2016-12-23 MED ORDER — INSULIN PEN NEEDLE 32G X 5 MM MISC: 6 refills | Status: DC

## 2016-12-23 MED ORDER — METFORMIN HCL 1000 MG PO TABS: ORAL_TABLET | ORAL | 3 refills | Status: DC

## 2016-12-23 MED ORDER — DULAGLUTIDE 1.5 MG/0.5ML ~~LOC~~ SOAJ: SUBCUTANEOUS | 3 refills | Status: DC

## 2016-12-23 NOTE — Telephone Encounter (Signed)
Called patient and advised of Dr.Gherghe's note, submitted new dose of medication. Patient had no questions at this time.

## 2016-12-23 NOTE — Telephone Encounter (Signed)
Patient call in and stated that his blood sugars have been off the charts again. The past week and last week are: 2/16- 206 2/15- 214 2/14- 213 2/13- 149 2/12- 160 2/11- 188 2/10- 179  And the previous week were in the range of 222,267,236,203.   Patient states he has not changed anything, and his eating habits are the same, and no new medications.   Please advise on what to tell patient to do. Thank you!

## 2016-12-23 NOTE — Telephone Encounter (Signed)
Pt called in to request a refill of his 32G Needles be sent to [facility] on [address]  He said that he is now using that pharmacy again. Also he wanted to speak with Raynelle FanningJulie regarding his sugar levels.

## 2016-12-23 NOTE — Telephone Encounter (Signed)
Called and notified patient, submitted new dose of medication.

## 2016-12-23 NOTE — Telephone Encounter (Signed)
Spoke with patient regarding sugar levels, sent message to MD. Submitted Rx request.

## 2016-12-23 NOTE — Telephone Encounter (Signed)
Let's increase his trulicity to 1.5 mg weekly. For now, he can take 2 of the 0.75 mg weekly if he still has some left.

## 2016-12-29 ENCOUNTER — Other Ambulatory Visit: Payer: Self-pay | Admitting: Family Medicine

## 2016-12-29 NOTE — Telephone Encounter (Signed)
Last office visit 09/21/2016.  Last refilled 12/21/15 for #180 with 3 refills.  Ok to refill?

## 2017-01-06 ENCOUNTER — Other Ambulatory Visit: Payer: Self-pay

## 2017-01-06 ENCOUNTER — Telehealth: Payer: Self-pay | Admitting: Internal Medicine

## 2017-01-06 MED ORDER — GLUCOSE BLOOD VI STRP: ORAL_STRIP | 12 refills | Status: DC

## 2017-01-06 MED ORDER — INSULIN LISPRO 100 UNIT/ML (KWIKPEN): 20.0000 [IU] | PEN_INJECTOR | Freq: Every day | SUBCUTANEOUS | 2 refills | Status: DC

## 2017-01-06 NOTE — Telephone Encounter (Signed)
Submitted to pharmacy 

## 2017-01-06 NOTE — Telephone Encounter (Signed)
Pt called and needs a prescription for the One Touch Ultra Blue and his Humulog Pens sent to [facility] [address]  He is no longer using [facility].

## 2017-01-11 ENCOUNTER — Other Ambulatory Visit: Payer: Self-pay

## 2017-01-11 MED ORDER — GLUCOSE BLOOD VI STRP: ORAL_STRIP | 5 refills | Status: DC

## 2017-01-16 ENCOUNTER — Other Ambulatory Visit: Payer: Self-pay

## 2017-01-16 MED ORDER — GLUCOSE BLOOD VI STRP: ORAL_STRIP | 5 refills | Status: DC

## 2017-01-17 ENCOUNTER — Other Ambulatory Visit: Payer: Self-pay | Admitting: Family Medicine

## 2017-02-06 ENCOUNTER — Ambulatory Visit (INDEPENDENT_AMBULATORY_CARE_PROVIDER_SITE_OTHER): Payer: PRIVATE HEALTH INSURANCE | Admitting: Internal Medicine

## 2017-02-06 ENCOUNTER — Encounter: Payer: Self-pay | Admitting: Internal Medicine

## 2017-02-06 VITALS — BP 132/88 | HR 71 | Ht 67.0 in | Wt 329.0 lb

## 2017-02-06 DIAGNOSIS — E1165 Type 2 diabetes mellitus with hyperglycemia: Secondary | ICD-10-CM | POA: Diagnosis not present

## 2017-02-06 DIAGNOSIS — E1142 Type 2 diabetes mellitus with diabetic polyneuropathy: Secondary | ICD-10-CM | POA: Diagnosis not present

## 2017-02-06 DIAGNOSIS — IMO0002 Reserved for concepts with insufficient information to code with codable children: Secondary | ICD-10-CM

## 2017-02-06 LAB — POCT GLYCOSYLATED HEMOGLOBIN (HGB A1C): HEMOGLOBIN A1C: 8.8

## 2017-02-06 MED ORDER — INSULIN GLARGINE 100 UNIT/ML SOLOSTAR PEN: 65.0000 [IU] | PEN_INJECTOR | Freq: Every day | SUBCUTANEOUS | 3 refills | Status: DC

## 2017-02-06 NOTE — Patient Instructions (Addendum)
Please increase: - Lantus to 65 units at bedtime   Please continue:  - Humalog 20 units before dinner. - Metformin 1000 mg 2x a daily   - Glipizide 10 mg 2x a day before meals. - Trulicity 1.5 mg under skin weekly   Please read the book: Dr. Lequita Asal  -"Program for Reversing Diabetes" and start a plant-based diet.  Check sugars later in the day, also.  Please return in 3 month with your sugar log.

## 2017-02-06 NOTE — Progress Notes (Signed)
Patient ID: Christian [name], male   DOB: 16-Oct-1951, 66 y.o.   MRN: [id number]  HPI: Christian [name] is a 66 y.o.-year-old male, returning for f/u for DM2, dx 2009, insulin-dependent since 2014, uncontrolled, with complications (Peripheral neuropathy). Last visit 3 mo ago. He is here with his wife who offers part of the hx. [name] + [employer] now.   Despite addition of Trulicity at last visit, he gained 11 lbs.  Last hemoglobin A1c was: Lab Results  Component Value Date   HGBA1C 8.8 11/08/2016   HGBA1C 7.3 08/03/2016   HGBA1C 7.7 05/03/2016   Pt was on a regimen of: - Metformin 1000 mg po bid - Lantus pen 78 units qhs (1 week ago increase from 76) - bedtime He has been on Victoza in the past >> not efficient anymore.  Pt is now on: - Toujeo 60 >> Lantus 50 units at bedtime - Humalog 15 >> 20 units before dinner - started 01/2016  - Metformin 1000 mg 2x a daily - Glipizide 10 mg 2x a day  - Trulicity 1.5 mg under skin weekly - started 11/2016 Stopped Jardiance 25 mg >> but too expensive as he is in the donut hole; had yeast infections. Glipizide and Invokana were started 03/2014. We switched to Jardiance per insurance preference.   Pt checks his sugars 1x a day - only in am: - am: 82, 100-130, 158, 165 >> 90-120 before ran out of Humalog >> 93, 123-265, 283 - 2h after b'fast: n/c >> 157 >> n/c - before lunch: n/c >> 190, 203 >> 189-228 >> n/c - 2h after lunch:  <180, 220 x1, 239 x1 >> n/c >> 185-190 >> n/c - before dinner: n/c >> 107, 156 >> n/c >> 150-160 >> 180s >> n/c - 2h after dinner: n/c - bedtime: 155-190, 200s if forgets glipizide >> 180s, 235 >> 182-260 >> 230s, 318 last night >> n/c - nighttime: n/c No lows. Lowest sugar was 90s >> 105 >> low 100s >> 82 >> 90 >> 93; he has hypoglycemia awareness at 90.  Highest sugar was 318 at night >> 190 >> 200s >> 283  Meter: OneTouch Ultra Mini  Pt's meals are: - Brunch: dry cereal + raisins + no milk; misc. Fruit  (banana/orange); V8 juice - Dinner: usually frozen meal  - Snacks: 2-3 nabs  - no CKD, last BUN/creatinine:  Lab Results  Component Value Date   BUN 15 09/21/2016   CREATININE 0.85 09/21/2016  Not on ACEI. - last set of lipids: Lab Results  Component Value Date   CHOL 167 09/21/2016   HDL 38.50 (L) 09/21/2016   LDLCALC 99 09/21/2016   TRIG 146.0 09/21/2016   CHOLHDL 4 09/21/2016  Not on statins b/c leg cramps. - last eye exam was in 10/2015 - gets the eye exams every year [facility]), but skipped last year. No DR.  - + numbness and tingling in his feet.  He had R TKR in 04/2015. He has OSA.  I reviewed pt's medications, allergies, PMH, social hx, family hx, and changes were documented in the history of present illness. Otherwise, unchanged from my initial visit note.  ROS: Constitutional:+ weight gain, no fatigue, no subjective hyperthermia/hypothermia, no nocturia Eyes: no blurry vision, no xerophthalmia ENT: no sore throat, no nodules palpated in throat, no dysphagia/odynophagia, no hoarseness Cardiovascular: no CP/SOB/palpitations/decreased leg swelling Respiratory: no cough/SOB Gastrointestinal: no N/V/D/C Musculoskeletal: no muscle/+ joint aches (knees) Skin: no rashes Neurological: no tremors/numbness/tingling/dizziness  PE: BP 132/88 (  BP Location: Left Arm, Patient Position: Sitting)   Pulse 71   Ht  (1.702 m)   Wt (!) 329 lb (149.2 kg)   SpO2 96%   BMI 51.53 kg/m   Body mass index is 51.53 kg/m.   Wt Readings from Last 3 Encounters:  02/06/17 (!) 329 lb (149.2 kg)  11/08/16 (!) 318 lb (144.2 kg)  09/21/16 (!) 313 lb 4 oz (142.1 kg)   Constitutional: obese, in NAD Eyes: PERRLA, EOMI, no exophthalmos ENT: moist mucous membranes, no thyromegaly, no cervical lymphadenopathy Cardiovascular: RRR, No MRG, + L>R leg pitting edema Respiratory: CTA B Gastrointestinal: abdomen soft, NT, ND, BS+ Musculoskeletal: no deformities except bilateral flat  feet, strength intact in all 4 Skin: moist, warm, no rashes; Neurological: no tremor with outstretched hands, DTR normal in all 4  ASSESSMENT: 1. DM2, insulin-dependent, uncontrolled, with complications - PN  PLAN:  1. Patient with uncontrolled diabetes, on oral meds + basal insulin, with still high sugars at last visit >> we added Trulicity as in the new year, his wife added him on her insurance. We increased the dose to 1.5 mg weekly in 12/2016. He continued to gain weight despite his addition of Trulicity, with 11 pounds gained in the last 3 months. - At this visit, we again discussed at length about the importance of improving his diet and losing weight. He mentions that one day he ate salad for dinner a sugars were still high in the morning. I advised him that changing only one meal would not help, he needs to do this consistently for several months. We also discussed about the benefits of a plant-based diet, however, he does not appear interested in the diet or concerned enough about his diabetes. He would like to buy the book that I recommended, so hopefully, he can apply even parts of the diet.  - I again advised him to check sugars later in the day, she is still not doing despite multiple promptings. I cannot adjust his Humalog insulin for now, but I do suspect that we may need to add this with every meal at next visit. - For now, I advised him to increase Lantus: Patient Instructions  Please increase: - Lantus to 65 units at bedtime   Please continue:  - Humalog 20 units before dinner. - Metformin 1000 mg 2x a daily   - Glipizide 10 mg 2x a day before meals. - Trulicity 1.5 mg under skin weekly   Please read the book: Dr. Lequita Asal  -"Program for Reversing Diabetes" and start a plant-based diet.  Check sugars later in the day, also.  Please return in 3 month with your sugar log.    - needs a new with eye exam >> did not schedule yet - check HbA1c today >> 8.8% (stable,  high, despite addition of Trulicity) - refused flu shot at last visits - Return to clinic in 3 mo with sugar log  Carlus Pavlov, MD PhD [facility]

## 2017-02-06 NOTE — Addendum Note (Signed)
Addended by: Darene Lamer T on: 02/06/2017 10:57 AM   Modules accepted: Orders

## 2017-04-13 ENCOUNTER — Other Ambulatory Visit: Payer: Self-pay | Admitting: Internal Medicine

## 2017-05-06 ENCOUNTER — Other Ambulatory Visit: Payer: Self-pay | Admitting: Family Medicine

## 2017-05-07 NOTE — Telephone Encounter (Signed)
Last office visit 09/21/16.  Last CPE 12/2015.  Last refilled 12/29/16 for #180 with 1 refill.  Ok to refill?

## 2017-05-25 ENCOUNTER — Ambulatory Visit (INDEPENDENT_AMBULATORY_CARE_PROVIDER_SITE_OTHER): Payer: PRIVATE HEALTH INSURANCE | Admitting: Internal Medicine

## 2017-05-25 ENCOUNTER — Encounter: Payer: Self-pay | Admitting: Internal Medicine

## 2017-05-25 VITALS — BP 128/84 | HR 73 | Wt 320.0 lb

## 2017-05-25 DIAGNOSIS — E1142 Type 2 diabetes mellitus with diabetic polyneuropathy: Secondary | ICD-10-CM

## 2017-05-25 DIAGNOSIS — E1165 Type 2 diabetes mellitus with hyperglycemia: Secondary | ICD-10-CM

## 2017-05-25 DIAGNOSIS — E1140 Type 2 diabetes mellitus with diabetic neuropathy, unspecified: Secondary | ICD-10-CM | POA: Insufficient documentation

## 2017-05-25 DIAGNOSIS — G63 Polyneuropathy in diseases classified elsewhere: Secondary | ICD-10-CM | POA: Diagnosis not present

## 2017-05-25 DIAGNOSIS — IMO0002 Reserved for concepts with insufficient information to code with codable children: Secondary | ICD-10-CM

## 2017-05-25 LAB — POCT GLYCOSYLATED HEMOGLOBIN (HGB A1C): HEMOGLOBIN A1C: 8.4

## 2017-05-25 MED ORDER — INSULIN LISPRO 100 UNIT/ML (KWIKPEN): 20.0000 [IU] | PEN_INJECTOR | Freq: Three times a day (TID) | SUBCUTANEOUS | 2 refills | Status: DC

## 2017-05-25 NOTE — Patient Instructions (Addendum)
Please continue: - Lantus 65 units at bedtime  - Metformin 1000 mg 2x a daily   - Glipizide 10 mg 2x a day before meals. - Trulicity 1.5 mg under skin weekly   Please add Humalog 15-20 units before the other meals.  Please read the book: Dr. Lequita AsalNeal Barnard  -"Program for Reversing Diabetes" and start a plant-based diet.  Please return in 3 month with your sugar log.

## 2017-05-25 NOTE — Progress Notes (Signed)
Patient ID: Christian [name], male   DOB: 12-12-1950, 66 y.o.   MRN: [id number]  HPI: Christian [name] is a 66 y.o.-year-old male, returning for f/u for DM2, dx 2009, insulin-dependent since 2014, uncontrolled, with complications (Peripheral neuropathy). Last visit 3 mo ago.Marland Kitchen He is here with his wife who offers part of the hx. [name] + [employer] now.   Last hemoglobin A1c was: Lab Results  Component Value Date   HGBA1C 8.8 02/06/2017   HGBA1C 8.8 11/08/2016   HGBA1C 7.3 08/03/2016   Pt is now on: - Toujeo 60 >> Lantus 50 >> 65 units at bedtime - Humalog 15 >> 20 units before dinner - started 01/2016  - Metformin 1000 mg 2x a daily - Glipizide 10 mg 2x a day  - Trulicity 1.5 mg under skin weekly - started 11/2016 Stopped Jardiance 25 mg >> but too expensive as he is in the donut hole; had yeast infections. Glipizide and Invokana were started 03/2014. We switched to Jardiance per insurance preference.  He has been on Victoza in the past >> not efficient anymore.  Pt checks his sugars 1x a day - forgot log: - am: 90-120 before ran out of Humalog >> 93, 123-265, 283 >> 132-160s, 190, 200s - 2h after b'fast: n/c >> 157 >> n/c - before lunch: n/c >> 190, 203 >> 189-228 >> n/c - 2h after lunch:  <180, 220 x1, 239 x1 >> n/c >> 185-190 >> n/c >> 200s - before dinner: n/c >> 107, 156 >> n/c >> 150-160 >> 180s >> n/c >> 200s - 2h after dinner: n/c - bedtime: > 180s, 235 >> 182-260 >> 230s, 318 last night >> n/c - nighttime: n/c No lows. Lowest sugar was 93 >> 132; he has hypoglycemia awareness at 90.  Highest sugar was 283 >> 200s  Meter: OneTouch Ultra Mini  Pt's meals are: - Brunch: dry cereal + raisins + no milk; misc. Fruit (banana/orange); V8 juice - Dinner: usually frozen meal  - Snacks: 2-3 nabs  - No CKD, last BUN/creatinine:  Lab Results  Component Value Date   BUN 15 09/21/2016   CREATININE 0.85 09/21/2016  Not on ACEI - last set of lipids: Lab Results  Component Value  Date   CHOL 167 09/21/2016   HDL 38.50 (L) 09/21/2016   LDLCALC 99 09/21/2016   TRIG 146.0 09/21/2016   CHOLHDL 4 09/21/2016  No statins b/c leg cramps. - last eye exam was in 10/2015. No DR.  - he has numbness and tingling in his feet.  He had R TKR in 04/2015. He has OSA.  ROS: Constitutional: + weight loss, no fatigue, no subjective hyperthermia, no subjective hypothermia Eyes: no blurry vision, no xerophthalmia ENT: no sore throat, no nodules palpated in throat, no dysphagia, no odynophagia, no hoarseness Cardiovascular: no CP/no SOB/no palpitations/no leg swelling Respiratory: no cough/no SOB/no wheezing Gastrointestinal: no N/no V/no D/no C/no acid reflux Musculoskeletal: no muscle aches/no joint aches Skin: no rashes, no hair loss Neurological: no tremors/no dizziness  I reviewed pt's medications, allergies, PMH, social hx, family hx, and changes were documented in the history of present illness. Otherwise, unchanged from my initial visit note.  PE: BP 128/84 (BP Location: Left Arm, Patient Position: Sitting)   Pulse 73   Wt (!) 320 lb (145.2 kg)   SpO2 94%   BMI 50.12 kg/m  Body mass index is 50.12 kg/m.   Wt Readings from Last 3 Encounters:  05/25/17 (!) 320 lb (145.2 kg)  02/06/17 Marland Kitchen)  329 lb (149.2 kg)  11/08/16 (!) 318 lb (144.2 kg)   Constitutional: overweight, in NAD Eyes: PERRLA, EOMI, no exophthalmos ENT: moist mucous membranes, no thyromegaly, no cervical lymphadenopathy Cardiovascular: RRR, No MRG, + B leg swelling Respiratory: CTA B Gastrointestinal: abdomen soft, NT, ND, BS+ Musculoskeletal: no deformities, strength intact in all 4 Skin: moist, warm, no rashes Neurological: no tremor with outstretched hands, DTR normal in all 4   ASSESSMENT: 1. DM2, insulin-dependent, uncontrolled, with complications - PN - stable, worse in L foot  PLAN:  1. Patient with uncontrolled diabetes, on oral meds + basal insulin and pre-dinner insulin, with still  high sugars. We will need to intensify his insulin regimen: adding Humalog before all meals. He also absolutely needs to improve his diet >> discussed about this. - For now, I advised him to increase Humalog: Patient Instructions  Please continue: - Lantus 65 units at bedtime  - Metformin 1000 mg 2x a daily   - Glipizide 10 mg 2x a day before meals. - Trulicity 1.5 mg under skin weekly   Please add Humalog 15-20 units before the other meals.  Please read the book: Dr. Lequita AsalNeal Barnard  -"Program for Reversing Diabetes" and start a plant-based diet.  Please return in 3 month with your sugar log.   - today, HbA1c is 8.4% (slightly better) - continue checking sugars at different times of the day - check 2x a day, rotating checks - advised for yearly eye exams >> he needs one >> scheduled - Return to clinic in 3 mo with sugar log   2. PN - worse in L foot  - suggested a podiatrist for basic care   Carlus Pavlovristina Phyliss Hulick, MD PhD [facility]

## 2017-05-25 NOTE — Addendum Note (Signed)
Addended by: Darene LamerHOMPSON, Tashonda Pinkus T on: 05/25/2017 10:36 AM   Modules accepted: Orders

## 2017-06-06 DIAGNOSIS — E119 Type 2 diabetes mellitus without complications: Secondary | ICD-10-CM | POA: Diagnosis not present

## 2017-06-09 ENCOUNTER — Telehealth: Payer: Self-pay | Admitting: Family Medicine

## 2017-06-09 NOTE — Telephone Encounter (Signed)
Left pt message asking to call Allison back directly at [phone] to schedule AWV + labs with Lesia and CPE with PCP. °  °*NOTE* Last AWV 12/21/15 °

## 2017-06-24 IMAGING — CR DG KNEE 1-2V PORT*R*
1 series · 2 of 2 positions shown · non-contrast
Comparison: None.

CLINICAL DATA: Status post right knee replacement

EXAM:
PORTABLE RIGHT KNEE - 1-2 VIEW

[Series 1: AP · U · 2 of 2 slices shown]
[im 1/2]
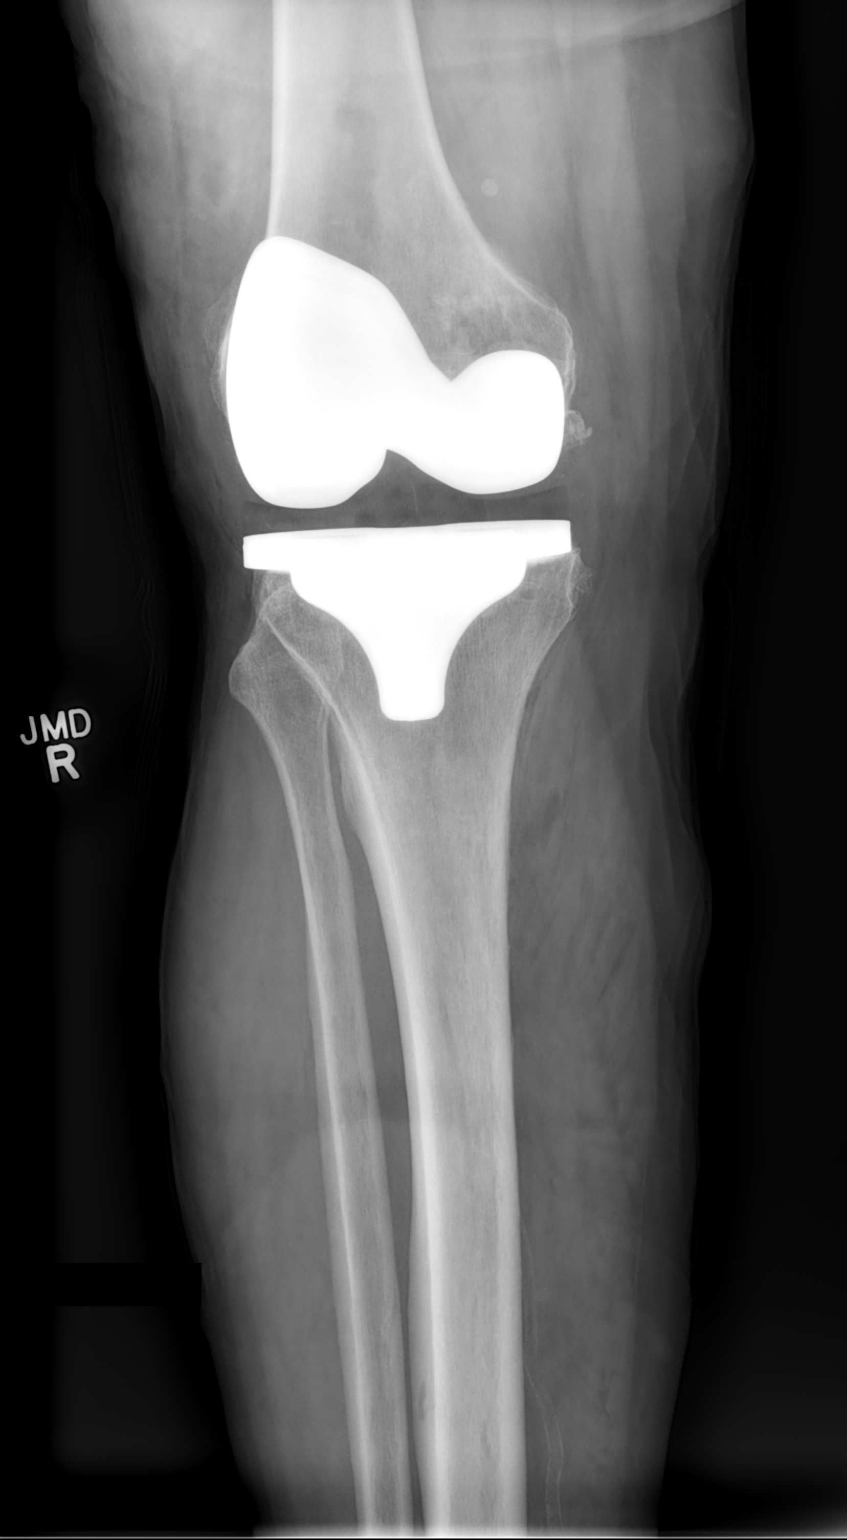
[im 2/2]
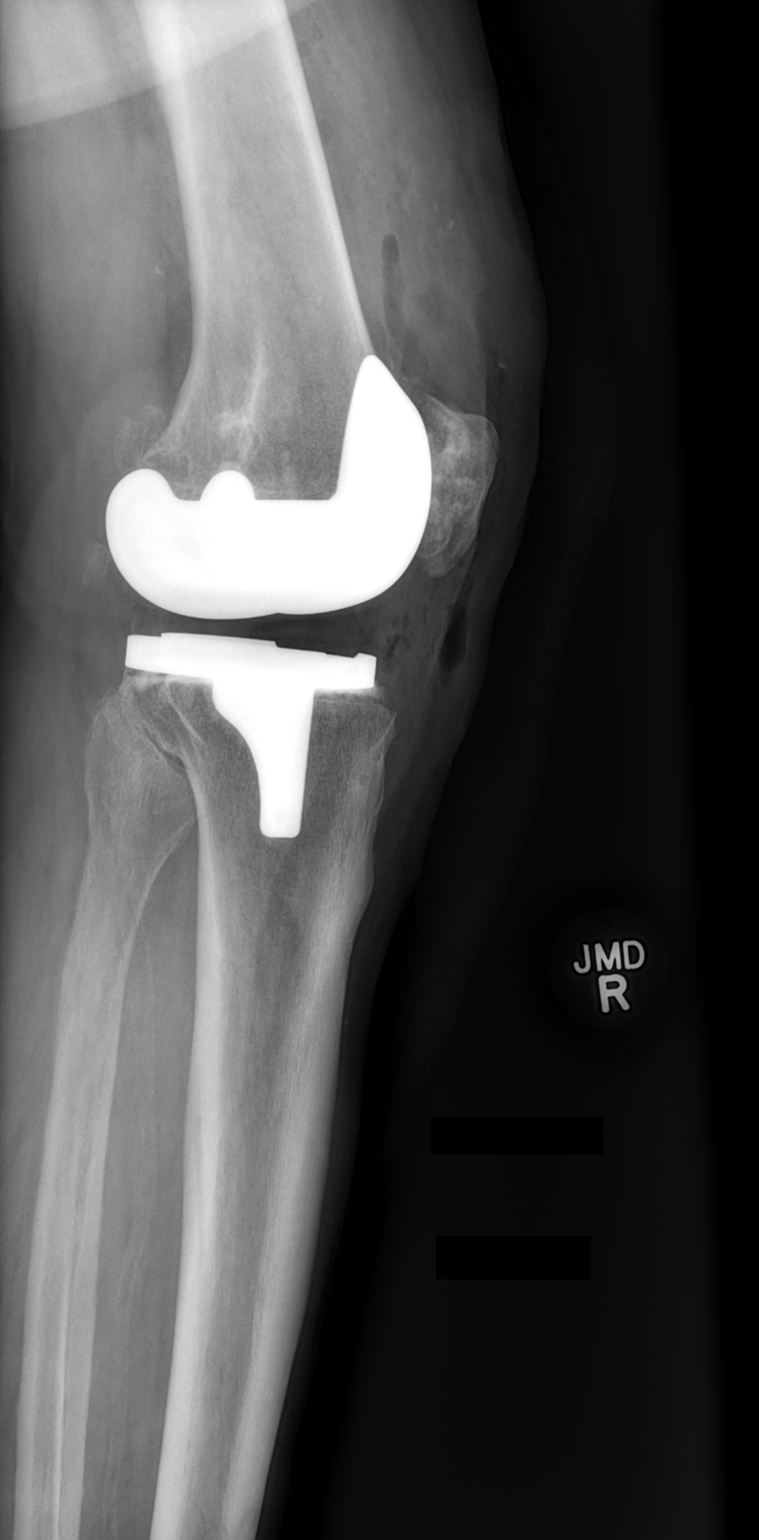

[2 of 2 positions shown; findings below may reference images not displayed]

FINDINGS: A right knee prosthesis is now seen. Air is noted within the
surgical bed. No acute bony or soft tissue abnormality is noted.
IMPRESSION: Postsurgical changes without acute abnormality.

## 2017-07-18 NOTE — Telephone Encounter (Signed)
Left pt message asking to call Revonda Standardllison back directly at [phone] to schedule AWV + labs with Virl AxeLesia and CPE with PCP.  *NOTE* Last AWV 12/21/15

## 2017-08-04 ENCOUNTER — Other Ambulatory Visit: Payer: Self-pay | Admitting: Internal Medicine

## 2017-08-07 ENCOUNTER — Other Ambulatory Visit: Payer: Self-pay | Admitting: *Deleted

## 2017-08-07 MED ORDER — BISOPROLOL-HYDROCHLOROTHIAZIDE 10-6.25 MG PO TABS: 1.0000 | ORAL_TABLET | Freq: Every day | ORAL | 0 refills | Status: DC

## 2017-08-07 NOTE — Telephone Encounter (Signed)
Last office visit 09/21/2016.  No future appointments Last refilled 05/08/17 for #180 with no refills.

## 2017-08-08 MED ORDER — DICLOFENAC SODIUM 75 MG PO TBEC: 75.0000 mg | DELAYED_RELEASE_TABLET | Freq: Two times a day (BID) | ORAL | 0 refills | Status: DC

## 2017-08-08 NOTE — Telephone Encounter (Signed)
OK to refill 90 day supply, 0 ref  Needs f/u CPX later in fall

## 2017-08-11 ENCOUNTER — Telehealth: Payer: Self-pay | Admitting: *Deleted

## 2017-08-11 NOTE — Telephone Encounter (Signed)
Christian [name] called and states that he needs a refill of his Humalog Christian [name] states he takes Humalog 100 unit/ ml but he wants to know if he can get a RX for Humalog 200 unit/ ml . He states he can get a savings card for the 200 and not for the 100. He is wondering if Dr. Elvera Lennox will write him a new RX for it. Christian [name] is requesting a call back. [phone].

## 2017-08-11 NOTE — Telephone Encounter (Signed)
Yes, Let's send the U200 Humalog.

## 2017-08-11 NOTE — Telephone Encounter (Signed)
Please advise, Thank you.

## 2017-08-14 ENCOUNTER — Other Ambulatory Visit: Payer: Self-pay

## 2017-08-14 MED ORDER — INSULIN LISPRO 200 UNIT/ML ~~LOC~~ SOPN: 20.0000 [IU] | PEN_INJECTOR | Freq: Every day | SUBCUTANEOUS | 1 refills | Status: DC

## 2017-08-14 NOTE — Telephone Encounter (Signed)
Submitted in the U500.

## 2017-08-24 ENCOUNTER — Encounter: Payer: Self-pay | Admitting: Internal Medicine

## 2017-08-24 ENCOUNTER — Ambulatory Visit (INDEPENDENT_AMBULATORY_CARE_PROVIDER_SITE_OTHER): Payer: PRIVATE HEALTH INSURANCE | Admitting: Internal Medicine

## 2017-08-24 VITALS — BP 138/90 | HR 72 | Wt 326.0 lb

## 2017-08-24 DIAGNOSIS — E1142 Type 2 diabetes mellitus with diabetic polyneuropathy: Secondary | ICD-10-CM

## 2017-08-24 DIAGNOSIS — E1165 Type 2 diabetes mellitus with hyperglycemia: Secondary | ICD-10-CM

## 2017-08-24 DIAGNOSIS — IMO0002 Reserved for concepts with insufficient information to code with codable children: Secondary | ICD-10-CM

## 2017-08-24 LAB — POCT GLYCOSYLATED HEMOGLOBIN (HGB A1C): HEMOGLOBIN A1C: 9

## 2017-08-24 MED ORDER — INSULIN GLARGINE 100 UNIT/ML SOLOSTAR PEN: PEN_INJECTOR | SUBCUTANEOUS | 3 refills | Status: DC

## 2017-08-24 NOTE — Progress Notes (Signed)
Patient ID: Elba BarmanGeorge E Akens, male   DOB: 10/27/1951, 66 y.o.   MRN: [id number]  HPI Elba BarmanGeorge E Ferg is a 66 y.o.-year-old male, returning for f/u for DM2, dx 2009, insulin-dependent since 2014, uncontrolled, with complications (Peripheral neuropathy). Last visit 3 mo ago. [name] + [employer].   Last hemoglobin A1c was: Lab Results  Component Value Date   HGBA1C 8.4 05/25/2017   HGBA1C 8.8 02/06/2017   HGBA1C 8.8 11/08/2016   Pt is on: - Toujeo 60 >> Lantus 50 >> 65 units at bedtime - Humalog 15-20 units before dinner - started 01/2016 >> we increased to 3x a day in 05/2017 (but still only using this once a day, with dinner) - will start U200 - Metformin 1000 mg 2x a daily - Glipizide 10 mg 2x a day  - Trulicity 1.5 mg under skin weekly - started 11/2016 Stopped Jardiance 25 mg >> but too expensive - in the donut hole; had yeast infections. Glipizide and Invokana were started 03/2014. We switched to Jardiance per insurance preference.  He has been on Victoza in the past >> not efficient anymore.  Pt checks his sugars 1x a day: - am: 93, 123-265, 283 >> 132-160s, 190, 200s >> 160-180s, 240 - 2h after b'fast: n/c >> 157 >> n/c - before lunch: n/c >> 190, 203 >> 189-228 >> n/c - 2h after lunch:  n/c >> 185-190 >> n/c >> 200s >> 230-235 - before dinner: 150-160 >> 180s >> n/c >> 200s >> n/c - 2h after dinner: n/c - bedtime:  230s, 318 last night >> n/c >> n/c - nighttime: n/c Lowest sugar was 93 >> 132 >> 80s-90s; he has hypoglycemia awareness at 90.  Highest sugar was 283 >> 200s >> 300s.  Meter: OneTouch Ultra Mini  Pt's meals are: - Brunch: dry cereal + raisins + no milk; misc. Fruit (banana/orange); V8 juice - Dinner: usually frozen meal  - Snacks: 2-3 nabs  - No CKD, last BUN/creatinine:  Lab Results  Component Value Date   BUN 15 09/21/2016   CREATININE 0.85 09/21/2016  Not on ACEI.  Last ACR: Lab Results  Component Value Date   MICRALBCREAT 0.8 08/20/2015   MICRALBCREAT 0.3 09/11/2013   Last GFR: Lab Results  Component Value Date   GFRNONAA >60 05/02/2015   GFRNONAA >60 04/27/2015   - last set of lipids: Lab Results  Component Value Date   CHOL 167 09/21/2016   HDL 38.50 (L) 09/21/2016   LDLCALC 99 09/21/2016   TRIG 146.0 09/21/2016   CHOLHDL 4 09/21/2016  Not on statins b/c leg cramps. - last eye exam was in 07/2017 >> No DR  - + numbness and tingling in his feet.  He had R TKR in 04/2015. He has OSA.  ROS: Constitutional: no weight gain/no weight loss, no fatigue, no subjective hyperthermia, no subjective hypothermia Eyes: no blurry vision, no xerophthalmia ENT: no sore throat, no nodules palpated in throat, no dysphagia, no odynophagia, no hoarseness Cardiovascular: no CP/no SOB/no palpitations/no leg swelling Respiratory: no cough/no SOB/no wheezing Gastrointestinal: no N/no V/no D/no C/no acid reflux Musculoskeletal: no muscle aches/no joint aches Skin: no rashes, no hair loss Neurological: no tremors/+ numbness/+ tingling/no dizziness  I reviewed pt's medications, allergies, PMH, social hx, family hx, and changes were documented in the history of present illness. Otherwise, unchanged from my initial visit note.  PE: BP 138/90 (BP Location: Left Arm, Patient Position: Sitting, Cuff Size: Normal)   Pulse 72   Wt (!) 326 lb (  147.9 kg)   SpO2 98%   BMI 51.06 kg/m  Body mass index is 51.06 kg/m.   Wt Readings from Last 3 Encounters:  08/24/17 (!) 326 lb (147.9 kg)  05/25/17 (!) 320 lb (145.2 kg)  02/06/17 (!) 329 lb (149.2 kg)   Constitutional: overweight, in NAD Eyes: PERRLA, EOMI, no exophthalmos ENT: moist mucous membranes, no thyromegaly, no cervical lymphadenopathy Cardiovascular: RRR, No MRG, + B leg swelling Respiratory: CTA B Gastrointestinal: abdomen soft, NT, ND, BS+ Musculoskeletal: no deformities, strength intact in all 4 Skin: moist, warm, no rashes Neurological: no tremor with outstretched hands,  DTR normal in all 4  ASSESSMENT: 1. DM2, insulin-dependent, uncontrolled, with complications - PN - stable, worse in L foot  2. PN  PLAN:  1. Patient with uncontrolled diabetes, on oral medication and basal insulin + predinner insulin, with higher sugars at this visit compared to last. At last visit, I advised him to take Humalog before every meal, but he did not increase the frequency. He is occasionally eating lunch and taking Humalog for lunch, but he is not covering his breakfast. I advised him to start doing so, as sugars are higher later in the day - However, since his sugars are high through out the day, will also increase the Lantus by adding a lower dose in the morning - Ultimately, he absolutely needs to change his diet and start exercise. He did buy a tricycle and lasts to start using it - For now, I advised him to: Patient Instructions  Please increase: - Lantus 20 units in am and 65 units at bedtime - Humalog to 15-20 units before each of the 3 meals  Continue: - Metformin 1000 mg 2x a daily - Glipizide 10 mg 2x a day  - Trulicity 1.5 mg under skin weekly  Please come back for a follow-up appointment in 3 months.    - today, HbA1c is 9% (higher) - continue checking sugars at different times of the day - check 3x a day, rotating checks - advised for yearly eye exams >> he is UTD - refuses flu shot - Return to clinic in 3 mo with sugar log    2. PN - worse in L foot - at last visit I suggested a podiatrist for his foot care - Will likely be improved by physical activity and improvement in his blood sugars  Carlus Pavlov, MD PhD [facility] Endocrinology

## 2017-08-24 NOTE — Patient Instructions (Addendum)
Please increase: - Lantus 20 units in am and 65 units at bedtime - Humalog to 15-20 units before each of the 3 meals  Continue: - Metformin 1000 mg 2x a daily - Glipizide 10 mg 2x a day  - Trulicity 1.5 mg under skin weekly  Please come back for a follow-up appointment in 3 months.

## 2017-08-24 NOTE — Addendum Note (Signed)
Addended by: Carola RhineKIGOTHO, NANCY N on: 08/24/2017 01:27 PM   Modules accepted: Orders

## 2017-08-31 ENCOUNTER — Ambulatory Visit (INDEPENDENT_AMBULATORY_CARE_PROVIDER_SITE_OTHER): Payer: PRIVATE HEALTH INSURANCE | Admitting: Family Medicine

## 2017-08-31 ENCOUNTER — Ambulatory Visit (INDEPENDENT_AMBULATORY_CARE_PROVIDER_SITE_OTHER): Payer: PRIVATE HEALTH INSURANCE

## 2017-08-31 VITALS — BP 120/78 | HR 69 | Temp 97.8°F | Ht 66.25 in | Wt 328.5 lb

## 2017-08-31 DIAGNOSIS — E1142 Type 2 diabetes mellitus with diabetic polyneuropathy: Secondary | ICD-10-CM

## 2017-08-31 DIAGNOSIS — Z0000 Encounter for general adult medical examination without abnormal findings: Secondary | ICD-10-CM

## 2017-08-31 DIAGNOSIS — E1165 Type 2 diabetes mellitus with hyperglycemia: Secondary | ICD-10-CM

## 2017-08-31 DIAGNOSIS — E785 Hyperlipidemia, unspecified: Secondary | ICD-10-CM

## 2017-08-31 DIAGNOSIS — Z6841 Body Mass Index (BMI) 40.0 and over, adult: Secondary | ICD-10-CM | POA: Diagnosis not present

## 2017-08-31 DIAGNOSIS — IMO0002 Reserved for concepts with insufficient information to code with codable children: Secondary | ICD-10-CM

## 2017-08-31 DIAGNOSIS — E1169 Type 2 diabetes mellitus with other specified complication: Secondary | ICD-10-CM | POA: Diagnosis not present

## 2017-08-31 DIAGNOSIS — Z125 Encounter for screening for malignant neoplasm of prostate: Secondary | ICD-10-CM | POA: Diagnosis not present

## 2017-08-31 DIAGNOSIS — Z1159 Encounter for screening for other viral diseases: Secondary | ICD-10-CM

## 2017-08-31 DIAGNOSIS — I10 Essential (primary) hypertension: Secondary | ICD-10-CM | POA: Diagnosis not present

## 2017-08-31 LAB — CBC WITH DIFFERENTIAL/PLATELET
BASOS PCT: 0.8 % (ref 0.0–3.0)
Basophils Absolute: 0.1 10*3/uL (ref 0.0–0.1)
EOS PCT: 4 % (ref 0.0–5.0)
Eosinophils Absolute: 0.4 10*3/uL (ref 0.0–0.7)
HEMATOCRIT: 39.8 % (ref 39.0–52.0)
Hemoglobin: 12.8 g/dL — ABNORMAL LOW (ref 13.0–17.0)
LYMPHS PCT: 21.2 % (ref 12.0–46.0)
Lymphs Abs: 2.3 10*3/uL (ref 0.7–4.0)
MCHC: 32.2 g/dL (ref 30.0–36.0)
MCV: 98.1 fl (ref 78.0–100.0)
MONOS PCT: 7.3 % (ref 3.0–12.0)
Monocytes Absolute: 0.8 10*3/uL (ref 0.1–1.0)
NEUTROS ABS: 7.1 10*3/uL (ref 1.4–7.7)
Neutrophils Relative %: 66.7 % (ref 43.0–77.0)
PLATELETS: 225 10*3/uL (ref 150.0–400.0)
RBC: 4.05 Mil/uL — ABNORMAL LOW (ref 4.22–5.81)
RDW: 14.2 % (ref 11.5–15.5)
WBC: 10.7 10*3/uL — ABNORMAL HIGH (ref 4.0–10.5)

## 2017-08-31 LAB — BASIC METABOLIC PANEL
BUN: 17 mg/dL (ref 6–23)
CHLORIDE: 102 meq/L (ref 96–112)
CO2: 30 mEq/L (ref 19–32)
CREATININE: 0.79 mg/dL (ref 0.40–1.50)
Calcium: 9.6 mg/dL (ref 8.4–10.5)
GFR: 104.02 mL/min (ref >60.00)
Glucose, Bld: 277 mg/dL — ABNORMAL HIGH (ref 70–99)
Potassium: 5 mEq/L (ref 3.5–5.1)
Sodium: 138 mEq/L (ref 135–145)

## 2017-08-31 LAB — MICROALBUMIN / CREATININE URINE RATIO
CREATININE, U: 61 mg/dL
MICROALB/CREAT RATIO: 1.1 mg/g (ref 0.0–30.0)

## 2017-08-31 LAB — HEPATIC FUNCTION PANEL
ALT: 21 U/L (ref 0–53)
AST: 20 U/L (ref 0–37)
Albumin: 3.9 g/dL (ref 3.5–5.2)
Alkaline Phosphatase: 64 U/L (ref 39–117)
BILIRUBIN TOTAL: 0.5 mg/dL (ref 0.2–1.2)
Bilirubin, Direct: 0.1 mg/dL (ref 0.0–0.3)
Total Protein: 7 g/dL (ref 6.0–8.3)

## 2017-08-31 LAB — LIPID PANEL
Cholesterol: 146 mg/dL (ref 0–200)
HDL: 34.3 mg/dL — AB (ref >39.00)
LDL Cholesterol: 84 mg/dL (ref 0–99)
NonHDL: 111.57
TRIGLYCERIDES: 137 mg/dL (ref 0.0–149.0)
Total CHOL/HDL Ratio: 4
VLDL: 27.4 mg/dL (ref 0.0–40.0)

## 2017-08-31 LAB — PSA, MEDICARE: PSA: 0.81 ng/ml (ref 0.10–4.00)

## 2017-08-31 NOTE — Progress Notes (Signed)
Subjective:   Christian [name] is a 66 y.o. male who presents for Medicare Annual/Subsequent preventive examination.  Review of Systems:  N/A Cardiac Risk Factors include: advanced age (>89men, >7 women);diabetes mellitus;hypertension;male gender;obesity (BMI >30kg/m2)     Objective:    Vitals: BP 120/78 (BP Location: Right Arm, Patient Position: Sitting, Cuff Size: Large)   Pulse 69   Temp 97.8 F (36.6 C) (Oral)   Ht 5' 6.25" (1.683 m) Comment: no shoes  Wt (!) 328 lb 8 oz (149 kg)   SpO2 96%   BMI 52.62 kg/m   Body mass index is 52.62 kg/m.  Tobacco History  Smoking Status  . Former Smoker  . Quit date: 03/08/2004  Smokeless Tobacco  . Never Used     Counseling given: No   Past Medical History:  Diagnosis Date  . Chronic back pain 09/13/2013  . Chronic pain syndrome 09/13/2013  . GERD (gastroesophageal reflux disease)    hx. of  . Gout 09/13/2013  . Hemorrhoids   . Hypertension   . Morbid obesity with body mass index of 45.0-49.9 in adult [facility]) 09/13/2013  . Osteoarthritis 09/13/2013  . Pneumonia    hx. of  . Seizures (HCC)   . Sleep apnea    has never used C-pap machine due to insurance cost  . Type 1 diabetes mellitus, uncontrolled (HCC)    Past Surgical History:  Procedure Laterality Date  . broken toe 35 years ago    . TOTAL KNEE ARTHROPLASTY Right 05/01/2015   Procedure: RIGHT TOTAL KNEE ARTHROPLASTY;  Surgeon: Kathryne Hitch, MD;  Location: [facility];  Service: Orthopedics;  Laterality: Right;  . WISDOM TOOTH EXTRACTION     Family History  Problem Relation Age of Onset  . Alcohol abuse Father   . Arthritis Maternal Grandmother   . Alcohol abuse Paternal Grandmother   . Alcohol abuse Paternal Grandfather   . Diabetes Maternal Aunt    History  Sexual Activity  . Sexual activity: Yes  . Partners: Female    Outpatient Encounter Prescriptions as of 08/31/2017  Medication Sig  . aspirin 81 MG tablet Take 81 mg by mouth once.  .  bisoprolol-hydrochlorothiazide (ZIAC) 10-6.25 MG tablet Take 1 tablet by mouth daily.  . diclofenac (VOLTAREN) 75 MG EC tablet Take 1 tablet (75 mg total) by mouth 2 (two) times daily.  Marland Kitchen glipiZIDE (GLUCOTROL) 10 MG tablet TAKE 1 TABLET BY MOUTH  TWICE A DAY BEFORE MEALS  . glucose blood (ONE TOUCH TEST STRIPS) test strip Use as instructed two times daily; dx- E11.42, E11.65  . Insulin Glargine (LANTUS SOLOSTAR) 100 UNIT/ML Solostar Pen Inject 20 units in am and 65 units at night under skin  . Insulin Lispro (HUMALOG KWIKPEN) 200 UNIT/ML SOPN Inject 20 Units into the skin daily before supper. (Patient taking differently: Inject 20 Units into the skin 3 (three) times daily before meals. )  . Insulin Pen Needle (NOVOTWIST) 32G X 5 MM MISC USE 2x a day  . metFORMIN (GLUCOPHAGE) 1000 MG tablet TAKE 1 TABLET BY MOUTH  TWICE A DAY WITH MEALS  . NOVOTWIST 32G X 5 MM MISC USE AS DIRECTED  . oxyCODONE-acetaminophen (PERCOCET/ROXICET) 5-325 MG tablet Take 1 tablet by mouth 2 (two) times daily as needed for severe pain. Reported on 05/03/2016  . TRULICITY 1.5 MG/0.5ML SOPN INJECT 1.5 MG ONCE WEEKLY.   No facility-administered encounter medications on file as of 08/31/2017.     Activities of Daily Living In your present state  of health, do you have any difficulty performing the following activities: 08/31/2017  Hearing? Y  Vision? N  Difficulty concentrating or making decisions? N  Walking or climbing stairs? Y  Dressing or bathing? N  Doing errands, shopping? N  Preparing Food and eating ? N  Using the Toilet? N  In the past six months, have you accidently leaked urine? N  Do you have problems with loss of bowel control? N  Managing your Medications? N  Managing your Finances? N  Housekeeping or managing your Housekeeping? N  Some recent data might be hidden    Patient Care Team: Hannah Beatopland, Spencer, MD as PCP - General (Family Medicine)   Assessment:     Hearing Screening   125Hz  250Hz  500Hz   1000Hz  2000Hz  3000Hz  4000Hz  6000Hz  8000Hz   Right ear:   40 0 0  0    Left ear:   40 40 0  0    Vision Screening Comments: Last vision exam in 2018 with Dr. Theron AristaPeter Dunn/[facility]   Exercise Activities and Dietary recommendations Current Exercise Habits: The patient does not participate in regular exercise at present, Exercise limited by: orthopedic condition(s)  Goals    . Increase physical activity          Starting 08/31/2017, I will continue to ride my bicycle for at least 20 min daily as weather permits.       Fall Risk Fall Risk  08/31/2017 12/21/2015  Falls in the past year? No No   Depression Screen PHQ 2/9 Scores 08/31/2017 12/21/2015  PHQ - 2 Score 0 0  PHQ- 9 Score 0 -    Cognitive Function MMSE - Mini Mental State Exam 08/31/2017  Orientation to time 5  Orientation to Place 5  Registration 3  Attention/ Calculation 0  Recall 3  Language- name 2 objects 0  Language- repeat 1  Language- follow 3 step command 3  Language- read & follow direction 0  Write a sentence 0  Copy design 0  Total score 20     PLEASE NOTE: A Mini-Cog screen was completed. Maximum score is 20. A value of 0 denotes this part of Folstein MMSE was not completed or the patient failed this part of the Mini-Cog screening.   Mini-Cog Screening Orientation to Time - Max 5 pts Orientation to Place - Max 5 pts Registration - Max 3 pts Recall - Max 3 pts Language Repeat - Max 1 pts Language Follow 3 Step Command - Max 3 pts   Immunization History  Administered Date(s) Administered  . PPD Test 05/06/2015   Screening Tests Health Maintenance  Topic Date Due  . INFLUENZA VACCINE  02/04/2018 (Originally 06/07/2017)  . FOOT EXAM  08/31/2018 (Originally 08/19/2016)  . TETANUS/TDAP  12/21/2020 (Originally 10/12/1969)  . PNA vac Low Risk Adult (1 of 2 - PCV13) 12/21/2020 (Originally 10/13/2015)  . HEMOGLOBIN A1C  02/22/2018  . OPHTHALMOLOGY EXAM  06/05/2018  . URINE MICROALBUMIN  08/31/2018    . COLONOSCOPY  01/16/2020  . Hepatitis C Screening  Completed      Plan:     I have personally reviewed, addressed, and noted the following in the patient's chart:  A. Medical and social history B. Use of alcohol, tobacco or illicit drugs  C. Current medications and supplements D. Functional ability and status E.  Nutritional status F.  Physical activity G. Advance directives H. List of other physicians I.  Hospitalizations, surgeries, and ER visits in previous 12 months J.  Vitals  K. Screenings to include hearing, vision, cognitive, depression L. Referrals and appointments - none  In addition, I have reviewed and discussed with patient certain preventive protocols, quality metrics, and best practice recommendations. A written personalized care plan for preventive services as well as general preventive health recommendations were provided to patient.  See attached scanned questionnaire for additional information.   Signed,   Randa Evens, MHA, BS, LPN Health Coach

## 2017-08-31 NOTE — Progress Notes (Signed)
Dr. Karleen Hampshire T. Leea Rambeau, MD, CAQ Sports Medicine [facility] [address] [place], [address] Phone: [phone] Fax: [phone]  08/31/2017  Patient: Christian [name], MRN: [id number], DOB: 07/04/1951, 66 y.o.  Primary Physician:  Hannah Beat, MD   F/u OV  Subjective:   Christian [name] is a 66 y.o. very pleasant male patient who presents with the following:  F/u after medicare wellness.   HTN: Tolerating all medications without side effects Stable and at goal No CP, no sob. No HA.  BP Readings from Last 3 Encounters:  08/31/17 120/78  08/31/17 120/78  08/24/17 138/90    Basic Metabolic Panel:    Component Value Date/Time   NA 138 08/31/2017 1445   K 5.0 08/31/2017 1445   CL 102 08/31/2017 1445   CO2 30 08/31/2017 1445   BUN 17 08/31/2017 1445   CREATININE 0.79 08/31/2017 1445   GLUCOSE 277 (H) 08/31/2017 1445   CALCIUM 9.6 08/31/2017 1445    Lipids: Doing well, stable. Tolerating meds fine with no SE. Panel reviewed with patient.  Lipids:    Component Value Date/Time   CHOL 146 08/31/2017 1445   TRIG 137.0 08/31/2017 1445   HDL 34.30 (L) 08/31/2017 1445   VLDL 27.4 08/31/2017 1445   CHOLHDL 4 08/31/2017 1445    Lab Results  Component Value Date   ALT 21 08/31/2017   AST 20 08/31/2017   ALKPHOS 64 08/31/2017   BILITOT 0.5 08/31/2017    Body mass index is 52.62 kg/m.   Has been going to [place] with wife 2 days a week for 9 months.   Cough: Ar gerd    Past Medical History, Surgical History, Social History, Family History, Problem List, Medications, and Allergies have been reviewed and updated if relevant.  Patient Active Problem List   Diagnosis Date Noted  . Peripheral neuropathy 05/25/2017  . Uncontrolled type 2 diabetes mellitus with peripheral neuropathy (HCC) 01/27/2016  . 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 Benign paroxysmal positional vertigo 12/09/2013  . Eustachian tube  dysfunction 12/09/2013  . Gout 09/13/2013  . Chronic pain syndrome 09/13/2013  . Osteoarthritis 09/13/2013  . Chronic back pain 09/13/2013  . Morbid obesity with body mass index of 45.0-49.9 in adult [facility]) 09/13/2013  . Seizures (HCC)   . Hypertension     Past Medical History:  Diagnosis Date  . Chronic back pain 09/13/2013  . Chronic pain syndrome 09/13/2013  . GERD (gastroesophageal reflux disease)    hx. of  . Gout 09/13/2013  . Hemorrhoids   . Hypertension   . Morbid obesity with body mass index of 45.0-49.9 in adult [facility]) 09/13/2013  . Osteoarthritis 09/13/2013  . Pneumonia    hx. of  . Seizures (HCC)   . Sleep apnea    has never used C-pap machine due to insurance cost  . Type 1 diabetes mellitus, uncontrolled (HCC)     Past Surgical History:  Procedure Laterality Date  . broken toe 35 years ago    . TOTAL KNEE ARTHROPLASTY Right 05/01/2015   Procedure: RIGHT TOTAL KNEE ARTHROPLASTY;  Surgeon: Kathryne Hitch, MD;  Location: [facility];  Service: Orthopedics;  Laterality: Right;  . WISDOM TOOTH EXTRACTION      Social History   Social History  . Marital status: Married    Spouse name: N/A  . Number of children: N/A  . Years of education: N/A   Occupational History  . Not on file.  Social History Main Topics  . Smoking status: Former Smoker    Quit date: 03/08/2004  . Smokeless tobacco: Never Used  . Alcohol use No     Comment: rare  . Drug use: No  . Sexual activity: Yes    Partners: Female   Other Topics Concern  . 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 Alcohol abuse Father   . Arthritis Maternal Grandmother   . Alcohol abuse Paternal Grandmother   . Alcohol abuse Paternal Grandfather   . Diabetes Maternal Aunt     Allergies  Allergen Reactions  . Penicillins     REACTION: swelling, rash    Medication list reviewed and updated in full in [facility].   GEN: No acute illnesses, no fevers, chills. GI: No n/v/d, eating normally Pulm: No SOB Interactive and getting along well at home.  Otherwise, ROS is as per the HPI.  Objective:   BP 120/78 (BP Location: Right Arm, Patient Position: Sitting, Cuff Size: Large)   Pulse 69   Temp 97.8 F (36.6 C) (Oral)   Ht 5' 6.25" (1.683 m) Comment: no shoes  Wt (!) 328 lb 8 oz (149 kg)   SpO2 96%   BMI 52.62 kg/m   GEN: WDWN, NAD, Non-toxic, A & O x 3 HEENT: Atraumatic, Normocephalic. Neck supple. No masses, No LAD. Ears and Nose: No external deformity. CV: RRR, No M/G/R. No JVD. No thrill. No extra heart sounds. PULM: CTA B, no wheezes, crackles, rhonchi. No retractions. No resp. distress. No accessory muscle use. EXTR: No c/c/e NEURO Normal gait.  PSYCH: Normally interactive. Conversant. Not depressed or anxious appearing.  Calm demeanor.   Laboratory and Imaging Data: Results for orders placed or performed in visit on 08/31/17  Hepatitis C antibody  Result Value Ref Range   Hepatitis C Ab NON-REACTIVE NON-REACTI   SIGNAL TO CUT-OFF 0.03 <1.00  Lipid Panel  Result Value Ref Range   Cholesterol 146 0 - 200 mg/dL   Triglycerides 696.2137.0 0.0 - 149.0 mg/dL   HDL 95.2834.30 (L) >41.32>39.00 mg/dL   VLDL 44.027.4 0.0 - 10.240.0 mg/dL   LDL Cholesterol 84 0 - 99 mg/dL   Total CHOL/HDL Ratio 4    NonHDL 111.57   PSA, Medicare  Result Value Ref Range   PSA 0.81 0.10 - 4.00 ng/ml  CBC with Differential/Platelet  Result Value Ref Range   WBC 10.7 (H) 4.0 - 10.5 K/uL   RBC 4.05 (L) 4.22 - 5.81 Mil/uL   Hemoglobin 12.8 (L) 13.0 - 17.0 g/dL   HCT 72.539.8 36.639.0 - 44.052.0 %   MCV 98.1 78.0 - 100.0 fl   MCHC 32.2 30.0 - 36.0 g/dL   RDW 34.714.2 42.511.5 - 95.615.5 %   Platelets 225.0 150.0 - 400.0 K/uL   Neutrophils Relative % 66.7 43.0 - 77.0 %   Lymphocytes Relative 21.2 12.0 - 46.0 %   Monocytes Relative 7.3 3.0 - 12.0 %   Eosinophils Relative 4.0 0.0 - 5.0 %   Basophils Relative 0.8 0.0 - 3.0 %   Neutro  Abs 7.1 1.4 - 7.7 K/uL   Lymphs Abs 2.3 0.7 - 4.0 K/uL   Monocytes Absolute 0.8 0.1 - 1.0 K/uL   Eosinophils Absolute 0.4 0.0 - 0.7 K/uL   Basophils Absolute 0.1 0.0 - 0.1 K/uL  Basic Metabolic Panel  Result Value Ref  Range   Sodium 138 135 - 145 mEq/L   Potassium 5.0 3.5 - 5.1 mEq/L   Chloride 102 96 - 112 mEq/L   CO2 30 19 - 32 mEq/L   Glucose, Bld 277 (H) 70 - 99 mg/dL   BUN 17 6 - 23 mg/dL   Creatinine, Ser 0.98 0.40 - 1.50 mg/dL   Calcium 9.6 8.4 - 11.9 mg/dL   GFR 147.82 >95.62 mL/min  Hepatic Function Panel  Result Value Ref Range   Total Bilirubin 0.5 0.2 - 1.2 mg/dL   Bilirubin, Direct 0.1 0.0 - 0.3 mg/dL   Alkaline Phosphatase 64 39 - 117 U/L   AST 20 0 - 37 U/L   ALT 21 0 - 53 U/L   Total Protein 7.0 6.0 - 8.3 g/dL   Albumin 3.9 3.5 - 5.2 g/dL  Microalbumin / creatinine urine ratio  Result Value Ref Range   Microalb, Ur <0.7 0.0 - 1.9 mg/dL   Creatinine,U 13.0 mg/dL   Microalb Creat Ratio 1.1 0.0 - 30.0 mg/g     Assessment and Plan:   Essential hypertension  Morbid obesity with body mass index (BMI) of 50.0 to 59.9 in adult [facility])  Hyperlipidemia associated with type 2 diabetes mellitus (HCC)  Uncontrolled type 2 diabetes mellitus with peripheral neuropathy (HCC)  Blood pressure has been stable, lipids are doing okay, continues to be greater than 51 BMI.  Blood sugars have improved compared to the last few years.  Follow-up: 1 yr  Future Appointments Date Time Provider Department Center  11/23/2017 11:00 AM Carlus Pavlov, MD [facility] None   Signed,  Elpidio Galea. Madison Albea, MD   Allergies as of 08/31/2017      Reactions   Penicillins    REACTION: swelling, rash      Medication List       Accurate as of 08/31/17 11:59 PM. Always use your most recent med list.          aspirin 81 MG tablet Take 81 mg by mouth once.   bisoprolol-hydrochlorothiazide 10-6.25 MG tablet Commonly known as:  ZIAC Take 1 tablet by mouth daily.   diclofenac  75 MG EC tablet Commonly known as:  VOLTAREN Take 1 tablet (75 mg total) by mouth 2 (two) times daily.   glipiZIDE 10 MG tablet Commonly known as:  GLUCOTROL TAKE 1 TABLET BY MOUTH  TWICE A DAY BEFORE MEALS   glucose blood test strip Commonly known as:  ONE TOUCH TEST STRIPS Use as instructed two times daily; dx- E11.42, E11.65   Insulin Glargine 100 UNIT/ML Solostar Pen Commonly known as:  LANTUS SOLOSTAR Inject 20 units in am and 65 units at night under skin   Insulin Lispro 200 UNIT/ML Sopn Commonly known as:  HUMALOG KWIKPEN Inject 20 Units into the skin daily before supper.   metFORMIN 1000 MG tablet Commonly known as:  GLUCOPHAGE TAKE 1 TABLET BY MOUTH  TWICE A DAY WITH MEALS   NOVOTWIST 32G X 5 MM Misc Generic drug:  Insulin Pen Needle USE AS DIRECTED   Insulin Pen Needle 32G X 5 MM Misc Commonly known as:  NOVOTWIST USE 2x a day   oxyCODONE-acetaminophen 5-325 MG tablet Commonly known as:  PERCOCET/ROXICET Take 1 tablet by mouth 2 (two) times daily as needed for severe pain. Reported on 05/03/2016   TRULICITY 1.5 MG/0.5ML Sopn Generic drug:  Dulaglutide INJECT 1.5 MG ONCE WEEKLY.

## 2017-08-31 NOTE — Patient Instructions (Signed)
Christian [name] , Thank you for taking time to come for your Medicare Wellness Visit. I appreciate your ongoing commitment to your health goals. Please review the following plan we discussed and let me know if I can assist you in the future.   These are the goals we discussed: Goals    . Increase physical activity          Starting 08/31/2017, I will continue to ride my bicycle for at least 20 min daily as weather permits.        This is a list of the screening recommended for you and due dates:  Health Maintenance  Topic Date Due  . Flu Shot  02/04/2018*  . Complete foot exam   08/31/2018*  . Tetanus Vaccine  12/21/2020*  . Pneumonia vaccines (1 of 2 - PCV13) 12/21/2020*  . Hemoglobin A1C  02/22/2018  . Eye exam for diabetics  06/05/2018  . Urine Protein Check  08/31/2018  . Colon Cancer Screening  01/16/2020  .  Hepatitis C: One time screening is recommended by Center for Disease Control  (CDC) for  adults born from [birth year] through 1966.   Completed  *Topic was postponed. The date shown is not the original due date.   Preventive Care for Adults  A healthy lifestyle and preventive care can promote health and wellness. Preventive health guidelines for adults include the following key practices.  . A routine yearly physical is a good way to check with your health care provider about your health and preventive screening. It is a chance to share any concerns and updates on your health and to receive a thorough exam.  . Visit your dentist for a routine exam and preventive care every 6 months. Brush your teeth twice a day and floss once a day. Good oral hygiene prevents tooth decay and gum disease.  . The frequency of eye exams is based on your age, health, family medical history, use  of contact lenses, and other factors. Follow your health care provider's recommendations for frequency of eye exams.  . Eat a healthy diet. Foods like vegetables, fruits, whole grains, low-fat dairy  products, and lean protein foods contain the nutrients you need without too many calories. Decrease your intake of foods high in solid fats, added sugars, and salt. Eat the right amount of calories for you. Get information about a proper diet from your health care provider, if necessary.  . Regular physical exercise is one of the most important things you can do for your health. Most adults should get at least 150 minutes of moderate-intensity exercise (any activity that increases your heart rate and causes you to sweat) each week. In addition, most adults need muscle-strengthening exercises on 2 or more days a week.  Silver Sneakers may be a benefit available to you. To determine eligibility, you may visit the website: www.silversneakers.com or contact program at 61756199791-203-668-6908 Mon-Fri between 8AM-8PM.   . Maintain a healthy weight. The body mass index (BMI) is a screening tool to identify possible weight problems. It provides an estimate of body fat based on height and weight. Your health care provider can find your BMI and can help you achieve or maintain a healthy weight.   For adults 20 years and older: ? A BMI below 18.5 is considered underweight. ? A BMI of 18.5 to 24.9 is normal. ? A BMI of 25 to 29.9 is considered overweight. ? A BMI of 30 and above is considered obese.   . Maintain normal  blood lipids and cholesterol levels by exercising and minimizing your intake of saturated fat. Eat a balanced diet with plenty of fruit and vegetables. Blood tests for lipids and cholesterol should begin at age 63 and be repeated every 5 years. If your lipid or cholesterol levels are high, you are over 50, or you are at high risk for heart disease, you may need your cholesterol levels checked more frequently. Ongoing high lipid and cholesterol levels should be treated with medicines if diet and exercise are not working.  . If you smoke, find out from your health care provider how to quit. If you do not  use tobacco, please do not start.  . If you choose to drink alcohol, please do not consume more than 2 drinks per day. One drink is considered to be 12 ounces (355 mL) of beer, 5 ounces (148 mL) of wine, or 1.5 ounces (44 mL) of liquor.  . If you are 69-32 years old, ask your health care provider if you should take aspirin to prevent strokes.  . Use sunscreen. Apply sunscreen liberally and repeatedly throughout the day. You should seek shade when your shadow is shorter than you. Protect yourself by wearing long sleeves, pants, a wide-brimmed hat, and sunglasses year round, whenever you are outdoors.  . Once a month, do a whole body skin exam, using a mirror to look at the skin on your back. Tell your health care provider of new moles, moles that have irregular borders, moles that are larger than a pencil eraser, or moles that have changed in shape or color.

## 2017-08-31 NOTE — Progress Notes (Signed)
I reviewed health advisor's note, was available for consultation, and agree with documentation and plan.   Signed,  Jaline Pincock T. Faithlynn Deeley, MD  

## 2017-08-31 NOTE — Progress Notes (Signed)
Pre visit review using our clinic review tool, if applicable. No additional management support is needed unless otherwise documented below in the visit note. 

## 2017-08-31 NOTE — Addendum Note (Signed)
Addended by: Alvina ChouWALSH, TERRI J on: 08/31/2017 03:26 PM   Modules accepted: Orders

## 2017-08-31 NOTE — Progress Notes (Signed)
PCP notes:   Health maintenance:  Foot exam - PCP please address at next appt Flu vaccine - pt declined Eye exam - per pt, exam within previous 90 days Microalbumin - completed Hep C screening - completed  Abnormal screenings:   Hearing - failed  Hearing Screening   125Hz  250Hz  500Hz  1000Hz  2000Hz  3000Hz  4000Hz  6000Hz  8000Hz   Right ear:   40 0 0  0    Left ear:   40 40 0  0     Patient concerns:   Urinary frequency - does not occur every night Cough - intermittent cough; usually in AM  Nurse concerns:  None  Next PCP appt:   08/31/17 @ 1445

## 2017-09-01 ENCOUNTER — Encounter: Payer: Self-pay | Admitting: *Deleted

## 2017-09-01 LAB — HEPATITIS C ANTIBODY
Hepatitis C Ab: NONREACTIVE
SIGNAL TO CUT-OFF: 0.03 (ref <1.00)

## 2017-09-03 ENCOUNTER — Encounter: Payer: Self-pay | Admitting: Family Medicine

## 2017-09-03 DIAGNOSIS — E785 Hyperlipidemia, unspecified: Secondary | ICD-10-CM

## 2017-09-03 DIAGNOSIS — E1169 Type 2 diabetes mellitus with other specified complication: Secondary | ICD-10-CM | POA: Insufficient documentation

## 2017-09-19 ENCOUNTER — Telehealth: Payer: Self-pay

## 2017-09-19 ENCOUNTER — Other Ambulatory Visit: Payer: Self-pay

## 2017-09-19 MED ORDER — INSULIN PEN NEEDLE 32G X 5 MM MISC: 5 refills | Status: DC

## 2017-09-19 NOTE — Telephone Encounter (Signed)
Submitted

## 2017-09-19 NOTE — Telephone Encounter (Signed)
Patient called to say he needs a new prescription for pen needles- old one reads 2 times daily but he is using 5 times daily so he has run out- patient requesting a new prescription with 5 times daily on it

## 2017-09-21 ENCOUNTER — Other Ambulatory Visit: Payer: Self-pay

## 2017-09-21 ENCOUNTER — Telehealth: Payer: Self-pay | Admitting: Internal Medicine

## 2017-09-21 MED ORDER — INSULIN PEN NEEDLE 32G X 5 MM MISC: 5 refills | Status: DC

## 2017-09-21 NOTE — Telephone Encounter (Signed)
[facility] is calling to follow up on previous call. Patient is waiting at pharmacy for prescription for needles

## 2017-09-21 NOTE — Telephone Encounter (Signed)
RE: Prescription for needles.Patient is at pharmacy-co-pay is $120 (Patient cannot afford) Needs to be coded properly with a form that pharmacy is going to fax to you-needles will be covered by Medicare. Dr. Laury AxonNeeds to sign form and form needs to be faxed back to pharmacy asap. Patient only has a couple of needles. [facility] [address] [facility]

## 2017-09-21 NOTE — Telephone Encounter (Signed)
Called pharmacy, they advised of what they needed. I refaxed rx to pharmacy.

## 2017-11-19 ENCOUNTER — Other Ambulatory Visit: Payer: Self-pay | Admitting: Family Medicine

## 2017-11-23 ENCOUNTER — Other Ambulatory Visit: Payer: Self-pay | Admitting: Internal Medicine

## 2017-11-23 ENCOUNTER — Ambulatory Visit (INDEPENDENT_AMBULATORY_CARE_PROVIDER_SITE_OTHER): Payer: PRIVATE HEALTH INSURANCE | Admitting: Internal Medicine

## 2017-11-23 ENCOUNTER — Encounter: Payer: Self-pay | Admitting: Internal Medicine

## 2017-11-23 VITALS — BP 138/80 | HR 73 | Ht 66.25 in | Wt 324.6 lb

## 2017-11-23 DIAGNOSIS — IMO0002 Reserved for concepts with insufficient information to code with codable children: Secondary | ICD-10-CM

## 2017-11-23 DIAGNOSIS — E1142 Type 2 diabetes mellitus with diabetic polyneuropathy: Secondary | ICD-10-CM | POA: Diagnosis not present

## 2017-11-23 DIAGNOSIS — G63 Polyneuropathy in diseases classified elsewhere: Secondary | ICD-10-CM | POA: Diagnosis not present

## 2017-11-23 DIAGNOSIS — E1165 Type 2 diabetes mellitus with hyperglycemia: Secondary | ICD-10-CM | POA: Diagnosis not present

## 2017-11-23 DIAGNOSIS — Z6841 Body Mass Index (BMI) 40.0 and over, adult: Secondary | ICD-10-CM | POA: Diagnosis not present

## 2017-11-23 LAB — POCT GLYCOSYLATED HEMOGLOBIN (HGB A1C): HEMOGLOBIN A1C: 9.1

## 2017-11-23 NOTE — Progress Notes (Signed)
Patient ID: Christian [name], male   DOB: 05/06/1951, 67 y.o.   MRN: [id number]  HPI Christian [name] is a 67 y.o.-year-old male, returning for f/u for DM2, dx 2009, insulin-dependent since 2014, uncontrolled, with complications (Peripheral neuropathy). Last visit 3 months ago. [name] + [employer].   Last hemoglobin A1c was: 08/24/2017: HbA1c 9% Lab Results  Component Value Date   HGBA1C 8.4 05/25/2017   HGBA1C 8.8 02/06/2017   HGBA1C 8.8 11/08/2016   Pt is on: - Lantus 20 units in a.m. and 65 units at bedtime - Humalog 15-20 units >> 2 meals a day - Metformin 1000 mg 2x a daily - Glipizide 10 mg 2x a day  - Trulicity 1.5 mg under skin weekly  -started 11/2016 Stopped Jardiance 25 mg >> but too expensive - in the donut hole; had yeast infections. Glipizide and Invokana were started 03/2014. We switched to Jardiance per insurance preference.  He has been on Victoza in the past >> not efficient anymore.  Pt checks his sugars once a day: - am:132-160s, 190, 200s >> 160-180s, 240 >> 108, 140-170, 302 (Holidays: 300s) - 2h after b'fast: n/c >> 157 >> n/c - before lunch:  190, 203 >> 189-228 >> n/c  >> 220-230 - 2h after lunch:   n/c >> 200s >> 230-235 >> 220-230 - before dinner: 180s >> n/c >> 200s >> n/c - 2h after dinner: n/c - bedtime:  230s, 318 last night >> n/c  - nighttime: n/c Lowest sugar was 80s >> 108; he has hypoglycemia awareness at 90.  Highest sugar was 300s >> 300s.  Meter: OneTouch Ultra Mini  Pt's meals are: - Brunch: dry cereal + raisins + no milk; misc. Fruit (banana/orange); V8 juice - Dinner: usually frozen meal  - Snacks: 2-3 nabs  - No CKD, last BUN/creatinine:  Lab Results  Component Value Date   BUN 17 08/31/2017   CREATININE 0.79 08/31/2017  Not on ACE inhibitors/ARB's.  Last ACR: Lab Results  Component Value Date   MICRALBCREAT 1.1 08/31/2017   MICRALBCREAT 0.8 08/20/2015   MICRALBCREAT 0.3 09/11/2013   Last GFR: Lab Results  Component  Value Date   GFRNONAA >60 05/02/2015   GFRNONAA >60 04/27/2015   - + HL; last set of lipids: Lab Results  Component Value Date   CHOL 146 08/31/2017   HDL 34.30 (L) 08/31/2017   LDLCALC 84 08/31/2017   TRIG 137.0 08/31/2017   CHOLHDL 4 08/31/2017  Not on statins because of leg cramps. - last eye exam was in  07/2017: No DR  -+ Numbness and tingling in his feet.  He had R TKR in 04/2015. He has OSA.  ROS: Constitutional: no weight gain/no weight loss, no fatigue, no subjective hyperthermia, no subjective hypothermia Eyes: no blurry vision, no xerophthalmia ENT: no sore throat, no nodules palpated in throat, no dysphagia, no odynophagia, no hoarseness Cardiovascular: no CP/no SOB/no palpitations/no leg swelling Respiratory: no cough/no SOB/no wheezing Gastrointestinal: no N/no V/no D/no C/no acid reflux Musculoskeletal: no muscle aches/no joint aches Skin: no rashes, no hair loss Neurological: no tremors/+ numbness/+ tingling/no dizziness  I reviewed pt's medications, allergies, PMH, social hx, family hx, and changes were documented in the history of present illness. Otherwise, unchanged from my initial visit note.  PE: BP 138/80   Pulse 73   Ht 5' 6.25" (1.683 m)   Wt (!) 324 lb 9.6 oz (147.2 kg)   SpO2 97%   BMI 52.00 kg/m  Body mass index is 52  kg/m.   Wt Readings from Last 3 Encounters:  11/23/17 (!) 324 lb 9.6 oz (147.2 kg)  08/31/17 (!) 328 lb 8 oz (149 kg)  08/31/17 (!) 328 lb 8 oz (149 kg)   Constitutional: overweight, in NAD Eyes: PERRLA, EOMI, no exophthalmos ENT: moist mucous membranes, no thyromegaly, no cervical lymphadenopathy Cardiovascular: RRR, No MRG, + bilateral leg swelling Respiratory: CTA B Gastrointestinal: abdomen soft, NT, ND, BS+ Musculoskeletal: no deformities, strength intact in all 4 Skin: moist, warm, no rashes Neurological: no tremor with outstretched hands, DTR normal in all 4  ASSESSMENT: 1. DM2, insulin-dependent, uncontrolled,  with complications - PN - stable, worse in L foot  2. PN  3.  Obesity  PLAN:  1. Patient with uncontrolled diabetes type 2, on oral antidiabetic medication, GLP-1 receptor agonist, and basal-bolus insulin regimen.  At last visit, we increased the Humalog to be taken with every meal as sugars were higher in a stepwise fashion throughout the day. We also increase Lantus by adding a low dose in the morning.  We also discussed about the need to change his diet and start exercise.  He just by a tricycle and started to use it. - sugars are high, especially before lunch as he does not take Humalog with b'fast >> advised to start taking this - OTW, will not change regimen as he mentions sugars are better lately - discussed exercising inside as the weather is not always allowing him to exercise outside - I advised him to: Patient Instructions  Please continue: - Lantus 20 units in a.m. and 65 units at bedtime - Humalog 15-20 units but increase each of the 3 meals - Metformin 1000 mg 2x a daily - Glipizide 10 mg 2x a day  - Trulicity 1.5 mg under skin weekly  Please come back for a follow-up appointment in 3 months.   - today, HbA1c is 9.1% (slightly higher) - continue checking sugars at different times of the day - check 3x a day, rotating checks - advised for yearly eye exams >> he is UTD - Return to clinic in 3 mo with sugar log     2. PN - Worse in left foot - At last visits I suggested a podiatrist for his foot care  3.  Obesity - Continues on Trulicity which helps with both diabetes and weight gain - He absolutely needs to start changing his diet.  Also, he needs to stay more active.  We discussed again about this today.  Carlus Pavlov, MD PhD [facility]

## 2017-11-23 NOTE — Patient Instructions (Addendum)
Please continue: - Lantus 20 units in a.m. and 65 units at bedtime - Humalog 15-20 units but increase each of the 3 meals - Metformin 1000 mg 2x a daily - Glipizide 10 mg 2x a day  - Trulicity 1.5 mg under skin weekly  Please come back for a follow-up appointment in 3 months.

## 2017-11-23 NOTE — Addendum Note (Signed)
Addended by: Yolande JollyLAWSON, Labaron Digirolamo on: 11/23/2017 11:25 AM   Modules accepted: Orders

## 2017-12-11 ENCOUNTER — Encounter: Payer: Self-pay | Admitting: Family Medicine

## 2017-12-11 ENCOUNTER — Ambulatory Visit (INDEPENDENT_AMBULATORY_CARE_PROVIDER_SITE_OTHER): Payer: PRIVATE HEALTH INSURANCE | Admitting: Family Medicine

## 2017-12-11 ENCOUNTER — Other Ambulatory Visit: Payer: Self-pay

## 2017-12-11 VITALS — BP 118/70 | HR 70 | Temp 97.9°F | Ht 66.25 in | Wt 331.2 lb

## 2017-12-11 DIAGNOSIS — I878 Other specified disorders of veins: Secondary | ICD-10-CM | POA: Diagnosis not present

## 2017-12-11 DIAGNOSIS — M7989 Other specified soft tissue disorders: Secondary | ICD-10-CM

## 2017-12-11 DIAGNOSIS — Z6841 Body Mass Index (BMI) 40.0 and over, adult: Secondary | ICD-10-CM | POA: Diagnosis not present

## 2017-12-11 NOTE — Progress Notes (Signed)
Dr. Karleen HampshireSpencer T. Isabelly Kobler, MD, CAQ Sports Medicine [facility] and Sports Medicine [address] [place], [id number] Phone: [phone] Fax: [phone]  12/11/2017  Patient: Christian [name], MRN: [id number], DOB: 10/13/1951, 67 y.o.  Primary Physician:  Hannah Beatopland, Donte Lenzo, MD   Chief Complaint  Patient presents with  . Foot Swelling    Were Blue on Thursday night   Subjective:   Christian [name] is a 67 y.o. very pleasant male patient who presents with the following:  Feet are numb and tingling some.   Swelling has always been there on L - R is worse.   The patient was concerned and had more swelling that is typical, more visible on the left side, particular on Thursday night.  They are looking a little bit better compared to before, but he also has had some bluish tent, and he does have some known varicose veins throughout most of his lower extremities.  They are not particularly painful right now.  He does have some numbness and tingling, and he also has diabetes  Past Medical History, Surgical History, Social History, Family History, Problem List, Medications, and Allergies have been reviewed and updated if relevant.  Patient Active Problem List   Diagnosis Date Noted  . Uncontrolled type 2 diabetes mellitus with peripheral neuropathy (HCC) 01/27/2016    Priority: High  . Hyperlipidemia associated with type 2 diabetes mellitus (HCC) 09/03/2017    Priority: Medium  . Morbid obesity with body mass index (BMI) of 50.0 to 59.9 in adult [facility](HCC) 09/13/2013    Priority: Medium  . Hypertension     Priority: Medium  . Peripheral neuropathy 05/25/2017  . 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 Eustachian tube dysfunction 12/09/2013  . Gout 09/13/2013  . Chronic pain syndrome 09/13/2013  . Osteoarthritis 09/13/2013  . Chronic back pain 09/13/2013  . Seizures (HCC)     Past Medical History:  Diagnosis Date  . Chronic back pain 09/13/2013  . Chronic pain  syndrome 09/13/2013  . GERD (gastroesophageal reflux disease)    hx. of  . Gout 09/13/2013  . Hemorrhoids   . Hypertension   . Morbid obesity with body mass index of 45.0-49.9 in adult [facility](HCC) 09/13/2013  . Osteoarthritis 09/13/2013  . Pneumonia    hx. of  . Seizures (HCC)   . Sleep apnea    has never used C-pap machine due to insurance cost  . Type 1 diabetes mellitus, uncontrolled (HCC)     Past Surgical History:  Procedure Laterality Date  . broken toe 35 years ago    . TOTAL KNEE ARTHROPLASTY Right 05/01/2015   Procedure: RIGHT TOTAL KNEE ARTHROPLASTY;  Surgeon: Kathryne Hitchhristopher Y Blackman, MD;  Location: [facility];  Service: Orthopedics;  Laterality: Right;  . WISDOM TOOTH EXTRACTION      Social History   Socioeconomic History  . Marital status: Married    Spouse name: Not on file  . Number of children: Not on file  . Years of education: Not on file  . Highest education level: Not on file  Social Needs  . Financial resource strain: Not on file  . Food insecurity - worry: Not on file  . Food insecurity - inability: Not on file  . Transportation needs - medical: Not on file  . Transportation needs - non-medical: Not on file  Occupational History  . Not on file  Tobacco Use  . Smoking status: Former Smoker    Last attempt to quit: 03/08/2004  Years since quitting: 13.7  . Smokeless tobacco: Never Used  Substance and Sexual Activity  . Alcohol use: No    Comment: rare  . Drug use: No  . Sexual activity: Yes    Partners: Female  Other Topics Concern  . Not on file  Social History Narrative   Psychiatric nurse for [employer]   Married    Family History  Problem Relation Age of Onset  . Alcohol abuse Father   . Arthritis Maternal Grandmother   . Alcohol abuse Paternal Grandmother   . Alcohol abuse Paternal Grandfather   . Diabetes Maternal Aunt     Allergies  Allergen Reactions  . Penicillins     REACTION: swelling, rash    Medication list reviewed and updated in  full in [facility].   GEN: No acute illnesses, no fevers, chills. GI: No n/v/d, eating normally Pulm: No SOB Interactive and getting along well at home.  Otherwise, ROS is as per the HPI.  Objective:   BP 118/70   Pulse 70   Temp 97.9 F (36.6 C) (Oral)   Ht 5' 6.25" (1.683 m)   Wt (!) 331 lb 4 oz (150.3 kg)   BMI 53.06 kg/m   GEN: WDWN, NAD, Non-toxic, A & O x 3 HEENT: Atraumatic, Normocephalic. Neck supple. No masses, No LAD. Ears and Nose: No external deformity. CV: RRR, No M/G/R. No JVD. No thrill. No extra heart sounds. PULM: CTA B, no wheezes, crackles, rhonchi. No retractions. No resp. distress. No accessory muscle use. EXTR: No c/c/ tr le edema NEURO Normal gait.  PSYCH: Normally interactive. Conversant. Not depressed or anxious appearing.  Calm demeanor.   Pulses 2+ LE  Laboratory and Imaging Data:  Assessment and Plan:   Venous stasis of both lower extremities  Bilateral swelling of feet  Morbid obesity with body mass index (BMI) of 50.0 to 59.9 in adult [facility])  I reassured him and reviewed venous stasis disease.   Follow-up: No Follow-up on file.  Signed,  Elpidio Galea. Yocelyn Brocious, MD   Allergies as of 12/11/2017      Reactions   Penicillins    REACTION: swelling, rash      Medication List        Accurate as of 12/11/17 11:59 PM. Always use your most recent med list.          aspirin 81 MG tablet Take 81 mg by mouth once.   bisoprolol-hydrochlorothiazide 10-6.25 MG tablet Commonly known as:  ZIAC Take 1 tablet by mouth daily.   diclofenac 75 MG EC tablet Commonly known as:  VOLTAREN TAKE 1 TABLET BY MOUTH TWO  TIMES DAILY   glipiZIDE 10 MG tablet Commonly known as:  GLUCOTROL TAKE 1 TABLET BY MOUTH  TWICE A DAY BEFORE MEALS   glucose blood test strip Commonly known as:  ONE TOUCH TEST STRIPS Use as instructed two times daily; dx- E11.42, E11.65   Insulin Glargine 100 UNIT/ML Solostar Pen Commonly known as:  LANTUS  SOLOSTAR Inject 20 units in am and 65 units at night under skin   Insulin Lispro 200 UNIT/ML Sopn Commonly known as:  HUMALOG KWIKPEN Inject 20 Units into the skin daily before supper.   Insulin Pen Needle 32G X 5 MM Misc Commonly known as:  NOVOTWIST USE 2x a day   Insulin Pen Needle 32G X 5 MM Misc Commonly known as:  NOVOTWIST USE AS DIRECTED 5 times daily with insulin injections, insulin dependent, E11.65   metFORMIN 1000  MG tablet Commonly known as:  GLUCOPHAGE TAKE 1 TABLET BY MOUTH  TWICE A DAY WITH MEALS   oxyCODONE-acetaminophen 5-325 MG tablet Commonly known as:  PERCOCET/ROXICET Take 1 tablet by mouth 2 (two) times daily as needed for severe pain. Reported on 05/03/2016   TRULICITY 1.5 MG/0.5ML Sopn Generic drug:  Dulaglutide INJECT 1.5 MG ONCE WEEKLY.

## 2017-12-20 ENCOUNTER — Other Ambulatory Visit: Payer: Self-pay | Admitting: Family Medicine

## 2018-01-11 ENCOUNTER — Telehealth: Payer: Self-pay | Admitting: Internal Medicine

## 2018-01-11 ENCOUNTER — Other Ambulatory Visit: Payer: Self-pay | Admitting: Internal Medicine

## 2018-01-11 NOTE — Telephone Encounter (Signed)
Patient needs an RX for Humalog 2 (90 day supply) sent immediately to [facility] [address]. Patient is out of medication. Patient tried to get his refill 2 weeks ago-the pharmacy said it was too early (that script was for 45 day but he asked for a 90 day supply-now he is out of medication). Dr. Elvera LennoxGherghe has always written the above med for a 90 day supply. Patient is at the pharmacy now. If questions please call patient at ph# [phone]

## 2018-01-11 NOTE — Telephone Encounter (Signed)
Called patient to inform Rx sent to pharmacy as requested. No answer.

## 2018-01-16 ENCOUNTER — Encounter: Payer: Self-pay | Admitting: Internal Medicine

## 2018-01-16 ENCOUNTER — Ambulatory Visit (INDEPENDENT_AMBULATORY_CARE_PROVIDER_SITE_OTHER): Payer: PRIVATE HEALTH INSURANCE | Admitting: Internal Medicine

## 2018-01-16 VITALS — BP 126/80 | HR 73 | Temp 98.3°F | Resp 14 | Wt 333.0 lb

## 2018-01-16 DIAGNOSIS — J209 Acute bronchitis, unspecified: Secondary | ICD-10-CM | POA: Insufficient documentation

## 2018-01-16 MED ORDER — PREDNISONE 20 MG PO TABS: 40.0000 mg | ORAL_TABLET | Freq: Every day | ORAL | 0 refills | Status: DC

## 2018-01-16 NOTE — Progress Notes (Signed)
Subjective:    Patient ID: Christian [name], male    DOB: 09/22/1951, 67 y.o.   MRN: [id number]  HPI Here due to cough  Started a few days ago Some trouble breathing--- gets "bad rales" with a deep breath Very noisy breathing and trouble lying flat No fever Cough is dry--but mostly if he is lying down No night sweats or chills  No headache but mild congestion (better with blowing his nose) Slight sore throat No ear pain  Using delsym for diabetics--not clearly helpful Some pain along his flanks with the cough  Current Outpatient Medications on File Prior to Visit  Medication Sig Dispense Refill  . aspirin 81 MG tablet Take 81 mg by mouth once.    . bisoprolol-hydrochlorothiazide (ZIAC) 10-6.25 MG tablet TAKE 1 TABLET BY MOUTH  DAILY 90 tablet 1  . diclofenac (VOLTAREN) 75 MG EC tablet TAKE 1 TABLET BY MOUTH TWO  TIMES DAILY 90 tablet 0  . glipiZIDE (GLUCOTROL) 10 MG tablet TAKE 1 TABLET BY MOUTH  TWICE A DAY BEFORE MEALS 180 tablet 1  . glucose blood (ONE TOUCH TEST STRIPS) test strip Use as instructed two times daily; dx- E11.42, E11.65 200 each 5  . HUMALOG KWIKPEN 200 UNIT/ML SOPN INJECT 20 UNITS INTO THE SKIN DAILY BEFORE SUPPER. 15 pen 1  . Insulin Glargine (LANTUS SOLOSTAR) 100 UNIT/ML Solostar Pen Inject 20 units in am and 65 units at night under skin 60 mL 3  . Insulin Pen Needle (NOVOTWIST) 32G X 5 MM MISC USE 2x a day 100 each 6  . Insulin Pen Needle (NOVOTWIST) 32G X 5 MM MISC USE AS DIRECTED 5 times daily with insulin injections, insulin dependent, E11.65 500 each 5  . metFORMIN (GLUCOPHAGE) 1000 MG tablet TAKE 1 TABLET BY MOUTH  TWICE A DAY WITH MEALS 180 tablet 3  . oxyCODONE-acetaminophen (PERCOCET/ROXICET) 5-325 MG tablet Take 1 tablet by mouth 2 (two) times daily as needed for severe pain. Reported on 05/03/2016    . TRULICITY 1.5 MG/0.5ML SOPN INJECT 1.5 MG ONCE WEEKLY. 2 pen 3   No current facility-administered medications on file prior to visit.      Allergies  Allergen Reactions  . Penicillins     REACTION: swelling, rash    Past Medical History:  Diagnosis Date  . Chronic back pain 09/13/2013  . Chronic pain syndrome 09/13/2013  . GERD (gastroesophageal reflux disease)    hx. of  . Gout 09/13/2013  . Hemorrhoids   . Hypertension   . Morbid obesity with body mass index of 45.0-49.9 in adult [facility](HCC) 09/13/2013  . Osteoarthritis 09/13/2013  . Pneumonia    hx. of  . Seizures (HCC)   . Sleep apnea    has never used C-pap machine due to insurance cost  . Type 1 diabetes mellitus, uncontrolled (HCC)     Past Surgical History:  Procedure Laterality Date  . broken toe 35 years ago    . TOTAL KNEE ARTHROPLASTY Right 05/01/2015   Procedure: RIGHT TOTAL KNEE ARTHROPLASTY;  Surgeon: Kathryne Hitchhristopher Y Blackman, MD;  Location: [facility];  Service: Orthopedics;  Laterality: Right;  . WISDOM TOOTH EXTRACTION      Family History  Problem Relation Age of Onset  . Alcohol abuse Father   . Arthritis Maternal Grandmother   . Alcohol abuse Paternal Grandmother   . Alcohol abuse Paternal Grandfather   . Diabetes Maternal Aunt     Social History   Socioeconomic History  . Marital status: Married  Spouse name: Not on file  . Number of children: Not on file  . Years of education: Not on file  . Highest education level: Not on file  Social Needs  . Financial resource strain: Not on file  . Food insecurity - worry: Not on file  . Food insecurity - inability: Not on file  . Transportation needs - medical: Not on file  . Transportation needs - non-medical: Not on file  Occupational History  . Not on file  Tobacco Use  . Smoking status: Former Smoker    Last attempt to quit: 03/08/2004    Years since quitting: 13.8  . Smokeless tobacco: Never Used  Substance and Sexual Activity  . Alcohol use: No    Comment: rare  . Drug use: No  . Sexual activity: Yes    Partners: Female  Other Topics Concern  . Not on file  Social History  Narrative   Psychiatric nurse for [employer]   Married   Review of Systems  No history of asthma No wheezing No rash No vomiting or diarrhea Appetite is fine     Objective:   Physical Exam  HENT:  Mouth/Throat: Oropharynx is clear and moist. No oropharyngeal exudate.  No sinus tenderness Moderate nasal swelling---mostly on left TMs normal  Neck: No thyromegaly present.  Pulmonary/Chest: Effort normal and breath sounds normal. No respiratory distress. He has no wheezes. He has no rales.  Slightly decreased breath sounds and tight cough  Lymphadenopathy:    He has no cervical adenopathy.          Assessment & Plan:

## 2018-01-16 NOTE — Patient Instructions (Signed)
Let me know if you are worsening as the week goes on, instead of improving (I would try an antibiotic then).

## 2018-01-16 NOTE — Assessment & Plan Note (Signed)
Seems like viral infection but some SOB----seems almost like an asthmatic component Will try 3 days of prednisone No antibiotic for now

## 2018-01-18 ENCOUNTER — Other Ambulatory Visit: Payer: Self-pay | Admitting: Family Medicine

## 2018-01-18 NOTE — Telephone Encounter (Signed)
Last office visit 01/16/2018 with Dr. Alphonsus SiasLetvak for bronchitis.  Last refilled 11/19/2017 for #90 with no refills.  Ok to refill?  Dosing in bid.  Should we change quantity??

## 2018-01-27 ENCOUNTER — Other Ambulatory Visit: Payer: Self-pay | Admitting: Internal Medicine

## 2018-02-22 ENCOUNTER — Ambulatory Visit (INDEPENDENT_AMBULATORY_CARE_PROVIDER_SITE_OTHER): Payer: PRIVATE HEALTH INSURANCE | Admitting: Internal Medicine

## 2018-02-22 ENCOUNTER — Encounter: Payer: Self-pay | Admitting: Internal Medicine

## 2018-02-22 VITALS — BP 114/74 | HR 82 | Ht 66.25 in | Wt 333.8 lb

## 2018-02-22 DIAGNOSIS — E1142 Type 2 diabetes mellitus with diabetic polyneuropathy: Secondary | ICD-10-CM

## 2018-02-22 DIAGNOSIS — E1165 Type 2 diabetes mellitus with hyperglycemia: Secondary | ICD-10-CM

## 2018-02-22 DIAGNOSIS — Z6841 Body Mass Index (BMI) 40.0 and over, adult: Secondary | ICD-10-CM

## 2018-02-22 DIAGNOSIS — E785 Hyperlipidemia, unspecified: Secondary | ICD-10-CM | POA: Diagnosis not present

## 2018-02-22 DIAGNOSIS — IMO0002 Reserved for concepts with insufficient information to code with codable children: Secondary | ICD-10-CM

## 2018-02-22 DIAGNOSIS — E1169 Type 2 diabetes mellitus with other specified complication: Secondary | ICD-10-CM | POA: Diagnosis not present

## 2018-02-22 LAB — POCT GLYCOSYLATED HEMOGLOBIN (HGB A1C): HEMOGLOBIN A1C: 9

## 2018-02-22 MED ORDER — INSULIN LISPRO 200 UNIT/ML ~~LOC~~ SOPN: 25.0000 [IU] | PEN_INJECTOR | Freq: Three times a day (TID) | SUBCUTANEOUS | 5 refills | Status: DC

## 2018-02-22 MED ORDER — INSULIN GLARGINE 100 UNIT/ML SOLOSTAR PEN: PEN_INJECTOR | SUBCUTANEOUS | 5 refills | Status: DC

## 2018-02-22 NOTE — Progress Notes (Signed)
Patient ID: Christian [name], male   DOB: 17-Jul-1951, 67 y.o.   MRN: [id number]  HPI Christian [name] is a 67 y.o.-year-old male, returning for f/u for DM2, dx 2009, insulin-dependent since 2014, uncontrolled, with complications (Peripheral neuropathy). Last visit 3 months ago. [name] [facility] [employer].   Last hemoglobin A1c was: Lab Results  Component Value Date   HGBA1C 9.1 11/23/2017   HGBA1C 9 08/24/2017   HGBA1C 8.4 05/25/2017  08/24/2017: HbA1c 9%  Pt is on: - Lantus 20 units in a.m. and 65 units at bedtime - Humalog 15-20 >> 25 units before each of the 3 meals (he increased this 4 days ago) - Metformin 1000 mg 2x a daily - Glipizide 10 mg 2x a day  - Trulicity 1.5 mg weekly Stopped Jardiance 25 mg >> but too expensive - in the donut hole; had yeast infections. Glipizide and Invokana were started 03/2014.  He has been on Victoza in the past >> not efficient anymore.  Pt checks his sugars 2x a day (NovoLog, no meter): - am: 108, 140-170, 302 (Holidays: 300s) >> in last 6 weeks 160-300 - 2h after b'fast: n/c >> 157 >> n/c - before lunch: 189-228 >> n/c  >> 220-230 >> 280 - 2h after lunch: 200s >> 230-235 >> 220-230 >> n/c - before dinner: 180s >> n/c >> 200s >> n/c - 2h after dinner: n/c - bedtime:  230s, 318 last night >> n/c  - nighttime: n/c Lowest sugar was 80s >> 108 >> 104; he has hypoglycemia awareness in the 90s. Highest sugar was 300s >> 300s >> 300s.  Meter: OneTouch Ultra Mini  Pt's meals are: - Brunch: dry cereal + raisins + no milk; misc. Fruit (banana/orange); V8 juice;fried egg + cheddar cheese - Dinner: usually frozen meal  - Snacks: 2-3 nabs  - No CKD, last BUN/creatinine:  Lab Results  Component Value Date   BUN 17 08/31/2017   CREATININE 0.79 08/31/2017  Not on  ACE inhibitors/ARB's.  Latest ACR normal: Lab Results  Component Value Date   MICRALBCREAT 1.1 08/31/2017   MICRALBCREAT 0.8 08/20/2015   MICRALBCREAT 0.3 09/11/2013   Latest GFR  normal: Lab Results  Component Value Date   GFRNONAA >60 05/02/2015   GFRNONAA >60 04/27/2015   - + HL; last set of lipids: Lab Results  Component Value Date   CHOL 146 08/31/2017   HDL 34.30 (L) 08/31/2017   LDLCALC 84 08/31/2017   TRIG 137.0 08/31/2017   CHOLHDL 4 08/31/2017  Not on statins because of leg cramps. - last eye exam was in 07/2017: +DR  - + Numbness/tingling in feet  He had R TKR in 04/2015. He has OSA.  ROS: Constitutional: no weight gain/no weight loss, no fatigue, no subjective hyperthermia, no subjective hypothermia Eyes: no blurry vision, no xerophthalmia ENT: no sore throat, no nodules palpated in throat, no dysphagia, no odynophagia, no hoarseness Cardiovascular: no CP/no SOB/no palpitations/no leg swelling Respiratory: no cough/no SOB/no wheezing Gastrointestinal: no N/no V/no D/no C/no acid reflux Musculoskeletal: no muscle aches/no joint aches Skin: no rashes, no hair loss Neurological: no tremors/+ numbness/+ tingling/no dizziness  I reviewed pt's medications, allergies, PMH, social hx, family hx, and changes were documented in the history of present illness. Otherwise, unchanged from my initial visit note.  PE: BP 114/74   Pulse 82   Ht 5' 6.25" (1.683 m)   Wt (!) 333 lb 12.8 oz (151.4 kg)   SpO2 96%   BMI 53.47 kg/m  Body mass  index is 53.47 kg/m.   Wt Readings from Last 3 Encounters:  02/22/18 (!) 333 lb 12.8 oz (151.4 kg)  01/16/18 (!) 333 lb (151 kg)  12/11/17 (!) 331 lb 4 oz (150.3 kg)   Constitutional: overweight, in NAD Eyes: PERRLA, EOMI, no exophthalmos ENT: moist mucous membranes, no thyromegaly, no cervical lymphadenopathy Cardiovascular: RRR, No MRG, + bilateral leg swelling L>R Respiratory: CTA B Gastrointestinal: abdomen soft, NT, ND, BS+ Musculoskeletal: no deformities, strength intact in all 4 Skin: moist, warm, no rashes Neurological: no tremor with outstretched hands, DTR normal in all 4  ASSESSMENT: 1. DM2,  insulin-dependent, uncontrolled, with complications - PN - stable, worse in L foot  2. PN  3.  Obesity  PLAN:  1. Patient with uncontrolled type 2 diabetes, on a complex medication regimen containing oral medicines, basal-bolus insulin regimen, and GLP-1 receptor agonist.  At the previous visits, we increased his Humalog to be taken with every meal as his sugars were higher as the day went by.  We also added a low-dose of Lantus in the morning.  We discussed at length about the need to change his diet, start exercise, and lose weight.  He has a tricycle and was planning to start using it. - At this visit, sugars are higher and weight has not decreased. - We again discussed about the absolute need to start changing his diet by reducing fatty and processed foods - Otherwise, since his sugars are high throughout the day, will increase his Lantus in the morning and Humalog with meals-  - At next visit, we may need to switch to U500 insulin - Toujeo was not covered for him in the past but we discussed that when he needs a refill for Lantus to let me know so I can switch to [place]iba U 200 - I advised him to: Patient Instructions  Please continue: - Metformin 1000 mg 2x a daily - Glipizide 10 mg 2x a day  - Trulicity 1.5 mg weekly  Please increase:  - Lantus 30 units in a.m. and 65 units at bedtime - Humalog 25-35 units before each of the 3 meals  Please come back for a follow-up appointment in 3 months.   - today, HbA1c is 9.0% (stable, high) - continue checking sugars at different times of the day - check 3x a day, rotating checks - advised for yearly eye exams >> he is UTD - Return to clinic in 3 mo with sugar log     2. PN -Worsening left foot -also more swollen. -At last visit, I suggested to see podiatry for foot care  3.  Obesity - continues on Trulicity which helps with both diabetes and weight management - Again discussed the importance of improving diet  Carlus Pavlovristina Darrol Brandenburg, MD  PhD [facility]

## 2018-02-22 NOTE — Patient Instructions (Addendum)
Please continue: - Metformin 1000 mg 2x a daily - Glipizide 10 mg 2x a day  - Trulicity 1.5 mg weekly  Please increase:  - Lantus 30 units in a.m. and 65 units at bedtime - Humalog 25-35 units before each of the 3 meals  Please come back for a follow-up appointment in 3 months.

## 2018-03-12 ENCOUNTER — Other Ambulatory Visit: Payer: Self-pay | Admitting: Internal Medicine

## 2018-03-13 ENCOUNTER — Other Ambulatory Visit: Payer: Self-pay | Admitting: Internal Medicine

## 2018-03-13 ENCOUNTER — Other Ambulatory Visit: Payer: Self-pay

## 2018-03-13 MED ORDER — INSULIN LISPRO 200 UNIT/ML ~~LOC~~ SOPN: 25.0000 [IU] | PEN_INJECTOR | Freq: Three times a day (TID) | SUBCUTANEOUS | 5 refills | Status: DC

## 2018-03-13 MED ORDER — DULAGLUTIDE 1.5 MG/0.5ML ~~LOC~~ SOAJ: SUBCUTANEOUS | 3 refills | Status: DC

## 2018-03-21 ENCOUNTER — Telehealth: Payer: Self-pay | Admitting: Endocrinology

## 2018-03-21 ENCOUNTER — Other Ambulatory Visit: Payer: Self-pay

## 2018-03-21 ENCOUNTER — Telehealth: Payer: Self-pay | Admitting: Internal Medicine

## 2018-03-21 MED ORDER — INSULIN LISPRO 200 UNIT/ML ~~LOC~~ SOPN: 25.0000 [IU] | PEN_INJECTOR | Freq: Three times a day (TID) | SUBCUTANEOUS | 5 refills | Status: DC

## 2018-03-21 NOTE — Telephone Encounter (Signed)
Patient need a new prescription for humalog with new directions. [id number] [place],  - [address] DEA #:  [id number]

## 2018-03-21 NOTE — Telephone Encounter (Signed)
This has been done.

## 2018-03-30 ENCOUNTER — Other Ambulatory Visit: Payer: Self-pay | Admitting: Internal Medicine

## 2018-03-30 MED ORDER — INSULIN LISPRO 200 UNIT/ML ~~LOC~~ SOPN: 25.0000 [IU] | PEN_INJECTOR | Freq: Three times a day (TID) | SUBCUTANEOUS | 5 refills | Status: DC

## 2018-03-30 NOTE — Telephone Encounter (Signed)
Need refill for Humalog 25 units been trying to get this seteled out for weeks patient wife stated. Please advise \ [id number] [place], [place] - [address] AT [address] [phone] (Phone) [phone] (Fax)

## 2018-03-30 NOTE — Telephone Encounter (Signed)
Spoke to patient. Apologized Rx was originally "no print". Resent Rx. Pt verbalized understanding.

## 2018-04-04 ENCOUNTER — Encounter (INDEPENDENT_AMBULATORY_CARE_PROVIDER_SITE_OTHER): Payer: Self-pay | Admitting: Orthopaedic Surgery

## 2018-04-04 ENCOUNTER — Ambulatory Visit (INDEPENDENT_AMBULATORY_CARE_PROVIDER_SITE_OTHER): Payer: PRIVATE HEALTH INSURANCE | Admitting: Orthopaedic Surgery

## 2018-04-04 ENCOUNTER — Ambulatory Visit (INDEPENDENT_AMBULATORY_CARE_PROVIDER_SITE_OTHER): Payer: PRIVATE HEALTH INSURANCE

## 2018-04-04 DIAGNOSIS — M1712 Unilateral primary osteoarthritis, left knee: Secondary | ICD-10-CM

## 2018-04-04 DIAGNOSIS — G8929 Other chronic pain: Secondary | ICD-10-CM

## 2018-04-04 DIAGNOSIS — M25562 Pain in left knee: Secondary | ICD-10-CM

## 2018-04-04 MED ORDER — LIDOCAINE HCL 1 % IJ SOLN
3.0000 mL | INTRAMUSCULAR | Status: AC | PRN
  Administered 2018-04-04: 3 mL

## 2018-04-04 MED ORDER — HYDROCODONE-ACETAMINOPHEN 5-325 MG PO TABS: 1.0000 | ORAL_TABLET | Freq: Four times a day (QID) | ORAL | 0 refills | Status: DC | PRN

## 2018-04-04 MED ORDER — METHYLPREDNISOLONE ACETATE 40 MG/ML IJ SUSP
40.0000 mg | INTRAMUSCULAR | Status: AC | PRN
  Administered 2018-04-04: 40 mg via INTRA_ARTICULAR

## 2018-04-04 NOTE — Progress Notes (Signed)
Office Visit Note   Patient: Christian [name]           Date of Birth: 20-Jun-1951           MRN: [id number] Visit Date: 04/04/2018              Requested by: Hannah Beat, MD [address] [place], [place] [address] PCP: Hannah Beat, MD   Assessment & Plan: Visit Diagnoses:  1. Chronic pain of left knee   2. Unilateral primary osteoarthritis, left knee     Plan: I did counsel him about weight loss and activity modification.  I talked to him about trying a steroid injection understanding this can affect his blood glucose but no mild loss of what else we can try until he works on weight loss and better blood glucose control.  I explained the risk minutes of injections to him.  We will try a steroid injection today and potentially hyaluronic acid injection in the near future to try to help temporize his pain and discomfort.  He did tolerate the steroid injection well in the office today.  He is a good candidate for hyaluronic acid for the left knee to treat osteoarthritic pain so we will order that to be placed in his knee in a month from now.  All questions concerns were answered and addressed.  Follow-Up Instructions: Return in about 1 month (around 05/02/2018).   Orders:  Orders Placed This Encounter  Procedures  . Large Joint Inj  . Large Joint Inj  . XR Knee 1-2 Views Left   Meds ordered this encounter  Medications  . HYDROcodone-acetaminophen (NORCO/VICODIN) 5-325 MG tablet    Sig: Take 1-2 tablets by mouth every 6 (six) hours as needed for moderate pain.    Dispense:  40 tablet    Refill:  0      Procedures: Large Joint Inj: L knee on 04/04/2018 10:20 AM Indications: diagnostic evaluation and pain Details: 22 G 1.5 in needle, superolateral approach  Arthrogram: No  Medications: 3 mL lidocaine 1 %; 40 mg methylPREDNISolone acetate 40 MG/ML Outcome: tolerated well, no immediate complications Procedure, treatment alternatives, risks and benefits  explained, specific risks discussed. Consent was given by the patient. Immediately prior to procedure a time out was called to verify the correct patient, procedure, equipment, support staff and site/side marked as required. Patient was prepped and draped in the usual sterile fashion.       Clinical Data: No additional findings.   Subjective: Chief Complaint  Patient presents with  . Left Knee - Edema, Pain  Patient is someone seen before.  He is morbidly obese 67 year old gentleman with diabetes who comes in with severe left knee pain.  We actually replaced his right knee in 2016.  He did weigh less than.  He is retired now and is put on more weight.  He reports recently that his blood glucose is doing well but his last hemoglobin A1c was high.  His pain is daily and it is worse with activities.  Is starting to detrimental effect is active daily living and his mobility and his quality of life.  He denies a specific injury.  This is been slowly worsening with time.  HPI  Review of Systems He currently denies any headache, chest pain, shortness of breath, fever, chills, nausea, vomiting.  Objective: Vital Signs: There were no vitals taken for this visit.  Physical Exam He is alert and oriented x3 and in no acute distress but  obvious discomfort. Ortho Exam Examination of his left knee does show varus malalignment.  There is a mild effusion.  He is globally tender with any attempts to palpation or range of motion in the but he cannot bend it. Specialty Comments:  No specialty comments available.  Imaging: Xr Knee 1-2 Views Left  Result Date: 04/04/2018 2 views left knee show severe tricompartmental arthritic changes with complete loss of joint space medial, lateral and patellofemoral.    PMFS History: Patient Active Problem List   Diagnosis Date Noted  . Acute bronchitis 01/16/2018  . Hyperlipidemia associated with type 2 diabetes mellitus (HCC) 09/03/2017  . Diabetic  neuropathy associated with type 2 diabetes mellitus (HCC) 05/25/2017  . Uncontrolled type 2 diabetes mellitus with peripheral neuropathy (HCC) 01/27/2016  . 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 Gout 09/13/2013  . Chronic pain syndrome 09/13/2013  . Osteoarthritis 09/13/2013  . Chronic back pain 09/13/2013  . Morbid obesity with body mass index (BMI) of 50.0 to 59.9 in adult [facility]) 09/13/2013  . Seizures (HCC)   . Hypertension    Past Medical History:  Diagnosis Date  . Chronic back pain 09/13/2013  . Chronic pain syndrome 09/13/2013  . GERD (gastroesophageal reflux disease)    hx. of  . Gout 09/13/2013  . Hemorrhoids   . Hypertension   . Morbid obesity with body mass index of 45.0-49.9 in adult [facility]) 09/13/2013  . Osteoarthritis 09/13/2013  . Pneumonia    hx. of  . Seizures (HCC)   . Sleep apnea    has never used C-pap machine due to insurance cost  . Type 1 diabetes mellitus, uncontrolled (HCC)     Family History  Problem Relation Age of Onset  . Alcohol abuse Father   . Arthritis Maternal Grandmother   . Alcohol abuse Paternal Grandmother   . Alcohol abuse Paternal Grandfather   . Diabetes Maternal Aunt     Past Surgical History:  Procedure Laterality Date  . broken toe 35 years ago    . TOTAL KNEE ARTHROPLASTY Right 05/01/2015   Procedure: RIGHT TOTAL KNEE ARTHROPLASTY;  Surgeon: Kathryne Hitch, MD;  Location: [facility];  Service: Orthopedics;  Laterality: Right;  . WISDOM TOOTH EXTRACTION     Social History   Occupational History  . Not on file  Tobacco Use  . Smoking status: Former Smoker    Last attempt to quit: 03/08/2004    Years since quitting: 14.0  . Smokeless tobacco: Never Used  Substance and Sexual Activity  . Alcohol use: No    Comment: rare  . Drug use: No  . Sexual activity: Yes    Partners: Female

## 2018-04-06 ENCOUNTER — Telehealth (INDEPENDENT_AMBULATORY_CARE_PROVIDER_SITE_OTHER): Payer: Self-pay

## 2018-04-06 NOTE — Telephone Encounter (Signed)
Submitted application online for SynviscOne injection, left knee.  

## 2018-04-10 ENCOUNTER — Other Ambulatory Visit: Payer: Self-pay | Admitting: *Deleted

## 2018-04-10 ENCOUNTER — Telehealth: Payer: Self-pay | Admitting: Family Medicine

## 2018-04-10 MED ORDER — METFORMIN HCL 1000 MG PO TABS: 1000.0000 mg | ORAL_TABLET | Freq: Two times a day (BID) | ORAL | 3 refills | Status: DC

## 2018-04-10 NOTE — Telephone Encounter (Signed)
Copied from CRM [id number]. Topic: Quick Communication - Rx Refill/Question >> Apr 10, 2018  4:07 PM Rudi CocoLathan, Mervyn Pflaum M, NT wrote: Medication: metFORMIN (GLUCOPHAGE) 1000 MG tablet [[id number]][[id number]]   Has the patient contacted their pharmacy? yes (Agent: If no, request that the patient contact the pharmacy for the refill.) (Agent: If yes, when and what did the pharmacy advise?)  Preferred Pharmacy (with phone number or street name): [id number] - [address] [facility] [address] [place] [id number] Phone: [phone] Fax: [phone]    Agent: Please be advised that RX refills may take up to 3 business days. We ask that you follow-up with your pharmacy.

## 2018-04-11 MED ORDER — METFORMIN HCL 1000 MG PO TABS: 1000.0000 mg | ORAL_TABLET | Freq: Two times a day (BID) | ORAL | 1 refills | Status: DC

## 2018-04-11 NOTE — Telephone Encounter (Signed)
[facility] [address]

## 2018-04-11 NOTE — Telephone Encounter (Signed)
Pt called stating they wanted the Rx for metFORMIN (GLUCOPHAGE) 1000 MG tablet to go to  [id number] [place] [place], [place] - [address] [facility] [address] [phone] (Phone) [phone] (Fax)

## 2018-04-24 ENCOUNTER — Telehealth (INDEPENDENT_AMBULATORY_CARE_PROVIDER_SITE_OTHER): Payer: Self-pay

## 2018-04-24 NOTE — Telephone Encounter (Signed)
Patient approved for SynviscOne injection, left knee. [facility]. Buy & Bill No PA required.

## 2018-05-02 ENCOUNTER — Ambulatory Visit (INDEPENDENT_AMBULATORY_CARE_PROVIDER_SITE_OTHER): Payer: PRIVATE HEALTH INSURANCE | Admitting: Orthopaedic Surgery

## 2018-05-02 ENCOUNTER — Encounter (INDEPENDENT_AMBULATORY_CARE_PROVIDER_SITE_OTHER): Payer: Self-pay | Admitting: Orthopaedic Surgery

## 2018-05-02 DIAGNOSIS — M1712 Unilateral primary osteoarthritis, left knee: Secondary | ICD-10-CM

## 2018-05-02 DIAGNOSIS — G8929 Other chronic pain: Secondary | ICD-10-CM

## 2018-05-02 DIAGNOSIS — M25562 Pain in left knee: Secondary | ICD-10-CM

## 2018-05-02 MED ORDER — HYLAN G-F 20 48 MG/6ML IX SOSY
48.0000 mg | PREFILLED_SYRINGE | INTRA_ARTICULAR | Status: AC | PRN
  Administered 2018-05-02: 48 mg via INTRA_ARTICULAR

## 2018-05-02 NOTE — Progress Notes (Signed)
   Procedure Note  Patient: Christian [name]             Date of Birth: 12/17/1950           MRN: [id number]             Visit Date: 05/02/2018  Procedures: Visit Diagnoses: Chronic pain of left knee  Unilateral primary osteoarthritis, left knee  Large Joint Inj: L knee on 05/02/2018 10:39 AM Indications: pain and diagnostic evaluation Details: 22 G 1.5 in needle, superolateral approach  Arthrogram: No  Medications: 48 mg Hylan 48 MG/6ML Outcome: tolerated well, no immediate complications Procedure, treatment alternatives, risks and benefits explained, specific risks discussed. Consent was given by the patient. Immediately prior to procedure a time out was called to verify the correct patient, procedure, equipment, support staff and site/side marked as required. Patient was prepped and draped in the usual sterile fashion.    The patient is here today for scheduled hyaluronic acid injection with Synvisc 1 in his left knee to treat pain from osteoarthritis.  He understands fully the risk and benefits of these injections having had them before.  He has a history of a right total knee arthroplasty.  He is already had steroid injection as well.  On exam he does not have any significant knee joint effusion.  He has painful range of motion of the but it is stable.  He tolerated the Synvisc injection well.  Follow-up will be as needed.

## 2018-05-25 ENCOUNTER — Encounter: Payer: Self-pay | Admitting: Internal Medicine

## 2018-05-25 ENCOUNTER — Ambulatory Visit (INDEPENDENT_AMBULATORY_CARE_PROVIDER_SITE_OTHER): Payer: PRIVATE HEALTH INSURANCE | Admitting: Internal Medicine

## 2018-05-25 VITALS — BP 120/70 | HR 72 | Ht 66.25 in | Wt 326.6 lb

## 2018-05-25 DIAGNOSIS — E1142 Type 2 diabetes mellitus with diabetic polyneuropathy: Secondary | ICD-10-CM

## 2018-05-25 DIAGNOSIS — E1165 Type 2 diabetes mellitus with hyperglycemia: Secondary | ICD-10-CM

## 2018-05-25 DIAGNOSIS — E1169 Type 2 diabetes mellitus with other specified complication: Secondary | ICD-10-CM | POA: Diagnosis not present

## 2018-05-25 DIAGNOSIS — IMO0002 Reserved for concepts with insufficient information to code with codable children: Secondary | ICD-10-CM

## 2018-05-25 DIAGNOSIS — E785 Hyperlipidemia, unspecified: Secondary | ICD-10-CM

## 2018-05-25 LAB — POCT GLYCOSYLATED HEMOGLOBIN (HGB A1C): HEMOGLOBIN A1C: 7.4 % — AB (ref 4.0–5.6)

## 2018-05-25 MED ORDER — INSULIN GLARGINE 100 UNIT/ML SOLOSTAR PEN: PEN_INJECTOR | SUBCUTANEOUS | 5 refills | Status: DC

## 2018-05-25 NOTE — Progress Notes (Signed)
Patient ID: SENCERE SYMONETTE, male   DOB: 03/11/1951, 67 y.o.   MRN: [id number]  HPI FABIEN TRAVELSTEAD is a 67 y.o.-year-old male, returning for f/u for DM2, dx 2009, insulin-dependent since 2014, uncontrolled, with complications (Peripheral neuropathy). Last visit  3 months ago. [name] [employer].   He had a steroid inj in 04/2018.  Sugars are higher than, in the 300s, for about 3 days.  Last hemoglobin A1c was: Lab Results  Component Value Date   HGBA1C 9.0 02/22/2018   HGBA1C 9.1 11/23/2017   HGBA1C 9 08/24/2017  08/24/2017: HbA1c 9%  Pt is on: - Metformin 1000 mg 2x a daily - Glipizide 10 mg 2x a day  - Trulicity 1.5 mg weekly - Lantus 20 in AM and 65 units at bedtime - Humalog 25 units 3x a day before meals  Stopped Jardiance 25 mg >> but too expensive - in the donut hole; had yeast infections. Glipizide and Invokana were started 03/2014.  He has been on Victoza in the past >> not efficient anymore.  Pt checks his sugars 1x a day - per download of his meter: - am:  160-300 >> 103, 121-164, 187 - 2h after b'fast: n/c >> 157 >> n/c - before lunch: n/c  >> 220-230 >> 280 >> 125 - 2h after lunch: 230-235 >> 220-230 >> n/c - before dinner: 180s >> n/c >> 200s >> n/c - 2h after dinner: n/c - bedtime:  230s, 318 last night >> n/c  - nighttime: n/c Lowest sugar was 104 >> 102; he has hypoglycemia awareness in the 90s. Highest sugar was 300s >> 360 (steroids), OTW 200.  Meter: OneTouch Ultra Mini  Pt's meals are: - Brunch: dry cereal + raisins + no milk; misc. Fruit (banana/orange); V8 juice;fried egg + cheddar cheese - Dinner: usually frozen meal  - Snacks: 2-3 nabs  -No CKD, last BUN/creatinine:  Lab Results  Component Value Date   BUN 17 08/31/2017   CREATININE 0.79 08/31/2017  Not on an ACE inhibitor/ARB.  Latest ACR normal: Lab Results  Component Value Date   MICRALBCREAT 1.1 08/31/2017   MICRALBCREAT 0.8 08/20/2015   MICRALBCREAT 0.3 09/11/2013   Latest GFR  normal: Lab Results  Component Value Date   GFRNONAA >60 05/02/2015   GFRNONAA >60 04/27/2015   - + HL; last set of lipids: Lab Results  Component Value Date   CHOL 146 08/31/2017   HDL 34.30 (L) 08/31/2017   LDLCALC 84 08/31/2017   TRIG 137.0 08/31/2017   CHOLHDL 4 08/31/2017  Could not tolerate statins because of leg cramps. - last eye exam was in  07/2017: + DR - he has Numbness/tingling in feet  He had R TKR in 04/2015. He has OSA.  ROS: Constitutional: no weight gain/no weight loss, no fatigue, no subjective hyperthermia, no subjective hypothermia Eyes: no blurry vision, no xerophthalmia ENT: no sore throat, no nodules palpated in throat, no dysphagia, no odynophagia, no hoarseness Cardiovascular: no CP/no SOB/no palpitations/no leg swelling Respiratory: + cough/no SOB/no wheezing Gastrointestinal: no N/no V/no D/no C/no acid reflux Musculoskeletal: no muscle aches/no joint aches Skin: no rashes, no hair loss Neurological: no tremors/+ numbness/+ tingling/no dizziness  I reviewed pt's medications, allergies, PMH, social hx, family hx, and changes were documented in the history of present illness. Otherwise, unchanged from my initial visit note.  Past Medical History:  Diagnosis Date  . Chronic back pain 09/13/2013  . Chronic pain syndrome 09/13/2013  . GERD (gastroesophageal reflux disease)  hx. of  . Gout 09/13/2013  . Hemorrhoids   . Hypertension   . Morbid obesity with body mass index of 45.0-49.9 in adult [facility]) 09/13/2013  . Osteoarthritis 09/13/2013  . Pneumonia    hx. of  . Seizures (HCC)   . Sleep apnea    has never used C-pap machine due to insurance cost  . Type 1 diabetes mellitus, uncontrolled (HCC)    Past Surgical History:  Procedure Laterality Date  . broken toe 35 years ago    . TOTAL KNEE ARTHROPLASTY Right 05/01/2015   Procedure: RIGHT TOTAL KNEE ARTHROPLASTY;  Surgeon: Kathryne Hitch, MD;  Location: [facility];  Service: Orthopedics;   Laterality: Right;  . WISDOM TOOTH EXTRACTION     Social History   Socioeconomic History  . Marital status: Married    Spouse name: Not on file  . Number of children: Not on file  . Years of education: Not on file  . Highest education level: Not on file  Occupational History  . Not on file  Social Needs  . Financial resource strain: Not on file  . Food insecurity:    Worry: Not on file    Inability: Not on file  . Transportation needs:    Medical: Not on file    Non-medical: Not on file  Tobacco Use  . Smoking status: Former Smoker    Last attempt to quit: 03/08/2004    Years since quitting: 14.2  . Smokeless tobacco: Never Used  Substance and Sexual Activity  . Alcohol use: No    Comment: rare  . Drug use: No  . Sexual activity: Yes    Partners: Female  Lifestyle  . Physical activity:    Days per week: Not on file    Minutes per session: Not on file  . Stress: Not on file  Relationships  . Social connections:    Talks on phone: Not on file    Gets together: Not on file    Attends religious service: Not on file    Active member of club or organization: Not on file    Attends meetings of clubs or organizations: Not on file    Relationship status: Not on file  . Intimate partner violence:    Fear of current or ex partner: Not on file    Emotionally abused: Not on file    Physically abused: Not on file    Forced sexual activity: Not on file  Other Topics Concern  . Not on file  Social History Narrative   Psychiatric nurse for [employer]   Married   Current Outpatient Medications on File Prior to Visit  Medication Sig Dispense Refill  . aspirin 81 MG tablet Take 81 mg by mouth once.    . bisoprolol-hydrochlorothiazide (ZIAC) 10-6.25 MG tablet TAKE 1 TABLET BY MOUTH  DAILY 90 tablet 1  . diclofenac (VOLTAREN) 75 MG EC tablet TAKE 1 TABLET BY MOUTH TWO  TIMES DAILY 180 tablet 1  . Dulaglutide (TRULICITY) 1.5 MG/0.5ML SOPN INJECT 1.5 MG ONCE WEEKLY 4 pen 3  .  glipiZIDE (GLUCOTROL) 10 MG tablet TAKE 1 TABLET BY MOUTH  TWICE A DAY BEFORE MEALS 180 tablet 1  . glucose blood (ONE TOUCH TEST STRIPS) test strip Use as instructed two times daily; dx- E11.42, E11.65 200 each 5  . HYDROcodone-acetaminophen (NORCO/VICODIN) 5-325 MG tablet Take 1-2 tablets by mouth every 6 (six) hours as needed for moderate pain. 40 tablet 0  . Insulin Glargine (LANTUS SOLOSTAR)  100 UNIT/ML Solostar Pen Inject 30 units in am and 65 units at night under skin 60 mL 5  . Insulin Lispro (HUMALOG KWIKPEN) 200 UNIT/ML SOPN Inject 25-35 Units into the skin 3 (three) times daily. 15 pen 5  . Insulin Pen Needle (NOVOTWIST) 32G X 5 MM MISC USE 2x a day 100 each 6  . Insulin Pen Needle (NOVOTWIST) 32G X 5 MM MISC USE AS DIRECTED 5 times daily with insulin injections, insulin dependent, E11.65 500 each 5  . metFORMIN (GLUCOPHAGE) 1000 MG tablet Take 1 tablet (1,000 mg total) by mouth 2 (two) times daily with a meal. 180 tablet 1  . oxyCODONE-acetaminophen (PERCOCET/ROXICET) 5-325 MG tablet Take 1 tablet by mouth 2 (two) times daily as needed for severe pain. Reported on 05/03/2016    . predniSONE (DELTASONE) 20 MG tablet Take 2 tablets (40 mg total) by mouth daily. 6 tablet 0   No current facility-administered medications on file prior to visit.    Allergies  Allergen Reactions  . Penicillins     REACTION: swelling, rash   Family History  Problem Relation Age of Onset  . Alcohol abuse Father   . Arthritis Maternal Grandmother   . Alcohol abuse Paternal Grandmother   . Alcohol abuse Paternal Grandfather   . Diabetes Maternal Aunt     PE: BP 120/70   Pulse 72   Ht 5' 6.25" (1.683 m)   Wt (!) 326 lb 9.6 oz (148.1 kg)   SpO2 95%   BMI 52.32 kg/m  There is no height or weight on file to calculate BMI.   Wt Readings from Last 3 Encounters:  05/25/18 (!) 326 lb 9.6 oz (148.1 kg)  02/22/18 (!) 333 lb 12.8 oz (151.4 kg)  01/16/18 (!) 333 lb (151 kg)   Constitutional: overweight,  in NAD Eyes: PERRLA, EOMI, no exophthalmos ENT: moist mucous membranes, no thyromegaly, no cervical lymphadenopathy Cardiovascular: RRR, No MRG, + bilateral lower extremity edema, left > right Respiratory: CTA B Gastrointestinal: abdomen soft, NT, ND, BS+ Musculoskeletal: no deformities, strength intact in all 4 Skin: moist, warm, no rashes Neurological: no tremor with outstretched hands, DTR normal in all 4  ASSESSMENT: 1. DM2, insulin-dependent, uncontrolled, with complications - PN - stable, worse in L foot  2. PN  3.  Obesity  PLAN:  1. Patient with history of uncontrolled diabetes, on a complex medication regimen, with oral medicines, basal-bolus insulin regimen and GLP-1 receptor agonist.  At last visit, I advised him to increase Lantus and Humalog.  He is still using the lower doses of these insulins, but he did notice that his sugars improved.  He did not change his diet since last visit.  Reviewing the meter download, his sugars are better in the morning but he does not check them later in the day.  Since they have improved, will continue with the current regimen for now.   - At next visit, if sugars tend to increase, we may need to switch to U500 insulin - I advised him to: Patient Instructions  Please continue: - Metformin 1000 mg 2x a daily - Glipizide 10 mg 2x a day  - Trulicity 1.5 mg weekly - Lantus 20 in AM and 65 units at bedtime - Humalog 25-30 units 3x a day before meals  Please come back for a follow-up appointment in 3 to 4 months.   - today, HbA1c is 7.4% (much better) - continue checking sugars at different times of the day - check 3x a  day, rotating checks - advised for yearly eye exams >> he is UTD - Return to clinic in 3-4 mo with sugar log    2. HL - Reviewed latest lipid panel from 08/2017: LDL at goal, low HDL Lab Results  Component Value Date   CHOL 146 08/31/2017   HDL 34.30 (L) 08/31/2017   LDLCALC 84 08/31/2017   TRIG 137.0 08/31/2017    CHOLHDL 4 08/31/2017  -He is not on a statin, could not tolerate them because of leg cramps.  3.  Obesity - We discussed about the importance of improving diet at every visit including today.  He did not change his diet since last visit, but he did lose 7 pounds since then. -Continue Trulicity, which helps with both diabetes and weight management  Carlus Pavlov, MD PhD [facility]

## 2018-05-25 NOTE — Addendum Note (Signed)
Addended by: Annaleise Burger on: 05/25/2018 11:53 AM   Modules accepted: Orders  

## 2018-05-25 NOTE — Patient Instructions (Signed)
Please continue: - Metformin 1000 mg 2x a daily - Glipizide 10 mg 2x a day  - Trulicity 1.5 mg weekly - Lantus 20 in AM and 65 units at bedtime - Humalog 25-30 units 3x a day before meals

## 2018-06-07 DIAGNOSIS — E119 Type 2 diabetes mellitus without complications: Secondary | ICD-10-CM | POA: Diagnosis not present

## 2018-07-02 ENCOUNTER — Ambulatory Visit (INDEPENDENT_AMBULATORY_CARE_PROVIDER_SITE_OTHER): Payer: PRIVATE HEALTH INSURANCE | Admitting: Family Medicine

## 2018-07-02 ENCOUNTER — Encounter (INDEPENDENT_AMBULATORY_CARE_PROVIDER_SITE_OTHER): Payer: Self-pay | Admitting: Family Medicine

## 2018-07-02 VITALS — BP 121/78 | HR 83

## 2018-07-02 DIAGNOSIS — I10 Essential (primary) hypertension: Secondary | ICD-10-CM | POA: Diagnosis not present

## 2018-07-02 DIAGNOSIS — E1142 Type 2 diabetes mellitus with diabetic polyneuropathy: Secondary | ICD-10-CM

## 2018-07-02 DIAGNOSIS — Z6841 Body Mass Index (BMI) 40.0 and over, adult: Secondary | ICD-10-CM | POA: Diagnosis not present

## 2018-07-02 DIAGNOSIS — M2550 Pain in unspecified joint: Secondary | ICD-10-CM | POA: Diagnosis not present

## 2018-07-02 DIAGNOSIS — R5383 Other fatigue: Secondary | ICD-10-CM | POA: Diagnosis not present

## 2018-07-02 DIAGNOSIS — Z0000 Encounter for general adult medical examination without abnormal findings: Secondary | ICD-10-CM | POA: Diagnosis not present

## 2018-07-02 NOTE — Progress Notes (Signed)
Office Visit Note   Patient: Christian [name]           Date of Birth: 01/08/1951           MRN: [id number] Visit Date: 07/02/2018 Requested by: Hannah Beatopland, [place], MD [address] [place], [place] [id number] PCP: Hannah Beatopland, [place], MD  Subjective: Chief Complaint  Patient presents with  . reestablish primary care    HPI: Here to re-establish care.  Diabetes under good control lately with A1C 7.4.  Mild neuropathy.  Eye exam up to date.  Hypertension controlled.  Multiple joint pain with arthritis.  Complains of chronic fatigue.  No seizures, no gout attacks.               ROS: Otherwise negative.  Objective: Vital Signs: BP 121/78 (BP Location: Right Arm, Patient Position: Sitting, Cuff Size: Large)   Pulse 83   Physical Exam:  HEENT:  [place]/AT, PERRLA, EOM Full, no nystagmus.  Funduscopic examination within normal limits.  No conjunctival erythema.  Tympanic membranes are pearly gray with normal landmarks.  External ear canals are normal.  Nasal passages are clear.  Oropharynx is clear.  No significant lymphadenopathy.  No thyromegaly or nodules.  2+ carotid pulses without bruits. CV: Regular rate and rhythm without murmurs, rubs, or gallops.  1+ peripheral edema.  2+ radial and posterior tibial pulses. Lungs: Clear to auscultation throughout with no wheezing or areas of consolidation. FEET: No suspicious lesions.    Imaging: None  Assessment & Plan: 1.  Diabetes, under better control. - Labs in 6 months.  2.  Hypertension, controlled.  3.  Obesity  4.  Multiple joint pain - Check vitamin D.  5.  Fatigue - Check thyroid, testosterone.    Follow-Up Instructions: No follow-ups on file.     Procedures: None   PMFS History: Patient Active Problem List   Diagnosis Date Noted  . Acute bronchitis 01/16/2018  . Hyperlipidemia associated with type 2 diabetes mellitus (HCC) 09/03/2017  . Diabetic neuropathy associated with type 2 diabetes mellitus  (HCC) 05/25/2017  . Uncontrolled type 2 diabetes mellitus with peripheral neuropathy (HCC) 01/27/2016  . 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 Gout 09/13/2013  . Chronic pain syndrome 09/13/2013  . Osteoarthritis 09/13/2013  . Chronic back pain 09/13/2013  . Morbid obesity with body mass index (BMI) of 50.0 to 59.9 in adult [facility](HCC) 09/13/2013  . Seizures (HCC)   . Hypertension    Past Medical History:  Diagnosis Date  . Chronic back pain 09/13/2013  . Chronic pain syndrome 09/13/2013  . GERD (gastroesophageal reflux disease)    hx. of  . Gout 09/13/2013  . Hemorrhoids   . Hypertension   . Morbid obesity with body mass index of 45.0-49.9 in adult [facility](HCC) 09/13/2013  . Osteoarthritis 09/13/2013  . Pneumonia    hx. of  . Seizures (HCC)   . Sleep apnea    has never used C-pap machine due to insurance cost  . Type 1 diabetes mellitus, uncontrolled (HCC)     Family History  Problem Relation Age of Onset  . Alcohol abuse Father   . Arthritis Maternal Grandmother   . Alcohol abuse Paternal Grandmother   . Alcohol abuse Paternal Grandfather   . Diabetes Maternal Aunt     Past Surgical History:  Procedure Laterality Date  . broken toe 35 years ago    . TOTAL KNEE ARTHROPLASTY Right 05/01/2015   Procedure: RIGHT TOTAL KNEE ARTHROPLASTY;  Surgeon: Cristal Deerhristopher  Aretha Parrot, MD;  Location: [facility];  Service: Orthopedics;  Laterality: Right;  . WISDOM TOOTH EXTRACTION     Social History   Occupational History  . Not on file  Tobacco Use  . Smoking status: Former Smoker    Last attempt to quit: 03/08/2004    Years since quitting: 14.3  . Smokeless tobacco: Never Used  Substance and Sexual Activity  . Alcohol use: No    Comment: rare  . Drug use: No  . Sexual activity: Yes    Partners: Female

## 2018-07-03 ENCOUNTER — Telehealth (INDEPENDENT_AMBULATORY_CARE_PROVIDER_SITE_OTHER): Payer: Self-pay | Admitting: Family Medicine

## 2018-07-03 LAB — CBC WITH DIFFERENTIAL/PLATELET
BASOS PCT: 0.6 %
Basophils Absolute: 61 cells/uL (ref 0–200)
Eosinophils Absolute: 367 cells/uL (ref 15–500)
Eosinophils Relative: 3.6 %
HCT: 37.1 % — ABNORMAL LOW (ref 38.5–50.0)
Hemoglobin: 12.6 g/dL — ABNORMAL LOW (ref 13.2–17.1)
Lymphs Abs: 2060 cells/uL (ref 850–3900)
MCH: 31.1 pg (ref 27.0–33.0)
MCHC: 34 g/dL (ref 32.0–36.0)
MCV: 91.6 fL (ref 80.0–100.0)
MONOS PCT: 6.9 %
MPV: 11.7 fL (ref 7.5–12.5)
NEUTROS PCT: 68.7 %
Neutro Abs: 7007 cells/uL (ref 1500–7800)
PLATELETS: 238 10*3/uL (ref 140–400)
RBC: 4.05 10*6/uL — AB (ref 4.20–5.80)
RDW: 13 % (ref 11.0–15.0)
TOTAL LYMPHOCYTE: 20.2 %
WBC: 10.2 10*3/uL (ref 3.8–10.8)
WBCMIX: 704 {cells}/uL (ref 200–950)

## 2018-07-03 LAB — COMPREHENSIVE METABOLIC PANEL
AG RATIO: 1.1 (calc) (ref 1.0–2.5)
ALBUMIN MSPROF: 3.9 g/dL (ref 3.6–5.1)
ALKALINE PHOSPHATASE (APISO): 73 U/L (ref 40–115)
ALT: 20 U/L (ref 9–46)
AST: 18 U/L (ref 10–35)
BILIRUBIN TOTAL: 0.4 mg/dL (ref 0.2–1.2)
BUN: 13 mg/dL (ref 7–25)
CO2: 24 mmol/L (ref 20–32)
CREATININE: 0.9 mg/dL (ref 0.70–1.25)
Calcium: 9.2 mg/dL (ref 8.6–10.3)
Chloride: 106 mmol/L (ref 98–110)
GLOBULIN: 3.5 g/dL (ref 1.9–3.7)
Glucose, Bld: 87 mg/dL (ref 65–99)
POTASSIUM: 4.6 mmol/L (ref 3.5–5.3)
Sodium: 140 mmol/L (ref 135–146)
Total Protein: 7.4 g/dL (ref 6.1–8.1)

## 2018-07-03 LAB — LIPID PANEL
CHOL/HDL RATIO: 4.1 (calc) (ref <5.0)
Cholesterol: 136 mg/dL (ref <200)
HDL: 33 mg/dL — AB (ref >40)
LDL Cholesterol (Calc): 79 mg/dL (calc)
NON-HDL CHOLESTEROL (CALC): 103 mg/dL (ref <130)
Triglycerides: 143 mg/dL (ref <150)

## 2018-07-03 LAB — THYROID PANEL WITH TSH
Free Thyroxine Index: 2 (ref 1.4–3.8)
T3 Uptake: 38 % — ABNORMAL HIGH (ref 22–35)
T4, Total: 5.3 ug/dL (ref 4.9–10.5)
TSH: 2 mIU/L (ref 0.40–4.50)

## 2018-07-03 LAB — HIGH SENSITIVITY CRP

## 2018-07-03 LAB — TESTOSTERONE TOTAL,FREE,BIO, MALES
ALBUMIN MSPROF: 3.9 g/dL (ref 3.6–5.1)
Sex Hormone Binding: 25 nmol/L (ref 22–77)
Testosterone: 115 ng/dL — ABNORMAL LOW (ref 250–827)

## 2018-07-03 LAB — PSA: PSA: 1 ng/mL (ref <=4.0)

## 2018-07-03 LAB — VITAMIN D 25 HYDROXY (VIT D DEFICIENCY, FRACTURES): VIT D 25 HYDROXY: 11 ng/mL — AB (ref 30–100)

## 2018-07-03 NOTE — Telephone Encounter (Signed)
Labs show:  Testosterone still pending.  Vitamin D is very low at 11.  We want this above 50.  I recommend vitamin D3, 5,000 IU tablets, take 2 daily for 3 months and then 1 daily long-term after that.  Recheck in 6 months.  CRP, inflammation marker, is elevated.  It's non-specific, but is generally improved with exercise and dietary limitation of processed carbohydrates and sweets.  We'll recheck in 4-6 months and if still up, we'll do additional testing.  Hemoglobin is borderline low, suggesting possible anemia.  Would suggest increasing intake of iron-rich foods:  [email or web address]://draxe.com/nutrition/vitamins/[email or web address]-10-iron-rich-foods/  All else looks good.  MJH

## 2018-07-04 ENCOUNTER — Telehealth (INDEPENDENT_AMBULATORY_CARE_PROVIDER_SITE_OTHER): Payer: Self-pay | Admitting: Family Medicine

## 2018-07-04 NOTE — Telephone Encounter (Signed)
Testosterone level is low.  This can contribute to fatigue.  If you're interested in considering treatment (I.e. Topical gel or injections), then we'll need to recheck the levels in a month or two to confirm they're abnormal, then we can usually get insurance to approve.  MJH

## 2018-07-08 ENCOUNTER — Other Ambulatory Visit: Payer: Self-pay | Admitting: Internal Medicine

## 2018-08-01 ENCOUNTER — Other Ambulatory Visit: Payer: Self-pay | Admitting: Family Medicine

## 2018-08-22 ENCOUNTER — Other Ambulatory Visit: Payer: Self-pay | Admitting: Family Medicine

## 2018-09-24 ENCOUNTER — Other Ambulatory Visit: Payer: Self-pay

## 2018-09-24 MED ORDER — INSULIN GLARGINE 100 UNIT/ML SOLOSTAR PEN: PEN_INJECTOR | SUBCUTANEOUS | 5 refills | Status: DC

## 2018-09-25 ENCOUNTER — Encounter: Payer: Self-pay | Admitting: Internal Medicine

## 2018-09-25 ENCOUNTER — Ambulatory Visit (INDEPENDENT_AMBULATORY_CARE_PROVIDER_SITE_OTHER): Payer: PRIVATE HEALTH INSURANCE | Admitting: Internal Medicine

## 2018-09-25 VITALS — BP 120/70 | HR 78 | Ht 66.25 in | Wt 334.0 lb

## 2018-09-25 DIAGNOSIS — E785 Hyperlipidemia, unspecified: Secondary | ICD-10-CM

## 2018-09-25 DIAGNOSIS — E1142 Type 2 diabetes mellitus with diabetic polyneuropathy: Secondary | ICD-10-CM

## 2018-09-25 DIAGNOSIS — E1169 Type 2 diabetes mellitus with other specified complication: Secondary | ICD-10-CM

## 2018-09-25 DIAGNOSIS — Z6841 Body Mass Index (BMI) 40.0 and over, adult: Secondary | ICD-10-CM | POA: Diagnosis not present

## 2018-09-25 DIAGNOSIS — IMO0002 Reserved for concepts with insufficient information to code with codable children: Secondary | ICD-10-CM

## 2018-09-25 DIAGNOSIS — E1165 Type 2 diabetes mellitus with hyperglycemia: Secondary | ICD-10-CM | POA: Diagnosis not present

## 2018-09-25 LAB — POCT GLYCOSYLATED HEMOGLOBIN (HGB A1C): Hemoglobin A1C: 7.7 % — AB (ref 4.0–5.6)

## 2018-09-25 MED ORDER — INSULIN GLARGINE 100 UNIT/ML SOLOSTAR PEN: PEN_INJECTOR | SUBCUTANEOUS | 5 refills | Status: DC

## 2018-09-25 NOTE — Patient Instructions (Addendum)
Please continue: - Metformin 1000 mg 2x a daily - Glipizide 10 mg 2x a day  - Trulicity 1.5 mg weekly - Lantus 20 units in a.m. and 65 units at bedtime  Please increase: - Humalog to 30-35 units 3x a day before meals  Please come back for a follow-up appointment in 3-4 months.

## 2018-09-25 NOTE — Progress Notes (Signed)
Patient ID: SHARROD ACHILLE, male   DOB: 07-23-1951, 67 y.o.   MRN: [id number]  HPI MCCABE GLORIA is a 67 y.o.-year-old male, returning for f/u for DM2, dx 2009, insulin-dependent since 2014, uncontrolled, with complications (Peripheral neuropathy). Last visit 4 months ago. [name] [employer].   He has a hard time with L knee >> can barely walk.  He cannot have TKR because he is the only one driving his family and he usually drives his wife to work.  His wife was out of work before but restarted working 4 days a week.  Since then, he started to eat dinner later, around 8:30 and also eating out more.  Subsequently, sugars are higher.  Last hemoglobin A1c was: Lab Results  Component Value Date   HGBA1C 7.4 (A) 05/25/2018   HGBA1C 9.0 02/22/2018   HGBA1C 9.1 11/23/2017  08/24/2017: HbA1c 9%  Pt is on: - Metformin 1000 mg 2x a daily - Glipizide 10 mg 2x a day  - Trulicity 1.5 mg weekly - Lantus 20 units in a.m. and 65 units at bedtime - Humalog 25-30 units 3x a day before meals  Stopped Jardiance 25 mg >> but too expensive - in the donut hole; had yeast infections. Glipizide and Invokana were started 03/2014.  He has been on Victoza in the past >> not efficient anymore.  Pt checks his sugars once a day  -per his recall: - am:  103, 121-164, 187 >> 103, 150-170, 205 (ate out the night before) - 2h after b'fast: n/c >> 157 >> n/c - before lunch: 220-230 >> 280 >> 125 >> 190-200 - 2h after lunch: 230-235 >> 220-230 >> n/c - before dinner: 180s >> n/c >> 200s >> n/c  - 2h after dinner: n/c  - bedtime:  230s, 318 last night >> n/c  - nighttime: n/c Lowest sugar was 102 >>  103, he has hypoglycemia awareness in the 90s. Highest sugar was 360 (steroids), OTW 200 >> 200s.  Meter: OneTouch Ultra Mini and now [name]  Pt's meals are: - Brunch: dry cereal + raisins + no milk; misc. Fruit (banana/orange); V8 juice;fried egg + cheddar cheese - Dinner: usually frozen meal  - Snacks: 2-3  nabs  -No CKD, last BUN/creatinine:  Lab Results  Component Value Date   BUN 13 07/02/2018   CREATININE 0.90 07/02/2018  She is not on an ACE inhibitor/ARB.  Latest ACR normal: Lab Results  Component Value Date   MICRALBCREAT 1.1 08/31/2017   MICRALBCREAT 0.8 08/20/2015   MICRALBCREAT 0.3 09/11/2013   Latest GFR normal: Lab Results  Component Value Date   GFRNONAA >60 05/02/2015   GFRNONAA >60 04/27/2015   -+ HL; last set of lipids: Lab Results  Component Value Date   CHOL 136 07/02/2018   HDL 33 (L) 07/02/2018   LDLCALC 79 07/02/2018   TRIG 143 07/02/2018   CHOLHDL 4.1 07/02/2018  He could not tolerate statins because of leg cramps. - last eye exam was in 07/2017: + DR - + Numbness/tingling in feet  He had R TKR in 04/2015. He has OSA.  ROS: Constitutional: + weight gain/no weight loss, no fatigue, no subjective hyperthermia, no subjective hypothermia, + nocturia Eyes: no blurry vision, no xerophthalmia ENT: no sore throat, no nodules palpated in neck, no dysphagia, no odynophagia, no hoarseness Cardiovascular: no CP/no SOB/no palpitations/no leg swelling Respiratory: + cough/no SOB/+ wheezing Gastrointestinal: no N/no V/no D/no C/no acid reflux Musculoskeletal: + muscle aches/+ joint aches Skin: no rashes, no  hair loss Neurological: no tremors/+ numbness/+ tingling/no dizziness  I reviewed pt's medications, allergies, PMH, social hx, family hx, and changes were documented in the history of present illness. Otherwise, unchanged from my initial visit note.  Past Medical History:  Diagnosis Date  . Chronic back pain 09/13/2013  . Chronic pain syndrome 09/13/2013  . GERD (gastroesophageal reflux disease)    hx. of  . Gout 09/13/2013  . Hemorrhoids   . Hypertension   . Morbid obesity with body mass index of 45.0-49.9 in adult [facility](HCC) 09/13/2013  . Osteoarthritis 09/13/2013  . Pneumonia    hx. of  . Seizures (HCC)   . Sleep apnea    has never used C-pap machine  due to insurance cost  . Type 1 diabetes mellitus, uncontrolled (HCC)    Past Surgical History:  Procedure Laterality Date  . broken toe 35 years ago    . TOTAL KNEE ARTHROPLASTY Right 05/01/2015   Procedure: RIGHT TOTAL KNEE ARTHROPLASTY;  Surgeon: Kathryne Hitchhristopher Y Blackman, MD;  Location: [facility];  Service: Orthopedics;  Laterality: Right;  . WISDOM TOOTH EXTRACTION     Social History   Socioeconomic History  . Marital status: Married    Spouse name: Not on file  . Number of children: Not on file  . Years of education: Not on file  . Highest education level: Not on file  Occupational History  . Not on file  Social Needs  . Financial resource strain: Not on file  . Food insecurity:    Worry: Not on file    Inability: Not on file  . Transportation needs:    Medical: Not on file    Non-medical: Not on file  Tobacco Use  . Smoking status: Former Smoker    Last attempt to quit: 03/08/2004    Years since quitting: 14.5  . Smokeless tobacco: Never Used  Substance and Sexual Activity  . Alcohol use: No    Comment: rare  . Drug use: No  . Sexual activity: Yes    Partners: Female  Lifestyle  . Physical activity:    Days per week: Not on file    Minutes per session: Not on file  . Stress: Not on file  Relationships  . Social connections:    Talks on phone: Not on file    Gets together: Not on file    Attends religious service: Not on file    Active member of club or organization: Not on file    Attends meetings of clubs or organizations: Not on file    Relationship status: Not on file  . Intimate partner violence:    Fear of current or ex partner: Not on file    Emotionally abused: Not on file    Physically abused: Not on file    Forced sexual activity: Not on file  Other Topics Concern  . 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 aspirin 81  MG tablet Take 81 mg by mouth once.    . bisoprolol-hydrochlorothiazide (ZIAC) 10-6.25 MG tablet TAKE 1 TABLET BY MOUTH  DAILY 90 tablet 1  . diclofenac (VOLTAREN) 75 MG EC tablet TAKE 1 TABLET BY MOUTH TWO  TIMES DAILY 180 tablet 1  . glipiZIDE (GLUCOTROL) 10 MG tablet TAKE 1 TABLET BY MOUTH  TWICE A DAY BEFORE MEALS 180 tablet 1  . glucose  blood (ONE TOUCH TEST STRIPS) test strip Use as instructed two times daily; dx- E11.42, E11.65 200 each 5  . HYDROcodone-acetaminophen (NORCO/VICODIN) 5-325 MG tablet Take 1-2 tablets by mouth every 6 (six) hours as needed for moderate pain. 40 tablet 0  . Insulin Glargine (LANTUS SOLOSTAR) 100 UNIT/ML Solostar Pen Inject 20 units in am and 65 units at night under skin 60 mL 5  . Insulin Lispro (HUMALOG KWIKPEN) 200 UNIT/ML SOPN Inject 25-35 Units into the skin 3 (three) times daily. 15 pen 5  . Insulin Pen Needle (NOVOTWIST) 32G X 5 MM MISC USE 2x a day 100 each 6  . Insulin Pen Needle (NOVOTWIST) 32G X 5 MM MISC USE AS DIRECTED 5 times daily with insulin injections, insulin dependent, E11.65 500 each 5  . metFORMIN (GLUCOPHAGE) 1000 MG tablet Take 1 tablet (1,000 mg total) by mouth 2 (two) times daily with a meal. 180 tablet 1  . oxyCODONE-acetaminophen (PERCOCET/ROXICET) 5-325 MG tablet Take 1 tablet by mouth 2 (two) times daily as needed for severe pain. Reported on 05/03/2016    . predniSONE (DELTASONE) 20 MG tablet Take 2 tablets (40 mg total) by mouth daily. (Patient not taking: Reported on 07/02/2018) 6 tablet 0  . TRULICITY 1.5 MG/0.5ML SOPN INJECT 1.5 MG ONCE WEEKLY 2 pen 3   No current facility-administered medications on file prior to visit.    Allergies  Allergen Reactions  . Penicillins     REACTION: swelling, rash   Family History  Problem Relation Age of Onset  . Alcohol abuse Father   . Arthritis Maternal Grandmother   . Alcohol abuse Paternal Grandmother   . Alcohol abuse Paternal Grandfather   . Diabetes Maternal Aunt     PE: BP  120/70   Pulse 78   Ht 5' 6.25" (1.683 m) Comment: measured  Wt (!) 334 lb (151.5 kg)   SpO2 98%   BMI 53.50 kg/m  Body mass index is 53.5 kg/m.   Wt Readings from Last 3 Encounters:  09/25/18 (!) 334 lb (151.5 kg)  05/25/18 (!) 326 lb 9.6 oz (148.1 kg)  02/22/18 (!) 333 lb 12.8 oz (151.4 kg)   Constitutional: overweight, in NAD Eyes: PERRLA, EOMI, no exophthalmos ENT: moist mucous membranes, no thyromegaly, no cervical lymphadenopathy Cardiovascular: RRR, No MRG, + bilateral lower extremity edema, left > right Respiratory: CTA B Gastrointestinal: abdomen soft, NT, ND, BS+ Musculoskeletal: no deformities, strength intact in all 4 Skin: moist, warm, no rashes Neurological: no tremor with outstretched hands, DTR normal in all 4  ASSESSMENT: 1. DM2, insulin-dependent, uncontrolled, with complications - PN - stable, worse in L foot  2. PN  3.  Obesity  PLAN:  1. Patient with history of uncontrolled diabetes, on a complex medication regimen, with oral medicines, basal-bolus insulin regimen and GLP-1 receptor agonist.  At last visit, sugars were better after he lost 7 pounds, although he did not change his diet.  Sugars predominantly improved in the morning but he was not checking them later in the day.  I advised him to start checking sugars during the day also.  We continued the same regimen at that time especially as his HbA1c was better, at 7.4%. -At this visit, sugars are higher before breakfast and lunch.  He is not checking sugars in the evening and I strongly advised him to do so, alternating before dinner with after dinner.  We also discussed about correct intake of Humalog, 15 minutes before every meal.  He now takes it  at the start of the meal or after. -I also advised him to increase the Humalog especially with the holidays coming up by 5 to 10 units, depending on the size of his meal.  He is aware that he needs to back off the doses as his sugars improve -In the future, we  may need to switch to U500 insulin - I advised him to: Patient Instructions  Please continue: - Metformin 1000 mg 2x a daily - Glipizide 10 mg 2x a day  - Trulicity 1.5 mg weekly - Lantus 20 units in a.m. and 65 units at bedtime  Please increase: - Humalog to 30-35 units 3x a day before meals  Please come back for a follow-up appointment in 3-4 months.  - today, HbA1c is 7.7% (higher) - continue checking sugars at different times of the day - check 3x a day, rotating checks - advised for yearly eye exams >> he is not UTD - Return to clinic in 3-4 mo with sugar log     2. HL - Reviewed latest lipid panel from 06/2018: LDL at goal, low HDL Lab Results  Component Value Date   CHOL 136 07/02/2018   HDL 33 (L) 07/02/2018   LDLCALC 79 07/02/2018   TRIG 143 07/02/2018   CHOLHDL 4.1 07/02/2018  -He cannot tolerate statins due to leg cramps.  3.  Obesity -We discussed multiple times in the past and again today about the importance of improving his diet.   He did lose 7 pounds before last visit but gained them back since then after he started to eat late at night and also eating out more.  He is also less active due to knee pain. -continue Trulicity which should also help with weight loss  Carlus Pavlov, MD PhD [facility]

## 2018-10-10 ENCOUNTER — Telehealth: Payer: Self-pay | Admitting: Endocrinology

## 2018-10-10 NOTE — Telephone Encounter (Signed)
Deneisha from Pharmacy requesting a prior auth form for Humalog.    prior auth form for Humalog is located at  [email or web address]

## 2018-10-11 NOTE — Telephone Encounter (Signed)
No.  I am just now seeing this, it is under another provider. I will try to work on it tomorrow, our electronic RX system is not working so there is a huge paper back log.

## 2018-10-11 NOTE — Telephone Encounter (Signed)
Please advise patient if this has been done

## 2018-10-11 NOTE — Telephone Encounter (Signed)
Routing...

## 2018-10-16 ENCOUNTER — Other Ambulatory Visit: Payer: Self-pay | Admitting: Internal Medicine

## 2018-10-17 ENCOUNTER — Telehealth: Payer: Self-pay | Admitting: Endocrinology

## 2018-10-17 NOTE — Telephone Encounter (Signed)
Patient is inquiring about paperwork that needs to be done for there Humalog. Please Advise, thanks

## 2018-10-17 NOTE — Telephone Encounter (Signed)
Do you know the status of his PA? It looks like somehow this PA was routed to you and Misty StanleyLisa when it should have come to me originally. If I need to work on it, let me know. Thanks

## 2018-10-22 NOTE — Telephone Encounter (Signed)
I am not aware of any paperwork for this patient

## 2018-11-06 ENCOUNTER — Telehealth: Payer: Self-pay

## 2018-11-06 ENCOUNTER — Telehealth: Payer: Self-pay | Admitting: Endocrinology

## 2018-11-06 ENCOUNTER — Telehealth: Payer: Self-pay | Admitting: Internal Medicine

## 2018-11-06 NOTE — Telephone Encounter (Signed)
Please note that this enounter as well as the last few have been created for the wrong doctor, it should be Gherghe not Everardo AllEllison.  Contacted Raven and she is faxing the request.

## 2018-11-06 NOTE — Telephone Encounter (Signed)
Raven called from [employer] and is following up on a PA for Humolog.  Is requesting that that an Urgent PA request is sent to them regarding this Rx.  She stated the website could be used.  The website is [email or web address].   Ravens umber for a call back is [phone]

## 2018-11-06 NOTE — Telephone Encounter (Signed)
Insulin Lispro (HUMALOG KWIKPEN) 200 UNIT/ML SOPN  Patient stated that the insurance company will not cover this medication unless she fills out the forms that were faxed over to the office.  Patient said this has been going on since before thanksgiving.    Please advise

## 2018-11-06 NOTE — Telephone Encounter (Signed)
We have not receive any forms.  We submitted the PA to his insurance, they canceled it because they do not handle pharmacy benefits.  See other phone note.

## 2018-11-06 NOTE — Telephone Encounter (Signed)
Patient has been having trouble getting the PA for Humalog done, several phone notes created under the wrong doctor.  Please do not send to Dr. Everardo AllEllison, this is a Dr. Elvera LennoxGherghe patient.  I am waiting for the fax.

## 2018-11-08 ENCOUNTER — Telehealth: Payer: Self-pay

## 2018-11-08 MED ORDER — INSULIN LISPRO 200 UNIT/ML ~~LOC~~ SOPN: 25.0000 [IU] | PEN_INJECTOR | Freq: Three times a day (TID) | SUBCUTANEOUS | 4 refills | Status: DC

## 2018-11-08 NOTE — Telephone Encounter (Signed)
PA approved for one year, RX sent.

## 2018-11-08 NOTE — Telephone Encounter (Signed)
PA for humalog has been filled out and faxed.

## 2018-11-13 ENCOUNTER — Ambulatory Visit (INDEPENDENT_AMBULATORY_CARE_PROVIDER_SITE_OTHER): Payer: PRIVATE HEALTH INSURANCE

## 2018-11-13 ENCOUNTER — Ambulatory Visit (INDEPENDENT_AMBULATORY_CARE_PROVIDER_SITE_OTHER): Payer: PRIVATE HEALTH INSURANCE | Admitting: Orthopaedic Surgery

## 2018-11-13 ENCOUNTER — Encounter (INDEPENDENT_AMBULATORY_CARE_PROVIDER_SITE_OTHER): Payer: Self-pay | Admitting: Orthopaedic Surgery

## 2018-11-13 ENCOUNTER — Telehealth (INDEPENDENT_AMBULATORY_CARE_PROVIDER_SITE_OTHER): Payer: Self-pay | Admitting: Orthopaedic Surgery

## 2018-11-13 DIAGNOSIS — M25572 Pain in left ankle and joints of left foot: Secondary | ICD-10-CM

## 2018-11-13 DIAGNOSIS — M1712 Unilateral primary osteoarthritis, left knee: Secondary | ICD-10-CM

## 2018-11-13 DIAGNOSIS — M76822 Posterior tibial tendinitis, left leg: Secondary | ICD-10-CM | POA: Diagnosis not present

## 2018-11-13 MED ORDER — METHYLPREDNISOLONE ACETATE 40 MG/ML IJ SUSP
40.0000 mg | INTRAMUSCULAR | Status: AC | PRN
  Administered 2018-11-13: 40 mg via INTRA_ARTICULAR

## 2018-11-13 MED ORDER — OXYCODONE-ACETAMINOPHEN 5-325 MG PO TABS: 1.0000 | ORAL_TABLET | Freq: Two times a day (BID) | ORAL | 0 refills | Status: DC | PRN

## 2018-11-13 MED ORDER — LIDOCAINE HCL 1 % IJ SOLN
3.0000 mL | INTRAMUSCULAR | Status: AC | PRN
  Administered 2018-11-13: 3 mL

## 2018-11-13 NOTE — Progress Notes (Addendum)
Office Visit Note   Patient: Christian [name]           Date of Birth: 04-16-1951           MRN: [id number] Visit Date: 11/13/2018              Requested by: Lavada Mesi, MD [address] [place], [place] [address] PCP: Lavada Mesi, MD   Assessment & Plan: Visit Diagnoses:  1. Pain in left ankle and joints of left foot   2. Posterior tibial tendon dysfunction (PTTD) of left lower extremity   3. Unilateral primary osteoarthritis, left knee     Plan: We will place him in a CAM walker boot left lower extremity weight bearing as tolerated for 2 weeks then transition to a regular shoe with an arch support as tolerated.  Offered formal physical therapy for posterior tibial tendon rehab he defers.  We will see him back in a month to check his progress lack of.  He did discuss some posterior tibial exercises he can perform.  In regards to his left knee he understands that he can have cortisone injections no more often than every 3 months.  Supplemental injections no more often than every 6 months.  Follow-Up Instructions: No follow-ups on file.   Orders:  Orders Placed This Encounter  Procedures  . Large Joint Inj  . XR Ankle Complete Left   No orders of the defined types were placed in this encounter.     Procedures: Large Joint Inj: L knee on 11/13/2018 11:50 AM Indications: pain Details: 22 G 1.5 in needle, anterolateral approach  Arthrogram: No  Medications: 3 mL lidocaine 1 %; 40 mg methylPREDNISolone acetate 40 MG/ML Outcome: tolerated well, no immediate complications Procedure, treatment alternatives, risks and benefits explained, specific risks discussed. Consent was given by the patient. Immediately prior to procedure a time out was called to verify the correct patient, procedure, equipment, support staff and site/side marked as required. Patient was prepped and draped in the usual sterile fashion.       Clinical Data: No additional  findings.   Subjective: Chief Complaint  Patient presents with  . Left Knee - Pain  . Left Ankle - Injury    HPI Christian [name] comes in today due to left knee pain.  He has known arthritis of the left knee.  He is requesting a cortisone injection.  He last was given a Synvisc 1 injection 05/02/2018 in the left knee which did help some.  He reports on Christmas Day he was going out a new deck at his home and his left knee gave way causing fall on his left side injuring his left ankle.  He has had left ankle pain for prolonged period of time and notes he has had some swelling of the ankle but his pain is worse now.  Pain is only with weightbearing.  He is recently got new shoes and inserts with the medial hindfoot wedge from the good feet store. Review of Systems Please see HPI otherwise negative  Objective: Vital Signs: There were no vitals taken for this visit.  Physical Exam General: Well-developed well-nourished male in no acute distress. Psych alert and oriented x3  Ortho Exam Left ankle good range of motion.  No effusion abnormal warmth erythema of the left knee. Left ankle nontender throughout.  He has weakness with inversion against resistance of the left foot.  Eversion 5 out of 5 strength left foot against resistance.  He is able  to do a single heel raise on the right unable to perform this on the left.  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 He is nontender over his left posterior tibial tendon and peroneal tendons.  Dorsiflexion plantarflexion ankle intact.  No abnormal warmth erythema impending ulcers left foot ankle.  Achilles is nontender intact calf supple nontender. Specialty Comments:  No specialty comments available.  Imaging: Xr Ankle Complete Left  Result Date: 11/13/2018 3 VIEWS LEFT ANKLE : No acute fracture.  Talus well located within the ankle mortise no diastases.  Arthrosclerosis of the posterior tibial vessel.    PMFS History: Patient  Active Problem List   Diagnosis Date Noted  . Acute bronchitis 01/16/2018  . Hyperlipidemia associated with type 2 diabetes mellitus (HCC) 09/03/2017  . Diabetic neuropathy associated with type 2 diabetes mellitus (HCC) 05/25/2017  . Uncontrolled type 2 diabetes mellitus with peripheral neuropathy (HCC) 01/27/2016  . 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 Gout 09/13/2013  . Chronic pain syndrome 09/13/2013  . Osteoarthritis 09/13/2013  . Chronic back pain 09/13/2013  . Morbid obesity with body mass index (BMI) of 50.0 to 59.9 in adult [facility]) 09/13/2013  . Seizures (HCC)   . Hypertension    Past Medical History:  Diagnosis Date  . Chronic back pain 09/13/2013  . Chronic pain syndrome 09/13/2013  . GERD (gastroesophageal reflux disease)    hx. of  . Gout 09/13/2013  . Hemorrhoids   . Hypertension   . Morbid obesity with body mass index of 45.0-49.9 in adult [facility]) 09/13/2013  . Osteoarthritis 09/13/2013  . Pneumonia    hx. of  . Seizures (HCC)   . Sleep apnea    has never used C-pap machine due to insurance cost  . Type 1 diabetes mellitus, uncontrolled (HCC)     Family History  Problem Relation Age of Onset  . Alcohol abuse Father   . Arthritis Maternal Grandmother   . Alcohol abuse Paternal Grandmother   . Alcohol abuse Paternal Grandfather   . Diabetes Maternal Aunt     Past Surgical History:  Procedure Laterality Date  . broken toe 35 years ago    . TOTAL KNEE ARTHROPLASTY Right 05/01/2015   Procedure: RIGHT TOTAL KNEE ARTHROPLASTY;  Surgeon: Kathryne Hitch, MD;  Location: [facility];  Service: Orthopedics;  Laterality: Right;  . WISDOM TOOTH EXTRACTION     Social History   Occupational History  . Not on file  Tobacco Use  . Smoking status: Former Smoker    Last attempt to quit: 03/08/2004    Years since quitting: 14.6  . Smokeless tobacco: Never Used  Substance and Sexual Activity  . Alcohol use: No    Comment: rare  . Drug use: No  . Sexual  activity: Yes    Partners: Female

## 2018-11-13 NOTE — Telephone Encounter (Signed)
Please advise 

## 2018-11-13 NOTE — Telephone Encounter (Signed)
Pt wife came in asking if refill on his percocet. Pt # [phone]

## 2018-11-16 ENCOUNTER — Other Ambulatory Visit: Payer: Self-pay | Admitting: Internal Medicine

## 2018-12-11 ENCOUNTER — Encounter (INDEPENDENT_AMBULATORY_CARE_PROVIDER_SITE_OTHER): Payer: Self-pay | Admitting: Orthopaedic Surgery

## 2018-12-11 ENCOUNTER — Ambulatory Visit (INDEPENDENT_AMBULATORY_CARE_PROVIDER_SITE_OTHER): Payer: PRIVATE HEALTH INSURANCE | Admitting: Orthopaedic Surgery

## 2018-12-11 DIAGNOSIS — M25512 Pain in left shoulder: Secondary | ICD-10-CM | POA: Diagnosis not present

## 2018-12-11 MED ORDER — METHYLPREDNISOLONE ACETATE 40 MG/ML IJ SUSP
40.0000 mg | INTRAMUSCULAR | Status: AC | PRN
  Administered 2018-12-11: 40 mg via INTRA_ARTICULAR

## 2018-12-11 MED ORDER — LIDOCAINE HCL 1 % IJ SOLN
3.0000 mL | INTRAMUSCULAR | Status: AC | PRN
  Administered 2018-12-11: 3 mL

## 2018-12-11 NOTE — Progress Notes (Signed)
Office Visit Note   Patient: Christian [name]           Date of Birth: 03/26/1951           MRN: [id number] Visit Date: 12/11/2018              Requested by: Lavada Mesi, MD [address] [place], [place] [address] PCP: Lavada Mesi, MD   Assessment & Plan: Visit Diagnoses:  1. Acute pain of left shoulder     Plan: I did provide a steroid injection in the subacromial outlet of his left shoulder today and this gave him significant relief.  I want him to use the shoulder as much as possible.  He actually sees Dr. Prince Rome as a primary care physician visit at the end of the month.  Certainly that visit he can have plain films of his left shoulder if he still having issues and Dr. Prince Rome can likely ultrasound the shoulder to determine whether or not there is deficiency in the rotator cuff.  Follow-Up Instructions: Return if symptoms worsen or fail to improve.   Orders:  Orders Placed This Encounter  Procedures  . Large Joint Inj   No orders of the defined types were placed in this encounter.     Procedures: Large Joint Inj: L subacromial bursa on 12/11/2018 11:07 AM Indications: pain and diagnostic evaluation Details: 22 G 1.5 in needle  Arthrogram: No  Medications: 3 mL lidocaine 1 %; 40 mg methylPREDNISolone acetate 40 MG/ML Outcome: tolerated well, no immediate complications Procedure, treatment alternatives, risks and benefits explained, specific risks discussed. Consent was given by the patient. Immediately prior to procedure a time out was called to verify the correct patient, procedure, equipment, support staff and site/side marked as required. Patient was prepped and draped in the usual sterile fashion.       Clinical Data: No additional findings.   Subjective: Chief Complaint  Patient presents with  . Left Knee - Follow-up  The patient was mainly coming in for his left knee and follow-up after steroid injection.  He still is morbidly obese and does have  arthritis in that knee but we have been hesitant to perform a knee replacement surgery due to the significant weight that he has on his body.  He did have a fall recently and he is injured his left shoulder.  We have not x-rayed in a while and we did not get x-rays today but he start with overhead activities.  And his left shoulder is also felt weak.  HPI  Review of Systems He currently denies any headache or chest pain, he denies any fever, chills, nausea, vomiting.  Objective: Vital Signs: There were no vitals taken for this visit.  Physical Exam He is alert and orient x3 and in no acute distress Ortho Exam Examination of his left shoulder does show some deficiency of the rotator cuff.  He seems to be abducting his shoulder using more of his deltoids.  There is clicking and grinding in the shoulder itself on the left side. Specialty Comments:  No specialty comments available.  Imaging: No results found.   PMFS History: Patient Active Problem List   Diagnosis Date Noted  . Acute bronchitis 01/16/2018  . Hyperlipidemia associated with type 2 diabetes mellitus (HCC) 09/03/2017  . Diabetic neuropathy associated with type 2 diabetes mellitus (HCC) 05/25/2017  . Uncontrolled type 2 diabetes mellitus with peripheral neuropathy (HCC) 01/27/2016  . 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 Gout  09/13/2013  . Chronic pain syndrome 09/13/2013  . Osteoarthritis 09/13/2013  . Chronic back pain 09/13/2013  . Morbid obesity with body mass index (BMI) of 50.0 to 59.9 in adult [facility]) 09/13/2013  . Seizures (HCC)   . Hypertension    Past Medical History:  Diagnosis Date  . Chronic back pain 09/13/2013  . Chronic pain syndrome 09/13/2013  . GERD (gastroesophageal reflux disease)    hx. of  . Gout 09/13/2013  . Hemorrhoids   . Hypertension   . Morbid obesity with body mass index of 45.0-49.9 in adult [facility]) 09/13/2013  . Osteoarthritis 09/13/2013  . Pneumonia    hx. of  .  Seizures (HCC)   . Sleep apnea    has never used C-pap machine due to insurance cost  . Type 1 diabetes mellitus, uncontrolled (HCC)     Family History  Problem Relation Age of Onset  . Alcohol abuse Father   . Arthritis Maternal Grandmother   . Alcohol abuse Paternal Grandmother   . Alcohol abuse Paternal Grandfather   . Diabetes Maternal Aunt     Past Surgical History:  Procedure Laterality Date  . broken toe 35 years ago    . TOTAL KNEE ARTHROPLASTY Right 05/01/2015   Procedure: RIGHT TOTAL KNEE ARTHROPLASTY;  Surgeon: Kathryne Hitch, MD;  Location: [facility];  Service: Orthopedics;  Laterality: Right;  . WISDOM TOOTH EXTRACTION     Social History   Occupational History  . Not on file  Tobacco Use  . Smoking status: Former Smoker    Last attempt to quit: 03/08/2004    Years since quitting: 14.7  . Smokeless tobacco: Never Used  Substance and Sexual Activity  . Alcohol use: No    Comment: rare  . Drug use: No  . Sexual activity: Yes    Partners: Female

## 2018-12-16 ENCOUNTER — Other Ambulatory Visit: Payer: Self-pay | Admitting: Internal Medicine

## 2019-01-02 ENCOUNTER — Encounter (INDEPENDENT_AMBULATORY_CARE_PROVIDER_SITE_OTHER): Payer: Self-pay | Admitting: Family Medicine

## 2019-01-02 ENCOUNTER — Ambulatory Visit (INDEPENDENT_AMBULATORY_CARE_PROVIDER_SITE_OTHER): Payer: PRIVATE HEALTH INSURANCE | Admitting: Family Medicine

## 2019-01-02 ENCOUNTER — Other Ambulatory Visit (INDEPENDENT_AMBULATORY_CARE_PROVIDER_SITE_OTHER): Payer: Self-pay | Admitting: Family Medicine

## 2019-01-02 ENCOUNTER — Other Ambulatory Visit (INDEPENDENT_AMBULATORY_CARE_PROVIDER_SITE_OTHER): Payer: Self-pay

## 2019-01-02 VITALS — BP 133/77 | HR 70 | Temp 98.0°F | Ht 66.25 in | Wt 348.8 lb

## 2019-01-02 DIAGNOSIS — D649 Anemia, unspecified: Secondary | ICD-10-CM

## 2019-01-02 DIAGNOSIS — I10 Essential (primary) hypertension: Secondary | ICD-10-CM | POA: Diagnosis not present

## 2019-01-02 DIAGNOSIS — E559 Vitamin D deficiency, unspecified: Secondary | ICD-10-CM

## 2019-01-02 DIAGNOSIS — Z6841 Body Mass Index (BMI) 40.0 and over, adult: Secondary | ICD-10-CM | POA: Diagnosis not present

## 2019-01-02 DIAGNOSIS — R7982 Elevated C-reactive protein (CRP): Secondary | ICD-10-CM

## 2019-01-02 DIAGNOSIS — G8929 Other chronic pain: Secondary | ICD-10-CM

## 2019-01-02 DIAGNOSIS — M25512 Pain in left shoulder: Secondary | ICD-10-CM | POA: Diagnosis not present

## 2019-01-02 DIAGNOSIS — M2550 Pain in unspecified joint: Secondary | ICD-10-CM

## 2019-01-02 MED ORDER — OXYCODONE-ACETAMINOPHEN 5-325 MG PO TABS: 1.0000 | ORAL_TABLET | Freq: Two times a day (BID) | ORAL | 0 refills | Status: DC | PRN

## 2019-01-02 NOTE — Progress Notes (Signed)
Office Visit Note   Patient: Christian [name]           Date of Birth: 01-12-1951           MRN: [id number] Visit Date: 01/02/2019 Requested by: Hannah Beat, MD [address] [place], [place] [address] PCP: Lavada Mesi, MD  Subjective: Chief Complaint  Patient presents with  . Left Shoulder - Pain    ? [place], had cortisone injection by  Dr. Magnus Ivan 2 weeks ago.  . 6 months followup/bloodwork    HPI: He is here for routine monitoring of medical conditions.  He is also here with persistent left shoulder pain.  Blood pressure standpoint his numbers look good when he checks it at home.  He thinks his A1c will be better this time, he is scheduled to meet with his endocrinologist in the next couple days.  Labs last time were notable for vitamin D deficiency, anemia, and elevated C-reactive protein.  He continues to gain weight unfortunately.  He admits to dietary indiscretions.  In the past he was diagnosed with sleep apnea but his insurance would not cover supplies.  He still thinks he might have it but he does not think he would tolerate the treatment so he has not interested in pursuing it.  Dr. Magnus Ivan injected his left shoulder last visit but it did not help unfortunately.  He does note that he has had chronic pain in his shoulder.              ROS: Otherwise noncontributory.  Objective: Vital Signs: BP 133/77 (BP Location: Right Arm, Patient Position: Sitting, Cuff Size: Large)   Pulse 70   Temp 98 F (36.7 C)   Ht 5' 6.25" (1.683 m)   Wt (!) 348 lb 12.8 oz (158.2 kg)   BMI 55.87 kg/m   Physical Exam:  HEENT:  [place]/AT, PERRLA, EOM Full, no nystagmus.  Funduscopic examination within normal limits.  No conjunctival erythema.  Tympanic membranes are pearly gray with normal landmarks.  External ear canals are normal.  Nasal passages are clear.  Oropharynx is clear.  No significant lymphadenopathy.  No thyromegaly or nodules.  2+ carotid pulses without  bruits. CV: Regular rate and rhythm without murmurs, rubs, or gallops.  No peripheral edema.  2+ radial and posterior tibial pulses. Lungs: Clear to auscultation throughout with no wheezing or areas of consolidation. Left shoulder: 4/5 weakness with empty can test.  Good strength with internal/external rotation.  Moderately tender in the lateral subacromial space.   Imaging: Ultrasound left shoulder: Long head biceps tendon is located in its groove but has some tendon sheath swelling.  Subscapularis overall appears intact.  Supraspinatus appears to be chronically torn and retracted.  Infraspinatus is diminutive but still appears to be intact.  Assessment & Plan: 1.  Hypertension, well controlled. -Call for refills when needed.  Follow-up in 6 months.  2.  Diabetes, scheduled to see endocrinologist this week.  3.  Morbid obesity -He understands the importance of healthy eating and weight loss.  4.  Vitamin D deficiency -Recheck levels today.  5.  Elevated C-reactive protein -Recheck level today.  6.  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  If symptoms worsen could inject the glenohumeral joint although I do not think that would give long-term relief.  He may at some point need reverse total shoulder.     Procedures: No procedures performed  No notes on file  PMFS History: Patient Active Problem List   Diagnosis Date Noted  . Vitamin D deficiency 01/02/2019  . Acute bronchitis 01/16/2018  . Hyperlipidemia associated with type 2 diabetes mellitus (HCC) 09/03/2017  . Diabetic neuropathy associated with type 2 diabetes mellitus (HCC) 05/25/2017  . Uncontrolled type 2 diabetes mellitus with peripheral neuropathy (HCC) 01/27/2016  . 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 Gout 09/13/2013  . Chronic pain syndrome 09/13/2013  . Osteoarthritis 09/13/2013  . Chronic back pain 09/13/2013  . Morbid  obesity with body mass index (BMI) of 50.0 to 59.9 in adult [facility]) 09/13/2013  . Seizures (HCC)   . Hypertension    Past Medical History:  Diagnosis Date  . Chronic back pain 09/13/2013  . Chronic pain syndrome 09/13/2013  . GERD (gastroesophageal reflux disease)    hx. of  . Gout 09/13/2013  . Hemorrhoids   . Hypertension   . Morbid obesity with body mass index of 45.0-49.9 in adult [facility]) 09/13/2013  . Osteoarthritis 09/13/2013  . Pneumonia    hx. of  . Seizures (HCC)   . Sleep apnea    has never used C-pap machine due to insurance cost  . Type 1 diabetes mellitus, uncontrolled (HCC)     Family History  Problem Relation Age of Onset  . Alcohol abuse Father   . Arthritis Maternal Grandmother   . Alcohol abuse Paternal Grandmother   . Alcohol abuse Paternal Grandfather   . Diabetes Maternal Aunt     Past Surgical History:  Procedure Laterality Date  . broken toe 35 years ago    . TOTAL KNEE ARTHROPLASTY Right 05/01/2015   Procedure: RIGHT TOTAL KNEE ARTHROPLASTY;  Surgeon: Kathryne Hitch, MD;  Location: [facility];  Service: Orthopedics;  Laterality: Right;  . WISDOM TOOTH EXTRACTION     Social History   Occupational History  . Not on file  Tobacco Use  . Smoking status: Former Smoker    Last attempt to quit: 03/08/2004    Years since quitting: 14.8  . Smokeless tobacco: Never Used  Substance and Sexual Activity  . Alcohol use: No    Comment: rare  . Drug use: No  . Sexual activity: Yes    Partners: Female

## 2019-01-03 LAB — COMPREHENSIVE METABOLIC PANEL
AG Ratio: 1.2 (calc) (ref 1.0–2.5)
ALT: 25 U/L (ref 9–46)
AST: 24 U/L (ref 10–35)
Albumin: 3.9 g/dL (ref 3.6–5.1)
Alkaline phosphatase (APISO): 74 U/L (ref 35–144)
BUN: 20 mg/dL (ref 7–25)
CO2: 24 mmol/L (ref 20–32)
Calcium: 9.3 mg/dL (ref 8.6–10.3)
Chloride: 102 mmol/L (ref 98–110)
Creat: 0.82 mg/dL (ref 0.70–1.25)
Globulin: 3.3 g/dL (calc) (ref 1.9–3.7)
Glucose, Bld: 113 mg/dL — ABNORMAL HIGH (ref 65–99)
Potassium: 4.7 mmol/L (ref 3.5–5.3)
Sodium: 137 mmol/L (ref 135–146)
TOTAL PROTEIN: 7.2 g/dL (ref 6.1–8.1)
Total Bilirubin: 0.4 mg/dL (ref 0.2–1.2)

## 2019-01-03 LAB — CBC WITH DIFFERENTIAL/PLATELET
Absolute Monocytes: 762 cells/uL (ref 200–950)
Basophils Absolute: 67 cells/uL (ref 0–200)
Basophils Relative: 0.6 %
Eosinophils Absolute: 325 cells/uL (ref 15–500)
Eosinophils Relative: 2.9 %
HCT: 37.4 % — ABNORMAL LOW (ref 38.5–50.0)
Hemoglobin: 12.4 g/dL — ABNORMAL LOW (ref 13.2–17.1)
Lymphs Abs: 2150 cells/uL (ref 850–3900)
MCH: 30.8 pg (ref 27.0–33.0)
MCHC: 33.2 g/dL (ref 32.0–36.0)
MCV: 92.8 fL (ref 80.0–100.0)
MPV: 11.6 fL (ref 7.5–12.5)
Monocytes Relative: 6.8 %
Neutro Abs: 7896 cells/uL — ABNORMAL HIGH (ref 1500–7800)
Neutrophils Relative %: 70.5 %
Platelets: 249 10*3/uL (ref 140–400)
RBC: 4.03 10*6/uL — ABNORMAL LOW (ref 4.20–5.80)
RDW: 12.7 % (ref 11.0–15.0)
Total Lymphocyte: 19.2 %
WBC: 11.2 10*3/uL — ABNORMAL HIGH (ref 3.8–10.8)

## 2019-01-03 LAB — IRON,TIBC AND FERRITIN PANEL
%SAT: 18 % (calc) — ABNORMAL LOW (ref 20–48)
Ferritin: 26 ng/mL (ref 24–380)
Iron: 70 ug/dL (ref 50–180)
TIBC: 397 mcg/dL (calc) (ref 250–425)

## 2019-01-03 LAB — HIGH SENSITIVITY CRP: hs-CRP: >10 mg/L — ABNORMAL HIGH

## 2019-01-03 LAB — VITAMIN D 25 HYDROXY (VIT D DEFICIENCY, FRACTURES): Vit D, 25-Hydroxy: 14 ng/mL — ABNORMAL LOW (ref 30–100)

## 2019-01-04 ENCOUNTER — Telehealth (INDEPENDENT_AMBULATORY_CARE_PROVIDER_SITE_OTHER): Payer: Self-pay | Admitting: Family Medicine

## 2019-01-04 NOTE — Telephone Encounter (Signed)
Labs show:  Vitamin D still very low at 14 (target is 50-80).  I recommend OTC vitamin D3, 5000 IU tablets, two daily for the next 4-6 months and then recheck levels to see if we can switch to lower maintenance dose.  CRP, inflammation marker, remains very high.  Often this will improve with dietary limitation of processed carbohydrates and sweets. Recheck in 4-6 months.  Ferritin is low at 26, suggesting iron-deficiency.  I recommend increasing intake of iron-rich foods:  [email or web address]://draxe.com/nutrition/top-10-iron-rich-foods/  Other labs look ok.

## 2019-01-04 NOTE — Telephone Encounter (Signed)
I advised the patient he has results in MyChart. He said he will look at this.  I told him to let us know if he has any questions/concerns.

## 2019-01-08 ENCOUNTER — Ambulatory Visit (INDEPENDENT_AMBULATORY_CARE_PROVIDER_SITE_OTHER): Payer: PRIVATE HEALTH INSURANCE | Admitting: Internal Medicine

## 2019-01-08 ENCOUNTER — Other Ambulatory Visit: Payer: Self-pay

## 2019-01-08 ENCOUNTER — Encounter: Payer: Self-pay | Admitting: Internal Medicine

## 2019-01-08 VITALS — BP 138/60 | HR 74 | Ht 66.25 in | Wt 345.0 lb

## 2019-01-08 DIAGNOSIS — E1169 Type 2 diabetes mellitus with other specified complication: Secondary | ICD-10-CM

## 2019-01-08 DIAGNOSIS — E785 Hyperlipidemia, unspecified: Secondary | ICD-10-CM | POA: Diagnosis not present

## 2019-01-08 DIAGNOSIS — Z6841 Body Mass Index (BMI) 40.0 and over, adult: Secondary | ICD-10-CM

## 2019-01-08 DIAGNOSIS — E1165 Type 2 diabetes mellitus with hyperglycemia: Secondary | ICD-10-CM | POA: Diagnosis not present

## 2019-01-08 DIAGNOSIS — E1142 Type 2 diabetes mellitus with diabetic polyneuropathy: Secondary | ICD-10-CM | POA: Diagnosis not present

## 2019-01-08 DIAGNOSIS — IMO0002 Reserved for concepts with insufficient information to code with codable children: Secondary | ICD-10-CM

## 2019-01-08 LAB — POCT GLYCOSYLATED HEMOGLOBIN (HGB A1C): Hemoglobin A1C: 8.2 % — AB (ref 4.0–5.6)

## 2019-01-08 MED ORDER — METFORMIN HCL 1000 MG PO TABS: 1000.0000 mg | ORAL_TABLET | Freq: Two times a day (BID) | ORAL | 3 refills | Status: DC

## 2019-01-08 NOTE — Patient Instructions (Addendum)
Please continue: - Metformin 1000 mg 2x a daily - Glipizide 10 mg 2x a day  - Trulicity 1.5 mg weekly - Lantus 20 units in a.m. and 65 units at bedtime - Humalog 30-35 units 3x a day before meals  Please schedule an appt with Oran Rein with nutrition.  Please come back for a follow-up appointment in 4 months.

## 2019-01-08 NOTE — Addendum Note (Signed)
Addended by: Darliss Ridgel I on: 01/08/2019 01:24 PM   Modules accepted: Orders

## 2019-01-08 NOTE — Progress Notes (Signed)
Patient ID: Christian [name], male   DOB: [birth year], 68 y.o.   MRN: [id number]  HPI DONTAI [name] is a 68 y.o.-year-old male, returning for f/u for DM2, dx 2009, insulin-dependent since 2014, uncontrolled, with complications (DR, Peripheral neuropathy). Last visit 3.5 months ago. [name] [employer].   He had 2 recent steroid inj in L knee and L shoulder.  Now uses CBD oil liniment and this helps.  Last hemoglobin A1c was: Lab Results  Component Value Date   HGBA1C 7.7 (A) 09/25/2018   HGBA1C 7.4 (A) 05/25/2018   HGBA1C 9.0 02/22/2018  08/24/2017: HbA1c 9%  Pt is on: - Metformin 1000 mg 2x a daily - Glipizide 10 mg 2x a day  - Trulicity 1.5 mg weekly - Lantus 20 units in a.m. and 65 units at bedtime - Humalog 30-35 units 3x a day before meals  Stopped Jardiance 25 mg >> but too expensive - in the donut hole; had yeast infections. Glipizide and Invokana were started 03/2014.  He has been on Victoza in the past >> not efficient anymore.  Pt checks his sugars 0-1x a day: - am:   103, 150-170, 205 >> 120-199, 236 - 2h after b'fast: n/c >> 157 >> n/c - before lunch: 280 >> 125 >> 190-200 >> n/c - 2h after lunch: 230-235 >> 220-230 >> n/c - before dinner: 180s >> n/c >> 200s >> n/c  - 2h after dinner: n/c  - bedtime:  230s, 318 >> n/c  - nighttime: n/c Lowest sugar was 103 >> 120, he has hypoglycemia awareness in the 90s. Highest sugar was 200s >> 324 (steroid  - L knee inj).  Meter: OneTouch Ultra Mini and now [name]  Pt's meals are: - Brunch: dry cereal + raisins + no milk; misc. Fruit (banana/orange); V8 juice;fried egg + cheddar cheese - Dinner: usually frozen meal  - Snacks: 2-3 nabs  -No CKD, last BUN/creatinine:  Lab Results  Component Value Date   BUN 20 01/02/2019   CREATININE 0.82 01/02/2019  Not on ACE inhibitor/ARB.  Latest ACR normal: Lab Results  Component Value Date   MICRALBCREAT 1.1 08/31/2017   MICRALBCREAT 0.8 08/20/2015   MICRALBCREAT 0.3  09/11/2013   Latest GFR normal Lab Results  Component Value Date   GFRNONAA >60 05/02/2015   GFRNONAA >60 04/27/2015   -+ HL; last set of lipids: Lab Results  Component Value Date   CHOL 136 07/02/2018   HDL 33 (L) 07/02/2018   LDLCALC 79 07/02/2018   TRIG 143 07/02/2018   CHOLHDL 4.1 07/02/2018  He could not tolerate statins because of leg cramps. - last eye exam was in 2019 (reportedly): + DR -He has numbness and tingling in his feet  He had R TKR in 04/2015. He has OSA.  ROS: Constitutional: + weight gain/no weight loss, no fatigue, no subjective hyperthermia, no subjective hypothermia Eyes: no blurry vision, no xerophthalmia ENT: no sore throat, no nodules palpated in neck, no dysphagia, no odynophagia, no hoarseness Cardiovascular: no CP/no SOB/no palpitations/no leg swelling Respiratory: no cough/no SOB/no wheezing Gastrointestinal: no N/no V/no D/no C/no acid reflux Musculoskeletal: no muscle aches/+ joint aches Skin: no rashes, no hair loss Neurological: no tremors/+ numbness/+ tingling/no dizziness  I reviewed pt's medications, allergies, PMH, social hx, family hx, and changes were documented in the history of present illness. Otherwise, unchanged from my initial visit note.  Past Medical History:  Diagnosis Date  . Chronic back pain 09/13/2013  . Chronic pain syndrome 09/13/2013  .  GERD (gastroesophageal reflux disease)    hx. of  . Gout 09/13/2013  . Hemorrhoids   . Hypertension   . Morbid obesity with body mass index of 45.0-49.9 in adult [facility]) 09/13/2013  . Osteoarthritis 09/13/2013  . Pneumonia    hx. of  . Seizures (HCC)   . Sleep apnea    has never used C-pap machine due to insurance cost  . Type 1 diabetes mellitus, uncontrolled (HCC)    Past Surgical History:  Procedure Laterality Date  . broken toe 35 years ago    . TOTAL KNEE ARTHROPLASTY Right 05/01/2015   Procedure: RIGHT TOTAL KNEE ARTHROPLASTY;  Surgeon: Kathryne Hitchhristopher Y Blackman, MD;   Location: [facility];  Service: Orthopedics;  Laterality: Right;  . WISDOM TOOTH EXTRACTION     Social History   Socioeconomic History  . Marital status: Married    Spouse name: Not on file  . Number of children: Not on file  . Years of education: Not on file  . Highest education level: Not on file  Occupational History  . Not on file  Social Needs  . Financial resource strain: Not on file  . Food insecurity:    Worry: Not on file    Inability: Not on file  . Transportation needs:    Medical: Not on file    Non-medical: Not on file  Tobacco Use  . Smoking status: Former Smoker    Last attempt to quit: 03/08/2004    Years since quitting: 14.8  . Smokeless tobacco: Never Used  Substance and Sexual Activity  . Alcohol use: No    Comment: rare  . Drug use: No  . Sexual activity: Yes    Partners: Female  Lifestyle  . Physical activity:    Days per week: Not on file    Minutes per session: Not on file  . Stress: Not on file  Relationships  . Social connections:    Talks on phone: Not on file    Gets together: Not on file    Attends religious service: Not on file    Active member of club or organization: Not on file    Attends meetings of clubs or organizations: Not on file    Relationship status: Not on file  . Intimate partner violence:    Fear of current or ex partner: Not on file    Emotionally abused: Not on file    Physically abused: Not on file    Forced sexual activity: Not on file  Other Topics Concern  . 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 aspirin 81 MG tablet Take 81 mg by mouth once.    . bisoprolol-hydrochlorothiazide (ZIAC) 10-6.25 MG tablet TAKE 1 TABLET BY MOUTH  DAILY 90 tablet 1  . diclofenac (VOLTAREN) 75 MG EC tablet TAKE 1 TABLET BY MOUTH TWO  TIMES DAILY 180 tablet 1  . glipiZIDE (GLUCOTROL) 10 MG tablet TAKE 1  TABLET BY MOUTH  TWICE A DAY BEFORE MEALS 180 tablet 1  . glucose blood (ONE TOUCH TEST STRIPS) test strip Use as instructed two times daily; dx- E11.42, E11.65 200 each 5  . HYDROcodone-acetaminophen (NORCO/VICODIN) 5-325 MG tablet Take 1-2 tablets by mouth every 6 (six) hours as needed for moderate pain. 40 tablet 0  . Insulin Glargine (LANTUS SOLOSTAR) 100 UNIT/ML Solostar Pen Inject 20 units in  am and 65 units at night under skin 60 mL 5  . Insulin Lispro (HUMALOG KWIKPEN) 200 UNIT/ML SOPN Inject 25-35 Units into the skin 3 (three) times daily. 45 mL 4  . Insulin Pen Needle (NOVOTWIST) 32G X 5 MM MISC USE 2x a day 100 each 6  . Insulin Pen Needle 32G X 5 MM MISC USE AS DIRECTED 5 TIMES A DAY WITH INSULIN INJECTIONS E11.65 500 each 5  . metFORMIN (GLUCOPHAGE) 1000 MG tablet Take 1 tablet (1,000 mg total) by mouth 2 (two) times daily with a meal. 180 tablet 1  . oxyCODONE-acetaminophen (PERCOCET/ROXICET) 5-325 MG tablet Take 1 tablet by mouth 2 (two) times daily as needed for severe pain. 20 tablet 0  . predniSONE (DELTASONE) 20 MG tablet Take 2 tablets (40 mg total) by mouth daily. 6 tablet 0  . TRULICITY 1.5 MG/0.5ML SOPN INJECT 1.5 MG ONCE WEEKLY 2 pen 3   No current facility-administered medications on file prior to visit.    Allergies  Allergen Reactions  . Penicillins     REACTION: swelling, rash   Family History  Problem Relation Age of Onset  . Alcohol abuse Father   . Arthritis Maternal Grandmother   . Alcohol abuse Paternal Grandmother   . Alcohol abuse Paternal Grandfather   . Diabetes Maternal Aunt     PE: BP 138/60   Pulse 74   Ht 5' 6.25" (1.683 m)   Wt (!) 345 lb (156.5 kg)   SpO2 95%   BMI 55.26 kg/m  Body mass index is 55.26 kg/m.   Wt Readings from Last 3 Encounters:  01/08/19 (!) 345 lb (156.5 kg)  01/02/19 (!) 348 lb 12.8 oz (158.2 kg)  09/25/18 (!) 334 lb (151.5 kg)   Constitutional: overweight, in NAD Eyes: PERRLA, EOMI, no exophthalmos ENT: moist  mucous membranes, no thyromegaly, no cervical lymphadenopathy Cardiovascular: RRR, No MRG, + bilateral lower extremity edema, L > R Respiratory: CTA B Gastrointestinal: abdomen soft, NT, ND, BS+ Musculoskeletal: no deformities, strength intact in all 4 Skin: moist, warm, no rashes Neurological: no tremor with outstretched hands, DTR normal in all 4  ASSESSMENT: 1. DM2, insulin-dependent, uncontrolled, with complications - PN - stable, worse in L foot - DR  2. PN  3.  Obesity  PLAN:  1. Patient with history of uncontrolled diabetes, on a complex medication regimen, with oral medications, weekly injectable GLP-1 receptor agonist and also basal-bolus insulin regimen with still poor control.  He is resistant to changing his diet.  Sugars were higher before breakfast and lunch at last visit and he was not checking sugars in the evening.  I strongly advised him to start checking sugars before and after dinner and also to try to inject the Humalog 15 minutes before each meal.  We also increased Humalog doses slightly. -At this visit, his weight is higher, he did not check sugars later in the day and only has 2 weeks of data with only fasting sugars.  In this case, we discussed that I cannot adjust his complicated regimen.  We also discussed about the absolute need for him to start on a diet and I referred him to nutrition.  He agrees to schedule this appointment. -We will probably need to switch to U500 insulin in the near future - I advised him to: Patient Instructions  Please continue: - Metformin 1000 mg 2x a daily - Glipizide 10 mg 2x a day  - Trulicity 1.5 mg weekly - Lantus 20 units in a.m.  and 65 units at bedtime - Humalog 30-35 units 3x a day before meals  Please come back for a follow-up appointment in 3-4 months.  - today, HbA1c is 8.2% (increased) - continue checking sugars at different times of the day - check 3x a day, rotating checks - advised for yearly eye exams >> he is  UTD - Return to clinic in 3-4 mo with sugar log     2. HL - Reviewed latest lipid panel from 06/2018: LDL lower than 80, HDL low, triglycerides at goal Lab Results  Component Value Date   CHOL 136 07/02/2018   HDL 33 (L) 07/02/2018   LDLCALC 79 07/02/2018   TRIG 143 07/02/2018   CHOLHDL 4.1 07/02/2018  -Not tolerating statins due to leg cramps.  3.  Obesity - gained 14 lbs since last OV, 22 lbs since this summer -Unfortunately, patient is resistant to changing his diet.  We discussed at every visit about healthy changes and optimal food choices, however, he is not following these directions.  He is also less active due to knee pain. -We will continue Trulicity which should also help with weight loss.  Carlus Pavlov, MD PhD [facility] Endocrinology

## 2019-01-10 ENCOUNTER — Other Ambulatory Visit: Payer: Self-pay | Admitting: Family Medicine

## 2019-01-25 ENCOUNTER — Ambulatory Visit: Payer: Medicare Other | Admitting: *Deleted

## 2019-02-19 ENCOUNTER — Other Ambulatory Visit: Payer: Self-pay | Admitting: Internal Medicine

## 2019-04-19 ENCOUNTER — Telehealth: Payer: Self-pay | Admitting: Internal Medicine

## 2019-04-19 MED ORDER — METFORMIN HCL 1000 MG PO TABS: 1000.0000 mg | ORAL_TABLET | Freq: Two times a day (BID) | ORAL | 3 refills | Status: DC

## 2019-04-19 NOTE — Telephone Encounter (Signed)
RX sent

## 2019-04-19 NOTE — Telephone Encounter (Signed)
MEDICATION: metFORMIN (GLUCOPHAGE) 1000 MG tablet  PHARMACY:  [facility]  IS THIS A 90 DAY SUPPLY :   IS PATIENT OUT OF MEDICATION:   IF NOT; HOW MUCH IS LEFT: 5 pills  LAST APPOINTMENT DATE: @4 /14/2020  NEXT APPOINTMENT DATE:@7 /11/2018  DO WE HAVE YOUR PERMISSION TO LEAVE A DETAILED MESSAGE:  OTHER COMMENTS:    **Let patient know to contact pharmacy at the end of the day to make sure medication is ready. **  ** Please notify patient to allow 48-72 hours to process**  **Encourage patient to contact the pharmacy for refills or they can request refills through [facility]**

## 2019-04-30 ENCOUNTER — Other Ambulatory Visit: Payer: Self-pay

## 2019-04-30 ENCOUNTER — Telehealth: Payer: Self-pay | Admitting: Internal Medicine

## 2019-04-30 MED ORDER — METFORMIN HCL 1000 MG PO TABS: 1000.0000 mg | ORAL_TABLET | Freq: Two times a day (BID) | ORAL | 3 refills | Status: DC

## 2019-04-30 NOTE — Telephone Encounter (Signed)
Sent to [facility] [address]

## 2019-04-30 NOTE — Telephone Encounter (Signed)
RX for Metformin that was sent [facility] appears to be lost in the mail-tracking# does not work. Please send new RX:  MEDICATION: metFORMIN (GLUCOPHAGE) 1000 MG tablet  [facility] [address] [place]  IS PATIENT OUT OF MEDICATION: Yes-for 10 days  IF NOT; HOW MUCH IS LEFT: None  LAST APPOINTMENT DATE: @6 /10/2019  NEXT APPOINTMENT DATE:@7 /11/2018  DO WE HAVE YOUR PERMISSION TO LEAVE A DETAILED MESSAGE:Yes  OTHER COMMENTS:    **Let patient know to contact pharmacy at the end of the day to make sure medication is ready. **  ** Please notify patient to allow 48-72 hours to process**  **Encourage patient to contact the pharmacy for refills or they can request refills through [facility]**

## 2019-05-06 ENCOUNTER — Other Ambulatory Visit: Payer: Self-pay

## 2019-05-08 ENCOUNTER — Encounter: Payer: Self-pay | Admitting: Internal Medicine

## 2019-05-08 ENCOUNTER — Other Ambulatory Visit: Payer: Self-pay

## 2019-05-08 ENCOUNTER — Ambulatory Visit (INDEPENDENT_AMBULATORY_CARE_PROVIDER_SITE_OTHER): Payer: PRIVATE HEALTH INSURANCE | Admitting: Internal Medicine

## 2019-05-08 VITALS — BP 128/60 | HR 71 | Ht 66.25 in | Wt 343.0 lb

## 2019-05-08 DIAGNOSIS — E1165 Type 2 diabetes mellitus with hyperglycemia: Secondary | ICD-10-CM

## 2019-05-08 DIAGNOSIS — Z6841 Body Mass Index (BMI) 40.0 and over, adult: Secondary | ICD-10-CM

## 2019-05-08 DIAGNOSIS — E1142 Type 2 diabetes mellitus with diabetic polyneuropathy: Secondary | ICD-10-CM | POA: Diagnosis not present

## 2019-05-08 DIAGNOSIS — G63 Polyneuropathy in diseases classified elsewhere: Secondary | ICD-10-CM | POA: Insufficient documentation

## 2019-05-08 DIAGNOSIS — E785 Hyperlipidemia, unspecified: Secondary | ICD-10-CM | POA: Diagnosis not present

## 2019-05-08 DIAGNOSIS — IMO0002 Reserved for concepts with insufficient information to code with codable children: Secondary | ICD-10-CM

## 2019-05-08 DIAGNOSIS — E1169 Type 2 diabetes mellitus with other specified complication: Secondary | ICD-10-CM | POA: Diagnosis not present

## 2019-05-08 LAB — POCT GLYCOSYLATED HEMOGLOBIN (HGB A1C): Hemoglobin A1C: 7.6 % — AB (ref 4.0–5.6)

## 2019-05-08 NOTE — Progress Notes (Signed)
Patient ID: Christian BarmanGeorge E [name], male   DOB: 08/27/1951, 68 y.o.   MRN: [id number]  HPI Christian BarmanGeorge E [name] is a 68 y.o.-year-old male, returning for f/u for DM2, dx 2009, insulin-dependent since 2014, uncontrolled, with complications (DR, Peripheral neuropathy). Last visit 3.5 months ago. [name] [employer].    Last hemoglobin A1c was: Lab Results  Component Value Date   HGBA1C 8.2 (A) 01/08/2019   HGBA1C 7.7 (A) 09/25/2018   HGBA1C 7.4 (A) 05/25/2018  08/24/2017: HbA1c 9%  Pt is on: - Metformin 1000 mg 2x a daily - Glipizide 10 mg 2x a day  - Trulicity 1.5 mg weekly - Lantus 20 units in a.m. and 65 units at bedtime - Humalog 30-35 units 3x a day before meals Stopped Jardiance 25 mg >> but too expensive - in the donut hole; had yeast infections. Glipizide and Invokana were started 03/2014.  He has been on Victoza in the past >> not efficient anymore.  Pt checks his sugars 0 to once a day: - am:   103, 150-170, 205 >> 120-199, 236 >> 91, 113--266 - 2h after b'fast: n/c >> 157 >> n/c - before lunch: 280 >> 125 >> 190-200 >> n/c - 2h after lunch: 230-235 >> 220-230 >> n/c - before dinner: 180s >> n/c >> 200s >> n/c  - 2h after dinner: n/c  - bedtime:  230s, 318 >> n/c  - nighttime: n/c Lowest sugar was 103 >> 120 >> 91, he has hypoglycemia awareness in the 90s. Highest sugar was 324 (steroid  - L knee inj) >> 266  Meter: OneTouch Ultra Mini >> [name]  Pt's meals are: - Brunch: dry cereal + raisins + no milk; misc. Fruit (banana/orange); V8 juice;fried egg + cheddar cheese - Dinner: usually frozen meal  - Snacks: 2-3 nabs  -No CKD, last BUN/creatinine:  Lab Results  Component Value Date   BUN 20 01/02/2019   CREATININE 0.82 01/02/2019  Not on an ACE inhibitor/ARB.  Latest ACR normal: Lab Results  Component Value Date   MICRALBCREAT 1.1 08/31/2017   MICRALBCREAT 0.8 08/20/2015   MICRALBCREAT 0.3 09/11/2013   Latest GFR was normal Lab Results  Component Value Date    GFRNONAA >60 05/02/2015   GFRNONAA >60 04/27/2015   -+ HL; last set of lipids: Lab Results  Component Value Date   CHOL 136 07/02/2018   HDL 33 (L) 07/02/2018   LDLCALC 79 07/02/2018   TRIG 143 07/02/2018   CHOLHDL 4.1 07/02/2018  He could not tolerate statins due to leg cramps. - last eye exam was in 2019 reportedly, + TR -+ numbness and tingling in his feet  He had R TKR in 04/2015.  He has steroid inj in L knee and L shoulder.  Also uses CBD oil liniment and this helps. He has OSA.  ROS: Constitutional: no weight gain/no weight loss, no fatigue, no subjective hyperthermia, no subjective hypothermia Eyes: no blurry vision, no xerophthalmia ENT: no sore throat, no nodules palpated in neck, no dysphagia, no odynophagia, no hoarseness Cardiovascular: no CP/no SOB/no palpitations/no leg swelling Respiratory: no cough/no SOB/no wheezing Gastrointestinal: no N/no V/no D/no C/no acid reflux Musculoskeletal: no muscle aches/no joint aches Skin: no rashes, no hair loss Neurological: no tremors/+ numbness/+ tingling/no dizziness  I reviewed pt's medications, allergies, PMH, social hx, family hx, and changes were documented in the history of present illness. Otherwise, unchanged from my initial visit note.   Past Medical History:  Diagnosis Date  . Chronic back pain 09/13/2013  .  Chronic pain syndrome 09/13/2013  . GERD (gastroesophageal reflux disease)    hx. of  . Gout 09/13/2013  . Hemorrhoids   . Hypertension   . Morbid obesity with body mass index of 45.0-49.9 in adult [facility](HCC) 09/13/2013  . Osteoarthritis 09/13/2013  . Pneumonia    hx. of  . Seizures (HCC)   . Sleep apnea    has never used C-pap machine due to insurance cost  . Type 1 diabetes mellitus, uncontrolled (HCC)    Past Surgical History:  Procedure Laterality Date  . broken toe 35 years ago    . TOTAL KNEE ARTHROPLASTY Right 05/01/2015   Procedure: RIGHT TOTAL KNEE ARTHROPLASTY;  Surgeon: Kathryne Hitchhristopher Y Blackman,  MD;  Location: [facility];  Service: Orthopedics;  Laterality: Right;  . WISDOM TOOTH EXTRACTION     Social History   Socioeconomic History  . Marital status: Married    Spouse name: Not on file  . Number of children: Not on file  . Years of education: Not on file  . Highest education level: Not on file  Occupational History  . Not on file  Social Needs  . Financial resource strain: Not on file  . Food insecurity    Worry: Not on file    Inability: Not on file  . Transportation needs    Medical: Not on file    Non-medical: Not on file  Tobacco Use  . Smoking status: Former Smoker    Quit date: 03/08/2004    Years since quitting: 15.1  . Smokeless tobacco: Never Used  Substance and Sexual Activity  . Alcohol use: No    Comment: rare  . Drug use: No  . Sexual activity: Yes    Partners: Female  Lifestyle  . Physical activity    Days per week: Not on file    Minutes per session: Not on file  . Stress: Not on file  Relationships  . Social Musicianconnections    Talks on phone: Not on file    Gets together: Not on file    Attends religious service: Not on file    Active member of club or organization: Not on file    Attends meetings of clubs or organizations: Not on file    Relationship status: Not on file  . Intimate partner violence    Fear of current or ex partner: Not on file    Emotionally abused: Not on file    Physically abused: Not on file    Forced sexual activity: Not on file  Other Topics Concern  . 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 aspirin 81 MG tablet Take 81 mg by mouth once.    . bisoprolol-hydrochlorothiazide (ZIAC) 10-6.25 MG tablet TAKE 1 TABLET BY MOUTH  DAILY 90 tablet 1  . diclofenac (VOLTAREN) 75 MG EC tablet TAKE 1 TABLET BY MOUTH TWO  TIMES DAILY 180 tablet 1  . glipiZIDE (GLUCOTROL) 10 MG tablet TAKE 1 TABLET BY MOUTH   TWICE A DAY BEFORE MEALS 180 tablet 1  . HYDROcodone-acetaminophen (NORCO/VICODIN) 5-325 MG tablet Take 1-2 tablets by mouth every 6 (six) hours as needed for moderate pain. 40 tablet 0  . Insulin Glargine (LANTUS SOLOSTAR) 100 UNIT/ML Solostar Pen Inject 20 units in am and 65 units at night under skin 60 mL 5  . Insulin Lispro (HUMALOG KWIKPEN) 200  UNIT/ML SOPN Inject 25-35 Units into the skin 3 (three) times daily. 45 mL 4  . Insulin Pen Needle 32G X 5 MM MISC USE AS DIRECTED 5 TIMES A DAY WITH INSULIN INJECTIONS E11.65 500 each 5  . metFORMIN (GLUCOPHAGE) 1000 MG tablet Take 1 tablet (1,000 mg total) by mouth 2 (two) times daily with a meal. 180 tablet 3  . oxyCODONE-acetaminophen (PERCOCET/ROXICET) 5-325 MG tablet Take 1 tablet by mouth 2 (two) times daily as needed for severe pain. 20 tablet 0  . predniSONE (DELTASONE) 20 MG tablet Take 2 tablets (40 mg total) by mouth daily. 6 tablet 0  . TRULICITY 1.5 NF/6.2ZH SOPN INJECT 1.5 MG ONCE WEEKLY 2 pen 3   No current facility-administered medications on file prior to visit.    Allergies  Allergen Reactions  . Penicillins     REACTION: swelling, rash   Family History  Problem Relation Age of Onset  . Alcohol abuse Father   . Arthritis Maternal Grandmother   . Alcohol abuse Paternal Grandmother   . Alcohol abuse Paternal Grandfather   . Diabetes Maternal Aunt     PE: BP 128/60   Pulse 71   Ht 5' 6.25" (1.683 m)   Wt (!) 343 lb (155.6 kg)   SpO2 94%   BMI 54.94 kg/m  Body mass index is 54.94 kg/m.   Wt Readings from Last 3 Encounters:  05/08/19 (!) 343 lb (155.6 kg)  01/08/19 (!) 345 lb (156.5 kg)  01/02/19 (!) 348 lb 12.8 oz (158.2 kg)   Constitutional: obese, in NAD Eyes: PERRLA, EOMI, no exophthalmos ENT: moist mucous membranes, no thyromegaly, no cervical lymphadenopathy Cardiovascular: RRR, No MRG, + bilateral lower extremity edema left more than right Respiratory: CTA B Gastrointestinal: abdomen soft, NT, ND,  BS+ Musculoskeletal: no deformities, strength intact in all 4 Skin: moist, warm, no rashes, + small blood blister L hallux Neurological: no tremor with outstretched hands, DTR normal in all 4  ASSESSMENT: 1. DM2, insulin-dependent, uncontrolled, with complications - PN - stable, worse in L foot - DR  2. PN  3.  Obesity  PLAN:  1. Patient with history of uncontrolled diabetes, on a complex medication regimen with oral medications, weekly injectable GLP-1 receptor agonist and basal-bolus insulin regimen with still poor control due to poor diet and inactivity.  At last visit sugars are higher, as was his HbA1c, at 8.2%.  We discussed about the importance of improving his diet and I referred him to nutrition, but he did not have this appointment yet due to the coronavirus pandemic.  We did not change his medication regimen at that time. - we will most likely need to switch to U500 insulin in the near future. -At this visit, reviewing his meter download, he only checks his sugars once a day despite advised to check several times a day since he is taking such a complex regimen.  His sugars were closer to goal in 02/2019, but they started to worsen afterwards.  He is still having a poor diet and will need to see nutrition.  I advised him to schedule this at this visit. -For now, since the last month his sugars are higher in the morning, will try to move the entire metformin dose with dinner.  I also advised him to increase the dose of Humalog for large meals, for example pizza or if he has dessert after meal. - I advised him to: Patient Instructions  Please continue: - Metformin but move this with dinner (  2000 mg) - Glipizide 10 mg 2x a day  - Trulicity 1.5 mg weekly - Lantus 20 units in a.m. and 65 units at bedtime - Humalog 30-35 (40) units 3x a day before meals  Please come back for a follow-up appointment in 4 months.  - today, HbA1c is 7.6% (better) - check sugars at different times of  the day - check 3x a day, rotating checks - advised for yearly eye exams >> he is UTD - Return to clinic in 4 mo with sugar log     2. HL - Reviewed latest lipid panel from 06/2018: LDL at goal, HDL low Lab Results  Component Value Date   CHOL 136 07/02/2018   HDL 33 (L) 07/02/2018   LDLCALC 79 07/02/2018   TRIG 143 07/02/2018   CHOLHDL 4.1 07/02/2018  -Not tolerating statins due to leg cramps.  3.  Obesity -At last visit, he gained 22 pounds since summer 2019. -I discussed with him multiple times in the past about trying to change his diet but he was resistant to suggestions, but at last visit he accepted a referral to nutrition -he did not have this appointment due to the coronavirus pandemic.Marland Kitchen.  At last visit he was also less active due to knee pain.   -Continue Trulicity which should also help with weight loss -No significant weight loss since last visit  Carlus Pavlovristina Ayeden Gladman, MD PhD [facility]

## 2019-05-08 NOTE — Patient Instructions (Addendum)
Please continue: - Metformin but move this with dinner (2000 mg) - Glipizide 10 mg 2x a day  - Trulicity 1.5 mg weekly - Lantus 20 units in a.m. and 65 units at bedtime - Humalog 30-35 (40) units 3x a day before meals  Please come back for a follow-up appointment in 4 months.

## 2019-06-18 ENCOUNTER — Other Ambulatory Visit: Payer: Self-pay | Admitting: Internal Medicine

## 2019-06-24 ENCOUNTER — Other Ambulatory Visit: Payer: Self-pay | Admitting: Family Medicine

## 2019-06-24 ENCOUNTER — Other Ambulatory Visit: Payer: Self-pay | Admitting: Internal Medicine

## 2019-08-08 ENCOUNTER — Telehealth: Payer: Self-pay | Admitting: Family Medicine

## 2019-08-08 NOTE — Telephone Encounter (Signed)
I called and advised the patient. 

## 2019-08-08 NOTE — Telephone Encounter (Signed)
Pt called in requesting a refill on Diclofenac, Bisoprolol-hydrochlorothiazide.  Please have those sent to [facility] mail service.    [phone]

## 2019-08-08 NOTE — Telephone Encounter (Signed)
Sent!

## 2019-08-21 ENCOUNTER — Telehealth: Payer: Self-pay | Admitting: Family Medicine

## 2019-08-21 MED ORDER — BISOPROLOL-HYDROCHLOROTHIAZIDE 10-6.25 MG PO TABS: 1.0000 | ORAL_TABLET | Freq: Every day | ORAL | 3 refills | Status: DC

## 2019-08-21 NOTE — Telephone Encounter (Signed)
Rx sent 

## 2019-08-21 NOTE — Telephone Encounter (Signed)
Patient's wife Lelan Pons called advised patient need Rx refilled (Bisoprolol-Fumarate) [facility] uses the [facility] on [address]. The number to contact Lelan Pons is [phone]

## 2019-08-21 NOTE — Telephone Encounter (Signed)
I advised the patient

## 2019-08-21 NOTE — Telephone Encounter (Signed)
I spoke with the wife and the patient: [facility] does not have bisoprolol-hctz 10-6.25 in stock, so they just did not fill his mail order Rx that we sent, nor did they notify us.  So, the patient is requesting a 90-day Rx to be sent in to [facility]. I called that pharmacy to make sure they have it -- per [facility], they do.

## 2019-09-04 ENCOUNTER — Other Ambulatory Visit: Payer: Self-pay | Admitting: Internal Medicine

## 2019-09-09 ENCOUNTER — Other Ambulatory Visit: Payer: Self-pay

## 2019-09-09 ENCOUNTER — Encounter: Payer: Self-pay | Admitting: Internal Medicine

## 2019-09-09 ENCOUNTER — Ambulatory Visit (INDEPENDENT_AMBULATORY_CARE_PROVIDER_SITE_OTHER): Payer: PRIVATE HEALTH INSURANCE | Admitting: Internal Medicine

## 2019-09-09 VITALS — BP 120/60 | HR 45 | Ht 66.25 in | Wt 352.0 lb

## 2019-09-09 DIAGNOSIS — E785 Hyperlipidemia, unspecified: Secondary | ICD-10-CM | POA: Diagnosis not present

## 2019-09-09 DIAGNOSIS — Z6841 Body Mass Index (BMI) 40.0 and over, adult: Secondary | ICD-10-CM | POA: Diagnosis not present

## 2019-09-09 DIAGNOSIS — E1165 Type 2 diabetes mellitus with hyperglycemia: Secondary | ICD-10-CM

## 2019-09-09 DIAGNOSIS — E1142 Type 2 diabetes mellitus with diabetic polyneuropathy: Secondary | ICD-10-CM | POA: Diagnosis not present

## 2019-09-09 DIAGNOSIS — IMO0002 Reserved for concepts with insufficient information to code with codable children: Secondary | ICD-10-CM

## 2019-09-09 DIAGNOSIS — E1169 Type 2 diabetes mellitus with other specified complication: Secondary | ICD-10-CM | POA: Diagnosis not present

## 2019-09-09 LAB — MICROALBUMIN / CREATININE URINE RATIO
Creatinine,U: 176.4 mg/dL
Microalb Creat Ratio: 0.7 mg/g (ref 0.0–30.0)
Microalb, Ur: 1.3 mg/dL (ref 0.0–1.9)

## 2019-09-09 LAB — POCT GLYCOSYLATED HEMOGLOBIN (HGB A1C): Hemoglobin A1C: 6.9 % — AB (ref 4.0–5.6)

## 2019-09-09 LAB — LIPID PANEL
Cholesterol: 149 mg/dL (ref 0–200)
HDL: 33.5 mg/dL — ABNORMAL LOW (ref >39.00)
LDL Cholesterol: 85 mg/dL (ref 0–99)
NonHDL: 115.4
Total CHOL/HDL Ratio: 4
Triglycerides: 150 mg/dL — ABNORMAL HIGH (ref 0.0–149.0)
VLDL: 30 mg/dL (ref 0.0–40.0)

## 2019-09-09 MED ORDER — HUMALOG KWIKPEN 200 UNIT/ML ~~LOC~~ SOPN: 35.0000 [IU] | PEN_INJECTOR | Freq: Three times a day (TID) | SUBCUTANEOUS | 4 refills | Status: DC

## 2019-09-09 MED ORDER — TRULICITY 3 MG/0.5ML ~~LOC~~ SOAJ: 3.0000 mg | SUBCUTANEOUS | 11 refills | Status: DC

## 2019-09-09 NOTE — Progress Notes (Signed)
Patient ID: Christian [name], male   DOB: 1950-11-17, 68 y.o.   MRN: [id number]  HPI Christian [name] is a 68 y.o.-year-old male, returning for f/u for DM2, dx 2009, insulin-dependent since 2014, uncontrolled, with complications (DR, Peripheral neuropathy). Last visit: 4 Months ago. [name] [employer].    Reviewed HbA1c levels: Lab Results  Component Value Date   HGBA1C 7.6 (A) 05/08/2019   HGBA1C 8.2 (A) 01/08/2019   HGBA1C 7.7 (A) 09/25/2018   HGBA1C 7.4 (A) 05/25/2018   HGBA1C 9.0 02/22/2018   HGBA1C 9.1 11/23/2017   HGBA1C 9 08/24/2017   HGBA1C 8.4 05/25/2017   HGBA1C 8.8 02/06/2017   HGBA1C 8.8 11/08/2016   HGBA1C 7.3 08/03/2016   HGBA1C 7.7 05/03/2016   HGBA1C 7.6 01/27/2016   HGBA1C 7.4 10/29/2015   HGBA1C 6.8 07/30/2015   HGBA1C 7.4 (H) 02/16/2015   HGBA1C 7.9 (H) 11/05/2014   HGBA1C 8.1 (H) 07/15/2014   HGBA1C 10.8 (H) 03/24/2014   HGBA1C 9.8 (H) 09/11/2013  08/24/2017: HbA1c 9%  Pt is on: - Metformin 1000 mg 2x a daily >> 2000 m9 with dinner - Glipizide 10 mg 2x a day  - Trulicity 1.5 mg weekly - Lantus 20 units in a.m. and 65 units at bedtime - Humalog 30-35 >> 30-40 units 3x a day before meals (35-35-40) Stopped Jardiance 25 mg >> but too expensive - in the donut hole; had yeast infections. Glipizide and Invokana were started 03/2014.  He has been on Victoza in the past >> not efficient anymore.  Pt checks his sugars 0 to once a day: - am:  120-199, 236 >> 91, 113-266 >> 90, 119-165, 278 - 2h after b'fast: n/c >> 157 >> n/c - before lunch: 280 >> 125 >> 190-200 >> n/c - 2h after lunch: 230-235 >> 220-230 >> n/c >> 190-220 - before dinner: 180s >> n/c >> 200s >> n/c  - 2h after dinner: n/c  - bedtime:  230s, 318 >> n/c 165-180 - nighttime: n/c Lowest sugar was 103 >> 120 >> 91 >> 90, he has hypoglycemia awareness in the 90s. Highest sugar was 324 (steroid  - L knee inj) >> 266 >> 278 (after ham sandwich, in am).  Meter: OneTouch Ultra Mini >> Christian [name]  Pt's  meals are: - Brunch: dry cereal + raisins + no milk; misc. Fruit (banana/orange); V8 juice;fried egg + cheddar cheese - Dinner: usually frozen meal  - Snacks: 2-3 nabs  -No CKD, last BUN/creatinine:  Lab Results  Component Value Date   BUN 20 01/02/2019   CREATININE 0.82 01/02/2019  Not on an ACE inhibitor/ARB.  Latest ACR was normal: Lab Results  Component Value Date   MICRALBCREAT 1.1 08/31/2017   MICRALBCREAT 0.8 08/20/2015   MICRALBCREAT 0.3 09/11/2013   Latest GFR was normal: Lab Results  Component Value Date   GFRNONAA >60 05/02/2015   GFRNONAA >60 04/27/2015   -+ HL; last set of lipids: Lab Results  Component Value Date   CHOL 136 07/02/2018   HDL 33 (L) 07/02/2018   LDLCALC 79 07/02/2018   TRIG 143 07/02/2018   CHOLHDL 4.1 07/02/2018  He could not tolerate statins due to leg cramps. - last eye exam was in 08/2019: reportedly: + DR -+ numbness and tingling in his feet  He had R TKR in 04/2015.  He has steroid inj in L knee and L shoulder.  Also uses CBD oil liniment and this helps. He has OSA.  ROS: Constitutional: + weight gain/no weight loss, no  fatigue, no subjective hyperthermia, no subjective hypothermia Eyes: no blurry vision, no xerophthalmia ENT: no sore throat, no nodules palpated in neck, no dysphagia, no odynophagia, no hoarseness Cardiovascular: no CP/no SOB/no palpitations/no leg swelling Respiratory: no cough/no SOB/no wheezing Gastrointestinal: no N/no V/no D/no C/no acid reflux Musculoskeletal: no muscle aches/no joint aches Skin: no rashes, no hair loss Neurological: no tremors/+ numbness/+ tingling/no dizziness  I reviewed pt's medications, allergies, PMH, social hx, family hx, and changes were documented in the history of present illness. Otherwise, unchanged from my initial visit note.   Past Medical History:  Diagnosis Date  . Chronic back pain 09/13/2013  . Chronic pain syndrome 09/13/2013  . GERD (gastroesophageal reflux disease)     hx. of  . Gout 09/13/2013  . Hemorrhoids   . Hypertension   . Morbid obesity with body mass index of 45.0-49.9 in adult [facility]) 09/13/2013  . Osteoarthritis 09/13/2013  . Pneumonia    hx. of  . Seizures (HCC)   . Sleep apnea    has never used C-pap machine due to insurance cost  . Type 1 diabetes mellitus, uncontrolled (HCC)    Past Surgical History:  Procedure Laterality Date  . broken toe 35 years ago    . TOTAL KNEE ARTHROPLASTY Right 05/01/2015   Procedure: RIGHT TOTAL KNEE ARTHROPLASTY;  Surgeon: Kathryne Hitch, MD;  Location: [facility];  Service: Orthopedics;  Laterality: Right;  . WISDOM TOOTH EXTRACTION     Social History   Socioeconomic History  . Marital status: Married    Spouse name: Not on file  . Number of children: Not on file  . Years of education: Not on file  . Highest education level: Not on file  Occupational History  . Not on file  Social Needs  . Financial resource strain: Not on file  . Food insecurity    Worry: Not on file    Inability: Not on file  . Transportation needs    Medical: Not on file    Non-medical: Not on file  Tobacco Use  . Smoking status: Former Smoker    Quit date: 03/08/2004    Years since quitting: 15.5  . Smokeless tobacco: Never Used  Substance and Sexual Activity  . Alcohol use: No    Comment: rare  . Drug use: No  . Sexual activity: Yes    Partners: Female  Lifestyle  . Physical activity    Days per week: Not on file    Minutes per session: Not on file  . Stress: Not on file  Relationships  . Social Musician on phone: Not on file    Gets together: Not on file    Attends religious service: Not on file    Active member of club or organization: Not on file    Attends meetings of clubs or organizations: Not on file    Relationship status: Not on file  . Intimate partner violence    Fear of current or ex partner: Not on file    Emotionally abused: Not on file    Physically abused: Not on file     Forced sexual activity: Not on file  Other Topics Concern  . Not on file  Social History Narrative   Psychiatric nurse for [employer]   Married   Current Outpatient Medications on File Prior to Visit  Medication Sig Dispense Refill  . aspirin 81 MG tablet Take 81 mg by mouth once.    . bisoprolol-hydrochlorothiazide [facility])  10-6.25 MG tablet Take 1 tablet by mouth daily. 90 tablet 3  . diclofenac (VOLTAREN) 75 MG EC tablet TAKE 1 TABLET BY MOUTH TWO  TIMES DAILY 180 tablet 3  . glipiZIDE (GLUCOTROL) 10 MG tablet TAKE 1 TABLET BY MOUTH  TWICE A DAY BEFORE MEALS 180 tablet 3  . HYDROcodone-acetaminophen (NORCO/VICODIN) 5-325 MG tablet Take 1-2 tablets by mouth every 6 (six) hours as needed for moderate pain. (Patient not taking: Reported on 05/08/2019) 40 tablet 0  . Insulin Glargine (LANTUS SOLOSTAR) 100 UNIT/ML Solostar Pen INJECT 20 UNITS IN AM AND 65 UNITS AT NIGHT UNDER SKIN 15 mL 3  . Insulin Lispro (HUMALOG KWIKPEN) 200 UNIT/ML SOPN Inject 25-35 Units into the skin 3 (three) times daily. 45 mL 4  . Insulin Pen Needle 32G X 5 MM MISC USE AS DIRECTED 5 TIMES A DAY WITH INSULIN INJECTIONS E11.65 500 each 5  . metFORMIN (GLUCOPHAGE) 1000 MG tablet Take 1 tablet (1,000 mg total) by mouth 2 (two) times daily with a meal. 180 tablet 3  . oxyCODONE-acetaminophen (PERCOCET/ROXICET) 5-325 MG tablet Take 1 tablet by mouth 2 (two) times daily as needed for severe pain. 20 tablet 0  . TRULICITY 1.5 TI/4.5YK SOPN INJECT 1.5 MG ONCE WEEKLY 2 pen 3   No current facility-administered medications on file prior to visit.    Allergies  Allergen Reactions  . Penicillins     REACTION: swelling, rash   Family History  Problem Relation Age of Onset  . Alcohol abuse Father   . Arthritis Maternal Grandmother   . Alcohol abuse Paternal Grandmother   . Alcohol abuse Paternal Grandfather   . Diabetes Maternal Aunt     PE: BP 120/60   Pulse (!) 45   Ht 5' 6.25" (1.683 m) Comment: measured  Wt (!) 352 lb  (159.7 kg)   SpO2 96%   BMI 56.39 kg/m  Body mass index is 56.39 kg/m.   Wt Readings from Last 3 Encounters:  09/09/19 (!) 352 lb (159.7 kg)  05/08/19 (!) 343 lb (155.6 kg)  01/08/19 (!) 345 lb (156.5 kg)   Constitutional: overweight, in NAD Eyes: PERRLA, EOMI, no exophthalmos ENT: moist mucous membranes, no thyromegaly, no cervical lymphadenopathy Cardiovascular: RRR, No MRG, LE edema L>R  Respiratory: CTA B Gastrointestinal: abdomen soft, NT, ND, BS+ Musculoskeletal: no deformities, strength intact in all 4 Skin: moist, warm, no rashes except stasis dermatitis L periankle Neurological: no tremor with outstretched hands, DTR normal in all 4  ASSESSMENT: 1. DM2, insulin-dependent, uncontrolled, with complications - PN - stable, worse in L foot - DR  2. PN  3.  Obesity  PLAN:  1. Patient with history of uncontrolled diabetes on Metformin, sulfonylurea, weekly GLP-1 receptor agonist and basal-bolus insulin regimen, with still poor control due to poor diet and lack of exercise.  At last visit sugars are higher, as was his HbA1c, at 8.2%.  He is on a complex medication regimen and may need U500 in the future. -He did not see nutrition yet. -At last visit, he was only checking sugars once a day despite advised to check several times a day since he is taking such a complex regimen. -At that time, HbA1c was slightly better, at 7.6%.  Since his sugars are higher in the morning we move the entire metformin dose with dinner and I advised him to increase Humalog with larger meals. He got better results after the above changes >> sugars are lower. They are still above target at times  depending on dietary indiscretions. - for today, I advised him to increase Trulicity to 3 mg weekly to improve sugars after meals, and will continue the same insulin doses. - I advised him to: Patient Instructions  Please continue: - Metformin 2000 mg daily with dinner  - Glipizide 10 mg 2x a day  - Lantus  20 units in a.m. and 65 units at bedtime - Humalog 30-40 units 3x a day before meals  Please increase: - Trulicity 3 mg weekly  Please come back for a follow-up appointment in 4 months.  - we checked his HbA1c: 6.9% (better!) - advised to check sugars at different times of the day - 3x a day, rotating check times - advised for yearly eye exams >> he is UTD - refuses flu shot -check annual labs today - return to clinic in 4 months     2. HL -Reviewed latest lipid panel from 06/2018: LDL at goal, HDL low Lab Results  Component Value Date   CHOL 136 07/02/2018   HDL 33 (L) 07/02/2018   LDLCALC 79 07/02/2018   TRIG 143 07/02/2018   CHOLHDL 4.1 07/02/2018  -Not tolerating statins due to leg cramps. - will recheck today  3.  Obesity -We discussed multiple times in the past about trying to change his diet but he was resistant to suggestions.  He did accept a referral to nutrition but did not have this appointment due to the coronavirus pandemic.  He is also less active due to knee pain. -Continue Trulicity which should also help with weight loss -he gained 9 lbs since last OV  Component     Latest Ref Rng & Units 09/09/2019          Glucose     65 - 99 mg/dL 55 (L)  BUN     7 - 25 mg/dL 21  Creatinine     1.910.70 - 1.25 mg/dL 4.780.93  GFR, Est Non African American     > OR = 60 mL/min/1.2273m2 84  GFR, Est African American     > OR = 60 mL/min/1.2473m2 97  BUN/Creatinine Ratio     6 - 22 (calc) NOT APPLICABLE  Sodium     135 - 146 mmol/L 141  Potassium     3.5 - 5.3 mmol/L 4.3  Chloride     98 - 110 mmol/L 105  CO2     20 - 32 mmol/L 24  Calcium     8.6 - 10.3 mg/dL 9.8  Total Protein     6.1 - 8.1 g/dL 7.3  Albumin MSPROF     3.6 - 5.1 g/dL 4.0  Globulin     1.9 - 3.7 g/dL (calc) 3.3  AG Ratio     1.0 - 2.5 (calc) 1.2  Total Bilirubin     0.2 - 1.2 mg/dL 0.5  Alkaline phosphatase (APISO)     35 - 144 U/L 76  AST     10 - 35 U/L 23  ALT     9 - 46 U/L 24   Cholesterol     0 - 200 mg/dL 295149  Triglycerides     0.0 - 149.0 mg/dL 621.3150.0 (H)  HDL Cholesterol     >39.00 mg/dL 08.6533.50 (L)  VLDL     0.0 - 40.0 mg/dL 78.430.0  LDL (calc)     0 - 99 mg/dL 85  Total CHOL/HDL Ratio      4  NonHDL      115.40  Microalb, Ur     0.0 - 1.9 mg/dL 1.3  Creatinine,U     mg/dL 161.0  MICROALB/CREAT RATIO     0.0 - 30.0 mg/g 0.7   Slightly high triglycerides and low HDL, but LDL at goal.  ACR normal.  Kidney function normal.  Glucose low -patient was mentating well at the time of the appointment and did not have any complaints.  Christian Pavlov, MD PhD [facility] Endocrin[facility]

## 2019-09-09 NOTE — Patient Instructions (Signed)
Please continue: - Metformin 2000 mg daily with dinner  - Glipizide 10 mg 2x a day  - Lantus 20 units in a.m. and 65 units at bedtime - Humalog 30-40 units 3x a day before meals  Please increase: - Trulicity 3 mg weekly  Please come back for a follow-up appointment in 4 months.

## 2019-09-10 LAB — COMPLETE METABOLIC PANEL WITH GFR
AG Ratio: 1.2 (calc) (ref 1.0–2.5)
ALT: 24 U/L (ref 9–46)
AST: 23 U/L (ref 10–35)
Albumin: 4 g/dL (ref 3.6–5.1)
Alkaline phosphatase (APISO): 76 U/L (ref 35–144)
BUN: 21 mg/dL (ref 7–25)
CO2: 24 mmol/L (ref 20–32)
Calcium: 9.8 mg/dL (ref 8.6–10.3)
Chloride: 105 mmol/L (ref 98–110)
Creat: 0.93 mg/dL (ref 0.70–1.25)
GFR, Est African American: 97 mL/min/{1.73_m2} (ref >=60)
GFR, Est Non African American: 84 mL/min/{1.73_m2} (ref >=60)
Globulin: 3.3 g/dL (calc) (ref 1.9–3.7)
Glucose, Bld: 55 mg/dL — ABNORMAL LOW (ref 65–99)
Potassium: 4.3 mmol/L (ref 3.5–5.3)
Sodium: 141 mmol/L (ref 135–146)
Total Bilirubin: 0.5 mg/dL (ref 0.2–1.2)
Total Protein: 7.3 g/dL (ref 6.1–8.1)

## 2019-10-25 ENCOUNTER — Other Ambulatory Visit: Payer: Self-pay

## 2019-10-25 ENCOUNTER — Encounter: Payer: Self-pay | Admitting: Dietician

## 2019-10-25 ENCOUNTER — Encounter: Payer: PRIVATE HEALTH INSURANCE | Attending: Family Medicine | Admitting: Dietician

## 2019-10-25 DIAGNOSIS — E1142 Type 2 diabetes mellitus with diabetic polyneuropathy: Secondary | ICD-10-CM | POA: Diagnosis not present

## 2019-10-25 DIAGNOSIS — E1165 Type 2 diabetes mellitus with hyperglycemia: Secondary | ICD-10-CM | POA: Diagnosis not present

## 2019-10-25 DIAGNOSIS — IMO0002 Reserved for concepts with insufficient information to code with codable children: Secondary | ICD-10-CM

## 2019-10-25 NOTE — Patient Instructions (Addendum)
-  Aim to have dinner before 7 pm. -Aim for a routine with your meal schedule.   -Aim for around 60 grams of carbohydrates per meal but listen to your body and if you are not hungry for this that is fine as long as you are not having low blood sugar or overeating later in the day as a result. Choose vegetables (raw or steamed with each meal). -Keep your snacks in the kitchen and choose a portion if you are hungry.  Avoid eating when you are bored or stressed.  What else can you do instead. -Little bits of exercise add up.  Get up and move around every 30 minutes during the day. -What can you do for better sleep.  Consider discussing the timing of your medication with the diuretic with your pharmacist or MD. -Mindful choices and eating habits.

## 2019-10-25 NOTE — Progress Notes (Signed)
Diabetes Self-Management Education  Visit Type: First/Initial  Appt. Start Time: 1040 Appt. End Time: 1150  10/27/2019  [name], identified by name and date of birth, is a 68 y.o. male with a diagnosis of Diabetes: Type 2.   ASSESSMENT  Weight (!) 358 lb (162.4 kg). Body mass index is 57.35 kg/m.  Diabetes Self-Management Education - 10/25/19 1103      Visit Information   Visit Type  First/Initial      Initial Visit   Diabetes Type  Type 2    Are you currently following a meal plan?  No    Are you taking your medications as prescribed?  Yes    Date Diagnosed  2009   insulin since 2014     Health Coping   How would you rate your overall health?  Good      Psychosocial Assessment   Patient Belief/Attitude about Diabetes  Motivated to manage diabetes    Self-management support  Doctor's office;Family;CDE visits    Other persons present  Patient    Patient Concerns  Nutrition/Meal planning;Glycemic Control;Weight Control    Special Needs  None    Preferred Learning Style  No preference indicated    Learning Readiness  Ready    How often do you need to have someone help you when you read instructions, pamphlets, or other written materials from your doctor or pharmacy?  1 - Never    What is the last grade level you completed in school?  some college      Pre-Education Assessment   Patient understands the diabetes disease and treatment process.  Needs Review    Patient understands incorporating nutritional management into lifestyle.  Needs Review    Patient undertands incorporating physical activity into lifestyle.  Needs Review    Patient understands using medications safely.  Needs Review    Patient understands monitoring blood glucose, interpreting and using results  Needs Review    Patient understands prevention, detection, and treatment of acute complications.  Needs Review    Patient understands prevention, detection, and treatment of chronic  complications.  Needs Review    Patient understands how to develop strategies to address psychosocial issues.  Needs Review    Patient understands how to develop strategies to promote health/change behavior.  Needs Review      Complications   Last HgB A1C per patient/outside source  6.9 %   09/2019 decreased from 7.6% 05/2019   How often do you check your blood sugar?  1-2 times/day    Fasting Blood glucose range (mg/dL)  70-129;130-179;180-200;>200    Number of hypoglycemic episodes per month  0    Number of hyperglycemic episodes per week  7    Can you tell when your blood sugar is high?  No    Have you had a dilated eye exam in the past 12 months?  Yes    Have you had a dental exam in the past 12 months?  No    Are you checking your feet?  Yes    How many days per week are you checking your feet?  7      Dietary Intake   Breakfast  1/2 cup pineapple, V-8 tomato juice    Lunch  PB&sugar free J sandwich on white bread OR ham and egg sandwich OR bagel OR OR bagel dog OR sub pizza OR Spanish Rice and beans and pork roast OR [place], stuffing, [place] mixed vegetables    Snack (afternoon)  cashews (  container lasts 2 weeks) potato chips (1 bag lasts 1 week)   eats at his computer due to boredome   Dinner  [place] and cheese sandwich on [place] with a green salad  with fat free [place] dressing or pasta salad OR Marie Calendar [place]key Shepard's pie OR Chef salad    Snack (evening)  cashews or potatoe chips or pretzels    Beverage(s)  diet soda, water, diet snapple      Exercise   Exercise Type  Light (walking / raking leaves)    How many days per week to you exercise?  2    How many minutes per day do you exercise?  10    Total minutes per week of exercise  20      Patient Education   Previous Diabetes Education  Yes (please comment)   2005   Disease state   Other (comment)   review of insulin resistance   Nutrition management   Role of diet in the treatment of diabetes and the  relationship between the three main macronutrients and blood glucose level;Food label reading, portion sizes and measuring food.;Meal timing in regards to the patients' current diabetes medication.;Meal options for control of blood glucose level and chronic complications.    Physical activity and exercise   Role of exercise on diabetes management, blood pressure control and cardiac health.;Helped patient identify appropriate exercises in relation to his/her diabetes, diabetes complications and other health issue.    Medications  Taught/reviewed insulin injection, site rotation, insulin storage and needle disposal.;Reviewed patients medication for diabetes, action, purpose, timing of dose and side effects.    Monitoring  Purpose and frequency of SMBG.;Identified appropriate SMBG and/or A1C goals.    Acute complications  Taught treatment of hypoglycemia - the 15 rule.    Chronic complications  Relationship between chronic complications and blood glucose control    Psychosocial adjustment  Worked with patient to identify barriers to care and solutions;Role of stress on diabetes;Identified and addressed patients feelings and concerns about diabetes    Personal strategies to promote health  Lifestyle issues that need to be addressed for better diabetes care      Individualized Goals (developed by patient)   Nutrition  General guidelines for healthy choices and portions discussed    Physical Activity  Exercise 3-5 times per week;15 minutes per day    Medications  take my medication as prescribed    Monitoring   test my blood glucose as discussed    Reducing Risk  examine blood glucose patterns;increase portions of olive oil in diet    Health Coping  discuss diabetes with (comment)   MD, RD, CDE      Individualized Plan for Diabetes Self-Management Training:   Learning Objective:  Patient will have a greater understanding of diabetes self-management. Patient education plan is to attend individual  and/or group sessions per assessed needs and concerns.   Plan:   Patient Instructions  -Aim to have dinner before 7 pm. -Aim for a routine with your meal schedule.   -Aim for around 60 grams of carbohydrates per meal but listen to your body and if you are not hungry for this that is fine as long as you are not having low blood sugar or overeating later in the day as a result. Choose vegetables (raw or steamed with each meal). -Keep your snacks in the kitchen and choose a portion if you are hungry.  Avoid eating when you are bored or stressed.  What else  can you do instead. -Little bits of exercise add up.  Get up and move around every 30 minutes during the day. -What can you do for better sleep.  Consider discussing the timing of your medication with the diuretic with your pharmacist or MD. -Mindful choices and eating habits.   Expected Outcomes:     Education material provided: ADA - How to Thrive: A Guide for Your Journey with Diabetes, Meal plan card and Snack sheet  If problems or questions, patient to contact team via:  Phone and Email  Future DSME appointment:

## 2020-01-07 ENCOUNTER — Other Ambulatory Visit: Payer: Self-pay

## 2020-01-09 ENCOUNTER — Other Ambulatory Visit: Payer: Self-pay

## 2020-01-09 ENCOUNTER — Encounter: Payer: Self-pay | Admitting: Internal Medicine

## 2020-01-09 ENCOUNTER — Ambulatory Visit (INDEPENDENT_AMBULATORY_CARE_PROVIDER_SITE_OTHER): Payer: PRIVATE HEALTH INSURANCE | Admitting: Internal Medicine

## 2020-01-09 VITALS — BP 140/82 | HR 76 | Ht 66.25 in | Wt 355.0 lb

## 2020-01-09 DIAGNOSIS — E785 Hyperlipidemia, unspecified: Secondary | ICD-10-CM

## 2020-01-09 DIAGNOSIS — E1142 Type 2 diabetes mellitus with diabetic polyneuropathy: Secondary | ICD-10-CM | POA: Diagnosis not present

## 2020-01-09 DIAGNOSIS — Z6841 Body Mass Index (BMI) 40.0 and over, adult: Secondary | ICD-10-CM

## 2020-01-09 DIAGNOSIS — E1165 Type 2 diabetes mellitus with hyperglycemia: Secondary | ICD-10-CM | POA: Diagnosis not present

## 2020-01-09 DIAGNOSIS — E1169 Type 2 diabetes mellitus with other specified complication: Secondary | ICD-10-CM | POA: Diagnosis not present

## 2020-01-09 DIAGNOSIS — IMO0002 Reserved for concepts with insufficient information to code with codable children: Secondary | ICD-10-CM

## 2020-01-09 LAB — POCT GLYCOSYLATED HEMOGLOBIN (HGB A1C): Hemoglobin A1C: 7.5 % — AB (ref 4.0–5.6)

## 2020-01-09 NOTE — Patient Instructions (Addendum)
Please continue: - Metformin 2000 mg daily with dinner  - Glipizide 10 mg 2x a day  - Lantus 20 units in a.m. and 65 units at bedtime - Humalog 35-35-40 units before the 3 meals  - Trulicity 3 mg weekly  Please come back for a follow-up appointment in 4 months.

## 2020-01-09 NOTE — Progress Notes (Signed)
Patient ID: Christian [name], male   DOB: Jun 12, 1951, 69 y.o.   MRN: [id number]  This visit occurred during the SARS-CoV-2 public health emergency.  Safety protocols were in place, including screening questions prior to the visit, additional usage of staff PPE, and extensive cleaning of exam room while observing appropriate contact time as indicated for disinfecting solutions.   HPI Christian [name] is a 69 y.o.-year-old male, returning for f/u for DM2, dx 2009, insulin-dependent since 2014, uncontrolled, with complications (DR, Peripheral neuropathy). Last visit: 4 months ago. [name] [employer].    Reviewed HbA1c levels: Lab Results  Component Value Date   HGBA1C 6.9 (A) 09/09/2019   HGBA1C 7.6 (A) 05/08/2019   HGBA1C 8.2 (A) 01/08/2019   HGBA1C 7.7 (A) 09/25/2018   HGBA1C 7.4 (A) 05/25/2018   HGBA1C 9.0 02/22/2018   HGBA1C 9.1 11/23/2017   HGBA1C 9 08/24/2017   HGBA1C 8.4 05/25/2017   HGBA1C 8.8 02/06/2017   HGBA1C 8.8 11/08/2016   HGBA1C 7.3 08/03/2016   HGBA1C 7.7 05/03/2016   HGBA1C 7.6 01/27/2016   HGBA1C 7.4 10/29/2015   HGBA1C 6.8 07/30/2015   HGBA1C 7.4 (H) 02/16/2015   HGBA1C 7.9 (H) 11/05/2014   HGBA1C 8.1 (H) 07/15/2014   HGBA1C 10.8 (H) 03/24/2014  08/24/2017: HbA1c 9%  Pt is on: - Metformin 1000 mg 2x a daily >> 2000 mg with dinner - Glipizide 10 mg 2x a day  - Trulicity 1.5 >> 3 mg weekly - Lantus 20 units in a.m. and 65 units at bedtime - Humalog 30-35 >> 30-40 units 3x a day before meals >> 35-35-40 units Stopped Jardiance 25 mg >> but too expensive - in the donut hole; had yeast infections. Glipizide and Invokana were started 03/2014.  He has been on Victoza in the past >> not efficient anymore.  Pt checks his sugars  0 to once a day: - am: 91, 113-266 >> 90, 119-165, 278 >> 90, 111-150, 170, 208, 248, 278 (Covid vaccine) - 2h after b'fast: n/c >> 157 >> n/c - before lunch: 280 >> 125 >> 190-200 >> n/c - 2h after lunch: 230-235 >> 220-230 >> n/c >>  190-220 >> n/c - before dinner: 180s >> n/c >> 200s >> n/c  - 2h after dinner: n/c  - bedtime:  230s, 318 >> n/c >> 165-180 >> n/c - nighttime: n/c Lowest sugar was 90 >> 90s, he has hypoglycemia awareness in the 90s Highest sugar was 278 (after ham sandwich, in am) >> 278 (Covid vaccine).  Meter: OneTouch Ultra Mini >> [name]  Pt's meals are: - Brunch: dry cereal + raisins + no milk; misc. Fruit (banana/orange); V8 juice; - Dinner: usually frozen meal  >> salads - Snacks: 2-3 nabs  -No CKD, last BUN/creatinine:  Lab Results  Component Value Date   BUN 21 09/09/2019   CREATININE 0.93 09/09/2019  Not on an ACE inhibitor/ARB.  Latest ACR was normal: Lab Results  Component Value Date   MICRALBCREAT 0.7 09/09/2019   MICRALBCREAT 1.1 08/31/2017   MICRALBCREAT 0.8 08/20/2015   MICRALBCREAT 0.3 09/11/2013   Latest GFR was normal: Lab Results  Component Value Date   GFRNONAA 84 09/09/2019   GFRNONAA >60 05/02/2015   GFRNONAA >60 04/27/2015   -+ HL; last set of lipids: Lab Results  Component Value Date   CHOL 149 09/09/2019   HDL 33.50 (L) 09/09/2019   LDLCALC 85 09/09/2019   TRIG 150.0 (H) 09/09/2019   CHOLHDL 4 09/09/2019  He could not tolerate statins due  to leg cramps. - last eye exam was in 08/2019: Reportedly + DR - + numbness and tingling in his feet  He had R TKR in 04/2015.  He has steroid inj in L knee and L shoulder.  Also uses CBD oil liniment and this helps. He has OSA.  ROS: Constitutional: no weight gain/no weight loss, no fatigue, no subjective hyperthermia, no subjective hypothermia Eyes: no blurry vision, no xerophthalmia ENT: no sore throat, no nodules palpated in neck, no dysphagia, no odynophagia, no hoarseness Cardiovascular: no CP/no SOB/no palpitations/no leg swelling Respiratory: no cough/no SOB/no wheezing Gastrointestinal: no N/no V/no D/no C/no acid reflux Musculoskeletal: no muscle aches/no joint aches Skin: no rashes, no hair  loss Neurological: no tremors/+ numbness/+ tingling/no dizziness  I reviewed pt's medications, allergies, PMH, social hx, family hx, and changes were documented in the history of present illness. Otherwise, unchanged from my initial visit note.   Past Medical History:  Diagnosis Date  . Chronic back pain 09/13/2013  . Chronic pain syndrome 09/13/2013  . GERD (gastroesophageal reflux disease)    hx. of  . Gout 09/13/2013  . Hemorrhoids   . Hypertension   . Morbid obesity with body mass index of 45.0-49.9 in adult [facility]) 09/13/2013  . Osteoarthritis 09/13/2013  . Pneumonia    hx. of  . Seizures (HCC)   . Sleep apnea    has never used C-pap machine due to insurance cost  . Type 1 diabetes mellitus, uncontrolled (HCC)    Past Surgical History:  Procedure Laterality Date  . broken toe 35 years ago    . TOTAL KNEE ARTHROPLASTY Right 05/01/2015   Procedure: RIGHT TOTAL KNEE ARTHROPLASTY;  Surgeon: Kathryne Hitch, MD;  Location: [facility];  Service: Orthopedics;  Laterality: Right;  . WISDOM TOOTH EXTRACTION     Social History   Socioeconomic History  . Marital status: Married    Spouse name: Not on file  . Number of children: Not on file  . Years of education: Not on file  . Highest education level: Not on file  Occupational History  . Not on file  Tobacco Use  . Smoking status: Former Smoker    Quit date: 03/08/2004    Years since quitting: 15.8  . Smokeless tobacco: Never Used  Substance and Sexual Activity  . Alcohol use: No    Comment: rare  . Drug use: No  . Sexual activity: Yes    Partners: Female  Other Topics Concern  . Not on file  Social History Narrative   Psychiatric nurse for [employer]   Married   Social Determinants of Health   Financial Resource Strain:   . Difficulty of Paying Living Expenses: Not on file  Food Insecurity:   . Worried About Programme researcher, broadcasting/film/video in the Last Year: Not on file  . Ran Out of Food in the Last Year: Not on file   Transportation Needs:   . Lack of Transportation (Medical): Not on file  . Lack of Transportation (Non-Medical): Not on file  Physical Activity:   . Days of Exercise per Week: Not on file  . Minutes of Exercise per Session: Not on file  Stress:   . Feeling of Stress : Not on file  Social Connections:   . Frequency of Communication with Friends and Family: Not on file  . Frequency of Social Gatherings with Friends and Family: Not on file  . Attends Religious Services: Not on file  . Active Member of Clubs or  Organizations: Not on file  . Attends Banker Meetings: Not on file  . Marital Status: Not on file  Intimate Partner Violence:   . Fear of Current or Ex-Partner: Not on file  . Emotionally Abused: Not on file  . Physically Abused: Not on file  . Sexually Abused: Not on file   Current Outpatient Medications on File Prior to Visit  Medication Sig Dispense Refill  . aspirin 81 MG tablet Take 81 mg by mouth once.    . bisoprolol-hydrochlorothiazide (ZIAC) 10-6.25 MG tablet Take 1 tablet by mouth daily. 90 tablet 3  . diclofenac (VOLTAREN) 75 MG EC tablet TAKE 1 TABLET BY MOUTH TWO  TIMES DAILY 180 tablet 3  . Dulaglutide (TRULICITY) 3 MG/0.5ML SOPN Inject 3 mg into the skin once a week. 4 pen 11  . glipiZIDE (GLUCOTROL) 10 MG tablet TAKE 1 TABLET BY MOUTH  TWICE A DAY BEFORE MEALS 180 tablet 3  . HYDROcodone-acetaminophen (NORCO/VICODIN) 5-325 MG tablet Take 1-2 tablets by mouth every 6 (six) hours as needed for moderate pain. 40 tablet 0  . Insulin Glargine (LANTUS SOLOSTAR) 100 UNIT/ML Solostar Pen INJECT 20 UNITS IN AM AND 65 UNITS AT NIGHT UNDER SKIN 15 mL 3  . Insulin Lispro (HUMALOG KWIKPEN) 200 UNIT/ML SOPN Inject 35-40 Units into the skin 3 (three) times daily. 45 mL 4  . Insulin Pen Needle 32G X 5 MM MISC USE AS DIRECTED 5 TIMES A DAY WITH INSULIN INJECTIONS E11.65 500 each 5  . metFORMIN (GLUCOPHAGE) 1000 MG tablet Take 1 tablet (1,000 mg total) by mouth 2  (two) times daily with a meal. 180 tablet 3  . oxyCODONE-acetaminophen (PERCOCET/ROXICET) 5-325 MG tablet Take 1 tablet by mouth 2 (two) times daily as needed for severe pain. 20 tablet 0   No current facility-administered medications on file prior to visit.   Allergies  Allergen Reactions  . Penicillins     REACTION: swelling, rash   Family History  Problem Relation Age of Onset  . Alcohol abuse Father   . Arthritis Maternal Grandmother   . Alcohol abuse Paternal Grandmother   . Alcohol abuse Paternal Grandfather   . Diabetes Maternal Aunt     PE: BP 140/82   Pulse 76   Ht 5' 6.25" (1.683 m)   Wt (!) 355 lb (161 kg)   SpO2 97%   BMI 56.87 kg/m  Body mass index is 56.87 kg/m.   Wt Readings from Last 3 Encounters:  01/09/20 (!) 355 lb (161 kg)  10/25/19 (!) 358 lb (162.4 kg)  09/09/19 (!) 352 lb (159.7 kg)   Constitutional: overweight, in NAD Eyes: PERRLA, EOMI, no exophthalmos ENT: moist mucous membranes, no thyromegaly, no cervical lymphadenopathy Cardiovascular: RRR, No MRG, + LE periankle edema L>R Respiratory: CTA B Gastrointestinal: abdomen soft, NT, ND, BS+ Musculoskeletal: no deformities, strength intact in all 4 Skin: moist, warm, + stasis dermatitis ( Neurological: no tremor with outstretched hands, DTR normal in all 4  ASSESSMENT: 1. DM2, insulin-dependent, uncontrolled, with complications - PN - stable, worse in L foot - DR  2. PN  3.  Obesity class III  PLAN:  1. Patient with history of uncontrolled diabetes, on Metformin, sulfonylurea, GLP-1 receptor agonist and basal-bolus insulin regimen, with improved control at last visit (HbA1c returned at 6.9%).  However, at that time, sugars were higher as he was less active and still having dietary indiscretions.  We increased the dose of Trulicity then and I also referred him to nutrition. -  Since last visit, he did see nutrition and also started to make some small changes in his diet.  He is now eating  salads for some of his lunches. -He lost a little bit of weight and he feels that his sugars are better.  Reviewing his records on his phone, he is only checking sugars in the morning and the sugars are indeed, better than at last visit, but he does have occasional spikes in the 170s and even 200s.  Highest spikes were at the time of his Covid vaccine.  At that time, he had chills and feeling poorly. -At this visit, we discussed about continuing the same regimen but trying to check sugars later in the day, also to establish trends - I advised him to: Patient Instructions  Please continue: - Metformin 2000 mg daily with dinner  - Glipizide 10 mg 2x a day  - Lantus 20 units in a.m. and 65 units at bedtime - Humalog 30-40 units 3x a day before meals - Trulicity 3 mg weekly  Please come back for a follow-up appointment in 4 months.  - we checked his HbA1c: 7.5% (higher) - advised to check sugars at different times of the day - 3-4x a day, rotating check times - advised for yearly eye exams >> he is UTD - return to clinic in 4 months    2. HL -Reviewed latest lipid panel from 09/2019: LDL slightly above goal, HDL low Lab Results  Component Value Date   CHOL 149 09/09/2019   HDL 33.50 (L) 09/09/2019   LDLCALC 85 09/09/2019   TRIG 150.0 (H) 09/09/2019   CHOLHDL 4 09/09/2019  -Not tolerating statins due to leg cramps  3.  Obesity class III -We discussed multiple times in the past about trying to change his diet, but he is resistant to suggestions to change.  He accepted a referral to nutrition and saw the nutritionist on 87/68/1157. -Continue Trulicity which should also help with weight loss.  We increased the dose at last visit. -He gained 9 pounds before last visit, lost 3 lbs since then   Philemon Kingdom, MD PhD [facility]

## 2020-01-09 NOTE — Addendum Note (Signed)
Addended by: Darliss Ridgel I on: 01/09/2020 11:48 AM   Modules accepted: Orders

## 2020-01-19 ENCOUNTER — Other Ambulatory Visit: Payer: Self-pay | Admitting: Internal Medicine

## 2020-03-01 ENCOUNTER — Other Ambulatory Visit: Payer: Self-pay | Admitting: Internal Medicine

## 2020-03-11 ENCOUNTER — Other Ambulatory Visit: Payer: Self-pay

## 2020-03-11 ENCOUNTER — Ambulatory Visit (INDEPENDENT_AMBULATORY_CARE_PROVIDER_SITE_OTHER): Payer: PRIVATE HEALTH INSURANCE | Admitting: Orthopaedic Surgery

## 2020-03-11 ENCOUNTER — Encounter: Payer: Self-pay | Admitting: Orthopaedic Surgery

## 2020-03-11 DIAGNOSIS — M1712 Unilateral primary osteoarthritis, left knee: Secondary | ICD-10-CM | POA: Diagnosis not present

## 2020-03-11 DIAGNOSIS — M25562 Pain in left knee: Secondary | ICD-10-CM | POA: Diagnosis not present

## 2020-03-11 DIAGNOSIS — G8929 Other chronic pain: Secondary | ICD-10-CM | POA: Diagnosis not present

## 2020-03-11 MED ORDER — METHYLPREDNISOLONE ACETATE 40 MG/ML IJ SUSP
40.0000 mg | INTRAMUSCULAR | Status: AC | PRN
Start: 2020-03-11 — End: 2020-03-11
  Administered 2020-03-11: 40 mg via INTRA_ARTICULAR

## 2020-03-11 MED ORDER — LIDOCAINE HCL 1 % IJ SOLN
3.0000 mL | INTRAMUSCULAR | Status: AC | PRN
  Administered 2020-03-11: 14:00:00 3 mL

## 2020-03-11 NOTE — Progress Notes (Signed)
Office Visit Note   Patient: Christian [name]           Date of Birth: 08-Feb-1951           MRN: [id number] Visit Date: 03/11/2020              Requested by: Lavada Mesi, MD [id number] [place],  [place] [address] PCP: Lavada Mesi, MD   Assessment & Plan: Visit Diagnoses:  1. Chronic pain of left knee   2. Unilateral primary osteoarthritis, left knee     Plan: I did place a steroid injection in his left knee today without difficulty.  We talked in detail about his knee.  He then started talking about his right shoulder.  He has had an intra-articular injection in his left shoulder under ultrasound by Dr. Prince Rome.  He has seen Dr. Prince Rome in the past as well and I want him to get a follow-up with Dr. Prince Rome to have him evaluate his right shoulder under ultrasound to consider what type of injection may be needed.  He agrees with this treatment plan as well.  Follow-Up Instructions: Return if symptoms worsen or fail to improve.   Orders:  Orders Placed This Encounter  Procedures  . Large Joint Inj   No orders of the defined types were placed in this encounter.     Procedures: Large Joint Inj: L knee on 03/11/2020 1:55 PM Indications: diagnostic evaluation and pain Details: 22 G 1.5 in needle, superolateral approach  Arthrogram: No  Medications: 3 mL lidocaine 1 %; 40 mg methylPREDNISolone acetate 40 MG/ML Outcome: tolerated well, no immediate complications Procedure, treatment alternatives, risks and benefits explained, specific risks discussed. Consent was given by the patient. Immediately prior to procedure a time out was called to verify the correct patient, procedure, equipment, support staff and site/side marked as required. Patient was prepped and draped in the usual sterile fashion.       Clinical Data: No additional findings.   Subjective: Chief Complaint  Patient presents with  . Left Knee - Pain  Patient comes in today basically for steroid injections  left knee.  He has had Synvisc 1 in the past and the last time he was steroid injection was over a year ago in his left knee.  He is someone who is morbidly obese with a weight of 3 and 55 pounds.  He puts a lot of pressure of the left knee.  He does ambulate with a cane.  Steroids have helped in the past.  He is certainly candidate for that again today.  He has had no acute changes in medical status.  The knee does have varus malalignment with global tenderness.  He is a diabetic but is reporting currently good diabetic control.  HPI  Review of Systems He currently denies any headache, chest pain, shortness of breath, fever, chills, nausea, vomiting  Objective: Vital Signs: There were no vitals taken for this visit.  Physical Exam He is alert and orient x3 and in no acute distress Ortho Exam Examination of his left knee does show some slight varus malalignment and painful arc of motion of the knee.  There is patellofemoral crepitation.  The knee is ligamentously stable.  There is no significant effusion. Specialty Comments:  No specialty comments available.  Imaging: No results found.   PMFS History: Patient Active Problem List   Diagnosis Date Noted  . Polyneuropathy associated with underlying disease (HCC) 05/08/2019  . Vitamin D deficiency 01/02/2019  . Chronic  left shoulder pain 01/02/2019  . Acute bronchitis 01/16/2018  . Hyperlipidemia associated with type 2 diabetes mellitus ([place]) 09/03/2017  . Uncontrolled type 2 diabetes mellitus with peripheral neuropathy ([place]) 01/27/2016  . 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 Gout 09/13/2013  . Chronic pain syndrome 09/13/2013  . Osteoarthritis 09/13/2013  . Chronic back pain 09/13/2013  . Morbid obesity with body mass index (BMI) of 50.0 to 59.9 in adult [facility]) 09/13/2013  . Seizures ([place])   . Hypertension    Past Medical History:  Diagnosis Date  . Chronic back pain 09/13/2013  . Chronic pain syndrome 09/13/2013   . GERD (gastroesophageal reflux disease)    hx. of  . Gout 09/13/2013  . Hemorrhoids   . Hypertension   . Morbid obesity with body mass index of 45.0-49.9 in adult [facility]) 09/13/2013  . Osteoarthritis 09/13/2013  . Pneumonia    hx. of  . Seizures ([place])   . Sleep apnea    has never used C-pap machine due to insurance cost  . Type 1 diabetes mellitus, uncontrolled ([place])     Family History  Problem Relation Age of Onset  . Alcohol abuse Father   . Arthritis Maternal Grandmother   . Alcohol abuse Paternal Grandmother   . Alcohol abuse Paternal Grandfather   . Diabetes Maternal Aunt     Past Surgical History:  Procedure Laterality Date  . broken toe 35 years ago    . TOTAL KNEE ARTHROPLASTY Right 05/01/2015   Procedure: RIGHT TOTAL KNEE ARTHROPLASTY;  Surgeon: Mcarthur Rossetti, MD;  Location: [facility];  Service: Orthopedics;  Laterality: Right;  . WISDOM TOOTH EXTRACTION     Social History   Occupational History  . Not on file  Tobacco Use  . Smoking status: Former Smoker    Quit date: 03/08/2004    Years since quitting: 16.0  . Smokeless tobacco: Never Used  Substance and Sexual Activity  . Alcohol use: No    Comment: rare  . Drug use: No  . Sexual activity: Yes    Partners: Female

## 2020-03-16 ENCOUNTER — Ambulatory Visit (INDEPENDENT_AMBULATORY_CARE_PROVIDER_SITE_OTHER): Payer: PRIVATE HEALTH INSURANCE | Admitting: Family Medicine

## 2020-03-16 ENCOUNTER — Encounter: Payer: Self-pay | Admitting: Family Medicine

## 2020-03-16 ENCOUNTER — Other Ambulatory Visit: Payer: Self-pay

## 2020-03-16 DIAGNOSIS — M25511 Pain in right shoulder: Secondary | ICD-10-CM

## 2020-03-16 DIAGNOSIS — R600 Localized edema: Secondary | ICD-10-CM

## 2020-03-16 DIAGNOSIS — G8929 Other chronic pain: Secondary | ICD-10-CM

## 2020-03-16 DIAGNOSIS — K429 Umbilical hernia without obstruction or gangrene: Secondary | ICD-10-CM | POA: Diagnosis not present

## 2020-03-16 NOTE — Progress Notes (Signed)
No known injury Right shoulder pain No neck pain or numbness and tingling  Patient is a diabetic and is also complaining of bilateral foot/ankle pain and swelling  HX of gout

## 2020-03-16 NOTE — Progress Notes (Signed)
Christian [name] - 69 y.o. male MRN [id number]  Date of birth: Feb 12, 1951  Office Visit Note: Visit Date: 03/16/2020 PCP: Eunice Blase, MD Referred by: Eunice Blase, MD  Subjective: Chief Complaint  Patient presents with  . Right Shoulder - Pain   HPI: Christian [name] is a 69 y.o. male who comes in today with right shoulder pain.  He reports that his right shoulder has been more painful over the past few months as he has increased use of right shoulder since left shoulder has been painful. He reports pain with overhead movements, worse in the mornings. Last week it was more painful, today does not hurt as much. No numbness or tingling down his arm.   He has been favoring his left shoulder and essentially not using it as he has severe arthritis with no improvement after subacromial injection in February. He does not want another injection in his left shoulder at this time as it does not hurt as long as he does not use it.    ROS Otherwise per HPI.  Assessment & Plan: Visit Diagnoses:  1. Chronic right shoulder pain   2. Umbilical hernia without obstruction and without gangrene   3. Bilateral leg edema     Plan:  1. Right shoulder pain- elected for subacromial steroid injection of right shoulder. Provided rotator cuff exercises to use for home strengthening program. 2. 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 Bilateral leg pain and umbilical hernia- see Dr. Park Liter note  Meds & Orders: No orders of the defined types were placed in this encounter.  No orders of the defined types were placed in this encounter.   Follow-up: No follow-ups on file.   Procedures: Procedure performed: subacromial corticosteroid injection  Noted no overlying erythema, induration, or other signs of local infection. The right posterior subacromial space was palpated and marked. The overlying skin was prepped  in a sterile fashion. Topical analgesic spray: Ethyl chloride. Joint: right subacromial Needle: 25 gauge, 1.5 inch Completed without difficulty. Meds: 5 cc 1% lidocaine without epinephrine, 40 mg methylprednisolone  Clinical History: No specialty comments available.   He reports that he quit smoking about 16 years ago. He has never used smokeless tobacco.  Recent Labs    05/08/19 1057 09/09/19 1056 01/09/20 1148  HGBA1C 7.6* 6.9* 7.5*    Objective:  VS:  HT:    WT:   BMI:     BP:   HR: bpm  TEMP: ( )  RESP:  Physical Exam  PHYSICAL EXAM: Gen: NAD, alert, cooperative with exam, well-appearing HEENT: clear conjunctiva,  CV:  no edema, capillary refill brisk, normal rate Resp: non-labored Skin: no rashes, normal turgor  Neuro: no gross deficits.  Psych:  alert and oriented  Ortho Exam  Right Shoulder: Inspection reveals no obvious deformity, atrophy, or asymmetry. No bruising. No swelling TTP over lateral acromion and lateral arm Full ROM in flexion, abduction, internal/external rotation NV intact distally Normal scapular function observed. Special Tests:  - Impingement: positive Hawkins, neers - Supraspinatous: 5/5 strength with resisted flexion at 20 degrees- pain with resistance - Infraspinatous/Teres Minor: 5/5 strength with ER - Subscapularis: 5/5 strength with IR  Left shoulder - limited ROM in all directions, limited ER Strength diminished with supraspinatus testing  Imaging: No results found.  Past Medical/Family/Surgical/Social History: Medications & Allergies reviewed per EMR, new medications updated. Patient Active Problem List   Diagnosis Date  Noted  . Polyneuropathy associated with underlying disease (HCC) 05/08/2019  . Vitamin D deficiency 01/02/2019  . Chronic left shoulder pain 01/02/2019  . Acute bronchitis 01/16/2018  . Hyperlipidemia associated with type 2 diabetes mellitus (HCC) 09/03/2017  . Uncontrolled type 2 diabetes mellitus with  peripheral neuropathy (HCC) 01/27/2016  . 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 Gout 09/13/2013  . Chronic pain syndrome 09/13/2013  . Osteoarthritis 09/13/2013  . Chronic back pain 09/13/2013  . Morbid obesity with body mass index (BMI) of 50.0 to 59.9 in adult [facility]) 09/13/2013  . Seizures (HCC)   . Hypertension    Past Medical History:  Diagnosis Date  . Chronic back pain 09/13/2013  . Chronic pain syndrome 09/13/2013  . GERD (gastroesophageal reflux disease)    hx. of  . Gout 09/13/2013  . Hemorrhoids   . Hypertension   . Morbid obesity with body mass index of 45.0-49.9 in adult [facility]) 09/13/2013  . Osteoarthritis 09/13/2013  . Pneumonia    hx. of  . Seizures (HCC)   . Sleep apnea    has never used C-pap machine due to insurance cost  . Type 1 diabetes mellitus, uncontrolled (HCC)    Family History  Problem Relation Age of Onset  . Alcohol abuse Father   . Arthritis Maternal Grandmother   . Alcohol abuse Paternal Grandmother   . Alcohol abuse Paternal Grandfather   . Diabetes Maternal Aunt    Past Surgical History:  Procedure Laterality Date  . broken toe 35 years ago    . TOTAL KNEE ARTHROPLASTY Right 05/01/2015   Procedure: RIGHT TOTAL KNEE ARTHROPLASTY;  Surgeon: Kathryne Hitch, MD;  Location: [facility];  Service: Orthopedics;  Laterality: Right;  . WISDOM TOOTH EXTRACTION     Social History   Occupational History  . Not on file  Tobacco Use  . Smoking status: Former Smoker    Quit date: 03/08/2004    Years since quitting: 16.0  . Smokeless tobacco: Never Used  Substance and Sexual Activity  . Alcohol use: No    Comment: rare  . Drug use: No  . Sexual activity: Yes    Partners: Female

## 2020-03-16 NOTE — Progress Notes (Signed)
I saw and examined the patient with Dr. Robby Sermon and agree with assessment and plan as outlined.    Right shoulder has been hurting quite a bit since favoring his left shoulder which never got any better after subacromial injection last February.  He really does not want any treatment for his left shoulder, it does not hurt as long as he is not using it.  We will inject the right shoulder subacromial space today and he will start doing rotator cuff strengthening exercises.  He is also complaining of increased swelling in both of his legs.  He has varicosities and venous stasis changes on the right.  And finally, his umbilical hernia bleeds occasionally when his belt rubs against it.  Otherwise it does not bother him enough to consider surgery right now.

## 2020-04-05 ENCOUNTER — Other Ambulatory Visit: Payer: Self-pay | Admitting: Internal Medicine

## 2020-05-14 ENCOUNTER — Encounter: Payer: Self-pay | Admitting: Endocrinology

## 2020-05-14 ENCOUNTER — Ambulatory Visit (INDEPENDENT_AMBULATORY_CARE_PROVIDER_SITE_OTHER): Payer: PRIVATE HEALTH INSURANCE | Admitting: Internal Medicine

## 2020-05-14 ENCOUNTER — Encounter: Payer: Self-pay | Admitting: Internal Medicine

## 2020-05-14 ENCOUNTER — Other Ambulatory Visit: Payer: Self-pay

## 2020-05-14 VITALS — BP 120/82 | HR 71 | Ht 66.25 in | Wt 346.0 lb

## 2020-05-14 DIAGNOSIS — E1169 Type 2 diabetes mellitus with other specified complication: Secondary | ICD-10-CM

## 2020-05-14 DIAGNOSIS — IMO0002 Reserved for concepts with insufficient information to code with codable children: Secondary | ICD-10-CM

## 2020-05-14 DIAGNOSIS — E1165 Type 2 diabetes mellitus with hyperglycemia: Secondary | ICD-10-CM | POA: Diagnosis not present

## 2020-05-14 DIAGNOSIS — E785 Hyperlipidemia, unspecified: Secondary | ICD-10-CM | POA: Diagnosis not present

## 2020-05-14 DIAGNOSIS — E1142 Type 2 diabetes mellitus with diabetic polyneuropathy: Secondary | ICD-10-CM

## 2020-05-14 DIAGNOSIS — Z6841 Body Mass Index (BMI) 40.0 and over, adult: Secondary | ICD-10-CM

## 2020-05-14 LAB — POCT GLYCOSYLATED HEMOGLOBIN (HGB A1C): Hemoglobin A1C: 7 % — AB (ref 4.0–5.6)

## 2020-05-14 NOTE — Patient Instructions (Signed)
Please continue: - Metformin 2000 mg daily with dinner  - Glipizide 10 mg 2x a day  - Lantus 20 units in a.m. and 65 units at bedtime - Humalog 30-40 units 3x a day before meals - Trulicity 3 mg weekly  Check some sugars later in the day.  Please come back for a follow-up appointment in 4 months.

## 2020-05-14 NOTE — Addendum Note (Signed)
Addended by: Darliss Ridgel I on: 05/14/2020 11:50 AM   Modules accepted: Orders

## 2020-05-14 NOTE — Progress Notes (Signed)
Patient ID: Christian [name], male   DOB: 1951-01-06, 69 y.o.   MRN: [id number]  This visit occurred during the SARS-CoV-2 public health emergency.  Safety protocols were in place, including screening questions prior to the visit, additional usage of staff PPE, and extensive cleaning of exam room while observing appropriate contact time as indicated for disinfecting solutions.   HPI Christian [name] is a 69 y.o.-year-old male, returning for f/u for DM2, dx 2009, insulin-dependent since 2014, uncontrolled, with complications (DR, Peripheral neuropathy). Last visit: 4 months ago. [name] [employer].     He got 3 steroid inj's since last OV  - last 2 mo ago >> sugars 280-300s.  Reviewed HbA1c levels: Lab Results  Component Value Date   HGBA1C 7.5 (A) 01/09/2020   HGBA1C 6.9 (A) 09/09/2019   HGBA1C 7.6 (A) 05/08/2019   HGBA1C 8.2 (A) 01/08/2019   HGBA1C 7.7 (A) 09/25/2018   HGBA1C 7.4 (A) 05/25/2018   HGBA1C 9.0 02/22/2018   HGBA1C 9.1 11/23/2017   HGBA1C 9 08/24/2017   HGBA1C 8.4 05/25/2017   HGBA1C 8.8 02/06/2017   HGBA1C 8.8 11/08/2016   HGBA1C 7.3 08/03/2016   HGBA1C 7.7 05/03/2016   HGBA1C 7.6 01/27/2016   HGBA1C 7.4 10/29/2015   HGBA1C 6.8 07/30/2015   HGBA1C 7.4 (H) 02/16/2015   HGBA1C 7.9 (H) 11/05/2014   HGBA1C 8.1 (H) 07/15/2014  08/24/2017: HbA1c 9%  Pt is on: - Metformin 1000 mg 2x a daily >> 2000 mg with dinner - Glipizide 10 mg 2x a day  - Trulicity 1.5 >> 3 mg weekly - Lantus 20 units in a.m. and 65 units at bedtime - Humalog 30-35 >> 30-40 units 3x a day before meals >> 35-35-40 units before meals Stopped Jardiance 25 mg >> but too expensive - in the donut hole; had yeast infections. Glipizide and Invokana were started 03/2014.  He has been on Victoza in the past >> not efficient anymore.  Pt checks his sugars  0-1x a day - am: 90, 111-150, 170, 208, 248, 278 (Covid vaccine) >> 85, 96, 112-151, 155, 164, 246 - 2h after b'fast: n/c >> 157 >> n/c - before lunch:  280 >> 125 >> 190-200 >> n/c - 2h after lunch: 230-235 >> 220-230 >> n/c >> 190-220 >> n/c - before dinner: 180s >> n/c >> 200s >> n/c  - 2h after dinner: n/c  - bedtime:  230s, 318 >> n/c >> 165-180 >> n/c - nighttime: n/c Lowest sugar was 90 >> 90s >> 85, he has hypoglycemia awareness in the 90s. Highest sugar was 278 (after ham sandwich, in am) >> 278 (Covid vaccine) >> 300s (steroid inj's)   Meter: OneTouch Ultra Mini >> [name]  Pt's meals are: - Brunch: dry cereal + raisins + no milk; misc. Fruit (banana/orange); V8 juice; - Dinner: usually frozen meal  >> salads - Snacks: 2-3 nabs  -No CKD, last BUN/creatinine:  Lab Results  Component Value Date   BUN 21 09/09/2019   CREATININE 0.93 09/09/2019  Not on a ACE inhibitor/ARB.  ACR normal: Lab Results  Component Value Date   MICRALBCREAT 0.7 09/09/2019   MICRALBCREAT 1.1 08/31/2017   MICRALBCREAT 0.8 08/20/2015   MICRALBCREAT 0.3 09/11/2013   GFR levels normal:: Lab Results  Component Value Date   GFRNONAA 84 09/09/2019   GFRNONAA >60 05/02/2015   GFRNONAA >60 04/27/2015   -+ HL; last set of lipids: Lab Results  Component Value Date   CHOL 149 09/09/2019   HDL 33.50 (L) 09/09/2019  LDLCALC 85 09/09/2019   TRIG 150.0 (H) 09/09/2019   CHOLHDL 4 09/09/2019  He could not tolerate statins due to leg cramps. - last eye exam was in 08/2019: Reportedly + DR - + numbness and tingling in his feet  He had R TKR in 04/2015.  He has steroid inj in L knee and L shoulder.  Also uses CBD oil liniment and this helps. He has OSA.  ROS: Constitutional: no weight gain/no weight loss, no fatigue, no subjective hyperthermia, no subjective hypothermia Eyes: no blurry vision, no xerophthalmia ENT: no sore throat, no nodules palpated in neck, no dysphagia, no odynophagia, no hoarseness Cardiovascular: no CP/no SOB/no palpitations/no leg swelling Respiratory: no cough/no SOB/no wheezing Gastrointestinal: no N/no V/no D/no C/no acid  reflux Musculoskeletal: no muscle aches/no joint aches Skin: no rashes, no hair loss Neurological: no tremors/+ numbness/+ tingling/no dizziness  I reviewed pt's medications, allergies, PMH, social hx, family hx, and changes were documented in the history of present illness. Otherwise, unchanged from my initial visit note.   Past Medical History:  Diagnosis Date  . Chronic back pain 09/13/2013  . Chronic pain syndrome 09/13/2013  . GERD (gastroesophageal reflux disease)    hx. of  . Gout 09/13/2013  . Hemorrhoids   . Hypertension   . Morbid obesity with body mass index of 45.0-49.9 in adult [facility]) 09/13/2013  . Osteoarthritis 09/13/2013  . Pneumonia    hx. of  . Seizures (HCC)   . Sleep apnea    has never used C-pap machine due to insurance cost  . Type 1 diabetes mellitus, uncontrolled (HCC)    Past Surgical History:  Procedure Laterality Date  . broken toe 35 years ago    . TOTAL KNEE ARTHROPLASTY Right 05/01/2015   Procedure: RIGHT TOTAL KNEE ARTHROPLASTY;  Surgeon: Kathryne Hitch, MD;  Location: [facility];  Service: Orthopedics;  Laterality: Right;  . WISDOM TOOTH EXTRACTION     Social History   Socioeconomic History  . Marital status: Married    Spouse name: Not on file  . Number of children: Not on file  . Years of education: Not on file  . Highest education level: Not on file  Occupational History  . Not on file  Tobacco Use  . Smoking status: Former Smoker    Quit date: 03/08/2004    Years since quitting: 16.1  . Smokeless tobacco: Never Used  Substance and Sexual Activity  . Alcohol use: No    Comment: rare  . Drug use: No  . Sexual activity: Yes    Partners: Female  Other Topics Concern  . Not on file  Social History Narrative   Psychiatric nurse for [employer]   Married   Social Determinants of Health   Financial Resource Strain:   . Difficulty of Paying Living Expenses:   Food Insecurity:   . Worried About Programme researcher, broadcasting/film/video in the Last Year:   .  Barista in the Last Year:   Transportation Needs:   . Freight forwarder (Medical):   Marland Kitchen Lack of Transportation (Non-Medical):   Physical Activity:   . Days of Exercise per Week:   . Minutes of Exercise per Session:   Stress:   . Feeling of Stress :   Social Connections:   . Frequency of Communication with Friends and Family:   . Frequency of Social Gatherings with Friends and Family:   . Attends Religious Services:   . Active Member of Clubs or Organizations:   .  Attends BankerClub or Organization Meetings:   Marland Kitchen. Marital Status:   Intimate Partner Violence:   . Fear of Current or Ex-Partner:   . Emotionally Abused:   Marland Kitchen. Physically Abused:   . Sexually Abused:    Current Outpatient Medications on File Prior to Visit  Medication Sig Dispense Refill  . aspirin 81 MG tablet Take 81 mg by mouth once.    . bisoprolol-hydrochlorothiazide (ZIAC) 10-6.25 MG tablet Take 1 tablet by mouth daily. 90 tablet 3  . diclofenac (VOLTAREN) 75 MG EC tablet TAKE 1 TABLET BY MOUTH TWO  TIMES DAILY 180 tablet 3  . Dulaglutide (TRULICITY) 3 MG/0.5ML SOPN Inject 3 mg into the skin once a week. 4 pen 11  . glipiZIDE (GLUCOTROL) 10 MG tablet TAKE 1 TABLET BY MOUTH  TWICE A DAY BEFORE MEALS 180 tablet 3  . HUMALOG KWIKPEN 200 UNIT/ML KwikPen INJECT 25 TO 35 UNITS INTO THE SKIN 3 TIMES DAILY. 48 pen 4  . HYDROcodone-acetaminophen (NORCO/VICODIN) 5-325 MG tablet Take 1-2 tablets by mouth every 6 (six) hours as needed for moderate pain. 40 tablet 0  . Insulin Glargine (LANTUS SOLOSTAR) 100 UNIT/ML Solostar Pen INJECT 20 UNITS IN AM AND 65 UNITS AT NIGHT UNDER SKIN 15 mL 3  . Insulin Pen Needle (NOVOTWIST) 32G X 5 MM MISC USE AS DIRECTED 5 TIMES A DAY WITH INSULIN INJECTIONS E11.65 500 each 5  . metFORMIN (GLUCOPHAGE) 1000 MG tablet TAKE 1 TABLET BY MOUTH  TWICE DAILY WITH A MEAL 180 tablet 3  . oxyCODONE-acetaminophen (PERCOCET/ROXICET) 5-325 MG tablet Take 1 tablet by mouth 2 (two) times daily as needed for  severe pain. 20 tablet 0   No current facility-administered medications on file prior to visit.   Allergies  Allergen Reactions  . Penicillins     REACTION: swelling, rash   Family History  Problem Relation Age of Onset  . Alcohol abuse Father   . Arthritis Maternal Grandmother   . Alcohol abuse Paternal Grandmother   . Alcohol abuse Paternal Grandfather   . Diabetes Maternal Aunt     PE: BP 120/82   Pulse 71   Ht 5' 6.25" (1.683 m)   Wt (!) 346 lb (156.9 kg)   SpO2 96%   BMI 55.43 kg/m  Body mass index is 55.43 kg/m.   Wt Readings from Last 3 Encounters:  05/14/20 (!) 346 lb (156.9 kg)  01/09/20 (!) 355 lb (161 kg)  10/25/19 (!) 358 lb (162.4 kg)   Constitutional: overweight, in NAD Eyes: PERRLA, EOMI, no exophthalmos ENT: moist mucous membranes, no thyromegaly, no cervical lymphadenopathy Cardiovascular: RRR, No MRG, + LE periankle edema L>R Respiratory: CTA B Gastrointestinal: abdomen soft, NT, ND, BS+ Musculoskeletal: no deformities, strength intact in all 4 Skin: moist, warm, + stasis dermatitis Neurological: no tremor with outstretched hands, DTR normal in all 4  ASSESSMENT: 1. DM2, insulin-dependent, uncontrolled, with complications - PN - stable, worse in L foot - DR  2. PN  3.  Obesity class III  PLAN:  1. Patient with history of uncontrolled diabetes, on a complex medication regimen including oral medications: Metformin, sulfonylurea, also weekly GLP-1 receptor agonist and basal-bolus insulin regimen, with still poor control.  Latest HbA1c was higher, at 7.5%.  At last visit, he just saw nutrition and started to make some small changes in his diet.  He was eating salads for some of his lunches.  He also lost a little bit of weight and felt that his sugars were better.  He  had occasional spikes in the 170s and even 200s, especially the time of his Covid vaccine.  We did not change his regimen at that time.  I did advise him to try to check some sugars  later in the day to establish trends. -Since last visit, he did not check sugars later in the day, and the same as at last visit. -He continues to keep an eye on his diet and he tells me that he eats salads frequently -He was able to lose some weight since last visit but is unclear whether sugars later in the day have improved.  I again strongly advised him to start checking before meals and at bedtime, rotating check times.  Otherwise, I do not feel we need to change his regimen especially since his HbA1c appears improved today: HbA1c: 7.0%. - I advised him to: Patient Instructions  Please continue: - Metformin 2000 mg daily with dinner  - Glipizide 10 mg 2x a day  - Lantus 20 units in a.m. and 65 units at bedtime - Humalog 30-40 units 3x a day before meals - Trulicity 3 mg weekly  Please come back for a follow-up appointment in 4 months.  - advised to check sugars at different times of the day - 3x a day, rotating check times - advised for yearly eye exams >> he is UTD - return to clinic in 4 months    2. HL -Reviewed latest lipid panel from 09/2019: LDL slightly above goal, HDL low: Lab Results  Component Value Date   CHOL 149 09/09/2019   HDL 33.50 (L) 09/09/2019   LDLCALC 85 09/09/2019   TRIG 150.0 (H) 09/09/2019   CHOLHDL 4 09/09/2019  -Not tolerating statins due to leg cramps.  3.  Obesity class III -We discussed multiple times in the past about trying to change his diet, but he is resistant to suggestions to change.  He accepted a referral to nutrition and saw the nutritionist on 10/25/2019. -Continue Trulicity which should also help with weight loss -He lost 3 pounds before last visit but gained 9 pounds before the previous visit  - since last visit, he lost 9 lbs!  Carlus Pavlov, MD PhD [facility] Endocrinology

## 2020-06-27 ENCOUNTER — Other Ambulatory Visit: Payer: Self-pay | Admitting: Family Medicine

## 2020-07-12 ENCOUNTER — Other Ambulatory Visit: Payer: Self-pay | Admitting: Internal Medicine

## 2020-08-14 ENCOUNTER — Other Ambulatory Visit: Payer: Self-pay | Admitting: Internal Medicine

## 2020-08-26 ENCOUNTER — Other Ambulatory Visit: Payer: Self-pay | Admitting: Family Medicine

## 2020-08-27 ENCOUNTER — Other Ambulatory Visit: Payer: Self-pay

## 2020-08-27 ENCOUNTER — Encounter: Payer: Self-pay | Admitting: Family Medicine

## 2020-08-27 ENCOUNTER — Ambulatory Visit (INDEPENDENT_AMBULATORY_CARE_PROVIDER_SITE_OTHER): Payer: PRIVATE HEALTH INSURANCE | Admitting: Family Medicine

## 2020-08-27 VITALS — BP 127/61 | HR 75

## 2020-08-27 DIAGNOSIS — K429 Umbilical hernia without obstruction or gangrene: Secondary | ICD-10-CM

## 2020-08-27 DIAGNOSIS — R059 Cough, unspecified: Secondary | ICD-10-CM

## 2020-08-27 MED ORDER — MONTELUKAST SODIUM 10 MG PO TABS: 10.0000 mg | ORAL_TABLET | Freq: Every evening | ORAL | 6 refills | Status: DC | PRN

## 2020-08-27 NOTE — Progress Notes (Signed)
Office Visit Note   Patient: Christian [name]           Date of Birth: 09/13/1951           MRN: [id number] Visit Date: 08/27/2020 Requested by: Lavada Mesi, MD [id number] [place],  [place] [address] PCP: Lavada Mesi, MD  Subjective: Chief Complaint  Patient presents with  . recheck umbilical hernia  . Cough    recurrent, mostly at night    HPI: He is here with 2 concerns.  His older sister, who essentially raised him after his mother died, passed away recently.  She had an umbilical hernia that apparently resulted in a rupture: And sepsis.  She also had dementia.  Because of the fact that he has an umbilical hernia as well, his wife wanted him to be evaluated.  He is still pretty much asymptomatic from this standpoint, although it does get irritated a lot from rubbing against his belt.  Also in the past few weeks he has had a cough mainly at night with some phlegm.  No fevers or chills, no shortness of breath.  No significant daytime symptoms.                ROS:   All other systems were reviewed and are negative.  Objective: Vital Signs: BP 127/61   Pulse 75   Physical Exam:  General:  Alert and oriented, in no acute distress. Pulm:  Breathing unlabored. Psy:  Normal mood, congruent affect. Skin: There is a scab over his umbilical hernia.  No sign of cellulitis. CV: Regular rate and rhythm without murmurs, rubs, or gallops.  No peripheral edema.  2+ radial and posterior tibial pulses. Lungs: Clear to auscultation throughout with no wheezing or areas of consolidation. Abdomen: He has an umbilical hernia which measures about 2 to 3 cm diameter.  It is easily reducible.   Imaging: No results found.  Assessment & Plan: 1.  Umbilical hernia -Referral to Dr. Carman Ching for surgical consultation.  2.  Cough, etiology uncertain.  Could be allergies. -Trial of Singulair.     Procedures: No procedures performed  No notes on file     PMFS  History: Patient Active Problem List   Diagnosis Date Noted  . Polyneuropathy associated with underlying disease (HCC) 05/08/2019  . Vitamin D deficiency 01/02/2019  . Chronic left shoulder pain 01/02/2019  . Acute bronchitis 01/16/2018  . Hyperlipidemia associated with type 2 diabetes mellitus (HCC) 09/03/2017  . Uncontrolled type 2 diabetes mellitus with peripheral neuropathy (HCC) 01/27/2016  . 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 Gout 09/13/2013  . Chronic pain syndrome 09/13/2013  . Osteoarthritis 09/13/2013  . Chronic back pain 09/13/2013  . Morbid obesity with body mass index (BMI) of 50.0 to 59.9 in adult [facility]) 09/13/2013  . Seizures (HCC)   . Hypertension    Past Medical History:  Diagnosis Date  . Chronic back pain 09/13/2013  . Chronic pain syndrome 09/13/2013  . GERD (gastroesophageal reflux disease)    hx. of  . Gout 09/13/2013  . Hemorrhoids   . Hypertension   . Morbid obesity with body mass index of 45.0-49.9 in adult [facility]) 09/13/2013  . Osteoarthritis 09/13/2013  . Pneumonia    hx. of  . Seizures (HCC)   . Sleep apnea    has never used C-pap machine due to insurance cost  . Type 1 diabetes mellitus, uncontrolled (HCC)     Family History  Problem Relation Age of  Onset  . Alcohol abuse Father   . Arthritis Maternal Grandmother   . Alcohol abuse Paternal Grandmother   . Alcohol abuse Paternal Grandfather   . Diabetes Maternal Aunt     Past Surgical History:  Procedure Laterality Date  . broken toe 35 years ago    . TOTAL KNEE ARTHROPLASTY Right 05/01/2015   Procedure: RIGHT TOTAL KNEE ARTHROPLASTY;  Surgeon: Kathryne Hitch, MD;  Location: [facility];  Service: Orthopedics;  Laterality: Right;  . WISDOM TOOTH EXTRACTION     Social History   Occupational History  . Not on file  Tobacco Use  . Smoking status: Former Smoker    Quit date: 03/08/2004    Years since quitting: 16.4  . Smokeless tobacco: Never Used  Substance and Sexual  Activity  . Alcohol use: No    Comment: rare  . Drug use: No  . Sexual activity: Yes    Partners: Female

## 2020-09-08 ENCOUNTER — Ambulatory Visit (INDEPENDENT_AMBULATORY_CARE_PROVIDER_SITE_OTHER): Payer: PRIVATE HEALTH INSURANCE | Admitting: Internal Medicine

## 2020-09-08 ENCOUNTER — Encounter: Payer: Self-pay | Admitting: Internal Medicine

## 2020-09-08 ENCOUNTER — Other Ambulatory Visit: Payer: Self-pay

## 2020-09-08 VITALS — BP 140/84 | HR 74 | Ht 66.5 in | Wt 352.2 lb

## 2020-09-08 DIAGNOSIS — Z6841 Body Mass Index (BMI) 40.0 and over, adult: Secondary | ICD-10-CM

## 2020-09-08 DIAGNOSIS — E1165 Type 2 diabetes mellitus with hyperglycemia: Secondary | ICD-10-CM

## 2020-09-08 DIAGNOSIS — E785 Hyperlipidemia, unspecified: Secondary | ICD-10-CM

## 2020-09-08 DIAGNOSIS — E1142 Type 2 diabetes mellitus with diabetic polyneuropathy: Secondary | ICD-10-CM | POA: Diagnosis not present

## 2020-09-08 DIAGNOSIS — IMO0002 Reserved for concepts with insufficient information to code with codable children: Secondary | ICD-10-CM

## 2020-09-08 DIAGNOSIS — E1169 Type 2 diabetes mellitus with other specified complication: Secondary | ICD-10-CM

## 2020-09-08 LAB — POCT GLYCOSYLATED HEMOGLOBIN (HGB A1C): Hemoglobin A1C: 7.8 % — AB (ref 4.0–5.6)

## 2020-09-08 NOTE — Patient Instructions (Signed)
Please continue: - Metformin 2000 mg daily with dinner  - Glipizide 10 mg 2x a day  - Lantus 20 units in a.m. and 65 units at bedtime - Humalog 30-40 units 3x a day before meals - Trulicity 3 mg weekly  Try not to miss insulin injections. Try not to skip meals.  Please come back for a follow-up appointment in 4 months.

## 2020-09-08 NOTE — Progress Notes (Signed)
Patient ID: ADONIJAH BAENA, male   DOB: [birth year], 69 y.o.   MRN: [id number]  This visit occurred during the SARS-CoV-2 public health emergency.  Safety protocols were in place, including screening questions prior to the visit, additional usage of staff PPE, and extensive cleaning of exam room while observing appropriate contact time as indicated for disinfecting solutions.   HPI KEEVEN MATTY is a 69 y.o.-year-old male, returning for f/u for DM2, dx 2009, insulin-dependent since 2014, uncontrolled, with complications (DR, Peripheral neuropathy). Last visit: 4 months ago. [name] + [employer].    His sister and brother in law died recently.  He had more stress since last visit and missed many medication doses and also did not have consistent meals.  Sugars are higher.  Reviewed HbA1c levels: Lab Results  Component Value Date   HGBA1C 7.0 (A) 05/14/2020   HGBA1C 7.5 (A) 01/09/2020   HGBA1C 6.9 (A) 09/09/2019   HGBA1C 7.6 (A) 05/08/2019   HGBA1C 8.2 (A) 01/08/2019   HGBA1C 7.7 (A) 09/25/2018   HGBA1C 7.4 (A) 05/25/2018   HGBA1C 9.0 02/22/2018   HGBA1C 9.1 11/23/2017   HGBA1C 9 08/24/2017   HGBA1C 8.4 05/25/2017   HGBA1C 8.8 02/06/2017   HGBA1C 8.8 11/08/2016   HGBA1C 7.3 08/03/2016   HGBA1C 7.7 05/03/2016   HGBA1C 7.6 01/27/2016   HGBA1C 7.4 10/29/2015   HGBA1C 6.8 07/30/2015   HGBA1C 7.4 (H) 02/16/2015   HGBA1C 7.9 (H) 11/05/2014  08/24/2017: HbA1c 9%  Pt is on: - Metformin 1000 mg 2x a daily >> 2000 mg with dinner - Glipizide 10 mg 2x a day  - Trulicity 1.5 >> 3 mg weekly - Lantus 20 units in a.m. and 65 units at bedtime - Humalog 30-35 >> 30-40 units 3x a day before meals >> 35-35-40 units before meals Stopped Jardiance 25 mg >> but too expensive - in the donut hole; had yeast infections. Glipizide and Invokana were started 03/2014.  He has been on Victoza in the past >> not efficient anymore.  Pt checks his sugars 0 to once a day: - am: 90, 111-150, 170, 208, 248, 278  >> 85, 96, 112-151, 155, 164, 246 >> 108, 144-221, 266 - 2h after b'fast: n/c >> 157 >> n/c - before lunch: 280 >> 125 >> 190-200 >> n/c - 2h after lunch: 230-235 >> 220-230 >> n/c >> 190-220 >> n/c - before dinner: 180s >> n/c >> 200s >> n/c  - 2h after dinner: n/c  - bedtime:  230s, 318 >> n/c >> 165-180 >> n/c >> 230, 281 - nighttime: n/c  Lowest sugar was 90s >> 85 >> 85, he has hypoglycemia awareness in the 90s. Highest sugar was 278 (Covid vaccine) >> 300s (steroid inj's) >> 280s.  Meter: OneTouch Ultra Mini >> [name]  -No CKD, last BUN/creatinine:  Lab Results  Component Value Date   BUN 21 09/09/2019   CREATININE 0.93 09/09/2019  Not on ACE inhibitor/ARB.  Normal ACR: Lab Results  Component Value Date   MICRALBCREAT 0.7 09/09/2019   MICRALBCREAT 1.1 08/31/2017   MICRALBCREAT 0.8 08/20/2015   MICRALBCREAT 0.3 09/11/2013   Normal GFR levels: Lab Results  Component Value Date   GFRNONAA 84 09/09/2019   GFRNONAA >60 05/02/2015   GFRNONAA >60 04/27/2015   -+ HL; last set of lipids: Lab Results  Component Value Date   CHOL 149 09/09/2019   HDL 33.50 (L) 09/09/2019   LDLCALC 85 09/09/2019   TRIG 150.0 (H) 09/09/2019   CHOLHDL 4 09/09/2019  He could not tolerate statins due to leg cramps. - last eye exam was in 08/2019: Reportedly + DR - + numbness and tingling in his feet  He had R TKR in 04/2015.  He has steroid inj in L knee and L shoulder.  Also uses CBD oil liniment and this helps. She has OSA.  ROS: Constitutional: + weight gain/no weight loss, no fatigue, no subjective hyperthermia, no subjective hypothermia Eyes: no blurry vision, no xerophthalmia ENT: no sore throat, no nodules palpated in neck, no dysphagia, no odynophagia, no hoarseness Cardiovascular: no CP/no SOB/no palpitations/+ leg swelling bilaterally Respiratory: no cough/no SOB/no wheezing Gastrointestinal: no N/no V/no D/no C/no acid reflux Musculoskeletal: no muscle aches/no joint  aches Skin: no rashes, no hair loss Neurological: no tremors/+ numbness/+ tingling/no dizziness  I reviewed pt's medications, allergies, PMH, social hx, family hx, and changes were documented in the history of present illness. Otherwise, unchanged from my initial visit note.   Past Medical History:  Diagnosis Date  . Chronic back pain 09/13/2013  . Chronic pain syndrome 09/13/2013  . GERD (gastroesophageal reflux disease)    hx. of  . Gout 09/13/2013  . Hemorrhoids   . Hypertension   . Morbid obesity with body mass index of 45.0-49.9 in adult [facility](HCC) 09/13/2013  . Osteoarthritis 09/13/2013  . Pneumonia    hx. of  . Seizures (HCC)   . Sleep apnea    has never used C-pap machine due to insurance cost  . Type 1 diabetes mellitus, uncontrolled (HCC)    Past Surgical History:  Procedure Laterality Date  . broken toe 35 years ago    . TOTAL KNEE ARTHROPLASTY Right 05/01/2015   Procedure: RIGHT TOTAL KNEE ARTHROPLASTY;  Surgeon: Kathryne Hitchhristopher Y Blackman, MD;  Location: [facility];  Service: Orthopedics;  Laterality: Right;  . WISDOM TOOTH EXTRACTION     Social History   Socioeconomic History  . Marital status: Married    Spouse name: Not on file  . Number of children: Not on file  . Years of education: Not on file  . Highest education level: Not on file  Occupational History  . Not on file  Tobacco Use  . Smoking status: Former Smoker    Quit date: 03/08/2004    Years since quitting: 16.5  . Smokeless tobacco: Never Used  Substance and Sexual Activity  . Alcohol use: No    Comment: rare  . Drug use: No  . Sexual activity: Yes    Partners: Female  Other Topics Concern  . Not on file  Social History Narrative   Psychiatric nurseT Manager for [employer]   Married   Social Determinants of Health   Financial Resource Strain:   . Difficulty of Paying Living Expenses: Not on file  Food Insecurity:   . Worried About Programme researcher, broadcasting/film/videounning Out of Food in the Last Year: Not on file  . Ran Out of Food in the Last  Year: Not on file  Transportation Needs:   . Lack of Transportation (Medical): Not on file  . Lack of Transportation (Non-Medical): Not on file  Physical Activity:   . Days of Exercise per Week: Not on file  . Minutes of Exercise per Session: Not on file  Stress:   . Feeling of Stress : Not on file  Social Connections:   . Frequency of Communication with Friends and Family: Not on file  . Frequency of Social Gatherings with Friends and Family: Not on file  . Attends Religious Services: Not on file  .  Active Member of Clubs or Organizations: Not on file  . Attends Banker Meetings: Not on file  . Marital Status: Not on file  Intimate Partner Violence:   . Fear of Current or Ex-Partner: Not on file  . Emotionally Abused: Not on file  . Physically Abused: Not on file  . Sexually Abused: Not on file   Current Outpatient Medications on File Prior to Visit  Medication Sig Dispense Refill  . aspirin 81 MG tablet Take 81 mg by mouth once.    . bisoprolol-hydrochlorothiazide (ZIAC) 10-6.25 MG tablet TAKE 1 TABLET BY MOUTH  DAILY 90 tablet 3  . diclofenac (VOLTAREN) 75 MG EC tablet TAKE 1 TABLET BY MOUTH  TWICE DAILY 180 tablet 3  . glipiZIDE (GLUCOTROL) 10 MG tablet TAKE 1 TABLET BY MOUTH  TWICE DAILY BEFORE MEALS 180 tablet 3  . HUMALOG KWIKPEN 200 UNIT/ML KwikPen INJECT 25 TO 35 UNITS INTO THE SKIN 3 TIMES DAILY. 48 pen 4  . HYDROcodone-acetaminophen (NORCO/VICODIN) 5-325 MG tablet Take 1-2 tablets by mouth every 6 (six) hours as needed for moderate pain. 40 tablet 0  . Insulin Glargine (LANTUS SOLOSTAR) 100 UNIT/ML Solostar Pen INJECT 20 UNITS IN AM AND 65 UNITS AT NIGHT UNDER SKIN 15 mL 3  . Insulin Pen Needle (NOVOTWIST) 32G X 5 MM MISC USE AS DIRECTED 5 TIMES A DAY WITH INSULIN INJECTIONS E11.65 500 each 5  . metFORMIN (GLUCOPHAGE) 1000 MG tablet TAKE 1 TABLET BY MOUTH  TWICE DAILY WITH A MEAL 180 tablet 3  . montelukast (SINGULAIR) 10 MG tablet Take 1 tablet (10 mg total)  by mouth at bedtime as needed. 30 tablet 6  . oxyCODONE-acetaminophen (PERCOCET/ROXICET) 5-325 MG tablet Take 1 tablet by mouth 2 (two) times daily as needed for severe pain. 20 tablet 0  . TRULICITY 3 MG/0.5ML SOPN INJECT 3 MG INTO THE SKIN ONCE A WEEK. 3 mL 0  . [DISCONTINUED] diclofenac (VOLTAREN) 75 MG EC tablet TAKE 1 TABLET BY MOUTH TWO  TIMES DAILY 180 tablet 3   No current facility-administered medications on file prior to visit.   Allergies  Allergen Reactions  . Penicillins     REACTION: swelling, rash   Family History  Problem Relation Age of Onset  . Alcohol abuse Father   . Arthritis Maternal Grandmother   . Alcohol abuse Paternal Grandmother   . Alcohol abuse Paternal Grandfather   . Diabetes Maternal Aunt     PE: BP 140/84   Pulse 74   Ht 5' 6.5" (1.689 m)   Wt (!) 352 lb 3.2 oz (159.8 kg)   SpO2 95%   BMI 55.99 kg/m  Body mass index is 55.99 kg/m.   Wt Readings from Last 3 Encounters:  09/08/20 (!) 352 lb 3.2 oz (159.8 kg)  05/14/20 (!) 346 lb (156.9 kg)  01/09/20 (!) 355 lb (161 kg)   Constitutional: overweight, in NAD Eyes: PERRLA, EOMI, no exophthalmos ENT: moist mucous membranes, no thyromegaly, no cervical lymphadenopathy Cardiovascular: RRR, No MRG, + LE periankle edema L>R Respiratory: CTA B Gastrointestinal: abdomen soft, NT, ND, BS+ Musculoskeletal: no deformities, strength intact in all 4 Skin: moist, warm,  + stasis dermatitis rash B lower legs Neurological: no tremor with outstretched hands, DTR normal in all 4  ASSESSMENT: 1. DM2, insulin-dependent, uncontrolled, with complications - PN - stable, worse in L foot - DR  2. PN  3.  Obesity class III  PLAN:  1. Patient with history of uncontrolled diabetes, on a complex  medication regimen including oral medications, Metformin, sulfonylurea, also weekly GLP-1 receptor agonist and basal/bolus insulin regimen, with improved control at last visit-HbA1c 7.0%.  At that time, he was keeping an  eye on his diet and eating more salads.  He was able to lose some weight.  He was only checking sugars in the morning and I advised him to start checking before meals and at bedtime, rotating check times.  We did not change his regimen at that time. -At today's visit, sugars are higher at all times of the day as he was more stressed, ate more inconsistently, and missed many insulin doses.  No lows.  We discussed about the importance of taking the medications consistently and also eating regular meals.  He would like to return to his previous diet.  For now, we will not make any medication changes. - I advised him to: Patient Instructions  Please continue: - Metformin 2000 mg daily with dinner  - Glipizide 10 mg 2x a day  - Lantus 20 units in a.m. and 65 units at bedtime - Humalog 30-40 units 3x a day before meals - Trulicity 3 mg weekly  Please come back for a follow-up appointment in 4 months.  - we checked his HbA1c: 7.8% (higher) - advised to check sugars at different times of the day - 3x a day, rotating check times - advised for yearly eye exams >> he is not UTD -advised to schedule another one - he is due for annual labs, but he would want to have them checked at PCPs office insurance did not pay yet for the ones that we checked here last year - return to clinic in 4 months   2. HL -Reviewed latest lipid panel from 09/2019: LDL above target, triglycerides at goal, HDL slightly low: Lab Results  Component Value Date   CHOL 149 09/09/2019   HDL 33.50 (L) 09/09/2019   LDLCALC 85 09/09/2019   TRIG 150.0 (H) 09/09/2019   CHOLHDL 4 09/09/2019  -He is not tolerating statins due to leg cramps -He is due for another lipid panel -at next visit with PCP  3.  Obesity class III -She saw nutrition on 10/25/2019. -We will continue Trulicity which should also help with weight loss -Before last visit, he lost 9 pounds - gained 6 lbs since last OV >>  He definitely needs a change in  diet  Carlus Pavlov, MD PhD [facility] Endocr[facility]

## 2020-09-08 NOTE — Addendum Note (Signed)
Addended by: Darene Lamer T on: 09/08/2020 01:20 PM   Modules accepted: Orders

## 2020-09-16 ENCOUNTER — Ambulatory Visit: Payer: Medicare Other | Admitting: Internal Medicine

## 2020-09-20 ENCOUNTER — Other Ambulatory Visit: Payer: Self-pay | Admitting: Internal Medicine

## 2020-10-06 DIAGNOSIS — K429 Umbilical hernia without obstruction or gangrene: Secondary | ICD-10-CM | POA: Diagnosis not present

## 2020-10-26 ENCOUNTER — Other Ambulatory Visit: Payer: Self-pay | Admitting: Internal Medicine

## 2020-10-28 ENCOUNTER — Other Ambulatory Visit: Payer: Self-pay | Admitting: Internal Medicine

## 2020-10-28 DIAGNOSIS — Z23 Encounter for immunization: Secondary | ICD-10-CM | POA: Diagnosis not present

## 2020-11-13 ENCOUNTER — Other Ambulatory Visit: Payer: Self-pay | Admitting: Surgery

## 2020-11-13 DIAGNOSIS — K429 Umbilical hernia without obstruction or gangrene: Secondary | ICD-10-CM | POA: Diagnosis not present

## 2020-11-24 ENCOUNTER — Encounter: Payer: Self-pay | Admitting: Family Medicine

## 2020-11-24 MED ORDER — BISOPROLOL FUMARATE 5 MG PO TABS: ORAL_TABLET | ORAL | 0 refills | Status: DC

## 2020-11-24 MED ORDER — HYDROCHLOROTHIAZIDE 12.5 MG PO TABS: ORAL_TABLET | ORAL | 6 refills | Status: DC

## 2020-12-09 DIAGNOSIS — E119 Type 2 diabetes mellitus without complications: Secondary | ICD-10-CM | POA: Diagnosis not present

## 2020-12-17 ENCOUNTER — Other Ambulatory Visit: Payer: Self-pay | Admitting: Internal Medicine

## 2020-12-17 DIAGNOSIS — IMO0002 Reserved for concepts with insufficient information to code with codable children: Secondary | ICD-10-CM

## 2020-12-17 DIAGNOSIS — E1165 Type 2 diabetes mellitus with hyperglycemia: Secondary | ICD-10-CM

## 2020-12-22 ENCOUNTER — Other Ambulatory Visit: Payer: Self-pay | Admitting: Family Medicine

## 2020-12-22 MED ORDER — BISOPROLOL FUMARATE 5 MG PO TABS: ORAL_TABLET | ORAL | 0 refills | Status: DC

## 2021-01-12 ENCOUNTER — Ambulatory Visit (INDEPENDENT_AMBULATORY_CARE_PROVIDER_SITE_OTHER): Payer: PRIVATE HEALTH INSURANCE | Admitting: Internal Medicine

## 2021-01-12 ENCOUNTER — Encounter: Payer: Self-pay | Admitting: Internal Medicine

## 2021-01-12 ENCOUNTER — Other Ambulatory Visit: Payer: Self-pay

## 2021-01-12 VITALS — BP 140/88 | HR 76 | Ht 66.5 in | Wt 353.0 lb

## 2021-01-12 DIAGNOSIS — E785 Hyperlipidemia, unspecified: Secondary | ICD-10-CM | POA: Diagnosis not present

## 2021-01-12 DIAGNOSIS — E1165 Type 2 diabetes mellitus with hyperglycemia: Secondary | ICD-10-CM

## 2021-01-12 DIAGNOSIS — IMO0002 Reserved for concepts with insufficient information to code with codable children: Secondary | ICD-10-CM

## 2021-01-12 DIAGNOSIS — E1169 Type 2 diabetes mellitus with other specified complication: Secondary | ICD-10-CM

## 2021-01-12 DIAGNOSIS — E1142 Type 2 diabetes mellitus with diabetic polyneuropathy: Secondary | ICD-10-CM

## 2021-01-12 DIAGNOSIS — Z6841 Body Mass Index (BMI) 40.0 and over, adult: Secondary | ICD-10-CM

## 2021-01-12 LAB — POCT GLYCOSYLATED HEMOGLOBIN (HGB A1C): Hemoglobin A1C: 7.5 % — AB (ref 4.0–5.6)

## 2021-01-12 NOTE — Patient Instructions (Signed)
Please continue: - Metformin 2000 mg daily with dinner  - Glipizide 10 mg 2x a day  - Lantus 20 units in a.m. and 65 units at bedtime - Humalog 30-40 units 3x a day before meals - Trulicity 3 mg weekly  Please come back for a follow-up appointment in 4 months.

## 2021-01-12 NOTE — Progress Notes (Signed)
Patient ID: Christian [name], male   DOB: 1950-12-06, 70 y.o.   MRN: [id number]  This visit occurred during the SARS-CoV-2 public health emergency.  Safety protocols were in place, including screening questions prior to the visit, additional usage of staff PPE, and extensive cleaning of exam room while observing appropriate contact time as indicated for disinfecting solutions.   HPI Christian [name] is a 70 y.o.-year-old male, returning for f/u for DM2, dx 2009, insulin-dependent since 2014, uncontrolled, with complications (DR, Peripheral neuropathy). Last visit: 4 months ago. [name] + [employer].  His wife will retire soon so he may not have access to the medications that we are using now.  He is applying for VA benefits.  He was taken off Bisoprolol mid 12/2020 >> feels sugars improved after this.  Reviewed HbA1c levels: Lab Results  Component Value Date   HGBA1C 7.8 (A) 09/08/2020   HGBA1C 7.0 (A) 05/14/2020   HGBA1C 7.5 (A) 01/09/2020   HGBA1C 6.9 (A) 09/09/2019   HGBA1C 7.6 (A) 05/08/2019   HGBA1C 8.2 (A) 01/08/2019   HGBA1C 7.7 (A) 09/25/2018   HGBA1C 7.4 (A) 05/25/2018   HGBA1C 9.0 02/22/2018   HGBA1C 9.1 11/23/2017   HGBA1C 9 08/24/2017   HGBA1C 8.4 05/25/2017   HGBA1C 8.8 02/06/2017   HGBA1C 8.8 11/08/2016   HGBA1C 7.3 08/03/2016   HGBA1C 7.7 05/03/2016   HGBA1C 7.6 01/27/2016   HGBA1C 7.4 10/29/2015   HGBA1C 6.8 07/30/2015   HGBA1C 7.4 (H) 02/16/2015  08/24/2017: HbA1c 9%  Pt is on: - Metformin 1000 mg 2x a daily >> 2000 mg with dinner - Glipizide 10 mg 2x a day  - Trulicity 1.5 >> 3 mg weekly - Lantus 20 units in a.m. and 65 units at bedtime - Humalog 30-35 >> 30-40 units 3x a day before meals >> 35-35-40 units before meals Stopped Jardiance 25 mg >> but too expensive - in the donut hole; had yeast infections. Glipizide and Invokana were started 03/2014.  He has been on Victoza in the past >> not efficient anymore.  Pt checks his sugars 0 to once a day: - am:  108, 144-221, 266 >> 80, 107-150, 222 (pizza), 230  - 2h after b'fast: n/c >> 157 >> n/c - before lunch: 280 >> 125 >> 190-200 >> n/c - 2h after lunch: 230-235 >> 220-230 >> n/c >> 190-220 >> n/c - before dinner: 180s >> n/c >> 200s >> n/c  - 2h after dinner: n/c  - bedtime:   165-180 >> n/c >> 230, 281 >> 180s, 333 - nighttime: n/c  Lowest sugar was 85 >> 80, he has hypoglycemia awareness in the 90s Highest sugar was 300s (steroid inj's) >> 280s >> 333.  Meter: OneTouch Ultra Mini >> [name]  -No CKD, last BUN/creatinine:  Lab Results  Component Value Date   BUN 21 09/09/2019   CREATININE 0.93 09/09/2019  Not on ACE inhibitor/ARB.  Normal ACR: Lab Results  Component Value Date   MICRALBCREAT 0.7 09/09/2019   MICRALBCREAT 1.1 08/31/2017   MICRALBCREAT 0.8 08/20/2015   MICRALBCREAT 0.3 09/11/2013   Normal GFR levels: Lab Results  Component Value Date   GFRNONAA 84 09/09/2019   GFRNONAA >60 05/02/2015   GFRNONAA >60 04/27/2015   -+ HL; last set of lipids: Lab Results  Component Value Date   CHOL 149 09/09/2019   HDL 33.50 (L) 09/09/2019   LDLCALC 85 09/09/2019   TRIG 150.0 (H) 09/09/2019   CHOLHDL 4 09/09/2019  No statins due to leg  cramps - last eye exam was in 2022: Reportedly + DR -+ numbness and tingling in his feet  He had R TKR in 04/2015.  He has steroid inj in L knee and L shoulder.  Also uses CBD oil liniment and this helps. She has OSA.  ROS: Constitutional: no weight gain/no weight loss, no fatigue, no subjective hyperthermia, no subjective hypothermia Eyes: no blurry vision, no xerophthalmia ENT: no sore throat, no nodules palpated in neck, no dysphagia, no odynophagia, no hoarseness Cardiovascular: no CP/no SOB/no palpitations/no leg swelling Respiratory: no cough/no SOB/no wheezing Gastrointestinal: no N/no V/no D/no C/no acid reflux Musculoskeletal: no muscle aches/no joint aches Skin: no rashes, no hair loss Neurological: no tremors/+ numbness/+  tingling/no dizziness  I reviewed pt's medications, allergies, PMH, social hx, family hx, and changes were documented in the history of present illness. Otherwise, unchanged from my initial visit note.   Past Medical History:  Diagnosis Date  . Chronic back pain 09/13/2013  . Chronic pain syndrome 09/13/2013  . GERD (gastroesophageal reflux disease)    hx. of  . Gout 09/13/2013  . Hemorrhoids   . Hypertension   . Morbid obesity with body mass index of 45.0-49.9 in adult [facility]) 09/13/2013  . Osteoarthritis 09/13/2013  . Pneumonia    hx. of  . Seizures (HCC)   . Sleep apnea    has never used C-pap machine due to insurance cost  . Type 1 diabetes mellitus, uncontrolled (HCC)    Past Surgical History:  Procedure Laterality Date  . broken toe 35 years ago    . TOTAL KNEE ARTHROPLASTY Right 05/01/2015   Procedure: RIGHT TOTAL KNEE ARTHROPLASTY;  Surgeon: Kathryne Hitch, MD;  Location: [facility];  Service: Orthopedics;  Laterality: Right;  . WISDOM TOOTH EXTRACTION     Social History   Socioeconomic History  . Marital status: Married    Spouse name: Not on file  . Number of children: Not on file  . Years of education: Not on file  . Highest education level: Not on file  Occupational History  . Not on file  Tobacco Use  . Smoking status: Former Smoker    Quit date: 03/08/2004    Years since quitting: 16.8  . Smokeless tobacco: Never Used  Substance and Sexual Activity  . Alcohol use: No    Comment: rare  . Drug use: No  . Sexual activity: Yes    Partners: Female  Other Topics Concern  . 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 aspirin 81 MG tablet Take 81 mg by mouth once.    . bisoprolol (ZEBETA) 5 MG tablet Take 1 PO qd x 1 week, then 1/2 PO qd x 1 week, then 1/2 PO qod x 1 week, then stop 30 tablet 0  . bisoprolol-hydrochlorothiazide (ZIAC) 10-6.25 MG tablet TAKE 1 TABLET BY MOUTH  DAILY 90 tablet 3  . diclofenac (VOLTAREN) 75 MG EC tablet TAKE 1 TABLET BY MOUTH  TWICE DAILY 180 tablet 3  . glipiZIDE (GLUCOTROL) 10 MG tablet TAKE 1 TABLET BY  MOUTH  TWICE DAILY BEFORE MEALS 180 tablet 3  . HUMALOG KWIKPEN 200 UNIT/ML KwikPen INJECT 25 TO 35 UNITS INTO THE SKIN 3 TIMES DAILY. 48 pen 4  . hydrochlorothiazide (HYDRODIURIL) 12.5 MG tablet 0.5-1 PO qd prn edema 30 tablet 6  . HYDROcodone-acetaminophen (NORCO/VICODIN) 5-325 MG tablet Take 1-2 tablets by mouth every 6 (six) hours as needed for moderate pain. 40 tablet 0  . Insulin Pen Needle (NOVOTWIST) 32G X 5 MM MISC USE AS DIRECTED 5 TIMES A DAY WITH INSULIN INJECTIONS E11.65 500 each 5  . LANTUS SOLOSTAR 100 UNIT/ML Solostar Pen INJECT 20 UNITS IN AM AND 65 UNITS AT NIGHT UNDER SKIN 15 mL 3  . metFORMIN (GLUCOPHAGE) 1000 MG tablet TAKE 1 TABLET BY MOUTH  TWICE DAILY WITH A MEAL 180 tablet 3  . montelukast (SINGULAIR) 10 MG tablet Take 1 tablet (10 mg total) by mouth at bedtime as needed. 30 tablet 6  . oxyCODONE-acetaminophen (PERCOCET/ROXICET) 5-325 MG tablet Take 1 tablet by mouth 2 (two) times daily as needed for severe pain. 20 tablet 0  . TRULICITY 3 MG/0.5ML SOPN INJECT 3 MG INTO THE SKIN ONCE A WEEK. 6 mL 0   No current facility-administered medications on file prior to visit.   Allergies  Allergen Reactions  . Penicillins     REACTION: swelling, rash   Family History  Problem Relation Age of Onset  . Alcohol abuse Father   . Arthritis Maternal Grandmother   . Alcohol abuse Paternal Grandmother   . Alcohol abuse Paternal Grandfather   . Diabetes Maternal Aunt     PE: BP 140/88 (BP Location:  Right Arm, Patient Position: Sitting, Cuff Size: Large)   Pulse 76   Ht 5' 6.5" (1.689 m)   Wt (!) 353 lb (160.1 kg)   SpO2 95%   BMI 56.12 kg/m  Body mass index is 56.12 kg/m.   Wt Readings from Last 3 Encounters:  01/12/21 (!) 353 lb (160.1 kg)  09/08/20 (!) 352 lb 3.2 oz (159.8 kg)  05/14/20 (!) 346 lb (156.9 kg)   Constitutional: overweight, in NAD Eyes: PERRLA, EOMI, no exophthalmos ENT: moist mucous membranes, no thyromegaly, no cervical lymphadenopathy Cardiovascular: RRR, No MRG, + LE periankle edema L>R Respiratory: CTA B Gastrointestinal: abdomen soft, NT, ND, BS+ Musculoskeletal: no deformities, strength intact in all 4 Skin: moist, warm, + stasis dermatitis rash bilateral lower legs Neurological: no tremor with outstretched hands, DTR normal in all 4  ASSESSMENT: 1. DM2, insulin-dependent, uncontrolled, with complications - PN - stable, worse in L foot - DR  2. PN  3.  Obesity class III  PLAN:  1. Patient with history of uncontrolled type 2 diabetes, on a complex medication regimen including oral medications (Metformin, sulfonylurea), weekly GLP-1 receptor agonist and basal-bolus insulin, with still poor control.  At last visit, HbA1c was higher, at 7.8%, as he was eating more inconsistently and missed many medication doses.  Sugars are higher at all times of the day.  We discussed about improving diet but we did not make any changes in his regimen. -At today's visit, sugars have improved and he feels that he is related to stopping bisoprolol approximately 3 weeks ago.  He continues to have hyperglycemic spikes after dietary indiscretions.  Overall, there is improvement, and no low blood sugars, so for now, we will continue the same regimen.  We again discussed about the importance of working on his diet. - I advised him to: Patient Instructions  Please continue: - Metformin  2000 mg daily with dinner  - Glipizide 10 mg 2x a day  - Lantus 20 units in a.m. and 65  units at bedtime - Humalog 30-40 units 3x a day before meals - Trulicity 3 mg weekly  Please continue to work on [employer] diet.   Please come back for a follow-up appointment in 4 months.  - we checked his HbA1c: 7.5% (slightly lower) - advised to check sugars at different times of the day - 4x a day, rotating check times - advised for yearly eye exams >> he is UTD - he needs annual labs -he prefers to get them checked at PCPs office due to insurance coverage - return to clinic in 4 months  2. HL -Reviewed latest lipid panel from 09/2019: LDL above target, triglycerides at goal, HDL slightly low: Lab Results  Component Value Date   CHOL 149 09/09/2019   HDL 33.50 (L) 09/09/2019   LDLCALC 85 09/09/2019   TRIG 150.0 (H) 09/09/2019   CHOLHDL 4 09/09/2019  -He did not tolerate statins due to leg cramps -He is due for another lipid panel -he would want this to be checked at PCPs office due to insurance coverage.  3.  Obesity class III -She saw nutrition in 10/2019 -We will continue Trulicity which should also help with weight loss -He gained 6 pounds before last visit and 1 pound since last visit  Carlus Pavlovristina Shivansh Hardaway, MD PhD [facility]

## 2021-01-16 ENCOUNTER — Other Ambulatory Visit: Payer: Self-pay | Admitting: Internal Medicine

## 2021-02-27 ENCOUNTER — Other Ambulatory Visit: Payer: Self-pay | Admitting: Family Medicine

## 2021-03-08 ENCOUNTER — Other Ambulatory Visit: Payer: Self-pay | Admitting: Internal Medicine

## 2021-03-08 DIAGNOSIS — E1165 Type 2 diabetes mellitus with hyperglycemia: Secondary | ICD-10-CM

## 2021-03-08 DIAGNOSIS — IMO0002 Reserved for concepts with insufficient information to code with codable children: Secondary | ICD-10-CM

## 2021-03-20 ENCOUNTER — Other Ambulatory Visit: Payer: Self-pay | Admitting: Internal Medicine

## 2021-04-04 ENCOUNTER — Other Ambulatory Visit: Payer: Self-pay | Admitting: Internal Medicine

## 2021-05-14 ENCOUNTER — Encounter: Payer: Self-pay | Admitting: Internal Medicine

## 2021-05-14 ENCOUNTER — Ambulatory Visit (INDEPENDENT_AMBULATORY_CARE_PROVIDER_SITE_OTHER): Payer: No Typology Code available for payment source | Admitting: Internal Medicine

## 2021-05-14 ENCOUNTER — Other Ambulatory Visit: Payer: Self-pay

## 2021-05-14 VITALS — BP 120/82 | HR 105 | Ht 66.5 in | Wt 338.8 lb

## 2021-05-14 DIAGNOSIS — E1169 Type 2 diabetes mellitus with other specified complication: Secondary | ICD-10-CM

## 2021-05-14 DIAGNOSIS — Z6841 Body Mass Index (BMI) 40.0 and over, adult: Secondary | ICD-10-CM | POA: Diagnosis not present

## 2021-05-14 DIAGNOSIS — E1165 Type 2 diabetes mellitus with hyperglycemia: Secondary | ICD-10-CM | POA: Diagnosis not present

## 2021-05-14 DIAGNOSIS — E785 Hyperlipidemia, unspecified: Secondary | ICD-10-CM

## 2021-05-14 DIAGNOSIS — E1142 Type 2 diabetes mellitus with diabetic polyneuropathy: Secondary | ICD-10-CM | POA: Diagnosis not present

## 2021-05-14 DIAGNOSIS — IMO0002 Reserved for concepts with insufficient information to code with codable children: Secondary | ICD-10-CM

## 2021-05-14 LAB — COMPREHENSIVE METABOLIC PANEL
ALT: 30 U/L (ref 0–53)
AST: 25 U/L (ref 0–37)
Albumin: 4.2 g/dL (ref 3.5–5.2)
Alkaline Phosphatase: 73 U/L (ref 39–117)
BUN: 27 mg/dL — ABNORMAL HIGH (ref 6–23)
CO2: 27 mEq/L (ref 19–32)
Calcium: 9.9 mg/dL (ref 8.4–10.5)
Chloride: 105 mEq/L (ref 96–112)
Creatinine, Ser: 1.25 mg/dL (ref 0.40–1.50)
GFR: 58.31 mL/min — ABNORMAL LOW (ref >60.00)
Glucose, Bld: 92 mg/dL (ref 70–99)
Potassium: 4.6 mEq/L (ref 3.5–5.1)
Sodium: 141 mEq/L (ref 135–145)
Total Bilirubin: 0.5 mg/dL (ref 0.2–1.2)
Total Protein: 7.9 g/dL (ref 6.0–8.3)

## 2021-05-14 LAB — POCT GLYCOSYLATED HEMOGLOBIN (HGB A1C): Hemoglobin A1C: 7.7 % — AB (ref 4.0–5.6)

## 2021-05-14 LAB — LIPID PANEL
Cholesterol: 142 mg/dL (ref 0–200)
HDL: 32.3 mg/dL — ABNORMAL LOW (ref >39.00)
LDL Cholesterol: 84 mg/dL (ref 0–99)
NonHDL: 110.07
Total CHOL/HDL Ratio: 4
Triglycerides: 131 mg/dL (ref 0.0–149.0)
VLDL: 26.2 mg/dL (ref 0.0–40.0)

## 2021-05-14 LAB — MICROALBUMIN / CREATININE URINE RATIO
Creatinine,U: 195.4 mg/dL
Microalb Creat Ratio: 1.1 mg/g (ref 0.0–30.0)
Microalb, Ur: 2.1 mg/dL — ABNORMAL HIGH (ref 0.0–1.9)

## 2021-05-14 NOTE — Progress Notes (Signed)
Patient ID: Christian [name], male   DOB: [birth year], 70 y.o.   MRN: [id number]  This visit occurred during the SARS-CoV-2 public health emergency.  Safety protocols were in place, including screening questions prior to the visit, additional usage of staff PPE, and extensive cleaning of exam room while observing appropriate contact time as indicated for disinfecting solutions.   HPI Christian [name] is a 70 y.o.-year-old male, returning for f/u for DM2, dx 2009, insulin-dependent since 2014, uncontrolled, with complications (DR, Peripheral neuropathy). Last visit: 4 months ago. [name] [employer].  His wife will retire soon so he may not have access to the medications that we are using now.  He is applying for VA benefits. No PCP now after Dr. Prince Rome left.  Interim history: No increased urination, blurry vision, nausea, chest pain. He has imbalance - had 2 falls. He has a cane.  Reviewed HbA1c levels: Lab Results  Component Value Date   HGBA1C 7.5 (A) 01/12/2021   HGBA1C 7.8 (A) 09/08/2020   HGBA1C 7.0 (A) 05/14/2020   HGBA1C 7.5 (A) 01/09/2020   HGBA1C 6.9 (A) 09/09/2019   HGBA1C 7.6 (A) 05/08/2019   HGBA1C 8.2 (A) 01/08/2019   HGBA1C 7.7 (A) 09/25/2018   HGBA1C 7.4 (A) 05/25/2018   HGBA1C 9.0 02/22/2018   HGBA1C 9.1 11/23/2017   HGBA1C 9 08/24/2017   HGBA1C 8.4 05/25/2017   HGBA1C 8.8 02/06/2017   HGBA1C 8.8 11/08/2016   HGBA1C 7.3 08/03/2016   HGBA1C 7.7 05/03/2016   HGBA1C 7.6 01/27/2016   HGBA1C 7.4 10/29/2015   HGBA1C 6.8 07/30/2015  08/24/2017: HbA1c 9%  Pt is on: - Metformin 1000 mg 2x a daily >> 2000 mg with dinner - Glipizide 10 mg 2x a day  - Trulicity 1.5 >> 3 mg weekly - Lantus 20 units in a.m. and 65 units at bedtime - Humalog 30-35 >> 30-40 units 3x a day before meals >> 35(-35)-40 units before meals Stopped Jardiance 25 mg >> but too expensive - in the donut hole; had yeast infections. Glipizide and Invokana were started 03/2014.  He has been on Victoza in  the past >> not efficient anymore.  Pt checks his sugars 0 to once a day: - am: 108, 144-221, 266 >> 80, 107-150, 222 (pizza), 230 >> 84-210, 226, 280 - 2h after b'fast: n/c >> 157 >> n/c - before lunch: 280 >> 125 >> 190-200 >> n/c - 2h after lunch: 230-235 >> 220-230 >> n/c >> 190-220 >> n/c - before dinner: 180s >> n/c >> 200s >> n/c  - 2h after dinner: n/c  - bedtime:   165-180 >> n/c >> 230, 281 >> 180s, 333 >> n/c - nighttime: n/c  Lowest sugar was 85 >> 80 >> 83, he has hypoglycemia awareness in the 90s Highest sugar was 300s (steroid inj's) >> 280s >> 333 >> 280.  Meter: OneTouch Ultra Mini >> [name]  -No CKD, last BUN/creatinine:  Lab Results  Component Value Date   BUN 21 09/09/2019   CREATININE 0.93 09/09/2019  Not on ACE inhibitor/ARB.  Normal ACR: Lab Results  Component Value Date   MICRALBCREAT 0.7 09/09/2019   MICRALBCREAT 1.1 08/31/2017   MICRALBCREAT 0.8 08/20/2015   MICRALBCREAT 0.3 09/11/2013   Normal GFR levels: Lab Results  Component Value Date   GFRNONAA 84 09/09/2019   GFRNONAA >60 05/02/2015   GFRNONAA >60 04/27/2015   -+ HL; last set of lipids: Lab Results  Component Value Date   CHOL 149 09/09/2019   HDL 33.50 (  L) 09/09/2019   LDLCALC 85 09/09/2019   TRIG 150.0 (H) 09/09/2019   CHOLHDL 4 09/09/2019  No statins due to leg cramps.  - last eye exam was in 2022: Reportedly + DR  -+ numbness and tingling in his feet  He had R TKR in 04/2015.  He has steroid inj in L knee and L shoulder.  Also uses CBD oil liniment and this helps. She has OSA.  ROS: + joint aches  I reviewed pt's medications, allergies, PMH, social hx, family hx, and changes were documented in the history of present illness. Otherwise, unchanged from my initial visit note.   Past Medical History:  Diagnosis Date   Chronic back pain 09/13/2013   Chronic pain syndrome 09/13/2013   GERD (gastroesophageal reflux disease)    hx. of   Gout 09/13/2013   Hemorrhoids     Hypertension    Morbid obesity with body mass index of 45.0-49.9 in adult [facility](HCC) 09/13/2013   Osteoarthritis 09/13/2013   Pneumonia    hx. of   Seizures (HCC)    Sleep apnea    has never used C-pap machine due to insurance cost   Type 1 diabetes mellitus, uncontrolled (HCC)    Past Surgical History:  Procedure Laterality Date   broken toe 35 years ago     TOTAL KNEE ARTHROPLASTY Right 05/01/2015   Procedure: RIGHT TOTAL KNEE ARTHROPLASTY;  Surgeon: Kathryne Hitchhristopher Y Blackman, MD;  Location: [facility];  Service: Orthopedics;  Laterality: Right;   WISDOM TOOTH EXTRACTION     Social History   Socioeconomic History   Marital status: Married    Spouse name: Not on file   Number of children: Not on file   Years of education: Not on file   Highest education level: Not on file  Occupational History   Not on file  Tobacco Use   Smoking status: Former    Pack years: 0.00    Types: Cigarettes    Quit date: 03/08/2004    Years since quitting: 17.1   Smokeless tobacco: Never  Substance and Sexual Activity   Alcohol use: No    Comment: rare   Drug use: No   Sexual activity: Yes    Partners: Female  Other Topics Concern   Not on file  Social History Narrative   Psychiatric nurseT Manager [employer]   Married   Social Determinants of Health   Financial Resource Strain: Not on file  Food Insecurity: Not on file  Transportation Needs: Not on file  Physical Activity: Not on file  Stress: Not on file  Social Connections: Not on file  Intimate Partner Violence: Not on file   Current Outpatient Medications on File Prior to Visit  Medication Sig Dispense Refill   aspirin 81 MG tablet Take 81 mg by mouth once.     bisoprolol (ZEBETA) 5 MG tablet Take 1 PO qd x 1 week, then 1/2 PO qd x 1 week, then 1/2 PO qod x 1 week, then stop 30 tablet 0   diclofenac (VOLTAREN) 75 MG EC tablet TAKE 1 TABLET BY MOUTH  TWICE DAILY 180 tablet 3   glipiZIDE (GLUCOTROL) 10 MG tablet TAKE 1 TABLET BY MOUTH  TWICE DAILY  BEFORE MEALS 180 tablet 3   HUMALOG KWIKPEN 200 UNIT/ML KwikPen INJECT 25 TO 35 UNITS INTO THE SKIN 3 TIMES DAILY. 48 pen 4   hydrochlorothiazide (HYDRODIURIL) 12.5 MG tablet 0.5-1 PO qd prn edema 30 tablet 6   HYDROcodone-acetaminophen (NORCO/VICODIN) 5-325 MG tablet  Take 1-2 tablets by mouth every 6 (six) hours as needed for moderate pain. 40 tablet 0   insulin glargine (LANTUS SOLOSTAR) 100 UNIT/ML Solostar Pen INJECT 20 UNITS IN MORNING AND 65 UNITS AT NIGHT UNDER SKIN 60 mL 1   Insulin Pen Needle (NOVOTWIST) 32G X 5 MM MISC USE AS DIRECTED 5 TIMES A DAY WITH INSULIN INJECTIONS E11.65 500 each 5   metFORMIN (GLUCOPHAGE) 1000 MG tablet TAKE 1 TABLET BY MOUTH  TWICE DAILY WITH A MEAL 180 tablet 3   montelukast (SINGULAIR) 10 MG tablet TAKE 1 TABLET BY MOUTH AT BEDTIME AS NEEDED. 90 tablet 2   oxyCODONE-acetaminophen (PERCOCET/ROXICET) 5-325 MG tablet Take 1 tablet by mouth 2 (two) times daily as needed for severe pain. 20 tablet 0   TRULICITY 3 MG/0.5ML SOPN INJECT 3 MG INTO THE SKIN ONCE A WEEK. 2 mL 2   No current facility-administered medications on file prior to visit.   Allergies  Allergen Reactions   Penicillins     REACTION: swelling, rash   Family History  Problem Relation Age of Onset   Alcohol abuse Father    Arthritis Maternal Grandmother    Alcohol abuse Paternal Grandmother    Alcohol abuse Paternal Grandfather    Diabetes Maternal Aunt     PE: BP 120/82 (BP Location: Right Arm, Patient Position: Sitting, Cuff Size: Large)   Pulse (!) 105   Ht 5' 6.5" (1.689 m)   Wt (!) 338 lb 12.8 oz (153.7 kg)   SpO2 94%   BMI 53.86 kg/m  Body mass index is 53.86 kg/m.   Wt Readings from Last 3 Encounters:  05/14/21 (!) 338 lb 12.8 oz (153.7 kg)  01/12/21 (!) 353 lb (160.1 kg)  09/08/20 (!) 352 lb 3.2 oz (159.8 kg)   Constitutional: overweight, in NAD Eyes: PERRLA, EOMI, no exophthalmos ENT: moist mucous membranes, no thyromegaly, no cervical  lymphadenopathy Cardiovascular: RRR, No MRG, + LE periankle edema L>R Respiratory: CTA B Gastrointestinal: abdomen soft, NT, ND, BS+ Musculoskeletal: no deformities, strength intact in all 4 Skin: moist, warm, + stasis dermatitis rash bilateral lower legs Neurological: no tremor with outstretched hands, DTR normal in all 4  ASSESSMENT: 1. DM2, insulin-dependent, uncontrolled, with complications - PN - stable, worse in L foot - DR  2. PN  3.  Obesity class III  PLAN:  1. Patient with history of uncontrolled type 2 diabetes, on a complex medication regimen including oral medications (metformin, sulfonylurea, basal-bolus insulin regimen and also weekly GLP-1 receptor agonist, with still poor control.  At last visit, HbA1c was slightly better, at 7.5%.  Sugars were improved and he felt that this was related to stopping bisoprolol approximately 3 weeks prior to the appointment.  However, he continues to have hyperglycemic spikes after dietary indiscretions.  Due to the improvement in the blood sugars and the absence of lows, we continued the same regimen and again emphasized the importance of working on his diet. -At today's visit, sugars are still above target in the morning most of the time except for a period of time of approximately a week, when they were well controlled, however, 3 days ago, after 1 July, they started to increase.  For now, we discussed about trying to start on the Dexcom CGM, in which he is interested in this appears to be covered by his insurance.  I advised him to check with suppliers and give them my name so they can send in a certificate of medical necessity.  For now,  my suggestion would be to continue the current regimen and to continue to work on his diet and especially moving dinners earlier, which he noticed helps. - I advised him to: Patient Instructions  Please continue: - Metformin 2000 mg daily with dinner  - Glipizide 10 mg 2x a day  - Lantus 20 units in a.m.  and 65 units at bedtime - Humalog 35-35-40 units before meals - Trulicity 3 mg weekly  Please continue to work on [employer].  The most common suppliers for the continuous glucose monitor are:  [employer] [phone], extension [phone]  -[employer] [phone]  [employer] [phone]  [facility] [phone]    Please come back for a follow-up appointment in 4 months.  - we checked his HbA1c: 7.7% (higher) - advised to check sugars at different times of the day - 4x a day, rotating check times - advised for yearly eye exams >> he is UTD - will check annual labs today - return to clinic in 4 months  2. HL -Reviewed latest lipid panel from 09/2019: LDL slightly above goal, triglycerides at goal, HDL low: Lab Results  Component Value Date   CHOL 149 09/09/2019   HDL 33.50 (L) 09/09/2019   LDLCALC 85 09/09/2019   TRIG 150.0 (H) 09/09/2019   CHOLHDL 4 09/09/2019  -He did not tolerate statins due to leg cramps -He is due for another lipid panel but he continues to prefer to have this checked by PCP due to insurance coverage  3.  Obesity class III -She saw nutrition in 10/2019 -We will continue Trulicity which should also help with weight loss -Again discussed about the importance of improving diet -Weight was stable at last visit, but lost 15 pounds since then  Component     Latest Ref Rng & Units 05/14/2021  Sodium     135 - 145 mEq/L 141  Potassium     3.5 - 5.1 mEq/L 4.6  Chloride     96 - 112 mEq/L 105  CO2     19 - 32 mEq/L 27  Glucose     70 - 99 mg/dL 92  BUN     6 - 23 mg/dL 27 (H)  Creatinine     0.40 - 1.50 mg/dL 2.56  Total Bilirubin     0.2 - 1.2 mg/dL 0.5  Alkaline Phosphatase     39 - 117 U/L 73  AST     0 - 37 U/L 25  ALT     0 - 53 U/L 30  Total Protein     6.0 - 8.3 g/dL 7.9  Albumin     3.5 - 5.2 g/dL 4.2  GFR     >38.93 mL/min 58.31 (L)  Calcium     8.4 - 10.5 mg/dL 9.9  Cholesterol      0 - 200 mg/dL 734  Triglycerides     0.0 - 149.0 mg/dL 287.6  HDL Cholesterol     >39.00 mg/dL 81.15 (L)  VLDL     0.0 - 40.0 mg/dL 72.6  LDL (calc)     0 - 99 mg/dL 84  Total CHOL/HDL Ratio      4  NonHDL      110.07  Microalb, Ur     0.0 - 1.9 mg/dL 2.1 (H)  Creatinine,U     mg/dL 203.5  MICROALB/CREAT RATIO     0.0 - 30.0 mg/g 1.1   LDL above goal, approximately the same as before.  HDL slightly low.    Carlus Pavlov, MD PhD [facility]

## 2021-05-14 NOTE — Patient Instructions (Addendum)
Please continue: - Metformin 2000 mg daily with dinner  - Glipizide 10 mg 2x a day  - Lantus 20 units in a.m. and 65 units at bedtime - Humalog 35-35-40 units before meals - Trulicity 3 mg weekly  Please continue to work on your diet.  The most common suppliers for the continuous glucose monitor are:  [employer] [phone], extension [phone]  -[employer] [phone]  [employer] [phone]  [facility] [phone]    Please come back for a follow-up appointment in 4 months.

## 2021-06-01 ENCOUNTER — Other Ambulatory Visit: Payer: Self-pay

## 2021-06-01 ENCOUNTER — Ambulatory Visit (INDEPENDENT_AMBULATORY_CARE_PROVIDER_SITE_OTHER): Payer: Medicare Other | Admitting: Sports Medicine

## 2021-06-01 DIAGNOSIS — E1142 Type 2 diabetes mellitus with diabetic polyneuropathy: Secondary | ICD-10-CM

## 2021-06-01 DIAGNOSIS — M1712 Unilateral primary osteoarthritis, left knee: Secondary | ICD-10-CM | POA: Diagnosis not present

## 2021-06-01 DIAGNOSIS — Z0000 Encounter for general adult medical examination without abnormal findings: Secondary | ICD-10-CM | POA: Diagnosis not present

## 2021-06-01 DIAGNOSIS — Z6841 Body Mass Index (BMI) 40.0 and over, adult: Secondary | ICD-10-CM | POA: Diagnosis not present

## 2021-06-01 DIAGNOSIS — Z1211 Encounter for screening for malignant neoplasm of colon: Secondary | ICD-10-CM | POA: Insufficient documentation

## 2021-06-01 DIAGNOSIS — E1165 Type 2 diabetes mellitus with hyperglycemia: Secondary | ICD-10-CM | POA: Diagnosis not present

## 2021-06-01 DIAGNOSIS — IMO0002 Reserved for concepts with insufficient information to code with codable children: Secondary | ICD-10-CM

## 2021-06-01 MED ORDER — PNEUMOCOCCAL 20-VAL CONJ VACC 0.5 ML IM SUSY: 0.5000 mL | PREFILLED_SYRINGE | Freq: Once | INTRAMUSCULAR | 0 refills | Status: AC

## 2021-06-01 MED ORDER — TETANUS-DIPHTH-ACELL PERTUSSIS 5-2.5-18.5 LF-MCG/0.5 IM SUSY: 0.5000 mL | PREFILLED_SYRINGE | Freq: Once | INTRAMUSCULAR | 0 refills | Status: AC

## 2021-06-01 MED ORDER — ARTHROTEC 75-0.2 MG PO TBEC: DELAYED_RELEASE_TABLET | ORAL | 11 refills | Status: DC

## 2021-06-01 MED ORDER — SHINGRIX 50 MCG/0.5ML IM SUSR: 0.5000 mL | Freq: Once | INTRAMUSCULAR | 0 refills | Status: AC

## 2021-06-01 NOTE — Assessment & Plan Note (Signed)
Status post right knee arthroplasty, left knee is hurting, he can call me for injections. I have recommended bariatric surgery as well. He would prefer that we switch him back to the branded Arthrotec 75 twice a day.

## 2021-06-01 NOTE — Assessment & Plan Note (Signed)
Due for some preventative measures, I will send Tdap and Shingrix to his pharmacy, also sending Prevnar 20, he does have a diabetic eye exam coming up with his optometrist, he also has a colonoscopy coming up. Foot exam performed today.

## 2021-06-01 NOTE — Assessment & Plan Note (Addendum)
Currently working with endocrinology. I have suggested consideration of bariatric surgery. I did do his diabetic foot exam today. Charcot foot on the left, failed orthotics. He does have significant diabetic peripheral neuropathy, at follow-up blood work we will probably look for contributing causes such as B12 deficiency. He understands he needs to be checking his feet daily.

## 2021-06-01 NOTE — Progress Notes (Signed)
    Procedures performed today:    None.  Independent interpretation of notes and tests performed by another provider:   None.  Brief History, Exam, Impression, and Recommendations:    Morbid obesity with body mass index (BMI) of 50.0 to 59.9 in adult [facility]) We discussed bariatric surgery, it sounds like he is going to have difficulty getting his Trulicity and insulin in the future on Medicare, this would be an option to potentially cure it, his daughter has had bariatric surgery and is doing well, he is going to think about it.  Annual physical exam Due for some preventative measures, I will send Tdap and Shingrix to his pharmacy, also sending Prevnar 20, he does have a diabetic eye exam coming up with his optometrist, he also has a colonoscopy coming up. Foot exam performed today.   Primary osteoarthritis of left knee Status post right knee arthroplasty, left knee is hurting, he can call me for injections. I have recommended bariatric surgery as well. He would prefer that we switch him back to the branded Arthrotec 75 twice a day.  Uncontrolled type 2 diabetes mellitus with peripheral neuropathy [facility]) Currently working with endocrinology. I have suggested consideration of bariatric surgery. I did do his diabetic foot exam today. Charcot foot on the left, failed orthotics. He does have significant diabetic peripheral neuropathy, at follow-up blood work we will probably look for contributing causes such as B12 deficiency. He understands he needs to be checking his feet daily.    ___________________________________________ Christian [name]. [name], M.D., ABFM., CAQSM. Primary Care and Sports Medicine [facility]  Adjunct Instructor of Family Medicine  [employer]

## 2021-06-01 NOTE — Assessment & Plan Note (Signed)
We discussed bariatric surgery, it sounds like he is going to have difficulty getting his Trulicity and insulin in the future on Medicare, this would be an option to potentially cure it, his daughter has had bariatric surgery and is doing well, he is going to think about it.

## 2021-06-05 ENCOUNTER — Other Ambulatory Visit: Payer: Self-pay | Admitting: Internal Medicine

## 2021-06-05 ENCOUNTER — Other Ambulatory Visit: Payer: Self-pay | Admitting: Family Medicine

## 2021-06-07 ENCOUNTER — Other Ambulatory Visit: Payer: Self-pay | Admitting: Internal Medicine

## 2021-06-07 DIAGNOSIS — IMO0002 Reserved for concepts with insufficient information to code with codable children: Secondary | ICD-10-CM

## 2021-06-07 DIAGNOSIS — E1165 Type 2 diabetes mellitus with hyperglycemia: Secondary | ICD-10-CM

## 2021-06-10 ENCOUNTER — Other Ambulatory Visit: Payer: Self-pay | Admitting: Internal Medicine

## 2021-07-24 ENCOUNTER — Telehealth: Payer: Self-pay | Admitting: Sports Medicine

## 2021-07-24 NOTE — Telephone Encounter (Signed)
Left message for patient to call back and schedule Medicare Annual Wellness Visit (AWV) either virtually or in office. Left # [phone]     Last AWV 08/31/17  please schedule at anytime with health coach

## 2021-08-03 ENCOUNTER — Encounter: Payer: Self-pay | Admitting: Orthopaedic Surgery

## 2021-08-03 ENCOUNTER — Ambulatory Visit (INDEPENDENT_AMBULATORY_CARE_PROVIDER_SITE_OTHER): Payer: No Typology Code available for payment source | Admitting: Orthopaedic Surgery

## 2021-08-03 VITALS — Ht 66.5 in | Wt 340.0 lb

## 2021-08-03 DIAGNOSIS — M1712 Unilateral primary osteoarthritis, left knee: Secondary | ICD-10-CM

## 2021-08-03 DIAGNOSIS — G8929 Other chronic pain: Secondary | ICD-10-CM | POA: Diagnosis not present

## 2021-08-03 DIAGNOSIS — M25562 Pain in left knee: Secondary | ICD-10-CM | POA: Diagnosis not present

## 2021-08-03 MED ORDER — METHYLPREDNISOLONE ACETATE 40 MG/ML IJ SUSP
40.0000 mg | INTRAMUSCULAR | Status: AC | PRN
  Administered 2021-08-03: 40 mg via INTRA_ARTICULAR

## 2021-08-03 MED ORDER — LIDOCAINE HCL 1 % IJ SOLN
3.0000 mL | INTRAMUSCULAR | Status: AC | PRN
  Administered 2021-08-03: 3 mL

## 2021-08-03 NOTE — Progress Notes (Signed)
Office Visit Note   Patient: Christian [name]           Date of Birth: 12-05-1950           MRN: [id number] Visit Date: 08/03/2021              Requested by: Monica Becton, MD [address] [place],  [place] [address] PCP: Monica Becton, MD   Assessment & Plan: Visit Diagnoses:  1. Chronic pain of left knee   2. Unilateral primary osteoarthritis, left knee     Plan: He completely understands that he needs to watch his blood glucose closely and work on weight loss.  I did provide a steroid injection in his left knee today and I diluted the steroid just a little bit.  We can see him back in 3 months to see how he is doing overall.  Hopefully in October his hemoglobin A1c will be trending in the correct direction and his weight will be getting less.  Follow-Up Instructions: Return in about 3 months (around 11/02/2021).   Orders:  Orders Placed This Encounter  Procedures   Large Joint Inj   No orders of the defined types were placed in this encounter.     Procedures: Large Joint Inj on 08/03/2021 9:58 AM Indications: diagnostic evaluation and pain Details: 22 G 1.5 in needle, superolateral approach  Arthrogram: No  Medications: 3 mL lidocaine 1 %; 40 mg methylPREDNISolone acetate 40 MG/ML Outcome: tolerated well, no immediate complications Procedure, treatment alternatives, risks and benefits explained, specific risks discussed. Consent was given by the patient. Immediately prior to procedure a time out was called to verify the correct patient, procedure, equipment, support staff and site/side marked as required. Patient was prepped and draped in the usual sterile fashion.      Clinical Data: No additional findings.   Subjective: Chief Complaint  Patient presents with   Left Knee - Pain, Follow-up   Left Shoulder - Pain  Patient is well-known to me.  He has severe end-stage arthritis in his left knee.  He does ambit with a cane.  His  weight is 340 and his BMI is 54.  His blood glucose was 200 this morning.  He says that his due to eating significant last night for his anniversary.  We have replaced his right knee back in 2016.  He understands with his significant weight gain and his blood glucose being higher with a hemoglobin A1c of 7.7, we would not be able to safely proceed with knee replacement surgery anytime soon.  HPI  Review of Systems   Objective: Vital Signs: Ht 5' 6.5" (1.689 m)   Wt (!) 340 lb (154.2 kg)   BMI 54.06 kg/m   Physical Exam He is alert and oriented x3 and in no acute distress Ortho Exam Examination of his left knee shows global tenderness.  There is varus malalignment and pain with patellofemoral crepitation and medial and lateral joint line tenderness. Specialty Comments:  No specialty comments available.  Imaging: No results found. Previous x-rays show end-stage tricompartment arthritis of the left knee.  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 of   Gout 09/13/2013   Hemorrhoids    Hypertension    Morbid obesity with body mass index of 45.0-49.9 in adult [facility]) 09/13/2013   Osteoarthritis 09/13/2013    Pneumonia    hx. of   Seizures (HCC)    Sleep apnea    has never used C-pap machine due to insurance cost   Type 1 diabetes mellitus, uncontrolled (HCC)     Family History  Problem Relation Age of Onset   Alcohol abuse Father    Arthritis Maternal Grandmother    Alcohol abuse Paternal Grandmother    Alcohol abuse Paternal Grandfather    Diabetes Maternal Aunt     Past Surgical History:  Procedure Laterality Date   broken toe 35 years ago     TOTAL KNEE ARTHROPLASTY Right 05/01/2015   Procedure: RIGHT TOTAL KNEE ARTHROPLASTY;  Surgeon: Kathryne Hitch, MD;  Location: [facility];  Service: Orthopedics;  Laterality: Right;   WISDOM TOOTH EXTRACTION     Social History   Occupational History   Not on file  Tobacco Use   Smoking status: Former    Types: Cigarettes    Quit date: 03/08/2004    Years since quitting: 17.4   Smokeless tobacco: Never  Substance and Sexual Activity   Alcohol use: No    Comment: rare   Drug use: No   Sexual activity: Yes    Partners: Female

## 2021-08-10 ENCOUNTER — Other Ambulatory Visit: Payer: Self-pay | Admitting: Internal Medicine

## 2021-09-07 ENCOUNTER — Other Ambulatory Visit: Payer: Self-pay | Admitting: Internal Medicine

## 2021-09-07 NOTE — Telephone Encounter (Signed)
Pt has been scheduled for tomorrow with Dr T. AM

## 2021-09-08 ENCOUNTER — Ambulatory Visit (INDEPENDENT_AMBULATORY_CARE_PROVIDER_SITE_OTHER): Payer: No Typology Code available for payment source | Admitting: Sports Medicine

## 2021-09-08 DIAGNOSIS — M7022 Olecranon bursitis, left elbow: Secondary | ICD-10-CM | POA: Diagnosis not present

## 2021-09-08 DIAGNOSIS — M1712 Unilateral primary osteoarthritis, left knee: Secondary | ICD-10-CM | POA: Diagnosis not present

## 2021-09-08 DIAGNOSIS — G8929 Other chronic pain: Secondary | ICD-10-CM | POA: Diagnosis not present

## 2021-09-08 DIAGNOSIS — M25512 Pain in left shoulder: Secondary | ICD-10-CM | POA: Diagnosis not present

## 2021-09-08 MED ORDER — ARTHROTEC 75-0.2 MG PO TBEC: DELAYED_RELEASE_TABLET | ORAL | 11 refills | Status: DC

## 2021-09-08 NOTE — Assessment & Plan Note (Signed)
I think Christian [name] has a chronically torn rotator cuff, at this point he will probably need a reverse arthroplasty, he will think about it and let me know if he would like to proceed.

## 2021-09-08 NOTE — Assessment & Plan Note (Signed)
Patient endorses a possible history of gout, he has swelling on the left posterior elbow consistent with an olecranon bursitis, popped up overnight, aspiration and injection today. Crystal analysis. Return in a month, we will check uric acid levels and potentially start allopurinol for goal of less than 5 if elevated

## 2021-09-08 NOTE — Progress Notes (Signed)
    Procedures performed today:    Procedure: Aspiration/injection of right olecranon bursitis Consent obtained and verified. Time-out conducted. Noted no overlying erythema, induration, or other signs of local infection. Skin prepped in a sterile fashion. Topical analgesic spray: Ethyl chloride. Completed without difficulty. Meds: Using an 18-gauge needle aspirated approximately 6 mL of cloudy, straw-colored fluid, syringe switched 1 cc kenalog 40, 1 cc lidocaine injected easily. Advised to call if fevers/chills, erythema, induration, drainage, or persistent bleeding.  Independent interpretation of notes and tests performed by another provider:   None.  Brief History, Exam, Impression, and Recommendations:    Olecranon bursitis, left elbow Patient endorses a possible history of gout, he has swelling on the left posterior elbow consistent with an olecranon bursitis, popped up overnight, aspiration and injection today. Crystal analysis. Return in a month, we will check uric acid levels and potentially start allopurinol for goal of less than 5 if elevated  Chronic left shoulder pain I think Christian [name] has a chronically torn rotator cuff, at this point he will probably need a reverse arthroplasty, he will think about it and let me know if he would like to proceed.    ___________________________________________ [name]. [name], M.D., ABFM., CAQSM. Primary Care and Sports Medicine [facility]  Adjunct Instructor of Family Medicine  [employer]

## 2021-09-08 NOTE — Addendum Note (Signed)
Addended by: Gaynelle Arabian on: 09/08/2021 11:48 AM   Modules accepted: Orders

## 2021-09-08 NOTE — Addendum Note (Signed)
Addended by: Monica Becton on: 09/08/2021 11:33 AM   Modules accepted: Orders

## 2021-09-09 LAB — CELL COUNT AND DIFF, FLUID, OTHER
Basophils, %: 0 %
Eosinophils, %: 0 %
Lymphocytes, %: 28 %
Mesothelial, %: 0 %
Monocyte/Macrophage %: 0 %
Neutrophils, %: 72 %
Total Nucleated Cell Ct: 5193 cells/uL

## 2021-09-09 LAB — SYNOVIAL FLUID, CRYSTAL

## 2021-09-13 NOTE — Progress Notes (Signed)
Patient ID: EDIN COMAR, male   DOB: 08/13/1951, 70 y.o.   MRN: [id number]  This visit occurred during the SARS-CoV-2 public health emergency.  Safety protocols were in place, including screening questions prior to the visit, additional usage of staff PPE, and extensive cleaning of exam room while observing appropriate contact time as indicated for disinfecting solutions.   HPI Christian [name] is a 70 y.o.-year-old male, returning for f/u for DM2, dx 2009, insulin-dependent since 2014, uncontrolled, with complications (DR, Peripheral neuropathy). Last visit: 4 months ago. [name] [facility] [employer].  His wife will retire soon so he may not have access to the medications that we are using now.  He is applying for VA benefits. No PCP now after Dr. Prince Rome left.  Interim history: No increased urination, blurry vision, nausea, chest pain. He has imbalance and walks with a cane. He got a steroid inj in the left elbow bursa last week and one in his knees before >> sugars high >> upper 200s. Is preparing to go to the beach for Christmas.  Reviewed HbA1c levels: Lab Results  Component Value Date   HGBA1C 7.7 (A) 05/14/2021   HGBA1C 7.5 (A) 01/12/2021   HGBA1C 7.8 (A) 09/08/2020   HGBA1C 7.0 (A) 05/14/2020   HGBA1C 7.5 (A) 01/09/2020   HGBA1C 6.9 (A) 09/09/2019   HGBA1C 7.6 (A) 05/08/2019   HGBA1C 8.2 (A) 01/08/2019   HGBA1C 7.7 (A) 09/25/2018   HGBA1C 7.4 (A) 05/25/2018   HGBA1C 9.0 02/22/2018   HGBA1C 9.1 11/23/2017   HGBA1C 9 08/24/2017   HGBA1C 8.4 05/25/2017   HGBA1C 8.8 02/06/2017   HGBA1C 8.8 11/08/2016   HGBA1C 7.3 08/03/2016   HGBA1C 7.7 05/03/2016   HGBA1C 7.6 01/27/2016   HGBA1C 7.4 10/29/2015  08/24/2017: HbA1c 9%  Pt is on: - Metformin 1000 mg 2x a daily >> 2000 mg with dinner - Glipizide 10 mg 2x a day  - Trulicity 1.5 >> 3 mg weekly (missed 2 weeks 4 weeks ago) - Lantus 20 units in a.m. and 65 units at bedtime - Humalog 30-35 >> 30-40 units 3x a day before meals >>  35(-35)-40 units before meals Stopped Jardiance 25 mg >> but too expensive - in the donut hole; had yeast infections. Glipizide and Invokana were started 03/2014.  He has been on Victoza in the past >> not efficient anymore.  Pt checks his sugars 0 to once a day: - am: 80, 107-150, 222 (pizza), 230 >> 84-210, 226, 280 >> 85, 135-160s, 258 - 2h after b'fast: n/c >> 157 >> n/c - before lunch: 280 >> 125 >> 190-200 >> n/c - 2h after lunch: 230-235 >> 220-230 >> n/c >> 190-220 >> n/c - before dinner: 180s >> n/c >> 200s >> n/c  >> 150-185 - 2h after dinner: n/c  - bedtime:   165-180 >> n/c >> 230, 281 >> 180s, 333 >> n/c - nighttime: n/c  Lowest sugar was  80 >> 83 >> 85, he has hypoglycemia awareness in the 90s Highest sugar was  333 >> 280 >> 258.  Meter: OneTouch Ultra Mini >> [name]  -No CKD, last BUN/creatinine:  Lab Results  Component Value Date   BUN 27 (H) 05/14/2021   CREATININE 1.25 05/14/2021  Not on ACE inhibitor/ARB.  Normal ACR: Lab Results  Component Value Date   MICRALBCREAT 1.1 05/14/2021   MICRALBCREAT 0.7 09/09/2019   MICRALBCREAT 1.1 08/31/2017   MICRALBCREAT 0.8 08/20/2015   MICRALBCREAT 0.3 09/11/2013   Normal GFR levels: Lab  Results  Component Value Date   GFRNONAA 84 09/09/2019   GFRNONAA >60 05/02/2015   GFRNONAA >60 04/27/2015   -+ HL; last set of lipids: Lab Results  Component Value Date   CHOL 142 05/14/2021   HDL 32.30 (L) 05/14/2021   LDLCALC 84 05/14/2021   TRIG 131.0 05/14/2021   CHOLHDL 4 05/14/2021  No statins due to leg cramps.  - last eye exam was in 2022: Reportedly + DR  -+ numbness and tingling in his feet  He had R TKR in 04/2015.  He has steroid inj in L knee and L shoulder.  Also uses CBD oil liniment and this helps. She has OSA.  ROS: + See HPI + joint aches  I reviewed pt's medications, allergies, PMH, social hx, family hx, and changes were documented in the history of present illness. Otherwise, unchanged from my  initial visit note.   Past Medical History:  Diagnosis Date   Chronic back pain 09/13/2013   Chronic pain syndrome 09/13/2013   GERD (gastroesophageal reflux disease)    hx. of   Gout 09/13/2013   Hemorrhoids    Hypertension    Morbid obesity with body mass index of 45.0-49.9 in adult [facility]) 09/13/2013   Osteoarthritis 09/13/2013   Pneumonia    hx. of   Seizures (HCC)    Sleep apnea    has never used C-pap machine due to insurance cost   Type 1 diabetes mellitus, uncontrolled (HCC)    Past Surgical History:  Procedure Laterality Date   broken toe 35 years ago     TOTAL KNEE ARTHROPLASTY Right 05/01/2015   Procedure: RIGHT TOTAL KNEE ARTHROPLASTY;  Surgeon: Kathryne Hitch, MD;  Location: [facility];  Service: Orthopedics;  Laterality: Right;   WISDOM TOOTH EXTRACTION     Social History   Socioeconomic History   Marital status: Married    Spouse name: Not on file   Number of children: Not on file   Years of education: Not on file   Highest education level: Not on file  Occupational History   Not on file  Tobacco Use   Smoking status: Former    Types: Cigarettes    Quit date: 03/08/2004    Years since quitting: 17.5   Smokeless tobacco: Never  Substance and Sexual Activity   Alcohol use: No    Comment: rare   Drug use: No   Sexual activity: Yes    Partners: Female  Other Topics Concern   Not on file  Social History Narrative   Psychiatric nurse for [employer]   Married   Social Determinants of Health   Financial Resource Strain: Not on file  Food Insecurity: Not on file  Transportation Needs: Not on file  Physical Activity: Not on file  Stress: Not on file  Social Connections: Not on file  Intimate Partner Violence: Not on file   Current Outpatient Medications on File Prior to Visit  Medication Sig Dispense Refill   ARTHROTEC 75-0.2 MG TBEC 1 tab p.o. twice daily, branded Arthrotec please 60 tablet 11   aspirin 81 MG tablet Take 81 mg by mouth once.      bisoprolol (ZEBETA) 5 MG tablet Take 1 PO qd x 1 week, then 1/2 PO qd x 1 week, then 1/2 PO qod x 1 week, then stop 30 tablet 0   glipiZIDE (GLUCOTROL) 10 MG tablet TAKE 1 TABLET BY MOUTH  TWICE DAILY BEFORE MEALS 180 tablet 3   HUMALOG KWIKPEN 200 UNIT/ML  KwikPen INJECT 25 TO 35 UNITS INTO THE SKIN 3 TIMES DAILY. 48 mL 2   hydrochlorothiazide (HYDRODIURIL) 12.5 MG tablet TAKE 1/2 TO 1 TABLET BY MOUTH EVERY DAY AS NEEDED FOR EDEMA 90 tablet 2   Insulin Pen Needle (NOVOTWIST) 32G X 5 MM MISC USE AS DIRECTED 5 TIMES A DAY WITH INSULIN INJECTIONS E11.65 500 each 5   LANTUS SOLOSTAR 100 UNIT/ML Solostar Pen INJECT 20 UNITS IN MORNING AND 65 UNITS AT NIGHT UNDER SKIN 60 mL 2   metFORMIN (GLUCOPHAGE) 1000 MG tablet TAKE 1 TABLET BY MOUTH  TWICE DAILY WITH A MEAL 180 tablet 3   montelukast (SINGULAIR) 10 MG tablet TAKE 1 TABLET BY MOUTH AT BEDTIME AS NEEDED. 90 tablet 2   TRULICITY 3 MG/0.5ML SOPN INJECT 3 MG INTO THE SKIN ONCE A WEEK. 2 mL 3   No current facility-administered medications on file prior to visit.   Allergies  Allergen Reactions   Penicillins     REACTION: swelling, rash   Family History  Problem Relation Age of Onset   Alcohol abuse Father    Arthritis Maternal Grandmother    Alcohol abuse Paternal Grandmother    Alcohol abuse Paternal Grandfather    Diabetes Maternal Aunt     PE: BP 140/84 (BP Location: Right Arm, Patient Position: Sitting, Cuff Size: Normal)   Pulse 99   Ht 5' 6.5" (1.689 m)   Wt (!) 333 lb 6.4 oz (151.2 kg)   SpO2 95%   BMI 53.01 kg/m  Body mass index is 53.01 kg/m.   Wt Readings from Last 3 Encounters:  09/14/21 (!) 333 lb 6.4 oz (151.2 kg)  08/03/21 (!) 340 lb (154.2 kg)  05/14/21 (!) 338 lb 12.8 oz (153.7 kg)   Constitutional: overweight, in NAD Eyes: PERRLA, EOMI, no exophthalmos ENT: moist mucous membranes, no thyromegaly, no cervical lymphadenopathy Cardiovascular: RRR, No MRG, + LE periankle edema L>R Respiratory: CTA  B Gastrointestinal: abdomen soft, NT, ND, BS+ Musculoskeletal: no deformities, strength intact in all 4 Skin: moist, warm, + stasis dermatitis rash bilateral lower legs Neurological: no tremor with outstretched hands, DTR normal in all 4  ASSESSMENT: 1. DM2, insulin-dependent, uncontrolled, with complications - PN - stable, worse in L foot - DR  2. PN  3.  Obesity class III  PLAN:  1. Patient with history of uncontrolled type 2 diabetes, on a complex medication regimen including oral medications (metformin, sulfonylurea), basal-bolus insulin regimen and also weekly GLP-1 receptor agonist, with still poor control.  At last visit, HbA1c was higher, at 7.7%.  At that time, sugars were still above target in the morning most of the time.  He was preparing to start the Dexcom CGM so at that time my suggestion was to continue the previous regimen, which is already quite intense and discussed about the possibility of the Dexcom CGM influencing his behavior in a positive manner, hopefully improving his  blood sugars. -At this visit, sugars appear to be above target, and particularly high after steroid injections, in the upper 200s.  He is not usually checking sugars later in the day, but when he does, sugars are higher before dinner.  He mentions that he usually eats approximately 2 meals a day. -At this visit, we discussed about switching from Lantus and Humalog to U500 insulin.  He is getting approximately 168 units of insulin a day for now so I advised him to take 100 units in the morning, 60 units before dinner and, if he is eating  lunch, to take 40 units before his meal.  I am hoping that U500 would be covered for him.  His insurance will change after this week. -I would like to see him back in a month and a half after the above changes.  I advised him to try to keep good records of his blood sugars until then and let me know if he has problems with lows. - I advised him to: Patient Instructions   Please continue: - Metformin 2000 mg daily with dinner  - Glipizide 10 mg 2x a day  - Trulicity 3 mg weekly  Please change insulin to: - U500 30 min before meals: 100 units before b'fast 40 units before lunch  60 units before dinner  Please continue to work on your diet.  Please come back for a follow-up appointment in 1.5 months.  - we checked his HbA1c: 8.4% (higher) - advised to check sugars at different times of the day - 3x a day, rotating check times - advised for yearly eye exams >> he is UTD - return to clinic in 1.5 months  2. HL -Reviewed latest lipid panel from 05/2021: LDL slightly above target, HDL slightly low: Lab Results  Component Value Date   CHOL 142 05/14/2021   HDL 32.30 (L) 05/14/2021   LDLCALC 84 05/14/2021   TRIG 131.0 05/14/2021   CHOLHDL 4 05/14/2021  -He did not tolerate statins due to leg cramps  3.  Obesity class III -He did see nutrition in 10/2019 -We will continue Trulicity which should also help with weight loss -We discussed at last visit about the importance of improving diet -Lost 15 pounds before last visit, lost 5 since then  Carlus Pavlov, MD PhD [facility] Endocrin[facility]

## 2021-09-14 ENCOUNTER — Other Ambulatory Visit: Payer: Self-pay

## 2021-09-14 ENCOUNTER — Encounter: Payer: Self-pay | Admitting: Internal Medicine

## 2021-09-14 ENCOUNTER — Ambulatory Visit (INDEPENDENT_AMBULATORY_CARE_PROVIDER_SITE_OTHER): Payer: No Typology Code available for payment source | Admitting: Internal Medicine

## 2021-09-14 VITALS — BP 140/84 | HR 99 | Ht 66.5 in | Wt 333.4 lb

## 2021-09-14 DIAGNOSIS — E1169 Type 2 diabetes mellitus with other specified complication: Secondary | ICD-10-CM | POA: Diagnosis not present

## 2021-09-14 DIAGNOSIS — E1165 Type 2 diabetes mellitus with hyperglycemia: Secondary | ICD-10-CM | POA: Diagnosis not present

## 2021-09-14 DIAGNOSIS — E1142 Type 2 diabetes mellitus with diabetic polyneuropathy: Secondary | ICD-10-CM | POA: Diagnosis not present

## 2021-09-14 DIAGNOSIS — Z6841 Body Mass Index (BMI) 40.0 and over, adult: Secondary | ICD-10-CM | POA: Diagnosis not present

## 2021-09-14 DIAGNOSIS — E785 Hyperlipidemia, unspecified: Secondary | ICD-10-CM | POA: Diagnosis not present

## 2021-09-14 LAB — POCT GLYCOSYLATED HEMOGLOBIN (HGB A1C): Hemoglobin A1C: 8.4 % — AB (ref 4.0–5.6)

## 2021-09-14 MED ORDER — HUMULIN R U-500 KWIKPEN 500 UNIT/ML ~~LOC~~ SOPN: PEN_INJECTOR | SUBCUTANEOUS | 3 refills | Status: DC

## 2021-09-14 NOTE — Patient Instructions (Addendum)
Please continue: - Metformin 2000 mg daily with dinner  - Glipizide 10 mg 2x a day  - Trulicity 3 mg weekly  Please change insulin to: - U500 30 min before meals: 100 units before b'fast 40 units before lunch  60 units before dinner  Please continue to work on your diet.  Please come back for a follow-up appointment in 1.5 months.

## 2021-09-23 ENCOUNTER — Other Ambulatory Visit: Payer: Self-pay | Admitting: Internal Medicine

## 2021-09-23 DIAGNOSIS — E1142 Type 2 diabetes mellitus with diabetic polyneuropathy: Secondary | ICD-10-CM

## 2021-09-24 ENCOUNTER — Telehealth: Payer: Self-pay

## 2021-09-24 NOTE — Telephone Encounter (Signed)
Medication: ARTHROTEC 75-0.2 MG TBEC Prior authorization submitted via CoverMyMeds on 09/24/2021 PA submission pending

## 2021-09-27 NOTE — Telephone Encounter (Signed)
Notification that PA needed to be completed via [employer]. Unable to submit via website. Re-submitted via CoverMyMeds

## 2021-10-06 ENCOUNTER — Ambulatory Visit: Payer: Medicare Other | Admitting: Sports Medicine

## 2021-10-07 ENCOUNTER — Telehealth: Payer: Self-pay

## 2021-10-07 LAB — ANAEROBIC AND AEROBIC CULTURE
AER RESULT:: NO GROWTH
MICRO NUMBER:: 12587414
MICRO NUMBER:: 12587415
SPECIMEN QUALITY:: ADEQUATE
SPECIMEN QUALITY:: ADEQUATE

## 2021-10-07 NOTE — Telephone Encounter (Signed)
Medication: ARTHROTEC 75-0.2 MG TBEC Prior authorization determination received Medication has been denied Reason for denial:  Awaiting denial letter

## 2021-10-07 NOTE — Telephone Encounter (Signed)
Called and spoke with the pharmacy and pt had refills on file and his rx can be picked up in 2 hours. Pt notified.

## 2021-10-07 NOTE — Telephone Encounter (Signed)
Pt is taken 200u daily and he is about to be out of med which will last up until 10/21/2021. He is going out of town and 12/18.   MEDICATION: Humulin 500    PHARMACY:  [facility] [address]    HAS THE PATIENT CONTACTED THEIR PHARMACY?  yes    IS THIS A 90 DAY SUPPLY : yes    IS PATIENT OUT OF MEDICATION: No    IF NOT; HOW MUCH IS LEFT: Enough for 2 more doses    LAST APPOINTMENT DATE: @    NEXT APPOINTMENT DATE:@12 /27/2022    DO WE HAVE YOUR PERMISSION TO LEAVE A DETAILED MESSAGE?:    OTHER COMMENTS:     **Let patient know to contact pharmacy at the end of the day to make sure medication is ready. **    ** Please notify patient to allow 48-72 hours to process**    **Encourage patient to contact the pharmacy for refills or they can request refills through [facility]**

## 2021-10-15 ENCOUNTER — Ambulatory Visit (INDEPENDENT_AMBULATORY_CARE_PROVIDER_SITE_OTHER): Payer: No Typology Code available for payment source

## 2021-10-15 ENCOUNTER — Ambulatory Visit (INDEPENDENT_AMBULATORY_CARE_PROVIDER_SITE_OTHER): Payer: No Typology Code available for payment source | Admitting: Sports Medicine

## 2021-10-15 ENCOUNTER — Other Ambulatory Visit: Payer: Self-pay

## 2021-10-15 DIAGNOSIS — M7022 Olecranon bursitis, left elbow: Secondary | ICD-10-CM | POA: Diagnosis not present

## 2021-10-15 DIAGNOSIS — M7541 Impingement syndrome of right shoulder: Secondary | ICD-10-CM

## 2021-10-15 DIAGNOSIS — Z1211 Encounter for screening for malignant neoplasm of colon: Secondary | ICD-10-CM | POA: Diagnosis not present

## 2021-10-15 DIAGNOSIS — M25511 Pain in right shoulder: Secondary | ICD-10-CM

## 2021-10-15 DIAGNOSIS — Z0000 Encounter for general adult medical examination without abnormal findings: Secondary | ICD-10-CM

## 2021-10-15 NOTE — Assessment & Plan Note (Signed)
I did order his Tdap, Shingrix, Prevnar 20 at the last visit, he has not yet had these done, plans to get them at the beginning of the new year. He also is working hard with his endocrinologist for his diabetes. Would rather do Cologuard and tells me he has had normal colonoscopies along the way (last colonoscopy was clear in 2011), ordering Cologuard now.

## 2021-10-15 NOTE — Assessment & Plan Note (Signed)
Persistent right shoulder pain, pain localized over the deltoid, worse with overhead activities. Positive empty can sign on exam today, he does have a trip coming up to the beach and would like an injection today. Historically injections have resulted in blood sugars running very high, he is on a new insulin that has been doing a much better job of controlling his sugars, subacromial injection today, x-rays, home conditioning exercises given, return to see me in 6 weeks.

## 2021-10-15 NOTE — Progress Notes (Signed)
    Procedures performed today:    Procedure: Real-time Ultrasound Guided injection of the right subacromial bursa Device: Samsung HS60  Verbal informed consent obtained.  Time-out conducted.  Noted no overlying erythema, induration, or other signs of local infection.  Skin prepped in a sterile fashion.  Local anesthesia: Topical Ethyl chloride.  With sterile technique and under real time ultrasound guidance: Noted deficient cuff, 1 cc Kenalog 40, 1 cc lidocaine, 1 cc bupivacaine injected easily Completed without difficulty  Advised to call if fevers/chills, erythema, induration, drainage, or persistent bleeding.  Images permanently stored and available for review in PACS.  Impression: Technically successful ultrasound guided injection.  Independent interpretation of notes and tests performed by another provider:   None.  Brief History, Exam, Impression, and Recommendations:    Impingement syndrome, shoulder, right Persistent right shoulder pain, pain localized over the deltoid, worse with overhead activities. Positive empty can sign on exam today, he does have a trip coming up to the beach and would like an injection today. Historically injections have resulted in blood sugars running very high, he is on a new insulin that has been doing a much better job of controlling his sugars, subacromial injection today, x-rays, home conditioning exercises given, return to see me in 6 weeks.  Olecranon bursitis, left elbow Completely resolved with aspiration and injection, crystal analysis was negative.  Annual physical exam I did order his Tdap, Shingrix, Prevnar 20 at the last visit, he has not yet had these done, plans to get them at the beginning of the new year. He also is working hard with his endocrinologist for his diabetes. Would rather do Cologuard and tells me he has had normal colonoscopies along the way (last colonoscopy was clear in 2011), ordering Cologuard  now.    ___________________________________________ [name]. [name], M.D., ABFM., CAQSM. Primary Care and Sports Medicine [facility]  Adjunct Instructor of Family Medicine  [employer]

## 2021-10-15 NOTE — Assessment & Plan Note (Signed)
Completely resolved with aspiration and injection, crystal analysis was negative.

## 2021-10-18 ENCOUNTER — Other Ambulatory Visit: Payer: Self-pay | Admitting: Internal Medicine

## 2021-10-18 DIAGNOSIS — E1142 Type 2 diabetes mellitus with diabetic polyneuropathy: Secondary | ICD-10-CM

## 2021-11-02 ENCOUNTER — Encounter: Payer: Self-pay | Admitting: Internal Medicine

## 2021-11-02 ENCOUNTER — Ambulatory Visit (INDEPENDENT_AMBULATORY_CARE_PROVIDER_SITE_OTHER): Payer: No Typology Code available for payment source | Admitting: Orthopaedic Surgery

## 2021-11-02 ENCOUNTER — Encounter: Payer: Self-pay | Admitting: Orthopaedic Surgery

## 2021-11-02 ENCOUNTER — Ambulatory Visit (INDEPENDENT_AMBULATORY_CARE_PROVIDER_SITE_OTHER): Payer: Medicare Other | Admitting: Internal Medicine

## 2021-11-02 ENCOUNTER — Other Ambulatory Visit: Payer: Self-pay

## 2021-11-02 ENCOUNTER — Ambulatory Visit: Payer: Medicare Other | Admitting: Internal Medicine

## 2021-11-02 VITALS — BP 118/80 | HR 94 | Ht 66.5 in | Wt 348.4 lb

## 2021-11-02 DIAGNOSIS — Z794 Long term (current) use of insulin: Secondary | ICD-10-CM

## 2021-11-02 DIAGNOSIS — Z6841 Body Mass Index (BMI) 40.0 and over, adult: Secondary | ICD-10-CM

## 2021-11-02 DIAGNOSIS — E785 Hyperlipidemia, unspecified: Secondary | ICD-10-CM

## 2021-11-02 DIAGNOSIS — E1169 Type 2 diabetes mellitus with other specified complication: Secondary | ICD-10-CM | POA: Diagnosis not present

## 2021-11-02 DIAGNOSIS — G8929 Other chronic pain: Secondary | ICD-10-CM

## 2021-11-02 DIAGNOSIS — E1142 Type 2 diabetes mellitus with diabetic polyneuropathy: Secondary | ICD-10-CM

## 2021-11-02 DIAGNOSIS — M1712 Unilateral primary osteoarthritis, left knee: Secondary | ICD-10-CM

## 2021-11-02 DIAGNOSIS — E1165 Type 2 diabetes mellitus with hyperglycemia: Secondary | ICD-10-CM | POA: Diagnosis not present

## 2021-11-02 DIAGNOSIS — M25562 Pain in left knee: Secondary | ICD-10-CM

## 2021-11-02 MED ORDER — TRULICITY 3 MG/0.5ML ~~LOC~~ SOAJ
3.0000 mg | SUBCUTANEOUS | 3 refills | Status: DC
Start: 2021-11-02 — End: 2022-10-03

## 2021-11-02 MED ORDER — METHYLPREDNISOLONE ACETATE 40 MG/ML IJ SUSP
40.0000 mg | INTRAMUSCULAR | Status: AC | PRN
  Administered 2021-11-02: 11:00:00 40 mg via INTRA_ARTICULAR

## 2021-11-02 MED ORDER — HUMULIN R U-500 KWIKPEN 500 UNIT/ML ~~LOC~~ SOPN: PEN_INJECTOR | SUBCUTANEOUS | 3 refills | Status: DC

## 2021-11-02 MED ORDER — LIDOCAINE HCL 1 % IJ SOLN
3.0000 mL | INTRAMUSCULAR | Status: AC | PRN
Start: 2021-11-02 — End: 2021-11-02
  Administered 2021-11-02: 11:00:00 3 mL

## 2021-11-02 NOTE — Progress Notes (Signed)
Office Visit Note   Patient: Christian [name]           Date of Birth: [birth year]           MRN: [id number] Visit Date: 11/02/2021              Requested by: Monica Becton, MD [id number] [place],  [place] [address] PCP: Monica Becton, MD   Assessment & Plan: Visit Diagnoses:  1. Chronic pain of left knee   2. Unilateral primary osteoarthritis, left knee     Plan: I did counsel him about the detrimental effect steroids can have on the body.  I did place a steroid injection in his left knee without difficulty and he knows to watch his blood glucose closely.  The only other thing I would recommend for now is weight loss and outpatient physical therapy.  He will let us know if he wants to try outpatient therapy.  Follow-up is as needed.  If he does come in again at some point, we need a new weight and BMI calculation.  Follow-Up Instructions: Return if symptoms worsen or fail to improve.   Orders:  Orders Placed This Encounter  Procedures   Large Joint Inj   No orders of the defined types were placed in this encounter.     Procedures: Large Joint Inj: L knee on 11/02/2021 11:13 AM Indications: diagnostic evaluation and pain Details: 22 G 1.5 in needle, superolateral approach  Arthrogram: No  Medications: 3 mL lidocaine 1 %; 40 mg methylPREDNISolone acetate 40 MG/ML Outcome: tolerated well, no immediate complications Procedure, treatment alternatives, risks and benefits explained, specific risks discussed. Consent was given by the patient. Immediately prior to procedure a time out was called to verify the correct patient, procedure, equipment, support staff and site/side marked as required. Patient was prepped and draped in the usual sterile fashion.      Clinical Data: No additional findings.   Subjective: Chief Complaint  Patient presents with   Left Knee - Follow-up  The patient is well-known to me.  He has debilitating arthritis  of his left knee.  We actually replaced his right knee remotely.  He is someone with a BMI of in the past over 50.  He is also struggled with blood glucose control.  I saw that his hemoglobin A1c a month ago was 8.4.  He says his primary care physician is adjusted his medications.  He is requesting steroid injection in his left knee today.  He said his daily blood sugars are running now in the 90s.  He is ambulate with a cane.  He has been a fall risk as well.  He says sometimes his right knee gives way.  He has had acute pain with the right shoulder recently.  HPI  Review of Systems Today he denies any fever, chills, nausea, vomiting  Objective: Vital Signs: There were no vitals taken for this visit.  Physical Exam He is alert and orient x3 and in no acute distress Ortho Exam His right shoulder shows that it is well located.  There is good strength with rotator cuff but is painful in the subacromial outlet.  His left knee has a varus malalignment and significant global tenderness and patellofemoral crepitation. Specialty Comments:  No specialty comments available.  Imaging: No results found.   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 of   Gout 09/13/2013   Hemorrhoids    Hypertension    Morbid obesity with body mass index of 45.0-49.9 in adult [facility]) 09/13/2013   Osteoarthritis 09/13/2013   Pneumonia    hx. of   Seizures (HCC)    Sleep apnea    has never used C-pap machine due to insurance cost   Type 1 diabetes mellitus, uncontrolled     Family History  Problem Relation Age of Onset   Alcohol abuse Father    Arthritis Maternal Grandmother    Alcohol abuse Paternal Grandmother    Alcohol abuse Paternal Grandfather    Diabetes Maternal Aunt     Past Surgical History:  Procedure Laterality Date   broken toe 35 years ago     TOTAL KNEE ARTHROPLASTY Right 05/01/2015   Procedure: RIGHT TOTAL KNEE ARTHROPLASTY;  Surgeon: Kathryne Hitch, MD;  Location: [facility];  Service: Orthopedics;  Laterality: Right;   WISDOM TOOTH EXTRACTION     Social History   Occupational History   Not on file  Tobacco Use   Smoking status: Former    Types: Cigarettes    Quit date: 03/08/2004    Years since quitting: 17.6   Smokeless tobacco: Never  Substance and Sexual Activity   Alcohol use: No    Comment: rare   Drug use: No   Sexual activity: Yes    Partners: Female

## 2021-11-02 NOTE — Patient Instructions (Addendum)
Please continue: - Metformin 2000 mg daily with dinner  - Glipizide 10 mg 2x a day  - U500 30 min before meals: 100 units before b'fast 60 units before dinner  Please add back: - Trulicity 3 mg weekly  Please continue to work on [employer].  Please come back for a follow-up appointment in 2 months.

## 2021-11-02 NOTE — Progress Notes (Signed)
Patient ID: Christian [name], male   DOB: [birth year], 70 y.o.   MRN: [id number]  This visit occurred during the SARS-CoV-2 public health emergency.  Safety protocols were in place, including screening questions prior to the visit, additional usage of staff PPE, and extensive cleaning of exam room while observing appropriate contact time as indicated for disinfecting solutions.   HPI Christian [name] is a 70 y.o.-year-old male, returning for f/u for DM2, dx 2009, insulin-dependent since 2014, uncontrolled, with complications (DR, Peripheral neuropathy). Last visit: 1.5 months ago. [name] [employer].  His wife retired and accompanies him today.   No PCP now after Dr. Prince [name] left. He is applying for [facility] benefits.  Interim history: No increased urination, blurry vision, nausea, chest pain. He has imbalance and walks with a cane. Before last visit, he got 2 steroid injections and sugars were higher. He had another steroid inj in elbow >> 318.  At last visit, we changed to U500.  He was able to obtain it and feels that his sugars improved significantly on it. He and his wife just returned from the beach where his son got married.  Reviewed HbA1c levels: Lab Results  Component Value Date   HGBA1C 8.4 (A) 09/14/2021   HGBA1C 7.7 (A) 05/14/2021   HGBA1C 7.5 (A) 01/12/2021   HGBA1C 7.8 (A) 09/08/2020   HGBA1C 7.0 (A) 05/14/2020   HGBA1C 7.5 (A) 01/09/2020   HGBA1C 6.9 (A) 09/09/2019   HGBA1C 7.6 (A) 05/08/2019   HGBA1C 8.2 (A) 01/08/2019   HGBA1C 7.7 (A) 09/25/2018   HGBA1C 7.4 (A) 05/25/2018   HGBA1C 9.0 02/22/2018   HGBA1C 9.1 11/23/2017   HGBA1C 9 08/24/2017   HGBA1C 8.4 05/25/2017   HGBA1C 8.8 02/06/2017   HGBA1C 8.8 11/08/2016   HGBA1C 7.3 08/03/2016   HGBA1C 7.7 05/03/2016   HGBA1C 7.6 01/27/2016  08/24/2017: HbA1c 9%  Pt is on: - Metformin 1000 mg 2x a daily >> 2000 mg with dinner - Glipizide 10 mg 2x a day  - Trulicity 1.5 >> 3 mg weekly >> off for 3 weeks! - U500 30 min  before meals - started 09/2021: 100 units before b'fast {40 units before lunch] 60 units before dinner Stopped Jardiance 25 mg >> but too expensive - in the donut hole; had yeast infections. Glipizide and Invokana were started 03/2014.  He has been on Victoza in the past >> not efficient anymore.  Pt checks his sugars 0 to once a day: - am: 84-210, 226, 280 >> 85, 135-160s, 258 >> 67, 72-148, 166, 243 (steroid) - 2h after b'fast: n/c >> 157 >> n/c >> 172 - before lunch: 280 >> 125 >> 190-200 >> n/c - 2h after lunch: 220-230 >> n/c >> 190-220 >> n/c - before dinner: n/c >> 200s >> n/c  >> 150-185 >> 164 - 2h after dinner: n/c  - bedtime: n/c >> 230, 281 >> 180s, 333 >> n/c - nighttime: n/c  Lowest sugar was  80 >> 83 >> 85 >> 67, he has hypoglycemia awareness in the 90s Highest sugar was  333 >> 280 >> 258 >> 243.  Meter: OneTouch Ultra Mini >> [name]  -No CKD, last BUN/creatinine:  Lab Results  Component Value Date   BUN 27 (H) 05/14/2021   CREATININE 1.25 05/14/2021  Not on ACE inhibitor/ARB.  Normal ACR: Lab Results  Component Value Date   MICRALBCREAT 1.1 05/14/2021   MICRALBCREAT 0.7 09/09/2019   MICRALBCREAT 1.1 08/31/2017   MICRALBCREAT 0.8 08/20/2015  MICRALBCREAT 0.3 09/11/2013   Normal GFR levels: Lab Results  Component Value Date   GFRNONAA 84 09/09/2019   GFRNONAA >60 05/02/2015   GFRNONAA >60 04/27/2015   -+ HL; last set of lipids: Lab Results  Component Value Date   CHOL 142 05/14/2021   HDL 32.30 (L) 05/14/2021   LDLCALC 84 05/14/2021   TRIG 131.0 05/14/2021   CHOLHDL 4 05/14/2021  No statins due to leg cramps.  - last eye exam was in 2022: Reportedly + DR  -+ numbness and tingling in his feet  He had R TKR in 04/2015.  He has steroid inj in L knee and L shoulder.  Also uses CBD oil liniment and this helps. She has OSA.  ROS: + See HPI + joint aches  I reviewed pt's medications, allergies, PMH, social hx, family hx, and changes were  documented in the history of present illness. Otherwise, unchanged from my initial visit note.  Past Medical History:  Diagnosis Date   Chronic back pain 09/13/2013   Chronic pain syndrome 09/13/2013   GERD (gastroesophageal reflux disease)    hx. of   Gout 09/13/2013   Hemorrhoids    Hypertension    Morbid obesity with body mass index of 45.0-49.9 in adult [facility]) 09/13/2013   Osteoarthritis 09/13/2013   Pneumonia    hx. of   Seizures (HCC)    Sleep apnea    has never used C-pap machine due to insurance cost   Type 1 diabetes mellitus, uncontrolled    Past Surgical History:  Procedure Laterality Date   broken toe 35 years ago     TOTAL KNEE ARTHROPLASTY Right 05/01/2015   Procedure: RIGHT TOTAL KNEE ARTHROPLASTY;  Surgeon: Kathryne Hitch, MD;  Location: [facility];  Service: Orthopedics;  Laterality: Right;   WISDOM TOOTH EXTRACTION     Social History   Socioeconomic History   Marital status: Married    Spouse name: Not on file   Number of children: Not on file   Years of education: Not on file   Highest education level: Not on file  Occupational History   Not on file  Tobacco Use   Smoking status: Former    Types: Cigarettes    Quit date: 03/08/2004    Years since quitting: 17.6   Smokeless tobacco: Never  Substance and Sexual Activity   Alcohol use: No    Comment: rare   Drug use: No   Sexual activity: Yes    Partners: Female  Other Topics Concern   Not on file  Social History Narrative   Psychiatric nurse for [employer]   Married   Social Determinants of Health   Financial Resource Strain: Not on file  Food Insecurity: Not on file  Transportation Needs: Not on file  Physical Activity: Not on file  Stress: Not on file  Social Connections: Not on file  Intimate Partner Violence: Not on file   Current Outpatient Medications on File Prior to Visit  Medication Sig Dispense Refill   ARTHROTEC 75-0.2 MG TBEC 1 tab p.o. twice daily, branded Arthrotec please 60  tablet 11   aspirin 81 MG tablet Take 81 mg by mouth once.     bisoprolol (ZEBETA) 5 MG tablet Take 1 PO qd x 1 week, then 1/2 PO qd x 1 week, then 1/2 PO qod x 1 week, then stop 30 tablet 0   glipiZIDE (GLUCOTROL) 10 MG tablet TAKE 1 TABLET BY MOUTH  TWICE DAILY BEFORE MEALS 180 tablet  3   hydrochlorothiazide (HYDRODIURIL) 12.5 MG tablet TAKE 1/2 TO 1 TABLET BY MOUTH EVERY DAY AS NEEDED FOR EDEMA 90 tablet 2   Insulin Pen Needle (BD PEN NEEDLE NANO 2ND GEN) 32G X 4 MM MISC Use as directed 5 times a day with insulin injections 200 each 5   insulin regular human CONCENTRATED (HUMULIN R U-500 KWIKPEN) 500 UNIT/ML KwikPen Inject 200 units under skin a day as advised 12 mL 3   metFORMIN (GLUCOPHAGE) 1000 MG tablet TAKE 1 TABLET BY MOUTH  TWICE DAILY WITH A MEAL 180 tablet 3   montelukast (SINGULAIR) 10 MG tablet TAKE 1 TABLET BY MOUTH AT BEDTIME AS NEEDED. 90 tablet 2   TRULICITY 3 MG/0.5ML SOPN INJECT 3 MG INTO THE SKIN ONCE A WEEK. 6 mL 3   No current facility-administered medications on file prior to visit.   Allergies  Allergen Reactions   Penicillins     REACTION: swelling, rash   Family History  Problem Relation Age of Onset   Alcohol abuse Father    Arthritis Maternal Grandmother    Alcohol abuse Paternal Grandmother    Alcohol abuse Paternal Grandfather    Diabetes Maternal Aunt     PE: BP 118/80 (BP Location: Right Arm, Patient Position: Sitting, Cuff Size: Normal)    Pulse 94    Ht 5' 6.5" (1.689 m)    Wt (!) 348 lb 6.4 oz (158 kg)    SpO2 96%    BMI 55.39 kg/m     Wt Readings from Last 3 Encounters:  11/02/21 (!) 348 lb 6.4 oz (158 kg)  09/14/21 (!) 333 lb 6.4 oz (151.2 kg)  08/03/21 (!) 340 lb (154.2 kg)   Constitutional: overweight, in NAD Eyes: PERRLA, EOMI, no exophthalmos ENT: moist mucous membranes, no thyromegaly, no cervical lymphadenopathy Cardiovascular: RRR, No MRG, + LE periankle edema L>R Respiratory: CTA B Musculoskeletal: no deformities, strength intact  in all 4 Skin: moist, warm, + stasis dermatitis rash bilateral lower legs Neurological: no tremor with outstretched hands, DTR normal in all 4  ASSESSMENT: 1. DM2, insulin-dependent, uncontrolled, with complications - PN - stable, worse in L foot - DR  2. PN  3.  Obesity class III  PLAN:  1. Patient with history of uncontrolled type 2 diabetes, on a complex medication regimen including oral medications (metformin, sulfonylurea), weekly GLP-1 receptor agonist, and previously basal-bolus insulin regimen, which we changed to U500 insulin at last visit.  At that time, an HbA1c was higher, at 8.4%.  He was planning to change insurances at that time.   -At this visit, he mentions that his sugars improved significantly after starting U500 insulin.  He even has blood sugars in the 70s now.  He had 1 low blood sugar in the 60s in the morning after he took a higher dose of insulin before dinner.  The doses of U500 insulin that we started at last visit worked well for him, but before he had a low blood sugar, he took 100 units before dinner rather than the 60 units recommended.  I advised him that if he has to go up on the doses to only go to 70 or 80 units before dinner.  However, the recommended doses are still only starting points and he can vary them as needed, especially now that he got used to this insulin. -She was not able to obtain Trulicity for the last 3 weeks from [facility].  At this visit, he wanted me to call it in  to [facility] >> done.  I did advise him that he may need to decrease the doses of insulin after starting back on Trulicity. - I advised him to: Patient Instructions  Please continue: - Metformin 2000 mg daily with dinner  - Glipizide 10 mg 2x a day  - U500 30 min before meals: 100 units before b'fast 60 units before dinner  Please add back: - Trulicity 3 mg weekly  Please continue to work on [employer].  Please come back for a follow-up appointment in 2 months.   - advised to  check sugars at different times of the day - 4x a day, rotating check times - advised for yearly eye exams >> he is UTD - return to clinic in 2 months  2. HL -Reviewed latest lipid panel from 05/2021: LDL slightly above target, HDL low:: Lab Results  Component Value Date   CHOL 142 05/14/2021   HDL 32.30 (L) 05/14/2021   LDLCALC 84 05/14/2021   TRIG 131.0 05/14/2021   CHOLHDL 4 05/14/2021  -He did not tolerate statins due to leg cramps  3.  Obesity class III -He did see nutrition in 10/2019 -We will continue Trulicity which should also help with weight loss, but unfortunately we need to use U500 insulin which can contribute to weight gain -Lost 5 pounds before last visit and 15 pounds before the previous visit -He gained 15 pounds since last visit  Carlus Pavlov, MD PhD [facility]

## 2021-11-08 ENCOUNTER — Telehealth: Payer: Self-pay

## 2021-11-08 ENCOUNTER — Other Ambulatory Visit (HOSPITAL_COMMUNITY): Payer: Self-pay

## 2021-11-08 NOTE — Telephone Encounter (Signed)
No PA is needed for the Humulin R 500unit Kwikpen.  Copay is $200 for 48ml/60 day supply.

## 2021-11-26 ENCOUNTER — Ambulatory Visit: Payer: No Typology Code available for payment source | Admitting: Sports Medicine

## 2021-11-29 ENCOUNTER — Encounter: Payer: Self-pay | Admitting: Sports Medicine

## 2021-11-29 ENCOUNTER — Ambulatory Visit (INDEPENDENT_AMBULATORY_CARE_PROVIDER_SITE_OTHER): Payer: No Typology Code available for payment source | Admitting: Sports Medicine

## 2021-11-29 DIAGNOSIS — Z0000 Encounter for general adult medical examination without abnormal findings: Secondary | ICD-10-CM

## 2021-11-29 NOTE — Patient Instructions (Addendum)
MEDICARE ANNUAL WELLNESS VISIT Health Maintenance Summary and Written Plan of Care  Christian [name] ,  Thank you for allowing me to perform your Medicare Annual Wellness Visit and for your ongoing commitment to your health.   Health Maintenance & Immunization History Health Maintenance  Topic Date Due   OPHTHALMOLOGY EXAM  11/29/2021 (Originally 06/05/2018)   COVID-19 Vaccine (4 - Booster for Moderna series) 12/15/2021 (Originally 12/23/2020)   Zoster Vaccines- Shingrix (1 of 2) 02/27/2022 (Originally 10/12/1970)   Pneumonia Vaccine 6365+ Years old (1 - PCV) 11/29/2022 (Originally 10/12/1957)   COLONOSCOPY (Pts 45-3020yrs Insurance coverage will need to be confirmed)  11/29/2022 (Originally 01/16/2020)   TETANUS/TDAP  11/29/2022 (Originally 10/12/1970)   HEMOGLOBIN A1C  03/14/2022   URINE MICROALBUMIN  05/14/2022   FOOT EXAM  06/01/2022   Hepatitis C Screening  Completed   HPV VACCINES  Aged Out   INFLUENZA VACCINE  Discontinued   Immunization History  Administered Date(s) Administered   Moderna Sars-Covid-2 Vaccination 12/03/2019, 12/31/2019, 10/28/2020   PPD Test 05/06/2015    These are the patient goals that we discussed:  Goals Addressed              This Visit's Progress     Patient Stated (pt-stated)        Patient states that he would like to loose 20 lbs.        This is a list of Health Maintenance Items that are overdue or due now: Pneumococcal vaccine  Td vaccine Colorectal cancer screening Shingrix vaccine Eye exam    Orders/Referrals Placed Today: No orders of the defined types were placed in this encounter.  (Contact our referral department at [phone] if you have not spoken with someone about your referral appointment within the next 5 days)    Follow-up Plan Follow-up with Monica Bectonhekkekandam, Thomas J, MD as planned Schedule your shingles and tetanus vaccine at your pharmacy. Please send in your cologuard at your convenience.  Medicare  wellness visit in one year.  Patient will access AVS on my chart.     Health Maintenance, Male Adopting a healthy lifestyle and getting preventive care are important in promoting health and wellness. Ask your health care provider about: The right schedule for you to have regular tests and exams. Things you can do on your own to prevent diseases and keep yourself healthy. What should I know about diet, weight, and exercise? Eat a healthy diet  Eat a diet that includes plenty of vegetables, fruits, low-fat dairy products, and lean protein. Do not eat a lot of foods that are high in solid fats, added sugars, or sodium. Maintain a healthy weight Body mass index (BMI) is a measurement that can be used to identify possible weight problems. It estimates body fat based on height and weight. Your health care provider can help determine your BMI and help you achieve or maintain a healthy weight. Get regular exercise Get regular exercise. This is one of the most important things you can do for your health. Most adults should: Exercise for at least 150 minutes each week. The exercise should increase your heart rate and make you sweat (moderate-intensity exercise). Do strengthening exercises at least twice a week. This is in addition to the moderate-intensity exercise. Spend less time sitting. Even light physical activity can be beneficial. Watch cholesterol and blood lipids Have your blood tested for lipids and cholesterol at 72 years of age, then have this test every 5 years. You may need to have your cholesterol levels  checked more often if: Your lipid or cholesterol levels are high. You are older than 72 years of age. You are at high risk for heart disease. What should I know about cancer screening? Many types of cancers can be detected early and may often be prevented. Depending on your health history and family history, you may need to have cancer screening at various ages. This may include  screening for: Colorectal cancer. Prostate cancer. Skin cancer. Lung cancer. What should I know about heart disease, diabetes, and high blood pressure? Blood pressure and heart disease High blood pressure causes heart disease and increases the risk of stroke. This is more likely to develop in people who have high blood pressure readings or are overweight. Talk with your health care provider about your target blood pressure readings. Have your blood pressure checked: Every 3-5 years if you are 57-43 years of age. Every year if you are 25 years old or older. If you are between the ages of 79 and 45 and are a current or former smoker, ask your health care provider if you should have a one-time screening for abdominal aortic aneurysm (AAA). Diabetes Have regular diabetes screenings. This checks your fasting blood sugar level. Have the screening done: Once every three years after age 35 if you are at a normal weight and have a low risk for diabetes. More often and at a younger age if you are overweight or have a high risk for diabetes. What should I know about preventing infection? Hepatitis B If you have a higher risk for hepatitis B, you should be screened for this virus. Talk with your health care provider to find out if you are at risk for hepatitis B infection. Hepatitis C Blood testing is recommended for: Everyone born from [birth year] through 1966. Anyone with known risk factors for hepatitis C. Sexually transmitted infections (STIs) You should be screened each year for STIs, including gonorrhea and chlamydia, if: You are sexually active and are younger than 72 years of age. You are older than 72 years of age and your health care provider tells you that you are at risk for this type of infection. Your sexual activity has changed since you were last screened, and you are at increased risk for chlamydia or gonorrhea. Ask your health care provider if you are at risk. Ask your health care  provider about whether you are at high risk for HIV. Your health care provider may recommend a prescription medicine to help prevent HIV infection. If you choose to take medicine to prevent HIV, you should first get tested for HIV. You should then be tested every 3 months for as long as you are taking the medicine. Follow these instructions at home: Alcohol use Do not drink alcohol if your health care provider tells you not to drink. If you drink alcohol: Limit how much you have to 0-2 drinks a day. Know how much alcohol is in your drink. In the [place] one drink equals one 12 oz bottle of beer (355 mL), one 5 oz glass of wine (148 mL), or one 1 oz glass of hard liquor (44 mL). Lifestyle Do not use any products that contain nicotine or tobacco. These products include cigarettes, chewing tobacco, and vaping devices, such as e-cigarettes. If you need help quitting, ask your health care provider. Do not use street drugs. Do not share needles. Ask your health care provider for help if you need support or information about quitting drugs. General instructions Schedule regular health, dental,  and eye exams. Stay current with your vaccines. Tell your health care provider if: You often feel depressed. You have ever been abused or do not feel safe at home. Summary Adopting a healthy lifestyle and getting preventive care are important in promoting health and wellness. Follow your health care provider's instructions about healthy diet, exercising, and getting tested or screened for diseases. Follow your health care provider's instructions on monitoring your cholesterol and blood pressure. This information is not intended to replace advice given to you by your health care provider. Make sure you discuss any questions you have with your health care provider. Document Revised: 03/15/2021 Document Reviewed: 03/15/2021 Elsevier Patient Education  2022 Elsevier Inc[employer]  Health Maintenance, Male Adopting a  healthy lifestyle and getting preventive care are important in promoting health and wellness. Ask your health care provider about: The right schedule for you to have regular tests and exams. Things you can do on your own to prevent diseases and keep yourself healthy. What should I know about diet, weight, and exercise? Eat a healthy diet  Eat a diet that includes plenty of vegetables, fruits, low-fat dairy products, and lean protein. Do not eat a lot of foods that are high in solid fats, added sugars, or sodium. Maintain a healthy weight Body mass index (BMI) is a measurement that can be used to identify possible weight problems. It estimates body fat based on height and weight. Your health care provider can help determine your BMI and help you achieve or maintain a healthy weight. Get regular exercise Get regular exercise. This is one of the most important things you can do for your health. Most adults should: Exercise for at least 150 minutes each week. The exercise should increase your heart rate and make you sweat (moderate-intensity exercise). Do strengthening exercises at least twice a week. This is in addition to the moderate-intensity exercise. Spend less time sitting. Even light physical activity can be beneficial. Watch cholesterol and blood lipids Have your blood tested for lipids and cholesterol at 72 years of age, then have this test every 5 years. You may need to have your cholesterol levels checked more often if: Your lipid or cholesterol levels are high. You are older than 72 years of age. You are at high risk for heart disease. What should I know about cancer screening? Many types of cancers can be detected early and may often be prevented. Depending on your health history and family history, you may need to have cancer screening at various ages. This may include screening for: Colorectal cancer. Prostate cancer. Skin cancer. Lung cancer. What should I know about heart  disease, diabetes, and high blood pressure? Blood pressure and heart disease High blood pressure causes heart disease and increases the risk of stroke. This is more likely to develop in people who have high blood pressure readings or are overweight. Talk with your health care provider about your target blood pressure readings. Have your blood pressure checked: Every 3-5 years if you are 3218-72 years of age. Every year if you are 72 years old or older. If you are between the ages of 265 and 1475 and are a current or former smoker, ask your health care provider if you should have a one-time screening for abdominal aortic aneurysm (AAA). Diabetes Have regular diabetes screenings. This checks your fasting blood sugar level. Have the screening done: Once every three years after age 72 if you are at a normal weight and have a low risk for diabetes. More  often and at a younger age if you are overweight or have a high risk for diabetes. What should I know about preventing infection? Hepatitis B If you have a higher risk for hepatitis B, you should be screened for this virus. Talk with your health care provider to find out if you are at risk for hepatitis B infection. Hepatitis C Blood testing is recommended for: Everyone born from [birth year] through 1966. Anyone with known risk factors for hepatitis C. Sexually transmitted infections (STIs) You should be screened each year for STIs, including gonorrhea and chlamydia, if: You are sexually active and are younger than 72 years of age. You are older than 72 years of age and your health care provider tells you that you are at risk for this type of infection. Your sexual activity has changed since you were last screened, and you are at increased risk for chlamydia or gonorrhea. Ask your health care provider if you are at risk. Ask your health care provider about whether you are at high risk for HIV. Your health care provider may recommend a prescription medicine to  help prevent HIV infection. If you choose to take medicine to prevent HIV, you should first get tested for HIV. You should then be tested every 3 months for as long as you are taking the medicine. Follow these instructions at home: Alcohol use Do not drink alcohol if your health care provider tells you not to drink. If you drink alcohol: Limit how much you have to 0-2 drinks a day. Know how much alcohol is in your drink. In the [place] one drink equals one 12 oz bottle of beer (355 mL), one 5 oz glass of wine (148 mL), or one 1 oz glass of hard liquor (44 mL). Lifestyle Do not use any products that contain nicotine or tobacco. These products include cigarettes, chewing tobacco, and vaping devices, such as e-cigarettes. If you need help quitting, ask your health care provider. Do not use street drugs. Do not share needles. Ask your health care provider for help if you need support or information about quitting drugs. General instructions Schedule regular health, dental, and eye exams. Stay current with your vaccines. Tell your health care provider if: You often feel depressed. You have ever been abused or do not feel safe at home. Summary Adopting a healthy lifestyle and getting preventive care are important in promoting health and wellness. Follow your health care provider's instructions about healthy diet, exercising, and getting tested or screened for diseases. Follow your health care provider's instructions on monitoring your cholesterol and blood pressure. This information is not intended to replace advice given to you by your health care provider. Make sure you discuss any questions you have with your health care provider. Document Revised: 03/15/2021 Document Reviewed: 03/15/2021 Elsevier Patient Education  2022 [employer].

## 2021-11-29 NOTE — Progress Notes (Signed)
MEDICARE ANNUAL WELLNESS VISIT  11/29/2021  Telephone Visit Disclaimer This Medicare AWV was conducted by telephone due to national recommendations for restrictions regarding the COVID-19 Pandemic (e.g. social distancing).  I verified, using two identifiers, that I am speaking with Christian [name] or their authorized healthcare agent. I discussed the limitations, risks, security, and privacy concerns of performing an evaluation and management service by telephone and the potential availability of an in-person appointment in the future. The patient expressed understanding and agreed to proceed.  Location of Patient: Home Location of Provider (nurse):  In the office.  Subjective:    Christian [name] is a 71 y.o. male patient of Thekkekandam, Gwen Her, MD who had a Medicare Annual Wellness Visit today via telephone. Christian [name] is Retired and lives with their spouse. he has 3 children. he reports that he is socially active and does interact with friends/family regularly. he is minimally physically active and enjoys photography and computers.  Patient Care Team: Silverio Decamp, MD as PCP - General (Sports Medicine)  Advanced Directives 11/29/2021 06/01/2021 10/25/2019 08/31/2017 05/26/2015 05/01/2015 05/01/2015  Does Patient Have a Medical Advance Directive? No No No No No No No  Does patient want to make changes to medical advance directive? - - - - - - -  Would patient like information on creating a medical advance directive? No - Patient declined Yes (MAU/Ambulatory/Procedural Areas - Information given) Yes (MAU/Ambulatory/Procedural Areas - Information given) Yes (MAU/Ambulatory/Procedural Areas - Information given) Yes - Educational materials given No - patient declined information No - patient declined information    Hospital Utilization Over the Past 12 Months: # of hospitalizations or ER visits: 0 # of surgeries: 0  Review of Systems    Patient reports that his overall health  is better compared to last year.  History obtained from chart review and the patient  Patient Reported Readings (BP, Pulse, CBG, Weight, etc) none  Pain Assessment Pain : 0-10 Pain Score: 6  Pain Type: Acute pain Pain Location: Arm Pain Orientation: Right Pain Descriptors / Indicators: Jabbing Pain Onset: More than a month ago Pain Frequency: Intermittent Pain Relieving Factors: none  Pain Relieving Factors: none  Current Medications & Allergies (verified) Allergies as of 11/29/2021       Reactions   Penicillins    REACTION: swelling, rash        Medication List        Accurate as of November 29, 2021 10:10 AM. If you have any questions, ask your nurse or doctor.          Arthrotec 75-0.2 MG Tbec Generic drug: Diclofenac-miSOPROStol 1 tab p.o. twice daily, branded Arthrotec please   aspirin 81 MG tablet Take 81 mg by mouth once.   BD Pen Needle Nano 2nd Gen 32G X 4 MM Misc Generic drug: Insulin Pen Needle Use as directed 5 times a day with insulin injections   bisoprolol 5 MG tablet Commonly known as: ZEBETA Take 1 PO qd x 1 week, then 1/2 PO qd x 1 week, then 1/2 PO qod x 1 week, then stop   glipiZIDE 10 MG tablet Commonly known as: GLUCOTROL TAKE 1 TABLET BY MOUTH  TWICE DAILY BEFORE MEALS   HumuLIN R U-500 KwikPen 500 UNIT/ML KwikPen Generic drug: insulin regular human CONCENTRATED Inject 200 units under skin a day as advised   hydrochlorothiazide 12.5 MG tablet Commonly known as: HYDRODIURIL TAKE 1/2 TO 1 TABLET BY MOUTH EVERY DAY AS NEEDED FOR EDEMA  metFORMIN 1000 MG tablet Commonly known as: GLUCOPHAGE TAKE 1 TABLET BY MOUTH  TWICE DAILY WITH A MEAL   montelukast 10 MG tablet Commonly known as: SINGULAIR TAKE 1 TABLET BY MOUTH AT BEDTIME AS NEEDED.   Trulicity 3 0000000 Sopn Generic drug: Dulaglutide Inject 3 mg into the skin once a week.        History (reviewed): Past Medical History:  Diagnosis Date   Chronic back pain  09/13/2013   Chronic pain syndrome 09/13/2013   GERD (gastroesophageal reflux disease)    hx. of   Gout 09/13/2013   Hemorrhoids    Hypertension    Morbid obesity with body mass index of 45.0-49.9 in adult [facility]) 09/13/2013   Osteoarthritis 09/13/2013   Pneumonia    hx. of   Seizures ([place])    Sleep apnea    has never used C-pap machine due to insurance cost   Type 1 diabetes mellitus, uncontrolled    Past Surgical History:  Procedure Laterality Date   broken toe 35 years ago     TOTAL KNEE ARTHROPLASTY Right 05/01/2015   Procedure: RIGHT TOTAL KNEE ARTHROPLASTY;  Surgeon: Mcarthur Rossetti, MD;  Location: [facility];  Service: Orthopedics;  Laterality: Right;   WISDOM TOOTH EXTRACTION     Family History  Problem Relation Age of Onset   Alcohol abuse Father    Arthritis Maternal Grandmother    Alcohol abuse Paternal Grandmother    Alcohol abuse Paternal Grandfather    Diabetes Maternal Aunt    Social History   Socioeconomic History   Marital status: Married    Spouse name: Lelan Pons   Number of children: 3   Years of education: 14   Highest education level: Some college, no degree  Occupational History   Occupation: Retired  Tobacco Use   Smoking status: Former    Types: Cigarettes    Quit date: 03/08/2004    Years since quitting: 17.7   Smokeless tobacco: Never  Substance and Sexual Activity   Alcohol use: No    Comment: rare   Drug use: No   Sexual activity: Yes    Partners: Female  Other Topics Concern   Not on file  Social History Narrative   Lives with wife. He has three children. He enjoys Product manager.   Social Determinants of Health   Financial Resource Strain: Low Risk    Difficulty of Paying Living Expenses: Not hard at all  Food Insecurity: No Food Insecurity   Worried About Charity fundraiser in the Last Year: Never true   [place] in the Last Year: Never true  Transportation Needs: No Transportation Needs   Lack of  Transportation (Medical): No   Lack of Transportation (Non-Medical): No  Physical Activity: Inactive   Days of Exercise per Week: 0 days   Minutes of Exercise per Session: 0 min  Stress: No Stress Concern Present   Feeling of Stress : Not at all  Social Connections: Moderately Integrated   Frequency of Communication with Friends and Family: Twice a week   Frequency of Social Gatherings with Friends and Family: More than three times a week   Attends Religious Services: Never   Marine scientist or Organizations: Yes   Attends Music therapist: More than 4 times per year   Marital Status: Married    Activities of Daily Living In your present state of health, do you have any difficulty performing the following activities: 11/29/2021  Hearing? N  Vision? N  Difficulty concentrating or making decisions? N  Walking or climbing stairs? Y  Comment Due to arthritis in knees.  Dressing or bathing? Y  Comment difficulty getting out of tub at times; his wife helps with that.  Doing errands, shopping? N  Preparing Food and eating ? N  Using the Toilet? N  In the past six months, have you accidently leaked urine? Y  Comment leaked urine due to urgency  Do you have problems with loss of bowel control? N  Managing your Medications? N  Managing your Finances? N  Housekeeping or managing your Housekeeping? N  Some recent data might be hidden    Patient Education/ Literacy How often do you need to have someone help you when you read instructions, pamphlets, or other written materials from your doctor or pharmacy?: 1 - Never What is the last grade level you completed in school?: Two years of college with certificate  Exercise Current Exercise Habits: The patient does not participate in regular exercise at present, Exercise limited by: orthopedic condition(s) (arthritis)  Diet Patient reports consuming 2 meals a day and 2 snack(s) a day Patient reports that his primary diet  is: Regular Patient reports that she does have regular access to food.   Depression Screen PHQ 2/9 Scores 11/29/2021 06/01/2021 10/25/2019 08/31/2017 12/21/2015  PHQ - 2 Score 0 0 0 0 0  PHQ- 9 Score - 0 - 0 -     Fall Risk Fall Risk  11/29/2021 06/01/2021 10/27/2019 10/25/2019 08/31/2017  Falls in the past year? 1 1 0 0 No  Number falls in past yr: 1 1 - - -  Injury with Fall? 0 0 - - -  Risk for fall due to : History of fall(s) Impaired balance/gait - - -  Follow up Falls evaluation completed;Education provided;Falls prevention discussed Education provided - - -     Objective:  Christian [name] seemed alert and oriented and he participated appropriately during our telephone visit.  Blood Pressure Weight BMI  BP Readings from Last 3 Encounters:  11/02/21 118/80  09/14/21 140/84  05/14/21 120/82   Wt Readings from Last 3 Encounters:  11/02/21 (!) 348 lb 6.4 oz (158 kg)  09/14/21 (!) 333 lb 6.4 oz (151.2 kg)  08/03/21 (!) 340 lb (154.2 kg)   BMI Readings from Last 1 Encounters:  11/02/21 55.39 kg/m    *Unable to obtain current vital signs, weight, and BMI due to telephone visit type  Hearing/Vision  Iona Beard did not seem to have difficulty with hearing/understanding during the telephone conversation Reports that he has not had a formal eye exam by an eye care professional within the past year Reports that he has not had a formal hearing evaluation within the past year *Unable to fully assess hearing and vision during telephone visit type  Cognitive Function: 6CIT Screen 11/29/2021  What Year? 0 points  What month? 0 points  What time? 0 points  Count back from 20 0 points  Months in reverse 0 points  Repeat phrase 0 points  Total Score 0   (Normal:0-7, Significant for Dysfunction: >8)  Normal Cognitive Function Screening: Yes   Immunization & Health Maintenance Record Immunization History  Administered Date(s) Administered   Moderna Sars-Covid-2 Vaccination  12/03/2019, 12/31/2019, 10/28/2020   PPD Test 05/06/2015    Health Maintenance  Topic Date Due   OPHTHALMOLOGY EXAM  11/29/2021 (Originally 06/05/2018)   COVID-19 Vaccine (4 - Booster for Moderna series) 12/15/2021 (Originally 12/23/2020)  Zoster Vaccines- Shingrix (1 of 2) 02/27/2022 (Originally 10/12/1970)   Pneumonia Vaccine 35+ Years old (1 - PCV) 11/29/2022 (Originally 10/12/1957)   COLONOSCOPY (Pts 45-15yrs Insurance coverage will need to be confirmed)  11/29/2022 (Originally 01/16/2020)   TETANUS/TDAP  11/29/2022 (Originally 10[birth year]12/1970)   HEMOGLOBIN A1C  03/14/2022   URINE MICROALBUMIN  05/14/2022   FOOT EXAM  06/01/2022   Hepatitis C Screening  Completed   HPV VACCINES  Aged Out   INFLUENZA VACCINE  Discontinued       Assessment  This is a routine wellness examination for YAGO URREGO.  Health Maintenance: Due or Overdue There are no preventive care reminders to display for this patient.   Christian [name] does not need a referral for Community Assistance: Care Management:   no Social Work:    no Prescription Assistance:  no Nutrition/Diabetes Education:  no   Plan:  Personalized Goals  Goals Addressed               This Visit's Progress     Patient Stated (pt-stated)        Patient states that he would like to loose 20 lbs.       Personalized Health Maintenance & Screening Recommendations  Pneumococcal vaccine  Td vaccine Colorectal cancer screening Shingrix vaccine Eye exam  Lung Cancer Screening Recommended: no (Low Dose CT Chest recommended if Age 84-80 years, 30 pack-year currently smoking OR have quit w/in past 15 years) Hepatitis C Screening recommended: no HIV Screening recommended: no  Advanced Directives: Written information was not prepared per patient's request.  Referrals & Orders No orders of the defined types were placed in this encounter.   Follow-up Plan Follow-up with Silverio Decamp, MD as planned Schedule your  shingles and tetanus vaccine at your pharmacy. Please send in your cologuard at your convenience.  Medicare wellness visit in one year.  Patient will access AVS on my chart.   I have personally reviewed and noted the following in the patients chart:   Medical and social history Use of alcohol, tobacco or illicit drugs  Current medications and supplements Functional ability and status Nutritional status Physical activity Advanced directives List of other physicians Hospitalizations, surgeries, and ER visits in previous 12 months Vitals Screenings to include cognitive, depression, and falls Referrals and appointments  In addition, I have reviewed and discussed with Christian [name] certain preventive protocols, quality metrics, and best practice recommendations. A written personalized care plan for preventive services as well as general preventive health recommendations is available and can be mailed to the patient at his request.      Tinnie Gens, RN  11/29/2021

## 2021-12-09 ENCOUNTER — Encounter: Payer: Self-pay | Admitting: Physician Assistant

## 2021-12-09 ENCOUNTER — Other Ambulatory Visit: Payer: Self-pay

## 2021-12-09 ENCOUNTER — Ambulatory Visit (INDEPENDENT_AMBULATORY_CARE_PROVIDER_SITE_OTHER): Payer: Medicare Other | Admitting: Physician Assistant

## 2021-12-09 ENCOUNTER — Ambulatory Visit (INDEPENDENT_AMBULATORY_CARE_PROVIDER_SITE_OTHER): Payer: Medicare Other

## 2021-12-09 DIAGNOSIS — M79675 Pain in left toe(s): Secondary | ICD-10-CM

## 2021-12-09 DIAGNOSIS — S46211A Strain of muscle, fascia and tendon of other parts of biceps, right arm, initial encounter: Secondary | ICD-10-CM | POA: Diagnosis not present

## 2021-12-09 DIAGNOSIS — L97521 Non-pressure chronic ulcer of other part of left foot limited to breakdown of skin: Secondary | ICD-10-CM

## 2021-12-09 MED ORDER — DOXYCYCLINE HYCLATE 100 MG PO TABS: 100.0000 mg | ORAL_TABLET | Freq: Two times a day (BID) | ORAL | 0 refills | Status: AC

## 2021-12-09 NOTE — Progress Notes (Addendum)
Office Visit Note   Patient: Christian Christian [name]           Date of Birth: 1951-07-03           MRN: [id number] Visit Date: 12/09/2021              Requested by: Christian Becton, MD [address] [place],  [place] [address] PCP: Christian Becton, MD   Assessment & Plan: Visit Diagnoses:  1. Pain of left great toe   2. Rupture of right proximal biceps tendon, initial encounter   3. Skin ulcer of toe of left foot, limited to breakdown of skin (HCC)     Plan: Christian [name]-thickness debridement of the right great toe ulcer was performed today.  Patient tolerated this well.  This was performed using a 15 blade.  There was no purulence encountered.  He will begin soaking the foot in Dial soap soaks.  He will try in the foot completely and then applied Xeroform and a light dressing.  He is placed on doxycycline for the next 2 weeks.  He is also encouraged to begin using compression hose given the lower leg edema bilaterally and the early cellulitic changes involving the left lower extremity.  He will follow-up with [place] sooner than 2 weeks if he develops any drainage from the left great toe.  Questions were encouraged and answered.  In regards to the right proximal biceps tendon rupture patient on shoulder exercises.  Otherwise conservative management.  Follow-Up Instructions: Return in about 2 weeks (around 12/23/2021).   Orders:  Orders Placed This Encounter  Procedures   XR Toe Great Left   Meds ordered this encounter  Medications   doxycycline (VIBRA-TABS) 100 MG tablet    Sig: Take 1 tablet (100 mg total) by mouth 2 (two) times daily for 14 days.    Dispense:  28 tablet    Refill:  0      Procedures: No procedures performed   Clinical Data: No additional findings.   Subjective: Chief Complaint  Patient presents with   Right Shoulder - Pain   Left Leg - Edema, Wound Check    HPI Mr. Christian Christian [name] 71 year old male well-known to Dr. Magnus Ivan service comes in  today due to right shoulder pain and left leg redness.  He states he was picking up a heavy chair on 12/22 and heard a pop in his shoulder.  He has bruising down the shoulder and brings with him photos of this on his phone.  He states he is having problems with range of motion of the right shoulder at this point in time.  He is also having left leg redness and his left great toe has an area that is sore.  States soreness in the left great toe has been ongoing for the past 10 days.  He denies any fevers chills.  He is diabetic reports his hemoglobin A1c to be 5.6. Review of Systems See HPI  Objective: Vital Signs: There were no vitals taken for this visit.  Physical Exam General well-developed well-nourished male in no acute distress. Ortho Exam Right shoulder limited range of motion secondary to pain.  Obvious ecchymosis consistent with biceps tendon rupture.  Distal biceps tendons intact.  Distalization of the right biceps muscle and tenderness in the muscle bodies present.  Left leg early cellulitic changes anterior aspect of the leg no weeping of the lower leg.  +1 pitting edema.  Calf supple nontender.  Left foot great toe ulceration  less than 5 mm in width and length without drainage at the pad of the great toe.  Ulceration does not extend past the fat layer.  Specialty Comments:  No specialty comments available.  Imaging: XR Toe Great Left  Result Date: 12/09/2021 Left great toe 3 views: No acute fracture.  No evidence of osteomyelitis or bony abnormalities.    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 of   Gout 09/13/2013   Hemorrhoids    Hypertension    Morbid obesity with body mass index of 45.0-49.9 in adult [facility]) 09/13/2013   Osteoarthritis 09/13/2013   Pneumonia    hx. of   Seizures (HCC)    Sleep apnea    has never used C-pap machine due to insurance cost   Type 1 diabetes mellitus, uncontrolled     Family History  Problem Relation Age of Onset   Alcohol abuse Father    Arthritis Maternal Grandmother    Alcohol abuse Paternal Grandmother    Alcohol abuse Paternal Grandfather    Diabetes Maternal Aunt     Past Surgical History:  Procedure Laterality Date   broken toe 35 years ago     TOTAL KNEE ARTHROPLASTY Right 05/01/2015   Procedure: RIGHT TOTAL KNEE ARTHROPLASTY;  Surgeon: Kathryne Hitch, MD;  Location: [facility];  Service: Orthopedics;  Laterality: Right;   WISDOM TOOTH EXTRACTION     Social History   Occupational History   Occupation: Retired  Tobacco Use   Smoking status: Former    Types: Cigarettes    Quit date: 03/08/2004    Years since quitting: 17.7   Smokeless tobacco: Never  Substance and Sexual Activity   Alcohol use: No    Comment: rare   Drug use: No   Sexual activity: Yes    Partners: Female

## 2021-12-23 ENCOUNTER — Other Ambulatory Visit: Payer: Self-pay

## 2021-12-23 ENCOUNTER — Encounter: Payer: Self-pay | Admitting: Physician Assistant

## 2021-12-23 ENCOUNTER — Ambulatory Visit (INDEPENDENT_AMBULATORY_CARE_PROVIDER_SITE_OTHER): Payer: Medicare Other | Admitting: Physician Assistant

## 2021-12-23 DIAGNOSIS — L97521 Non-pressure chronic ulcer of other part of left foot limited to breakdown of skin: Secondary | ICD-10-CM

## 2021-12-23 DIAGNOSIS — M79675 Pain in left toe(s): Secondary | ICD-10-CM

## 2021-12-23 NOTE — Progress Notes (Signed)
HPI: Christian [name] returns today for follow-up of his left great toe ulcer and lower leg cellulitis.  He has been taking the doxycycline and applied Xeroform with a light dressing toe after soaking the toe in Dial soap soaks.  The overall he feels that the swelling in his legs is gone down and also a bit the redness is dissipating.  He has been taking hydrochlorothiazide periodically and notes that he is having some muscle cramps he is wondering if the muscle cramps may be due to his insulin.  He is not doing any type potassium replacement with the hydrochlorothiazide.  Physical exam: Left great toe ulceration almost completely healed.  There is significant callus about about the ulceration area.  No drainage no signs of infection.  Diminished edema left lower leg.  No erythema of the left lower leg today.  Impression: Left great toe ulceration  Plan: Recommend for the next 3 to 4 days he continue current care with a soaking foot in Dial soap soaks of dry in the area and then applied Xeroform and a bandage.  In regards to his muscle cramps he will talk to his primary care provider at his upcoming appointment in early March.  Of note I did offer him therapy for his right shoulder but he defers.  He will follow-up with [place] as needed.  Questions encouraged and answered.

## 2022-01-10 ENCOUNTER — Other Ambulatory Visit: Payer: Self-pay | Admitting: Internal Medicine

## 2022-01-17 ENCOUNTER — Ambulatory Visit (INDEPENDENT_AMBULATORY_CARE_PROVIDER_SITE_OTHER): Payer: Medicare Other | Admitting: Internal Medicine

## 2022-01-17 ENCOUNTER — Encounter: Payer: Self-pay | Admitting: Internal Medicine

## 2022-01-17 ENCOUNTER — Other Ambulatory Visit: Payer: Self-pay

## 2022-01-17 VITALS — BP 128/72 | HR 78 | Ht 66.5 in | Wt 352.8 lb

## 2022-01-17 DIAGNOSIS — E1165 Type 2 diabetes mellitus with hyperglycemia: Secondary | ICD-10-CM | POA: Diagnosis not present

## 2022-01-17 DIAGNOSIS — E1142 Type 2 diabetes mellitus with diabetic polyneuropathy: Secondary | ICD-10-CM | POA: Diagnosis not present

## 2022-01-17 DIAGNOSIS — E1169 Type 2 diabetes mellitus with other specified complication: Secondary | ICD-10-CM

## 2022-01-17 DIAGNOSIS — E785 Hyperlipidemia, unspecified: Secondary | ICD-10-CM

## 2022-01-17 DIAGNOSIS — Z6841 Body Mass Index (BMI) 40.0 and over, adult: Secondary | ICD-10-CM | POA: Diagnosis not present

## 2022-01-17 LAB — POCT GLYCOSYLATED HEMOGLOBIN (HGB A1C): Hemoglobin A1C: 7.1 % — AB (ref 4.0–5.6)

## 2022-01-17 MED ORDER — HUMULIN R U-500 KWIKPEN 500 UNIT/ML ~~LOC~~ SOPN: PEN_INJECTOR | SUBCUTANEOUS | 3 refills | Status: DC

## 2022-01-17 NOTE — Addendum Note (Signed)
Addended by: Lauralyn Primes on: 01/17/2022 04:28 PM ? ? Modules accepted: Orders ? ?

## 2022-01-17 NOTE — Progress Notes (Signed)
Patient ID: Christian [name], male   DOB: 1950-11-30, 71 y.o.   MRN: [id number]  This visit occurred during the SARS-CoV-2 public health emergency.  Safety protocols were in place, including screening questions prior to the visit, additional usage of staff PPE, and extensive cleaning of exam room while observing appropriate contact time as indicated for disinfecting solutions.   HPI Christian [name] is a 71 y.o.-year-old male, returning for f/u for DM2, dx 2009, insulin-dependent since 2014, uncontrolled, with complications (DR, Peripheral neuropathy). Last visit: 2.5 months ago. [name] [employer].  His wife retired and accompanies him today.   No PCP now after Dr. Prince Rome left. He is applying for [employer] benefits.  Interim history: No increased urination, blurry vision, nausea, chest pain. He has imbalance and walks with a cane. He had steroid injections before last visit.  Sugars were higher. He has muscle cramps and also a left great toe ulcer and lower leg cellulitis >> resolving.  He was on antibiotics doxycycline).  He is not on statins.  Reviewed HbA1c levels: Lab Results  Component Value Date   HGBA1C 8.4 (A) 09/14/2021   HGBA1C 7.7 (A) 05/14/2021   HGBA1C 7.5 (A) 01/12/2021   HGBA1C 7.8 (A) 09/08/2020   HGBA1C 7.0 (A) 05/14/2020   HGBA1C 7.5 (A) 01/09/2020   HGBA1C 6.9 (A) 09/09/2019   HGBA1C 7.6 (A) 05/08/2019   HGBA1C 8.2 (A) 01/08/2019   HGBA1C 7.7 (A) 09/25/2018   HGBA1C 7.4 (A) 05/25/2018   HGBA1C 9.0 02/22/2018   HGBA1C 9.1 11/23/2017   HGBA1C 9 08/24/2017   HGBA1C 8.4 05/25/2017   HGBA1C 8.8 02/06/2017   HGBA1C 8.8 11/08/2016   HGBA1C 7.3 08/03/2016   HGBA1C 7.7 05/03/2016   HGBA1C 7.6 01/27/2016  08/24/2017: HbA1c 9%  Pt is on: - Metformin 1000 mg 2x a daily >> 2000 mg with dinner - Glipizide 10 mg 2x a day  - Trulicity 1.5 >> 3 mg weekly >> restarted 09/2021 - U500 30 min before meals - started 09/2021: 100 units before b'fast 50 units before lunch 60 >> 20  units before dinner and 30 units before bedtime Stopped Jardiance 25 mg >> but too expensive - in the donut hole; had yeast infections. Glipizide and Invokana were started 03/2014.  He has been on Victoza in the past >> not efficient anymore.  Pt checks his sugars once a day: - am:  67, 72-148, 166, 243  >> 64, 72-156, 161, 209, 297 - 2h after b'fast: n/c >> 157 >> n/c >> 172 >> 120 - before lunch: 280 >> 125 >> 190-200 >> n/c >> 102 - 2h after lunch: 220-230 >> n/c >> 190-220 >> n/c - before dinner: n/c >> 200s >> n/c  >> 150-185 >> 164 - 2h after dinner: n/c  - bedtime: 230, 281 >> 180s, 333 >> n/c >> 200, 331 - nighttime: n/c >> 65 Lowest sugar was  85 >> 67 >> 64, he has hypoglycemia awareness in the 90s Highest sugar was 258 >> 243 >> 318 >> 331.  Meter: OneTouch Ultra Mini >> [name]  -No CKD, last BUN/creatinine:  Lab Results  Component Value Date   BUN 27 (H) 05/14/2021   CREATININE 1.25 05/14/2021  Not on ACE inhibitor/ARB.  Normal ACR: Lab Results  Component Value Date   MICRALBCREAT 1.1 05/14/2021   MICRALBCREAT 0.7 09/09/2019   MICRALBCREAT 1.1 08/31/2017   MICRALBCREAT 0.8 08/20/2015   MICRALBCREAT 0.3 09/11/2013   Normal GFR levels: Lab Results  Component Value Date  GFRNONAA 84 09/09/2019   GFRNONAA >60 05/02/2015   GFRNONAA >60 04/27/2015   -+ HL; last set of lipids: Lab Results  Component Value Date   CHOL 142 05/14/2021   HDL 32.30 (L) 05/14/2021   LDLCALC 84 05/14/2021   TRIG 131.0 05/14/2021   CHOLHDL 4 05/14/2021  No statins due to leg cramps.  - last eye exam was in 2022: Reportedly + DR  -+ numbness and tingling in his feet.  Up-to-date with foot exam - 05/2021.  He had R TKR in 04/2015.  He has steroid inj in L knee and L shoulder.  Also uses CBD oil liniment and this helps. She has OSA.  ROS: + See HPI + joint aches  I reviewed pt's medications, allergies, PMH, social hx, family hx, and changes were documented in the history of  present illness. Otherwise, unchanged from my initial visit note.  Past Medical History:  Diagnosis Date   Chronic back pain 09/13/2013   Chronic pain syndrome 09/13/2013   GERD (gastroesophageal reflux disease)    hx. of   Gout 09/13/2013   Hemorrhoids    Hypertension    Morbid obesity with body mass index of 45.0-49.9 in adult [facility]) 09/13/2013   Osteoarthritis 09/13/2013   Pneumonia    hx. of   Seizures (HCC)    Sleep apnea    has never used C-pap machine due to insurance cost   Type 1 diabetes mellitus, uncontrolled    Past Surgical History:  Procedure Laterality Date   broken toe 35 years ago     TOTAL KNEE ARTHROPLASTY Right 05/01/2015   Procedure: RIGHT TOTAL KNEE ARTHROPLASTY;  Surgeon: Kathryne Hitch, MD;  Location: [facility];  Service: Orthopedics;  Laterality: Right;   WISDOM TOOTH EXTRACTION     Social History   Socioeconomic History   Marital status: Married    Spouse name: Hilda Lias   Number of children: 3   Years of education: 14   Highest education level: Some college, no degree  Occupational History   Occupation: Retired  Tobacco Use   Smoking status: Former    Types: Cigarettes    Quit date: 03/08/2004    Years since quitting: 17.8   Smokeless tobacco: Never  Substance and Sexual Activity   Alcohol use: No    Comment: rare   Drug use: No   Sexual activity: Yes    Partners: Female  Other Topics Concern   Not on file  Social History Narrative   Lives with wife. He has three children. He enjoys Diplomatic Services operational officer.   Social Determinants of Health   Financial Resource Strain: Low Risk    Difficulty of Paying Living Expenses: Not hard at all  Food Insecurity: No Food Insecurity   Worried About Programme researcher, broadcasting/film/video in the Last Year: Never true   Ran Out of Food in the Last Year: Never true  Transportation Needs: No Transportation Needs   Lack of Transportation (Medical): No   Lack of Transportation (Non-Medical): No  Physical Activity:  Inactive   Days of Exercise per Week: 0 days   Minutes of Exercise per Session: 0 min  Stress: No Stress Concern Present   Feeling of Stress : Not at all  Social Connections: Moderately Integrated   Frequency of Communication with Friends and Family: Twice a week   Frequency of Social Gatherings with Friends and Family: More than three times a week   Attends Religious Services: Never   Active Member of  Clubs or Organizations: Yes   Attends Engineer, structural: More than 4 times per year   Marital Status: Married  Catering manager Violence: Not At Risk   Fear of Current or Ex-Partner: No   Emotionally Abused: No   Physically Abused: No   Sexually Abused: No   Current Outpatient Medications on File Prior to Visit  Medication Sig Dispense Refill   ARTHROTEC 75-0.2 MG TBEC 1 tab p.o. twice daily, branded Arthrotec please 60 tablet 11   aspirin 81 MG tablet Take 81 mg by mouth once.     bisoprolol (ZEBETA) 5 MG tablet Take 1 PO qd x 1 week, then 1/2 PO qd x 1 week, then 1/2 PO qod x 1 week, then stop (Patient not taking: Reported on 11/29/2021) 30 tablet 0   Dulaglutide (TRULICITY) 3 MG/0.5ML SOPN Inject 3 mg into the skin once a week. 6 mL 3   glipiZIDE (GLUCOTROL) 10 MG tablet TAKE 1 TABLET BY MOUTH  TWICE DAILY BEFORE MEALS 180 tablet 3   hydrochlorothiazide (HYDRODIURIL) 12.5 MG tablet TAKE 1/2 TO 1 TABLET BY MOUTH EVERY DAY AS NEEDED FOR EDEMA 90 tablet 2   Insulin Pen Needle (BD PEN NEEDLE NANO 2ND GEN) 32G X 4 MM MISC Use as directed 5 times a day with insulin injections 200 each 5   insulin regular human CONCENTRATED (HUMULIN R U-500 KWIKPEN) 500 UNIT/ML KwikPen Inject 200 units under skin a day as advised 24 mL 3   metFORMIN (GLUCOPHAGE) 1000 MG tablet TAKE 1 TABLET BY MOUTH  TWICE DAILY WITH A MEAL 180 tablet 3   montelukast (SINGULAIR) 10 MG tablet TAKE 1 TABLET BY MOUTH AT BEDTIME AS NEEDED. 90 tablet 2   No current facility-administered medications on file prior to  visit.   Allergies  Allergen Reactions   Penicillins     REACTION: swelling, rash   Family History  Problem Relation Age of Onset   Alcohol abuse Father    Arthritis Maternal Grandmother    Alcohol abuse Paternal Grandmother    Alcohol abuse Paternal Grandfather    Diabetes Maternal Aunt     PE: BP 128/72 (BP Location: Left Arm, Patient Position: Sitting, Cuff Size: Normal)    Pulse 78    Ht 5' 6.5" (1.689 m)    Wt (!) 352 lb 12.8 oz (160 kg)    SpO2 96%    BMI 56.09 kg/m     Wt Readings from Last 3 Encounters:  01/17/22 (!) 352 lb 12.8 oz (160 kg)  11/02/21 (!) 348 lb 6.4 oz (158 kg)  09/14/21 (!) 333 lb 6.4 oz (151.2 kg)   Constitutional: overweight, in NAD Eyes: PERRLA, EOMI, no exophthalmos ENT: moist mucous membranes, no thyromegaly, no cervical lymphadenopathy Cardiovascular: RRR, No MRG, + LE periankle edema L>R Respiratory: CTA B Musculoskeletal: no deformities, strength intact in all 4 Skin: moist, warm, + stasis dermatitis rash bilateral lower legs Neurological: no tremor with outstretched hands, DTR normal in all 4  ASSESSMENT: 1. DM2, insulin-dependent, uncontrolled, with complications - PN - stable, worse in L foot - DR  2. PN  3.  Obesity class III  PLAN:  1. Patient with history of uncontrolled type 2 diabetes, on a complex medication regimen including oral medications with metformin and sulfonylurea, also, concentrated insulin (U-500), weekly GLP-1 receptor agonist, with still poor control.  At last visit, sugars are higher and HbA1c was also higher, and he was out of Trulicity for 3 weeks due to availability.  We restarted this but did not change the rest of the regimen. -Latest HbA1c was higher, at 8.4% 4 months ago. -At today's visit, sugars are much better, although he mostly checks in the morning.  They appear to be mostly at goal, however, he occasionally has higher blood sugars when he forgets to take U-500 insulin at bedtime.  Upon questioning, he  takes a lower dose before dinner and then approximately 20 units at bedtime.  This is an unusual regimen, however, it appears to be working well for him.  He did have 1 instance in which sugars were in the 60s in the morning but otherwise, they are not low.  He did have an occasional higher blood sugar after eating out and I advised him to check more at bedtime, but for now, I advised him to continue the current regimen except to try to stop glipizide, which I do not feel is helping anymore. - I advised him to: Patient Instructions  Please continue: - Metformin 2000 mg with dinner - Trulicity 3 mg weekly  - U500 30 min before meals  100 units before b'fast 50 units before lunch 30-40 units before dinner and 20 units before bedtime  Try to stop Glipizide.  Please return in 3-4 months with your sugar log.   - we checked his HbA1c: 7.1% (lower) - advised to check sugars at different times of the day - 4x a day, rotating check times - advised for yearly eye exams >> he is UTD - return to clinic in 3-4 months  2. HL -Reviewed latest lipid panel from 05/2021: LDL above goal, HDL low: Lab Results  Component Value Date   CHOL 142 05/14/2021   HDL 32.30 (L) 05/14/2021   LDLCALC 84 05/14/2021   TRIG 131.0 05/14/2021   CHOLHDL 4 05/14/2021  -He did not tolerate statins due to leg cramps  3.  Obesity class III -He saw nutrition 10/2019 -At last visit, he gained 15 pounds since the previous visit. -At that time, we added back Trulicity, of which she was off for 3 weeks -He gained 4 pounds since last visit  Carlus Pavlovristina Tuan Tippin, MD PhD [facility]

## 2022-01-17 NOTE — Patient Instructions (Addendum)
Please continue: ?- Metformin 2000 mg with dinner ?- Trulicity 3 mg weekly  ?- U500 30 min before meals  ?100 units before b'fast ?50 units before lunch ?30-40 units before dinner and 20 units before bedtime ? ?Try to stop Glipizide. ? ?Please return in 3-4 months with your sugar log.  ? ?

## 2022-01-27 ENCOUNTER — Other Ambulatory Visit: Payer: Self-pay | Admitting: Physician Assistant

## 2022-01-27 ENCOUNTER — Telehealth: Payer: Self-pay | Admitting: Physician Assistant

## 2022-01-27 MED ORDER — DOXYCYCLINE HYCLATE 100 MG PO TABS: 100.0000 mg | ORAL_TABLET | Freq: Two times a day (BID) | ORAL | 0 refills | Status: AC

## 2022-01-27 NOTE — Telephone Encounter (Signed)
Patient's wife Hilda Lias called needing to get an appointment as soon as possible because patient is an ulcer on the bottom of his left great toe.  Hilda Lias also asked if a Rx for Doxycycline can be sent to the pharmacy until he can get an appointment.  The number to contact Hilda Lias is [phone].  ?

## 2022-01-28 NOTE — Telephone Encounter (Signed)
I called Christian [name] and offered appointments for today. She states that she is unable to get him here in time and asked for appointment on Monday since you sent medication in.  Christian [name] had opening at 1030 Monday 3/27. Patient scheduled for then. ?

## 2022-01-31 ENCOUNTER — Ambulatory Visit (INDEPENDENT_AMBULATORY_CARE_PROVIDER_SITE_OTHER): Payer: Medicare Other | Admitting: Orthopaedic Surgery

## 2022-01-31 ENCOUNTER — Encounter: Payer: Self-pay | Admitting: Orthopaedic Surgery

## 2022-01-31 DIAGNOSIS — M79675 Pain in left toe(s): Secondary | ICD-10-CM

## 2022-01-31 NOTE — Progress Notes (Signed)
The patient is a 71 year old well-known to [place].  He has been dealing with a ulcer underneath his right great toe.  He is on antibiotics now and it does appear that the ulcer is healing.  He also has significant venous stasis changes in that leg.  He has known end-stage arthritis of the left knee.  He is wanting this knee replaced but he understands that we cannot proceed right now given the venous stasis changes in his left lower extremity combined with the cellulitis and ulcer. ? ?I would like him to get an appoint with Dr. Sharol Given to assess his left lower extremity in terms of venous stasis and the ulcer itself.  Fortunately it looks like it is trying to heal but I need Dr. Jess Barters expertise trying to get things to improve so we can consider knee replacement surgery at some point.  The next time he is seen he does need a weight and BMI calculation. ?

## 2022-02-02 ENCOUNTER — Ambulatory Visit: Payer: Medicare Other | Admitting: Physician Assistant

## 2022-02-07 ENCOUNTER — Ambulatory Visit (INDEPENDENT_AMBULATORY_CARE_PROVIDER_SITE_OTHER): Payer: Medicare Other | Admitting: Orthopedic Surgery

## 2022-02-07 DIAGNOSIS — L97521 Non-pressure chronic ulcer of other part of left foot limited to breakdown of skin: Secondary | ICD-10-CM

## 2022-02-08 ENCOUNTER — Encounter: Payer: Self-pay | Admitting: Orthopedic Surgery

## 2022-02-08 NOTE — Progress Notes (Signed)
? ?Office Visit Note ?  ?Patient: Christian [name]           ?Date of Birth: 01-13-1951           ?MRN: [id number] ?Visit Date: 02/07/2022 ?             ?Requested by: Silverio Decamp, MD ?[address] ?[address] ?[place],  [place] [address] ?PCP: Silverio Decamp, MD ? ?Chief Complaint  ?Patient presents with  ? Left Foot - Wound Check  ? Left Leg - Wound Check  ? ? ? ? ?HPI: ?Patient is a 71 year old gentleman who presents in follow-up for 2 separate issues #1 ulceration of the left great toe which started in January.  #2 venous stasis insufficiency left leg.  Patient states he is interested in pursuing a total knee arthroplasty with Dr. Ninfa Linden.  Patient is currently on doxycycline. ? ?Assessment & Plan: ?Visit Diagnoses:  ?1. Skin ulcer of toe of left foot, limited to breakdown of skin ([place])   ? ? ?Plan: Ulcer was debrided recommended pressure offloading Achilles stretching. ? ?Follow-Up Instructions: Return in about 4 weeks (around 03/07/2022).  ? ?Ortho Exam ? ?Patient is alert, oriented, no adenopathy, well-dressed, normal affect, normal respiratory effort. ?Examination patient has shortness of breath while seated he is diabetic he has a palpable dorsalis pedis pulse.  With his knee extended he does have Achilles contracture with dorsiflexion only to neutral patient also has hallux rigidus with dorsiflexion only to neutral as well.  Patient has venous stasis insufficiency of the left lower extremity with pitting edema there is no ulcers no cellulitis no drainage.  Patient has a Wagner grade 1 ulcer beneath the proximal phalanx of the left great toe.  After informed consent a 10 blade knife was used to debride the skin and soft tissue back to healthy viable bleeding tissue silver nitrate was used hemostasis.  After debridement the ulcer is 2 cm in diameter 1 mm deep.  There is no exposed bone or tendon. ? ?Imaging: ?No results found. ?No images are attached to the encounter. ? ?Labs: ?Lab Results   ?Component Value Date  ? HGBA1C 7.1 (A) 01/17/2022  ? HGBA1C 8.4 (A) 09/14/2021  ? HGBA1C 7.7 (A) 05/14/2021  ? ? ? ?Lab Results  ?Component Value Date  ? ALBUMIN 4.2 05/14/2021  ? ALBUMIN 3.9 08/31/2017  ? ALBUMIN 4.3 09/21/2016  ? ? ?No results found for: MG ?Lab Results  ?Component Value Date  ? VD25OH 14 (L) 01/02/2019  ? VD25OH 11 (L) 07/02/2018  ? ? ?No results found for: PREALBUMIN ? ?  Latest Ref Rng & Units 01/02/2019  ? 11:13 AM 07/02/2018  ? 11:58 AM 08/31/2017  ?  2:45 PM  ?CBC EXTENDED  ?WBC 3.8 - 10.8 Thousand/uL 11.2   10.2   10.7    ?RBC 4.20 - 5.80 Million/uL 4.03   4.05   4.05    ?Hemoglobin 13.2 - 17.1 g/dL 12.4   12.6   12.8    ?HCT 38.5 - 50.0 % 37.4   37.1   39.8    ?Platelets 140 - 400 Thousand/uL 249   238   225.0    ?NEUT# 1,500 - 7,800 cells/uL 7,896   7,007   7.1    ?Lymph# 850 - 3,900 cells/uL 2,150   2,060   2.3    ? ? ? ?There is no height or weight on file to calculate BMI. ? ?Orders:  ?No orders of the defined types  were placed in this encounter. ? ?No orders of the defined types were placed in this encounter. ? ? ? Procedures: ?No procedures performed ? ?Clinical Data: ?No additional findings. ? ?ROS: ? ?All other systems negative, except as noted in the HPI. ?Review of Systems ? ?Objective: ?Vital Signs: There were no vitals taken for this visit. ? ?Specialty Comments:  ?No specialty comments available. ? ?PMFS History: ?Patient Active Problem List  ? Diagnosis Date Noted  ? Impingement syndrome, shoulder, right 10/15/2021  ? Olecranon bursitis, left elbow 09/08/2021  ? Annual physical exam 06/01/2021  ? Polyneuropathy associated with underlying disease ([place]) 05/08/2019  ? Chronic left shoulder pain 01/02/2019  ? Hyperlipidemia associated with type 2 diabetes mellitus ([place]) 09/03/2017  ? Uncontrolled type 2 diabetes mellitus with peripheral neuropathy 01/27/2016  ? 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 Gout 09/13/2013  ? Chronic pain syndrome 09/13/2013  ? Primary  osteoarthritis of left knee 09/13/2013  ? Chronic back pain 09/13/2013  ? Morbid obesity with body mass index (BMI) of 50.0 to 59.9 in adult [facility]) 09/13/2013  ? Seizures ([place])   ? Hypertension   ? ?Past Medical History:  ?Diagnosis Date  ? Chronic back pain 09/13/2013  ? Chronic pain syndrome 09/13/2013  ? GERD (gastroesophageal reflux disease)   ? hx. of  ? Gout 09/13/2013  ? Hemorrhoids   ? Hypertension   ? Morbid obesity with body mass index of 45.0-49.9 in adult [facility]) 09/13/2013  ? Osteoarthritis 09/13/2013  ? Pneumonia   ? hx. of  ? Seizures ([place])   ? Sleep apnea   ? has never used C-pap machine due to insurance cost  ? Type 1 diabetes mellitus, uncontrolled   ?  ?Family History  ?Problem Relation Age of Onset  ? Alcohol abuse Father   ? Arthritis Maternal Grandmother   ? Alcohol abuse Paternal Grandmother   ? Alcohol abuse Paternal Grandfather   ? Diabetes Maternal Aunt   ?  ?Past Surgical History:  ?Procedure Laterality Date  ? broken toe 35 years ago    ? TOTAL KNEE ARTHROPLASTY Right 05/01/2015  ? Procedure: RIGHT TOTAL KNEE ARTHROPLASTY;  Surgeon: Mcarthur Rossetti, MD;  Location: [facility];  Service: Orthopedics;  Laterality: Right;  ? WISDOM TOOTH EXTRACTION    ? ?Social History  ? ?Occupational History  ? Occupation: Retired  ?Tobacco Use  ? Smoking status: Former  ?  Types: Cigarettes  ?  Quit date: 03/08/2004  ?  Years since quitting: 17.9  ? Smokeless tobacco: Never  ?Substance and Sexual Activity  ? Alcohol use: No  ?  Comment: rare  ? Drug use: No  ? Sexual activity: Yes  ?  Partners: Female  ? ? ? ? ? ?

## 2022-02-14 ENCOUNTER — Other Ambulatory Visit: Payer: Self-pay | Admitting: Internal Medicine

## 2022-03-07 ENCOUNTER — Ambulatory Visit (INDEPENDENT_AMBULATORY_CARE_PROVIDER_SITE_OTHER): Payer: Medicare Other | Admitting: Orthopedic Surgery

## 2022-03-07 DIAGNOSIS — L97521 Non-pressure chronic ulcer of other part of left foot limited to breakdown of skin: Secondary | ICD-10-CM

## 2022-03-07 DIAGNOSIS — G8929 Other chronic pain: Secondary | ICD-10-CM

## 2022-03-28 ENCOUNTER — Encounter: Payer: Self-pay | Admitting: Orthopedic Surgery

## 2022-03-28 DIAGNOSIS — M25562 Pain in left knee: Secondary | ICD-10-CM

## 2022-03-28 DIAGNOSIS — G8929 Other chronic pain: Secondary | ICD-10-CM | POA: Diagnosis not present

## 2022-03-28 MED ORDER — LIDOCAINE HCL (PF) 1 % IJ SOLN
5.0000 mL | INTRAMUSCULAR | Status: AC | PRN
  Administered 2022-03-28: 5 mL

## 2022-03-28 MED ORDER — METHYLPREDNISOLONE ACETATE 40 MG/ML IJ SUSP
40.0000 mg | INTRAMUSCULAR | Status: AC | PRN
  Administered 2022-03-28: 40 mg via INTRA_ARTICULAR

## 2022-03-28 NOTE — Progress Notes (Signed)
Office Visit Note   Patient: Christian [name]           Date of Birth: [birth year]           MRN: [id number] Visit Date: 03/07/2022              Requested by: Silverio Decamp, MD [address],  [place] [address] PCP: Silverio Decamp, MD  Chief Complaint  Patient presents with   Left Leg - Follow-up   Left Foot - Wound Check    GT ulcer      HPI: Patient is a 71 year old gentleman who presents with 3 separate issues.  #1 left great toe ulcer #2 venous insufficiency left leg #3 chronic osteoarthritis left knee.  Patient states his most recent hemoglobin A1c is 6.1.  Assessment & Plan: Visit Diagnoses:  1. Skin ulcer of toe of left foot, limited to breakdown of skin ([place])   2. Chronic pain of left knee     Plan: Recommended compression, exercise.  Follow-Up Instructions: Return if symptoms worsen or fail to improve.   Ortho Exam  Patient is alert, oriented, no adenopathy, well-dressed, normal affect, normal respiratory effort. Examination patient does have venous swelling but no ulcers in the foot or leg.  The great toe has no ulcers at this time.  Patient has pain and crepitation with range of motion of the left knee with range of motion 0 to 90 degrees.  Collaterals and cruciates are stable there is no effusion there is global tenderness in the patellofemoral joint as well as medial lateral joint line.  Imaging: No results found. No images are attached to the encounter.  Labs: Lab Results  Component Value Date   HGBA1C 7.1 (A) 01/17/2022   HGBA1C 8.4 (A) 09/14/2021   HGBA1C 7.7 (A) 05/14/2021     Lab Results  Component Value Date   ALBUMIN 4.2 05/14/2021   ALBUMIN 3.9 08/31/2017   ALBUMIN 4.3 09/21/2016    No results found for: MG Lab Results  Component Value Date   VD25OH 14 (L) 01/02/2019   VD25OH 11 (L) 07/02/2018    No results found for: PREALBUMIN    Latest Ref Rng & Units 01/02/2019   11:13 AM 07/02/2018    11:58 AM 08/31/2017    2:45 PM  CBC EXTENDED  WBC 3.8 - 10.8 Thousand/uL 11.2   10.2   10.7    RBC 4.20 - 5.80 Million/uL 4.03   4.05   4.05    Hemoglobin 13.2 - 17.1 g/dL 12.4   12.6   12.8    HCT 38.5 - 50.0 % 37.4   37.1   39.8    Platelets 140 - 400 Thousand/uL 249   238   225.0    NEUT# 1,500 - 7,800 cells/uL 7,896   7,007   7.1    Lymph# 850 - 3,900 cells/uL 2,150   2,060   2.3       There is no height or weight on file to calculate BMI.  Orders:  No orders of the defined types were placed in this encounter.  No orders of the defined types were placed in this encounter.    Procedures: Large Joint Inj: L knee on 03/28/2022 7:42 AM Indications: pain and diagnostic evaluation Details: 22 G 1.5 in needle, anteromedial approach  Arthrogram: No  Medications: 5 mL lidocaine (PF) 1 %; 40 mg methylPREDNISolone acetate 40 MG/ML Outcome: tolerated well, no immediate complications Procedure, treatment alternatives,  risks and benefits explained, specific risks discussed. Consent was given by the patient. Immediately prior to procedure a time out was called to verify the correct patient, procedure, equipment, support staff and site/side marked as required. Patient was prepped and draped in the usual sterile fashion.     Clinical Data: No additional findings.  ROS:  All other systems negative, except as noted in the HPI. Review of Systems  Objective: Vital Signs: There were no vitals taken for this visit.  Specialty Comments:  No specialty comments available.  PMFS History: Patient Active Problem List   Diagnosis Date Noted   Impingement syndrome, shoulder, right 10/15/2021   Olecranon bursitis, left elbow 09/08/2021   Annual physical exam 06/01/2021   Polyneuropathy associated with underlying disease ([place]) 05/08/2019   Chronic left shoulder pain 01/02/2019   Hyperlipidemia associated with type 2 diabetes mellitus ([place]) 09/03/2017   Uncontrolled type 2 diabetes mellitus  with peripheral neuropathy 01/27/2016   Status post total right knee replacement 05/01/2015   Gout 09/13/2013   Chronic pain syndrome 09/13/2013   Primary osteoarthritis of left knee 09/13/2013   Chronic back pain 09/13/2013   Morbid obesity with body mass index (BMI) of 50.0 to 59.9 in adult ([place]) 09/13/2013   Seizures ([place])    Hypertension    Past Medical History:  Diagnosis Date   Chronic back pain 09/13/2013   Chronic pain syndrome 09/13/2013   GERD (gastroesophageal reflux disease)    hx. of   Gout 09/13/2013   Hemorrhoids    Hypertension    Morbid obesity with body mass index of 45.0-49.9 in adult [facility]) 09/13/2013   Osteoarthritis 09/13/2013   Pneumonia    hx. of   Seizures ([facility])    Sleep apnea    has never used C-pap machine due to insurance cost   Type 1 diabetes mellitus, uncontrolled     Family History  Problem Relation Age of Onset   Alcohol abuse Father    Arthritis Maternal Grandmother    Alcohol abuse Paternal Grandmother    Alcohol abuse Paternal Grandfather    Diabetes Maternal Aunt     Past Surgical History:  Procedure Laterality Date   broken toe 35 years ago     TOTAL KNEE ARTHROPLASTY Right 05/01/2015   Procedure: RIGHT TOTAL KNEE ARTHROPLASTY;  Surgeon: Mcarthur Rossetti, MD;  Location: [facility];  Service: Orthopedics;  Laterality: Right;   WISDOM TOOTH EXTRACTION     Social History   Occupational History   Occupation: Retired  Tobacco Use   Smoking status: Former    Types: Cigarettes    Quit date: 03/08/2004    Years since quitting: 18.0   Smokeless tobacco: Never  Substance and Sexual Activity   Alcohol use: No    Comment: rare   Drug use: No   Sexual activity: Yes    Partners: Female

## 2022-05-13 DIAGNOSIS — I8312 Varicose veins of left lower extremity with inflammation: Secondary | ICD-10-CM | POA: Diagnosis not present

## 2022-05-13 DIAGNOSIS — I8311 Varicose veins of right lower extremity with inflammation: Secondary | ICD-10-CM | POA: Diagnosis not present

## 2022-05-13 DIAGNOSIS — R600 Localized edema: Secondary | ICD-10-CM | POA: Diagnosis not present

## 2022-05-17 ENCOUNTER — Encounter: Payer: Self-pay | Admitting: Sports Medicine

## 2022-05-17 DIAGNOSIS — G894 Chronic pain syndrome: Secondary | ICD-10-CM

## 2022-05-17 DIAGNOSIS — M1712 Unilateral primary osteoarthritis, left knee: Secondary | ICD-10-CM

## 2022-05-19 MED ORDER — ARTHROTEC 75-0.2 MG PO TBEC: DELAYED_RELEASE_TABLET | ORAL | 3 refills | Status: DC

## 2022-05-19 NOTE — Telephone Encounter (Signed)
I spent 5 total minutes of online digital evaluation and management services in this patient-initiated request for online care. 

## 2022-05-24 ENCOUNTER — Encounter: Payer: Self-pay | Admitting: Internal Medicine

## 2022-05-24 ENCOUNTER — Ambulatory Visit (INDEPENDENT_AMBULATORY_CARE_PROVIDER_SITE_OTHER): Payer: Medicare Other | Admitting: Internal Medicine

## 2022-05-24 VITALS — BP 132/76 | HR 94 | Ht 66.5 in | Wt 347.2 lb

## 2022-05-24 DIAGNOSIS — E1142 Type 2 diabetes mellitus with diabetic polyneuropathy: Secondary | ICD-10-CM

## 2022-05-24 DIAGNOSIS — E1165 Type 2 diabetes mellitus with hyperglycemia: Secondary | ICD-10-CM

## 2022-05-24 DIAGNOSIS — E785 Hyperlipidemia, unspecified: Secondary | ICD-10-CM | POA: Diagnosis not present

## 2022-05-24 DIAGNOSIS — E1169 Type 2 diabetes mellitus with other specified complication: Secondary | ICD-10-CM

## 2022-05-24 DIAGNOSIS — Z6841 Body Mass Index (BMI) 40.0 and over, adult: Secondary | ICD-10-CM | POA: Diagnosis not present

## 2022-05-24 LAB — POCT GLYCOSYLATED HEMOGLOBIN (HGB A1C): Hemoglobin A1C: 6.5 % — AB (ref 4.0–5.6)

## 2022-05-24 NOTE — Patient Instructions (Addendum)
Please continue: - Metformin 2000 mg with dinner - U500 30 min before meals  100 units before b'fast 60 units before lunch 30-40 units before bedtime  Please return in 4 months with your sugar log.

## 2022-05-24 NOTE — Progress Notes (Addendum)
Patient ID: Christian [name], male   DOB: 11-03-1951, 71 y.o.   MRN: [id number]  HPI Christian [name] is a 71 y.o.-year-old male, returning for f/u for DM2, dx 2009, insulin-dependent since 2014, uncontrolled, with complications (DR, Peripheral neuropathy). Last visit: 4 months ago. Christian [name].  His wife accompanies him today.   No PCP now after Dr. Junius [name] left. He is applying for [employer] benefits.  Interim history: No increased urination, blurry vision, nausea, chest pain. He has imbalance and walks with a cane. He has muscle cramps and also a left great toe ulcer and lower leg cellulitis >> healed. Since last visit he had to come off Trulicity due to price.  However, he mentions that his sugars remained well controlled.  Reviewed HbA1c levels: Lab Results  Component Value Date   HGBA1C 7.1 (A) 01/17/2022   HGBA1C 8.4 (A) 09/14/2021   HGBA1C 7.7 (A) 05/14/2021   HGBA1C 7.5 (A) 01/12/2021   HGBA1C 7.8 (A) 09/08/2020   HGBA1C 7.0 (A) 05/14/2020   HGBA1C 7.5 (A) 01/09/2020   HGBA1C 6.9 (A) 09/09/2019   HGBA1C 7.6 (A) 05/08/2019   HGBA1C 8.2 (A) 01/08/2019   HGBA1C 7.7 (A) 09/25/2018   HGBA1C 7.4 (A) 05/25/2018   HGBA1C 9.0 02/22/2018   HGBA1C 9.1 11/23/2017   HGBA1C 9 08/24/2017   HGBA1C 8.4 05/25/2017   HGBA1C 8.8 02/06/2017   HGBA1C 8.8 11/08/2016   HGBA1C 7.3 08/03/2016   HGBA1C 7.7 05/03/2016  08/24/2017: HbA1c 9%  Pt is on: - Metformin 1000 mg 2x a daily >> 2000 mg with dinner >> at bedtime  stopped 01/2022 2/2 price - U500 30 min before meals - started 09/2021: 100 units before b'fast 50 >> 60 units before early dinner 60 >> 20 units before dinner and 30 units before bedtime >> 40 units at bedtime (1 am) Stopped Jardiance 25 mg >> but too expensive - in the donut hole; had yeast infections. Glipizide and Invokana were started 03/2014.  He has been on Victoza in the past >> not efficient anymore. We stopped glipizide in 01/2022  Pt checks his sugars once a day: -  am:  67, 72-148, 166, 243  >> 64, 72-156, 161, 209, 297 >> 74, 90-135 - 2h after b'fast: n/c >> 157 >> n/c >> 172 >> 120 >> 121, 184 - before lunch: 280 >> 125 >> 190-200 >> n/c >> 102 >> n/c - 2h after lunch: 220-230 >> n/c >> 190-220 >> n/c - before dinner: n/c >> 200s >> n/c  >> 150-185 >> 164 >> n/c - 2h after dinner: n/c >> 74 >> n/c - bedtime: 230, 281 >> 180s, 333 >> n/c >> 200, 331 >> n/c - nighttime: n/c >> 65 Lowest sugar was  85 >> 67 >> 64 >> 74, he has hypoglycemia awareness in the 90s Highest sugar was 258 >> 243 >> 318 >> 331 >> 184.  Meter: OneTouch Ultra Mini >> Christian [name]  -No CKD, last BUN/creatinine:  Lab Results  Component Value Date   BUN 27 (H) 05/14/2021   CREATININE 1.25 05/14/2021  Not on ACE inhibitor/ARB.  Normal ACR: Lab Results  Component Value Date   MICRALBCREAT 1.1 05/14/2021   MICRALBCREAT 0.7 09/09/2019   MICRALBCREAT 1.1 08/31/2017   MICRALBCREAT 0.8 08/20/2015   MICRALBCREAT 0.3 09/11/2013   Normal GFR levels: Lab Results  Component Value Date   GFRNONAA 84 09/09/2019   GFRNONAA >60 05/02/2015   GFRNONAA >60 04/27/2015   -+ HL; last set of lipids:  Lab Results  Component Value Date   CHOL 142 05/14/2021   HDL 32.30 (L) 05/14/2021   LDLCALC 84 05/14/2021   TRIG 131.0 05/14/2021   CHOLHDL 4 05/14/2021  No statins due to leg cramps.  - last eye exam was in 2022: Reportedly + DR  -+ numbness and tingling in his feet.  Last foot exam June 01, 2022.  He had R TKR in 04/2015.  He has steroid inj in L knee and L shoulder.  Also uses CBD oil liniment and this helps. She has OSA.  ROS: + See HPI + joint aches  I reviewed pt's medications, allergies, PMH, social hx, family hx, and changes were documented in the history of present illness. Otherwise, unchanged from my initial visit note.  Past Medical History:  Diagnosis Date   Chronic back pain 09/13/2013   Chronic pain syndrome 09/13/2013   GERD (gastroesophageal reflux disease)     hx. of   Gout 09/13/2013   Hemorrhoids    Hypertension    Morbid obesity with body mass index of 45.0-49.9 in adult [facility]) 09/13/2013   Osteoarthritis 09/13/2013   Pneumonia    hx. of   Seizures ([place])    Sleep apnea    has never used C-pap machine due to insurance cost   Type 1 diabetes mellitus, uncontrolled    Past Surgical History:  Procedure Laterality Date   broken toe 35 years ago     TOTAL KNEE ARTHROPLASTY Right 05/01/2015   Procedure: RIGHT TOTAL KNEE ARTHROPLASTY;  Surgeon: Christian Rossetti, MD;  Location: [facility];  Service: Orthopedics;  Laterality: Right;   WISDOM TOOTH EXTRACTION     Social History   Socioeconomic History   Marital status: Married    Spouse name: Christian [name]   Number of children: 3   Years of education: 14   Highest education level: Some college, no degree  Occupational History   Occupation: Retired  Tobacco Use   Smoking status: Former    Types: Cigarettes    Quit date: 03/08/2004    Years since quitting: 18.2   Smokeless tobacco: Never  Substance and Sexual Activity   Alcohol use: No    Comment: rare   Drug use: No   Sexual activity: Yes    Partners: Female  Other Topics Concern   Not on file  Social History Narrative   Lives with wife. He has three children. He enjoys Product manager.   Social Determinants of Health   Financial Resource Strain: Low Risk  (11/29/2021)   Overall Financial Resource Strain (CARDIA)    Difficulty of Paying Living Expenses: Not hard at all  Food Insecurity: No Food Insecurity (11/29/2021)   Hunger Vital Sign    Worried About Running Out of Food in the Last Year: Never true    Ran Out of Food in the Last Year: Never true  Transportation Needs: No Transportation Needs (11/29/2021)   PRAPARE - Hydrologist (Medical): No    Lack of Transportation (Non-Medical): No  Physical Activity: Inactive (11/29/2021)   Exercise Vital Sign    Days of Exercise per Week: 0 days     Minutes of Exercise per Session: 0 min  Stress: No Stress Concern Present (11/29/2021)   [place]    Feeling of Stress : Not at all  Social Connections: Moderately Integrated (11/29/2021)   Social Connection and Isolation Panel [NHANES]    Frequency of  Communication with Friends and Family: Twice a week    Frequency of Social Gatherings with Friends and Family: More than three times a week    Attends Religious Services: Never    Marine scientist or Organizations: Yes    Attends Music therapist: More than 4 times per year    Marital Status: Married  Human resources officer Violence: Not At Risk (11/29/2021)   Humiliation, Afraid, Rape, and Kick questionnaire    Fear of Current or Ex-Partner: No    Emotionally Abused: No    Physically Abused: No    Sexually Abused: No   Current Outpatient Medications on File Prior to Visit  Medication Sig Dispense Refill   ARTHROTEC 75-0.2 MG TBEC 1 tab p.o. twice daily, branded Arthrotec please 180 tablet 3   aspirin 81 MG tablet Take 81 mg by mouth once.     bisoprolol (ZEBETA) 5 MG tablet Take 1 PO qd x 1 week, then 1/2 PO qd x 1 week, then 1/2 PO qod x 1 week, then stop (Patient not taking: Reported on 11/29/2021) 30 tablet 0   Dulaglutide (TRULICITY) 3 QB/3.4LP SOPN Inject 3 mg into the skin once a week. 6 mL 3   hydrochlorothiazide (HYDRODIURIL) 12.5 MG tablet TAKE 1/2 TO 1 TABLET BY MOUTH EVERY DAY AS NEEDED FOR EDEMA 90 tablet 2   Insulin Pen Needle (BD PEN NEEDLE NANO 2ND GEN) 32G X 4 MM MISC Use as directed 5 times a day with insulin injections 200 each 5   insulin regular human CONCENTRATED (HUMULIN R U-500 KWIKPEN) 500 UNIT/ML KwikPen Inject 200 units under skin a day as advised 24 mL 3   metFORMIN (GLUCOPHAGE) 1000 MG tablet TAKE 1 TABLET BY MOUTH  TWICE DAILY WITH A MEAL 180 tablet 1   montelukast (SINGULAIR) 10 MG tablet TAKE 1 TABLET BY MOUTH AT BEDTIME AS NEEDED.  90 tablet 2   No current facility-administered medications on file prior to visit.   Allergies  Allergen Reactions   Penicillins     REACTION: swelling, rash   Family History  Problem Relation Age of Onset   Alcohol abuse Father    Arthritis Maternal Grandmother    Alcohol abuse Paternal Grandmother    Alcohol abuse Paternal Grandfather    Diabetes Maternal Aunt     PE: BP 132/76 (BP Location: Left Arm, Patient Position: Sitting, Cuff Size: Normal)   Pulse 94   Ht 5' 6.5" (1.689 m)   Wt (!) 347 lb 3.2 oz (157.5 kg)   SpO2 95%   BMI 55.20 kg/m     Wt Readings from Last 3 Encounters:  05/24/22 (!) 347 lb 3.2 oz (157.5 kg)  01/17/22 (!) 352 lb 12.8 oz (160 kg)  11/02/21 (!) 348 lb 6.4 oz (158 kg)   Constitutional: obese, in NAD Eyes: PERRLA, EOMI, no exophthalmos ENT: moist mucous membranes, no thyromegaly, no cervical lymphadenopathy Cardiovascular: RRR, No MRG, + LE periankle edema L>R Respiratory: CTA B Musculoskeletal: no deformities Skin: moist, warm, + stasis dermatitis rash bilateral lower legs Neurological: no tremor with outstretched hands Diabetic Foot Exam - Simple   Simple Foot Form Diabetic Foot exam was performed with the following findings: Yes 05/24/2022  3:20 PM  Visual Inspection See comments: Yes Sensation Testing See comments: Yes Pulse Check See comments: Yes Comments 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 Pitting edema B feet. Decreased pulses in B feet. Very decreased sensation in B feet.     ASSESSMENT: 1. DM2, insulin-dependent, uncontrolled,  with complications - PN - stable, worse in L foot - DR  2. PN  3.  Obesity class III  PLAN:  1. Patient with history of uncontrolled type 2 diabetes, on a complex medication regimen including oral medications (metformin and previously sulfonylurea, which was stopped at last visit), and also weekly GLP-1 receptor agonist and concentrated insulin, with still suboptimal control.  At last visit,  however, sugars were much better although he mostly checked in the morning.  The majority of them were at goal with only occasional higher blood sugar when he forgot to take his U-500 insulin he had 1 instance with sugars in the 60s in the morning but otherwise they were not low.  HbA1c was also improved, at 7.1%.  I advised him to stop glipizide, but otherwise we continued the rest of the regimen. -At this visit, sugars are mostly at goal.  He is checking blood sugars in the morning and only has a few values later in the day, but almost all of his blood sugars are at goal.  He mentions that he did see an improvement in his blood sugars after starting U-500 insulin at the end of last year and the sugars continues to improve.  He did see occasional blood sugars in the 70s in the morning and this is likely due to the fact that he is taking 40 units of U-500 insulin at bedtime.  We discussed about checking sugars at bedtime and to vary the dose that he is taking between 30 and 40 units.  Otherwise, I do not feel that he needs a change in his regimen.  Also, per his preference, we will continue without Trulicity for now. - I advised him to: Patient Instructions  Please continue: - Metformin 2000 mg with dinner - U500 30 min before meals  100 units before b'fast 60 units before lunch 30-40 units before bedtime  Please return in 4 months with your sugar log.   - we checked his HbA1c: 6.5% (lower) - advised to check sugars at different times of the day - 3x a day, rotating check times - advised for yearly eye exams >> he is UTD - return to clinic in 3-4 months  2. HL - reviewed latest lipid panel from 05/2021: LDL above goal, HDL low: Lab Results  Component Value Date   CHOL 142 05/14/2021   HDL 32.30 (L) 05/14/2021   LDLCALC 84 05/14/2021   TRIG 131.0 05/14/2021   CHOLHDL 4 05/14/2021  -He did not tolerate statins due to leg cramps -He is due for another lipid panel -we will check today  3.   Obesity class III -He saw nutrition 10/2019 -Before last visit, she gained 4 pounds -At last visit we added back Trulicity, of which he was off for 3 weeks -since last visit, however, he had to come off due to price -However, since last visit, he lost 5 pounds  Component     Latest Ref Rng 05/24/2022  Cholesterol     0 - 200 mg/dL 143   Triglycerides     0.0 - 149.0 mg/dL 165.0 (H)   HDL Cholesterol     >39.00 mg/dL 32.60 (L)   VLDL     0.0 - 40.0 mg/dL 33.0   LDL (calc)     0 - 99 mg/dL 77   Total CHOL/HDL Ratio 4   NonHDL 109.98   Sodium     135 - 146 mmol/L 140   Potassium  3.5 - 5.3 mmol/L 4.3   Chloride     98 - 110 mmol/L 105   CO2     20 - 32 mmol/L 20   Glucose     65 - 99 mg/dL 80   BUN     7 - 25 mg/dL 14   Creatinine     0.70 - 1.28 mg/dL 0.84   Calcium     8.6 - 10.3 mg/dL 9.1   Total Bilirubin     0.2 - 1.2 mg/dL 0.5   AST     10 - 35 U/L 23   ALT     9 - 46 U/L 26   Total Protein     6.1 - 8.1 g/dL 7.2   Microalb, Ur     0.0 - 1.9 mg/dL 1.0   Creatinine,U     mg/dL 156.6   MICROALB/CREAT RATIO     0.0 - 30.0 mg/g 0.6   BUN/Creatinine Ratio     6 - 22 (calc) NOT APPLICABLE   Albumin MSPROF     3.6 - 5.1 g/dL 4.0   Globulin     1.9 - 3.7 g/dL (calc) 3.2   AG Ratio     1.0 - 2.5 (calc) 1.3   Alkaline phosphatase (APISO)     35 - 144 U/L 80   eGFR     > OR = 60 mL/min/1.28m 93     LDL only slightly above goal, improved.  Triglycerides slightly high, HDL low.  Rest of the labs are at goal. I will recommend ezetimibe- will see if he wants to start.  CPhilemon Kingdom MD PhD [facility]in[facility]

## 2022-05-25 LAB — MICROALBUMIN / CREATININE URINE RATIO
Creatinine,U: 156.6 mg/dL
Microalb Creat Ratio: 0.6 mg/g (ref 0.0–30.0)
Microalb, Ur: 1 mg/dL (ref 0.0–1.9)

## 2022-05-25 LAB — COMPLETE METABOLIC PANEL WITH GFR
AG Ratio: 1.3 (calc) (ref 1.0–2.5)
ALT: 26 U/L (ref 9–46)
AST: 23 U/L (ref 10–35)
Albumin: 4 g/dL (ref 3.6–5.1)
Alkaline phosphatase (APISO): 80 U/L (ref 35–144)
BUN: 14 mg/dL (ref 7–25)
CO2: 20 mmol/L (ref 20–32)
Calcium: 9.1 mg/dL (ref 8.6–10.3)
Chloride: 105 mmol/L (ref 98–110)
Creat: 0.84 mg/dL (ref 0.70–1.28)
Globulin: 3.2 g/dL (calc) (ref 1.9–3.7)
Glucose, Bld: 80 mg/dL (ref 65–99)
Potassium: 4.3 mmol/L (ref 3.5–5.3)
Sodium: 140 mmol/L (ref 135–146)
Total Bilirubin: 0.5 mg/dL (ref 0.2–1.2)
Total Protein: 7.2 g/dL (ref 6.1–8.1)
eGFR: 93 mL/min/{1.73_m2} (ref >=60)

## 2022-05-25 LAB — LIPID PANEL
Cholesterol: 143 mg/dL (ref 0–200)
HDL: 32.6 mg/dL — ABNORMAL LOW (ref >39.00)
LDL Cholesterol: 77 mg/dL (ref 0–99)
NonHDL: 109.98
Total CHOL/HDL Ratio: 4
Triglycerides: 165 mg/dL — ABNORMAL HIGH (ref 0.0–149.0)
VLDL: 33 mg/dL (ref 0.0–40.0)

## 2022-05-30 MED ORDER — DICLOFENAC SODIUM 75 MG PO TBEC: 75.0000 mg | DELAYED_RELEASE_TABLET | Freq: Two times a day (BID) | ORAL | 3 refills | Status: DC

## 2022-05-30 MED ORDER — OMEPRAZOLE 20 MG PO CPDR: 20.0000 mg | DELAYED_RELEASE_CAPSULE | Freq: Every day | ORAL | 3 refills | Status: DC

## 2022-05-30 NOTE — Addendum Note (Signed)
Addended by: Monica Becton on: 05/30/2022 11:56 AM   Modules accepted: Orders

## 2022-06-15 DIAGNOSIS — I8312 Varicose veins of left lower extremity with inflammation: Secondary | ICD-10-CM | POA: Diagnosis not present

## 2022-06-16 NOTE — Telephone Encounter (Signed)
Error

## 2022-07-19 DIAGNOSIS — I8312 Varicose veins of left lower extremity with inflammation: Secondary | ICD-10-CM | POA: Diagnosis not present

## 2022-07-21 ENCOUNTER — Other Ambulatory Visit: Payer: Self-pay | Admitting: Internal Medicine

## 2022-07-22 DIAGNOSIS — I8312 Varicose veins of left lower extremity with inflammation: Secondary | ICD-10-CM | POA: Diagnosis not present

## 2022-08-18 ENCOUNTER — Other Ambulatory Visit: Payer: Self-pay | Admitting: Internal Medicine

## 2022-08-22 DIAGNOSIS — I83892 Varicose veins of left lower extremities with other complications: Secondary | ICD-10-CM | POA: Diagnosis not present

## 2022-09-19 ENCOUNTER — Ambulatory Visit (INDEPENDENT_AMBULATORY_CARE_PROVIDER_SITE_OTHER): Payer: Medicare Other

## 2022-09-19 ENCOUNTER — Ambulatory Visit (INDEPENDENT_AMBULATORY_CARE_PROVIDER_SITE_OTHER): Payer: Medicare Other | Admitting: Orthopaedic Surgery

## 2022-09-19 ENCOUNTER — Encounter: Payer: Self-pay | Admitting: Orthopaedic Surgery

## 2022-09-19 ENCOUNTER — Other Ambulatory Visit: Payer: Self-pay

## 2022-09-19 VITALS — Ht 66.5 in | Wt 350.0 lb

## 2022-09-19 DIAGNOSIS — M1712 Unilateral primary osteoarthritis, left knee: Secondary | ICD-10-CM | POA: Diagnosis not present

## 2022-09-19 DIAGNOSIS — G8929 Other chronic pain: Secondary | ICD-10-CM

## 2022-09-19 DIAGNOSIS — Z6841 Body Mass Index (BMI) 40.0 and over, adult: Secondary | ICD-10-CM | POA: Diagnosis not present

## 2022-09-19 DIAGNOSIS — M25562 Pain in left knee: Secondary | ICD-10-CM

## 2022-09-19 MED ORDER — LIDOCAINE HCL 1 % IJ SOLN
3.0000 mL | INTRAMUSCULAR | Status: AC | PRN
  Administered 2022-09-19: 3 mL

## 2022-09-19 MED ORDER — METHYLPREDNISOLONE ACETATE 40 MG/ML IJ SUSP
40.0000 mg | INTRAMUSCULAR | Status: AC | PRN
  Administered 2022-09-19: 40 mg via INTRA_ARTICULAR

## 2022-09-19 NOTE — Progress Notes (Signed)
The patient comes in with continued significant knee pain with his left knee.  He has had numerous injections over the years in that knee.  He has a history of a right knee replacement in 2016 that is done well.  He is morbidly obese with a BMI today of 55.65.  Earlier this year he was given with some ulcers of the toe and was seen by Dr. Lajoyce Corners for this.  In May of this year he did have a steroid injection in his left knee by Dr. Lajoyce Corners.  A hemoglobin A1c 3 months ago was 6.5.  He is requesting at least a steroid injection in his left knee today.  He would also like to get an appointment with Dr. August Saucer to assess his shoulders.  He is to see Dr. 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  On exam his right knee has a well-healed surgical incision from previous knee replacement surgery.  The left knee has slight varus malalignment with global tenderness and patellofemoral crepitation and limited motion.  He has venous stasis changes on his legs.  He said his great toe ulcer had healed.  2 views of the left knee show severe end-stage arthritis with bone-on-bone wear in all 3 compartments.  Given his morbid obesity and the fact that he has been THN, we cannot schedule knee replacement surgery on him until he loses significant weight.  We can send him to Dr. Francena Hanly weight loss clinic.  We will also get him seen by Dr. August Saucer for his shoulders.  Per the patient's request I did provide a steroid injection in his left knee that he tolerated well.  He will watch his blood glucose closely given the fact that steroid injections can blood sugars up but again his A1c has been trending nicely down.     Procedure Note  Patient: Christian [name]             Date of Birth: 07/27/1951           MRN: [id number]             Visit Date: 09/19/2022  Procedures: Visit Diagnoses:  1. Chronic pain of left knee   2. Morbid obesity with body mass index (BMI) of 50.0 to 59.9  in adult ([facility])   3. Unilateral primary osteoarthritis, left knee     Large Joint Inj: L knee on 09/19/2022 2:16 PM Indications: diagnostic evaluation and pain Details: 22 G 1.5 in needle, superolateral approach  Arthrogram: No  Medications: 3 mL lidocaine 1 %; 40 mg methylPREDNISolone acetate 40 MG/ML Outcome: tolerated well, no immediate complications Procedure, treatment alternatives, risks and benefits explained, specific risks discussed. Consent was given by the patient. Immediately prior to procedure a time out was called to verify the correct patient, procedure, equipment, support staff and site/side marked as required. Patient was prepped and draped in the usual sterile fashion.

## 2022-09-20 ENCOUNTER — Encounter (INDEPENDENT_AMBULATORY_CARE_PROVIDER_SITE_OTHER): Payer: Self-pay

## 2022-10-03 ENCOUNTER — Encounter: Payer: Self-pay | Admitting: Internal Medicine

## 2022-10-03 ENCOUNTER — Ambulatory Visit (INDEPENDENT_AMBULATORY_CARE_PROVIDER_SITE_OTHER): Payer: Medicare Other | Admitting: Internal Medicine

## 2022-10-03 ENCOUNTER — Other Ambulatory Visit: Payer: Self-pay | Admitting: Internal Medicine

## 2022-10-03 VITALS — BP 128/80 | HR 111 | Ht 66.5 in | Wt 351.0 lb

## 2022-10-03 DIAGNOSIS — E1142 Type 2 diabetes mellitus with diabetic polyneuropathy: Secondary | ICD-10-CM | POA: Diagnosis not present

## 2022-10-03 DIAGNOSIS — E1165 Type 2 diabetes mellitus with hyperglycemia: Secondary | ICD-10-CM

## 2022-10-03 DIAGNOSIS — Z6841 Body Mass Index (BMI) 40.0 and over, adult: Secondary | ICD-10-CM | POA: Diagnosis not present

## 2022-10-03 LAB — POCT GLYCOSYLATED HEMOGLOBIN (HGB A1C): Hemoglobin A1C: 7 % — AB (ref 4.0–5.6)

## 2022-10-03 MED ORDER — FREESTYLE LIBRE 2 READER DEVI: 1.0000 | Freq: Every day | 3 refills | Status: DC

## 2022-10-03 MED ORDER — FREESTYLE LIBRE 2 SENSOR MISC: 1.0000 | 3 refills | Status: DC

## 2022-10-03 NOTE — Progress Notes (Signed)
Patient ID: Elba BarmanGeorge E Natividad, male   DOB: 11/04/1951, 71 y.o.   MRN: [id number]  HPI Elba BarmanGeorge E Dockham is a 71 y.o.-year-old male, returning for f/u for DM2, dx 2009, insulin-dependent since 2014, uncontrolled, with complications (DR, Peripheral neuropathy). Last visit: 4 months ago. [name] [employer].   No PCP now after Dr. Prince RomeHilts left. He is applying for VA benefits.  Interim history: No increased urination, blurry vision, nausea, chest pain. He has imbalance and walks with a cane. He has muscle cramps. He continues to have left knee and shoulder pain.  He cannot have total knee replacement due to his high BMI.  He just got a stationary bike. He had a steroid inj. Last week >> sugars higher: 285 CBG in am.  Reviewed HbA1c levels: Lab Results  Component Value Date   HGBA1C 6.5 (A) 05/24/2022   HGBA1C 7.1 (A) 01/17/2022   HGBA1C 8.4 (A) 09/14/2021   HGBA1C 7.7 (A) 05/14/2021   HGBA1C 7.5 (A) 01/12/2021   HGBA1C 7.8 (A) 09/08/2020   HGBA1C 7.0 (A) 05/14/2020   HGBA1C 7.5 (A) 01/09/2020   HGBA1C 6.9 (A) 09/09/2019   HGBA1C 7.6 (A) 05/08/2019   HGBA1C 8.2 (A) 01/08/2019   HGBA1C 7.7 (A) 09/25/2018   HGBA1C 7.4 (A) 05/25/2018   HGBA1C 9.0 02/22/2018   HGBA1C 9.1 11/23/2017   HGBA1C 9 08/24/2017   HGBA1C 8.4 05/25/2017   HGBA1C 8.8 02/06/2017   HGBA1C 8.8 11/08/2016   HGBA1C 7.3 08/03/2016  08/24/2017: HbA1c 9%  Pt is on: - Metformin 1000 mg 2x a daily >> 2000 mg with dinner >> at bedtime - U500 30 min before meals - started 09/2021: 100 units before b'fast 50 >> 60 units before early dinner 30-40 >> 40 units before bedtime Stopped Jardiance 25 mg >> but too expensive - in the donut hole; had yeast infections. Glipizide and Invokana were started 03/2014.  He has been on Victoza in the past >> not efficient anymore. We stopped glipizide in 01/2022. Previously on Lantus and Humalog high doses. He stopped Trulicity 01/2022 due to price.  Pt checks his sugars once a day: - am:   64, 72-156, 161, 209, 297 >> 74, 90-135 >> 90-146, 230 - 2h after b'fast: 157 >> n/c >> 172 >> 120 >> 121, 184 >> n/c - before lunch: 280 >> 125 >> 190-200 >> n/c >> 102 >> n/c - 2h after lunch: 220-230 >> n/c >> 190-220 >> n/c - before dinner: n/c >> 200s >> n/c  >> 150-185 >> 164 >> n/c - 2h after dinner: n/c >> 74 >> n/c - bedtime: 230, 281 >> 180s, 333 >> n/c >> 200, 331 >> n/c - nighttime: n/c >> 65 >> 57 - 1x a mo Lowest sugar was  64 >> 74 >> 57, he has hypoglycemia awareness in the 90s Highest sugar was 331 >> 184 >> 285 (steroid inj).  Meter: OneTouch Ultra Mini >> [name]  -No CKD, last BUN/creatinine:  Lab Results  Component Value Date   BUN 14 05/24/2022   CREATININE 0.84 05/24/2022  Not on ACE inhibitor/ARB.  Normal ACR: Lab Results  Component Value Date   MICRALBCREAT 0.6 05/24/2022   MICRALBCREAT 1.1 05/14/2021   MICRALBCREAT 0.7 09/09/2019   MICRALBCREAT 1.1 08/31/2017   MICRALBCREAT 0.8 08/20/2015   MICRALBCREAT 0.3 09/11/2013   Normal GFR levels: Lab Results  Component Value Date   GFRNONAA 84 09/09/2019   GFRNONAA >60 05/02/2015   GFRNONAA >60 04/27/2015   -+ HL; last set  of lipids: Lab Results  Component Value Date   CHOL 143 05/24/2022   HDL 32.60 (L) 05/24/2022   LDLCALC 77 05/24/2022   TRIG 165.0 (H) 05/24/2022   CHOLHDL 4 05/24/2022  No statins due to leg cramps.  - last eye exam was in 2022: Reportedly + DR  -+ numbness and tingling in his feet.  Last foot exam June 01, 2022.  He had R TKR in 04/2015.  He has steroid inj in L knee and L shoulder.  Also uses CBD oil liniment and this helps. She has OSA.  ROS: + See HPI  I reviewed pt's medications, allergies, PMH, social hx, family hx, and changes were documented in the history of present illness. Otherwise, unchanged from my initial visit note.  Past Medical History:  Diagnosis Date   Chronic back pain 09/13/2013   Chronic pain syndrome 09/13/2013   GERD (gastroesophageal reflux  disease)    hx. of   Gout 09/13/2013   Hemorrhoids    Hypertension    Morbid obesity with body mass index of 45.0-49.9 in adult [facility]) 09/13/2013   Osteoarthritis 09/13/2013   Pneumonia    hx. of   Seizures (HCC)    Sleep apnea    has never used C-pap machine due to insurance cost   Type 1 diabetes mellitus, uncontrolled    Past Surgical History:  Procedure Laterality Date   broken toe 35 years ago     TOTAL KNEE ARTHROPLASTY Right 05/01/2015   Procedure: RIGHT TOTAL KNEE ARTHROPLASTY;  Surgeon: Kathryne Hitch, MD;  Location: [facility];  Service: Orthopedics;  Laterality: Right;   WISDOM TOOTH EXTRACTION     Social History   Socioeconomic History   Marital status: Married    Spouse name: Hilda Lias   Number of children: 3   Years of education: 14   Highest education level: Some college, no degree  Occupational History   Occupation: Retired  Tobacco Use   Smoking status: Former    Types: Cigarettes    Quit date: 03/08/2004    Years since quitting: 18.5   Smokeless tobacco: Never  Substance and Sexual Activity   Alcohol use: No    Comment: rare   Drug use: No   Sexual activity: Yes    Partners: Female  Other Topics Concern   Not on file  Social History Narrative   Lives with wife. He has three children. He enjoys Diplomatic Services operational officer.   Social Determinants of Health   Financial Resource Strain: Low Risk  (11/29/2021)   Overall Financial Resource Strain (CARDIA)    Difficulty of Paying Living Expenses: Not hard at all  Food Insecurity: No Food Insecurity (11/29/2021)   Hunger Vital Sign    Worried About Running Out of Food in the Last Year: Never true    Ran Out of Food in the Last Year: Never true  Transportation Needs: No Transportation Needs (11/29/2021)   PRAPARE - Administrator, Civil Service (Medical): No    Lack of Transportation (Non-Medical): No  Physical Activity: Inactive (11/29/2021)   Exercise Vital Sign    Days of Exercise per Week:  0 days    Minutes of Exercise per Session: 0 min  Stress: No Stress Concern Present (11/29/2021)   [employer] of Occupational Health - Occupational Stress Questionnaire    Feeling of Stress : Not at all  Social Connections: Moderately Integrated (11/29/2021)   Social Connection and Isolation Panel [NHANES]    Frequency of  Communication with Friends and Family: Twice a week    Frequency of Social Gatherings with Friends and Family: More than three times a week    Attends Religious Services: Never    Database administrator or Organizations: Yes    Attends Engineer, structural: More than 4 times per year    Marital Status: Married  Catering manager Violence: Not At Risk (11/29/2021)   Humiliation, Afraid, Rape, and Kick questionnaire    Fear of Current or Ex-Partner: No    Emotionally Abused: No    Physically Abused: No    Sexually Abused: No   Current Outpatient Medications on File Prior to Visit  Medication Sig Dispense Refill   aspirin 81 MG tablet Take 81 mg by mouth once.     bisoprolol (ZEBETA) 5 MG tablet Take 1 PO qd x 1 week, then 1/2 PO qd x 1 week, then 1/2 PO qod x 1 week, then stop (Patient not taking: Reported on 11/29/2021) 30 tablet 0   diclofenac (VOLTAREN) 75 MG EC tablet Take 1 tablet (75 mg total) by mouth 2 (two) times daily. With omeprazole 180 tablet 3   Dulaglutide (TRULICITY) 3 MG/0.5ML SOPN Inject 3 mg into the skin once a week. (Patient not taking: Reported on 05/24/2022) 6 mL 3   HUMULIN R U-500 KWIKPEN 500 UNIT/ML KwikPen INJECT 200 UNITS UNDER SKIN A DAY AS ADVISED 24 mL 3   hydrochlorothiazide (HYDRODIURIL) 12.5 MG tablet TAKE 1/2 TO 1 TABLET BY MOUTH EVERY DAY AS NEEDED FOR EDEMA 90 tablet 2   Insulin Pen Needle (BD PEN NEEDLE NANO 2ND GEN) 32G X 4 MM MISC Use as directed 5 times a day with insulin injections 200 each 5   metFORMIN (GLUCOPHAGE) 1000 MG tablet TAKE 1 TABLET BY MOUTH TWICE  DAILY WITH A MEAL 180 tablet 3   montelukast (SINGULAIR) 10  MG tablet TAKE 1 TABLET BY MOUTH AT BEDTIME AS NEEDED. 90 tablet 2   omeprazole (PRILOSEC) 20 MG capsule Take 1 capsule (20 mg total) by mouth daily. With diclofenac 90 capsule 3   No current facility-administered medications on file prior to visit.   Allergies  Allergen Reactions   Penicillins     REACTION: swelling, rash   Family History  Problem Relation Age of Onset   Alcohol abuse Father    Arthritis Maternal Grandmother    Alcohol abuse Paternal Grandmother    Alcohol abuse Paternal Grandfather    Diabetes Maternal Aunt    PE: BP 128/80 (BP Location: Right Arm, Patient Position: Sitting, Cuff Size: Normal)   Pulse (!) 111   Ht 5' 6.5" (1.689 m)   Wt (!) 351 lb (159.2 kg)   SpO2 97%   BMI 55.80 kg/m     Wt Readings from Last 3 Encounters:  10/03/22 (!) 351 lb (159.2 kg)  09/19/22 (!) 350 lb (158.8 kg)  05/24/22 (!) 347 lb 3.2 oz (157.5 kg)   Constitutional: obese, in NAD Eyes: EOMI, no exophthalmos ENT: no thyromegaly, no cervical lymphadenopathy Cardiovascular: RRR, No MRG, + LE periankle edema L>R Respiratory: CTA B Musculoskeletal: no deformities Skin:+ stasis dermatitis rash bilateral lower legs Neurological: no tremor with outstretched hands  ASSESSMENT: 1. DM2, insulin-dependent, uncontrolled, with complications - PN - stable, worse in L foot - DR  2. PN  3.  Obesity class III  PLAN:  1. Patient with history of uncontrolled type 2 diabetes, previously on a complex medication regimen, currently only on metformin and U-500 insulin.  Of note, Trulicity was not covered for him anymore so he had to stop.  At last visit, per his preference, we continued without GLP-1 receptor agonists.  At that time, sugars are mostly at goal so we did not change his regimen.  HbA1c was lower, at 6.5%, at goal. -At today's visit, he only checks blood sugars in the morning and his sugars are at target but with occasional values above target.  We discussed that it is imperative  that he starts checking sugars later in the day, also.  He is interested in a CGM.  I sent a prescription for this to his pharmacy.  I advised him that he could check blood sugars with his iPhone. -He mentions that he has had some low blood sugars during the night, approximately once a month, in the 50s.  Upon questioning, he is taking 40 units of U-500 insulin at bedtime, without checking blood sugars.  We discussed that it is very risky to do so so I advised him to always check before taking the bedtime insulin to see if he absolutely needs to take it.  Starting on the CGM will greatly help.  Otherwise, I did not suggest a change in his regimen unless we get more blood sugar information. - I advised him to: Patient Instructions  Please  continue: - Metformin 2000 mg with at bedtime - U500 30 min before meals  100 units before b'fast 60 units before lunch 30-40 units before bedtime  Please do not take the bedtime insulin without checking blood sugars.  Please return in 4 months with your sugar log.   - we checked his HbA1c: 7.0% (higher) - advised to check sugars at different times of the day - 4x a day, rotating check times - advised for yearly eye exams >> he is not UTD - return to clinic in 4 months  2. HL -Reviewed still [facility]'s lipid panel from 05/2022: LDL slightly higher than goal, triglycerides also slightly high, HDL low: Lab Results  Component Value Date   CHOL 143 05/24/2022   HDL 32.60 (L) 05/24/2022   LDLCALC 77 05/24/2022   TRIG 165.0 (H) 05/24/2022   CHOLHDL 4 05/24/2022  -He did not tolerate statins due to leg cramps.  At last visit, I suggested to add ezetimibe.  He is not on this.  3.  Obesity class III -He saw nutrition 10/2019 -Unfortunately he had to come off Trulicity due to price -Before last visit he lost 5 pounds, previously gained 4 He gained 4 pounds since last visit.-  Carlus Pavlov, MD PhD [facility] Endocrinology

## 2022-10-03 NOTE — Patient Instructions (Addendum)
Please  continue: - Metformin 2000 mg with at bedtime - U500 30 min before meals  100 units before b'fast 60 units before lunch 30-40 units before bedtime  Please do not take the bedtime insulin without checking blood sugars.  Please return in 4 months with your sugar log.

## 2022-10-10 ENCOUNTER — Ambulatory Visit: Payer: Medicare Other | Admitting: Orthopedic Surgery

## 2022-10-24 ENCOUNTER — Ambulatory Visit (INDEPENDENT_AMBULATORY_CARE_PROVIDER_SITE_OTHER): Payer: Medicare Other | Admitting: Orthopedic Surgery

## 2022-10-24 DIAGNOSIS — S46211A Strain of muscle, fascia and tendon of other parts of biceps, right arm, initial encounter: Secondary | ICD-10-CM | POA: Diagnosis not present

## 2022-10-25 DIAGNOSIS — L97221 Non-pressure chronic ulcer of left calf limited to breakdown of skin: Secondary | ICD-10-CM | POA: Diagnosis not present

## 2022-10-25 DIAGNOSIS — I8312 Varicose veins of left lower extremity with inflammation: Secondary | ICD-10-CM | POA: Diagnosis not present

## 2022-10-25 DIAGNOSIS — I83202 Varicose veins of unspecified lower extremity with both ulcer of calf and inflammation: Secondary | ICD-10-CM | POA: Diagnosis not present

## 2022-10-29 ENCOUNTER — Encounter: Payer: Self-pay | Admitting: Orthopedic Surgery

## 2022-10-29 NOTE — Progress Notes (Signed)
Office Visit Note   Patient: Christian [name]           Date of Birth: [birth year]           MRN: [id number] Visit Date: 10/24/2022 Requested by: Monica Becton, MD [address] [place],  [place] [address] PCP: Monica Becton, MD  Subjective: Chief Complaint  Patient presents with   Shoulder Pain         HPI: Christian [name] is a 71 y.o. male who presents to the office reporting bilateral shoulder pain left greater than right for a year.  He states that he needs his right arm more functional because he is right-hand dominant.  Describes decreased range of motion and decreased ability with ADLs.  Hard for him to be able to "pick up a plate of food because it is too heavy".  The pain wakes him from sleep at night.  He is really only able to do work out in front of him.  Describes increased pain since last year moving a heavy chair.  Felt a pop at that time.  Did have some bruising at that time.  Has had a shot in his left shoulder which also did not help..                ROS: All systems reviewed are negative as they relate to the chief complaint within the history of present illness.  Patient denies fevers or chills.  Assessment & Plan: Visit Diagnoses:  1. Rupture of right proximal biceps tendon, initial encounter     Plan: Impression is significant weakness in both shoulders with likely rotator cuff arthropathy and rotator cuff tear on the right-hand side.  Plan is MRI arthrogram to evaluate what is likely a massive rotator cuff tear to determine surgical options which in reality would likely only be reverse shoulder replacement.  Follow-up after that study.  Follow-Up Instructions: No follow-ups on file.   Orders:  Orders Placed This Encounter  Procedures   MR SHOULDER RIGHT W CONTRAST   Arthrogram   No orders of the defined types were placed in this encounter.     Procedures: No procedures performed   Clinical Data: No additional  findings.  Objective: Vital Signs: There were no vitals taken for this visit.  Physical Exam:  Constitutional: Patient appears well-developed HEENT:  Head: Normocephalic Eyes:EOM are normal Neck: Normal range of motion Cardiovascular: Normal rate Pulmonary/chest: Effort normal Neurologic: Patient is alert Skin: Skin is warm Psychiatric: Patient has normal mood and affect  Ortho Exam: Ortho exam demonstrates passive range of motion on the left of 80/100/170.  Passive range of motion on the right is 70/100/170.  Patient has 20 degrees of active forward flexion on the left and significant external rotation weakness on the right with forward flexion to about 90 degrees actively on that side.  Motor or sensory function of the hand is intact.  Specialty Comments:  No specialty comments available.  Imaging: No results found.   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 of   Gout 09/13/2013   Hemorrhoids    Hypertension    Morbid obesity with body mass index of 45.0-49.9 in adult [facility]) 09/13/2013    Osteoarthritis 09/13/2013   Pneumonia    hx. of   Seizures (HCC)    Sleep apnea    has never used C-pap machine due to insurance cost   Type 1 diabetes mellitus, uncontrolled     Family History  Problem Relation Age of Onset   Alcohol abuse Father    Arthritis Maternal Grandmother    Alcohol abuse Paternal Grandmother    Alcohol abuse Paternal Grandfather    Diabetes Maternal Aunt     Past Surgical History:  Procedure Laterality Date   broken toe 35 years ago     TOTAL KNEE ARTHROPLASTY Right 05/01/2015   Procedure: RIGHT TOTAL KNEE ARTHROPLASTY;  Surgeon: Kathryne Hitch, MD;  Location: [facility];  Service: Orthopedics;  Laterality: Right;   WISDOM TOOTH EXTRACTION     Social History   Occupational History   Occupation: Retired  Tobacco Use   Smoking status: Former    Types: Cigarettes    Quit date: 03/08/2004    Years since quitting: 18.6   Smokeless tobacco: Never  Substance and Sexual Activity   Alcohol use: No    Comment: rare   Drug use: No   Sexual activity: Yes    Partners: Female

## 2022-11-16 ENCOUNTER — Ambulatory Visit
Admission: RE | Admit: 2022-11-16 | Discharge: 2022-11-16 | Disposition: A | Discharge status: Home / Self Care | Payer: Medicare Other | Source: Ambulatory Visit | Attending: Orthopedic Surgery | Admitting: Orthopedic Surgery

## 2022-11-16 DIAGNOSIS — S4991XA Unspecified injury of right shoulder and upper arm, initial encounter: Secondary | ICD-10-CM | POA: Diagnosis not present

## 2022-11-16 DIAGNOSIS — S46011A Strain of muscle(s) and tendon(s) of the rotator cuff of right shoulder, initial encounter: Secondary | ICD-10-CM | POA: Diagnosis not present

## 2022-11-16 DIAGNOSIS — S46211A Strain of muscle, fascia and tendon of other parts of biceps, right arm, initial encounter: Secondary | ICD-10-CM

## 2022-11-16 MED ORDER — IOPAMIDOL (ISOVUE-M 200) INJECTION 41%
18.0000 mL | Freq: Once | INTRAMUSCULAR | Status: AC
  Administered 2022-11-16: 18 mL via INTRA_ARTICULAR

## 2022-11-23 ENCOUNTER — Encounter: Payer: Self-pay | Admitting: Orthopedic Surgery

## 2022-11-23 ENCOUNTER — Ambulatory Visit (INDEPENDENT_AMBULATORY_CARE_PROVIDER_SITE_OTHER): Payer: Medicare Other | Admitting: Orthopedic Surgery

## 2022-11-23 DIAGNOSIS — M12812 Other specific arthropathies, not elsewhere classified, left shoulder: Secondary | ICD-10-CM | POA: Diagnosis not present

## 2022-11-23 DIAGNOSIS — M12811 Other specific arthropathies, not elsewhere classified, right shoulder: Secondary | ICD-10-CM | POA: Diagnosis not present

## 2022-11-23 DIAGNOSIS — Z6841 Body Mass Index (BMI) 40.0 and over, adult: Secondary | ICD-10-CM | POA: Diagnosis not present

## 2022-11-23 DIAGNOSIS — M1712 Unilateral primary osteoarthritis, left knee: Secondary | ICD-10-CM

## 2022-11-23 NOTE — Progress Notes (Signed)
Office Visit Note   Patient: Christian [name]           Date of Birth: 1951/01/27           MRN: [id number] Visit Date: 11/23/2022 Requested by: Silverio Decamp, [address] [place],  [place] [address] PCP: Silverio Decamp, MD  Subjective: Chief Complaint  Patient presents with   Right Shoulder - Pain    HPI: Christian [name] is a 72 y.o. male who presents to the office reporting bilateral shoulder pain right worse than left.  Since he was last seen he had an MRI scan of the right shoulder which demonstrates full-thickness full width tears of the supraspinatus and infraspinatus and subscapularis tendons.  All these are retracted to the glenoid.  Moderate chondrosis in the glenohumeral joint is present.  Patient has very diminished function of both shoulders particular on the right-hand side.  His wife describes him as a "T rex".  He also has had right total knee replacement and needs left total knee replacement but does not meet the BMI requirements for that.  He has a lot of mitigating and complicating medical comorbidities some of which are treatable and some of which are manageable..                ROS: All systems reviewed are negative as they relate to the chief complaint within the history of present illness.  Patient denies fevers or chills.  Assessment & Plan: Visit Diagnoses: No diagnosis found.  Plan: Impression is right shoulder rotator cuff arthropathy with maintenance of passive range of motion but severe limitation of active motion.  He does use a walker for ambulation.  Discussed with N[place]on with the use of models and the next nation of the biomechanics of shoulder replacement how reverse shoulder replacement could improve his pain and function of that right arm.  In [place]'s case I would favor offset glenoid and possible constrained liner due to possibility for instability in this patient with no rotator cuff remaining.  I do think that he  would put a higher demand and most on his shoulder based on his current lower extremity predicament.  Nonetheless the risk and benefits of shoulder replacement are discussed with the patient including not limited to infection nerve and vessel damage shoulder stiffness incomplete pain relief and incomplete restoration of function.  Risk of instability also discussed.  He will consider his options and let me know if he wants to set up think that CT scanning as a preamble for patient specific instrumentation guide production and reverse shoulder replacement.  Follow-Up Instructions: No follow-ups on file.   Orders:  No orders of the defined types were placed in this encounter.  No orders of the defined types were placed in this encounter.     Procedures: No procedures performed   Clinical Data: No additional findings.  Objective: Vital Signs: There were no vitals taken for this visit.  Physical Exam:  Constitutional: Patient appears well-developed HEENT:  Head: Normocephalic Eyes:EOM are normal Neck: Normal range of motion Cardiovascular: Normal rate Pulmonary/chest: Effort normal Neurologic: Patient is alert Skin: Skin is warm Psychiatric: Patient has normal mood and affect  Ortho Exam: Ortho exam demonstrates on the right-hand side passive range of motion of 60/90/150.  Deltoid is functional.  Active range of motion is at most 40 degrees of abduction and forward flexion.  Motor sensory function to the hand is intact.  Biceps and triceps is functional.  Left shoulder also has less than 90 degrees of active forward flexion and abduction.   Specialty Comments:  No specialty comments available.  Imaging: No results found.   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 of   Gout 09/13/2013   Hemorrhoids    Hypertension    Morbid obesity with body mass index of 45.0-49.9 in adult [facility]) 09/13/2013   Osteoarthritis 09/13/2013   Pneumonia    hx. of   Seizures (HCC)    Sleep apnea    has never used C-pap machine due to insurance cost   Type 1 diabetes mellitus, uncontrolled     Family History  Problem Relation Age of Onset   Alcohol abuse Father    Arthritis Maternal Grandmother    Alcohol abuse Paternal Grandmother    Alcohol abuse Paternal Grandfather    Diabetes Maternal Aunt     Past Surgical History:  Procedure Laterality Date   broken toe 35 years ago     TOTAL KNEE ARTHROPLASTY Right 05/01/2015   Procedure: RIGHT TOTAL KNEE ARTHROPLASTY;  Surgeon: Mcarthur Rossetti, MD;  Location: [facility];  Service: Orthopedics;  Laterality: Right;   WISDOM TOOTH EXTRACTION     Social History   Occupational History   Occupation: Retired  Tobacco Use   Smoking status: Former    Types: Cigarettes    Quit date: 03/08/2004    Years since quitting: 18.7   Smokeless tobacco: Never  Substance and Sexual Activity   Alcohol use: No    Comment: rare   Drug use: No   Sexual activity: Yes    Partners: Female

## 2022-11-28 ENCOUNTER — Other Ambulatory Visit: Payer: Self-pay | Admitting: Internal Medicine

## 2022-11-28 DIAGNOSIS — E1142 Type 2 diabetes mellitus with diabetic polyneuropathy: Secondary | ICD-10-CM

## 2022-12-02 ENCOUNTER — Ambulatory Visit (INDEPENDENT_AMBULATORY_CARE_PROVIDER_SITE_OTHER): Payer: Medicare Other | Admitting: Sports Medicine

## 2022-12-02 DIAGNOSIS — Z0000 Encounter for general adult medical examination without abnormal findings: Secondary | ICD-10-CM | POA: Diagnosis not present

## 2022-12-02 DIAGNOSIS — Z1211 Encounter for screening for malignant neoplasm of colon: Secondary | ICD-10-CM

## 2022-12-02 NOTE — Progress Notes (Signed)
MEDICARE ANNUAL WELLNESS VISIT  12/02/2022  Telephone Visit Disclaimer This Medicare AWV was conducted by telephone due to national recommendations for restrictions regarding the COVID-19 Pandemic (e.g. social distancing).  I verified, using two identifiers, that I am speaking with Christian [name] or their authorized healthcare agent. I discussed the limitations, risks, security, and privacy concerns of performing an evaluation and management service by telephone and the potential availability of an in-person appointment in the future. The patient expressed understanding and agreed to proceed.  Location of Patient: Home Location of Provider (nurse):  In the office.  Subjective:    Christian [name] is a 72 y.o. male patient of Thekkekandam, [name], MD who had a Medicare Annual Wellness Visit today via telephone. Christian [name] is Retired and lives with their spouse. he has 3 children. he reports that he is socially active and does interact with friends/family regularly. he is minimally physically active and enjoys photography and computer.  Patient Care Team: Monica Becton, MD as PCP - General (Sports Medicine)     12/02/2022   10:48 AM 11/29/2021    9:48 AM 06/01/2021    2:51 PM 10/25/2019   10:47 AM 08/31/2017    2:03 PM 05/26/2015    8:08 AM 05/01/2015   11:09 AM  Advanced Directives  Does Patient Have a Medical Advance Directive? No No No No No No No  Would patient like information on creating a medical advance directive? No - Patient declined No - Patient declined Yes (MAU/Ambulatory/Procedural Areas - Information given) Yes (MAU/Ambulatory/Procedural Areas - Information given) Yes (MAU/Ambulatory/Procedural Areas - Information given) Yes - Educational materials given No - patient declined information    Hospital Utilization Over the Past 12 Months: # of hospitalizations or ER visits: 0 # of surgeries: 0  Review of Systems    Patient reports that his overall health is  unchanged compared to last year.  History obtained from chart review and the patient  Patient Reported Readings (BP, Pulse, CBG, Weight, etc) none  Pain Assessment Pain : 0-10 Pain Score: 8  Pain Type: Acute pain, Other (Comment) (post procedure) Pain Location: Leg Pain Orientation: Left Pain Descriptors / Indicators: Aching Pain Onset: Yesterday Pain Frequency: Intermittent Pain Relieving Factors: Tylenol  Pain Relieving Factors: Tylenol  Current Medications & Allergies (verified) Allergies as of 12/02/2022       Reactions   Penicillins    REACTION: swelling, rash        Medication List        Accurate as of December 02, 2022 11:14 AM. If you have any questions, ask your nurse or doctor.          aspirin 81 MG tablet Take 81 mg by mouth once.   BD Pen Needle Nano 2nd Gen 32G X 4 MM Misc Generic drug: Insulin Pen Needle USE AS DIRECTED 5 TIMES A DAY WITH INSULIN INJECTIONS   bisoprolol 5 MG tablet Commonly known as: ZEBETA Take 1 PO qd x 1 week, then 1/2 PO qd x 1 week, then 1/2 PO qod x 1 week, then stop   diclofenac 75 MG EC tablet Commonly known as: VOLTAREN Take 1 tablet (75 mg total) by mouth 2 (two) times daily. With omeprazole   FreeStyle Libre 2 Reader Devi 1 applicator by Does not apply route every 14 (fourteen) days. E11.42   FreeStyle Libre 2 Sensor Misc 1 each by Does not apply route every 14 (fourteen) days.   HumuLIN R U-500 KwikPen 500  UNIT/ML KwikPen Generic drug: insulin regular human CONCENTRATED INJECT 200 UNITS UNDER SKIN A DAY AS ADVISED   hydrochlorothiazide 12.5 MG tablet Commonly known as: HYDRODIURIL TAKE 1/2 TO 1 TABLET BY MOUTH EVERY DAY AS NEEDED FOR EDEMA   metFORMIN 1000 MG tablet Commonly known as: GLUCOPHAGE TAKE 1 TABLET BY MOUTH TWICE  DAILY WITH A MEAL   montelukast 10 MG tablet Commonly known as: SINGULAIR TAKE 1 TABLET BY MOUTH AT BEDTIME AS NEEDED.   omeprazole 20 MG capsule Commonly known as:  PRILOSEC Take 1 capsule (20 mg total) by mouth daily. With diclofenac        History (reviewed): Past Medical History:  Diagnosis Date   Chronic back pain 09/13/2013   Chronic pain syndrome 09/13/2013   GERD (gastroesophageal reflux disease)    hx. of   Gout 09/13/2013   Hemorrhoids    Hypertension    Morbid obesity with body mass index of 45.0-49.9 in adult [facility]) 09/13/2013   Osteoarthritis 09/13/2013   Pneumonia    hx. 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   Procedure: RIGHT TOTAL KNEE ARTHROPLASTY;  Surgeon: Mcarthur Rossetti, MD;  Location: [facility];  Service: Orthopedics;  Laterality: Right;   WISDOM TOOTH EXTRACTION     Family History  Problem Relation Age of Onset   Alcohol abuse Father    Arthritis Maternal Grandmother    Alcohol abuse Paternal Grandmother    Alcohol abuse Paternal Grandfather    Diabetes Maternal Aunt    Social History   Socioeconomic History   Marital status: Married    Spouse name: Lelan Pons   Number of children: 3   Years of education: 14   Highest education level: Some college, no degree  Occupational History   Occupation: Retired  Tobacco Use   Smoking status: Former    Types: Cigarettes    Quit date: 03/08/2004    Years since quitting: 18.7   Smokeless tobacco: Never  Substance and Sexual Activity   Alcohol use: No    Comment: rare   Drug use: No   Sexual activity: Yes    Partners: Female  Other Topics Concern   Not on file  Social History Narrative   Lives with wife. He has three children. He enjoys Product manager.   Social Determinants of Health   Financial Resource Strain: Low Risk  (12/02/2022)   Overall Financial Resource Strain (CARDIA)    Difficulty of Paying Living Expenses: Not hard at  all  Food Insecurity: No Food Insecurity (12/02/2022)   Hunger Vital Sign    Worried About Running Out of Food in the Last Year: Never true    Ran Out of Food in the Last Year: Never true  Transportation Needs: No Transportation Needs (12/02/2022)   PRAPARE - Hydrologist (Medical): No    Lack of Transportation (Non-Medical): No  Physical Activity: Inactive (12/02/2022)   Exercise Vital Sign    Days of Exercise per Week: 0 days    Minutes of Exercise per Session: 0 min  Stress: No Stress Concern Present (12/02/2022)   [place]    Feeling of Stress : Not at  all  Social Connections: Moderately Isolated (12/02/2022)   Social Connection and Isolation Panel [NHANES]    Frequency of Communication with Friends and Family: Twice a week    Frequency of Social Gatherings with Friends and Family: More than three times a week    Attends Religious Services: Never    Marine scientist or Organizations: No    Attends Archivist Meetings: Never    Marital Status: Married    Activities of Daily Living    12/02/2022   10:53 AM  In your present state of health, do you have any difficulty performing the following activities:  Hearing? 1  Vision? 0  Difficulty concentrating or making decisions? 0  Walking or climbing stairs? 1  Comment has to use a electronic department  Dressing or bathing? 1  Comment his wife has to help get in the tub  Doing errands, shopping? 1  Comment usuallly goes with his wife.  Preparing Food and eating ? N  Using the Toilet? N  In the past six months, have you accidently leaked urine? N  Do you have problems with loss of bowel control? N  Managing your Medications? N  Managing your Finances? N  Housekeeping or managing your Housekeeping? N    Patient Education/ Literacy How often do you need to have someone help you when you read instructions, pamphlets, or  other written materials from your doctor or pharmacy?: 1 - Never What is the last grade level you completed in school?: some college  Exercise Current Exercise Habits: The patient does not participate in regular exercise at present, Exercise limited by: orthopedic condition(s)  Diet Patient reports consuming 2 meals a day and 2 snack(s) a day Patient reports that his primary diet is: Regular Patient reports that she does have regular access to food.   Depression Screen    12/02/2022   10:51 AM 11/29/2021    9:41 AM 06/01/2021    2:51 PM 10/25/2019   10:46 AM 08/31/2017    2:04 PM 12/21/2015   10:28 AM  PHQ 2/9 Scores  PHQ - 2 Score 0 0 0 0 0 0  PHQ- 9 Score   0  0      Fall Risk    12/02/2022   10:49 AM 11/29/2021    9:40 AM 06/01/2021    3:29 PM 10/27/2019    7:10 AM 10/25/2019   10:46 AM  Fall Risk   Falls in the past year? 0 1 1 0 0  Number falls in past yr: 0 1 1    Injury with Fall? 0 0 0    Risk for fall due to : History of fall(s) History of fall(s) Impaired balance/gait    Follow up Falls prevention discussed;Education provided;Falls evaluation completed Falls evaluation completed;Education provided;Falls prevention discussed Education provided       Objective:  Christian [name] seemed alert and oriented and he participated appropriately during our telephone visit.  Blood Pressure Weight BMI  BP Readings from Last 3 Encounters:  10/03/22 128/80  05/24/22 132/76  01/17/22 128/72   Wt Readings from Last 3 Encounters:  10/03/22 (!) 351 lb (159.2 kg)  09/19/22 (!) 350 lb (158.8 kg)  05/24/22 (!) 347 lb 3.2 oz (157.5 kg)   BMI Readings from Last 1 Encounters:  10/03/22 55.80 kg/m    *Unable to obtain current vital signs, weight, and BMI due to telephone visit type  Hearing/Vision  Christian [name] did not seem to have difficulty with hearing/understanding  during the telephone conversation Reports that he has not had a formal eye exam by an eye care professional within  the past year Reports that he has not had a formal hearing evaluation within the past year *Unable to fully assess hearing and vision during telephone visit type  Cognitive Function:    12/02/2022   11:07 AM 11/29/2021    9:57 AM  6CIT Screen  What Year? 0 points 0 points  What month? 0 points 0 points  What time? 0 points 0 points  Count back from 20 0 points 0 points  Months in reverse 0 points 0 points  Repeat phrase 4 points 0 points  Total Score 4 points 0 points   (Normal:0-7, Significant for Dysfunction: >8)  Normal Cognitive Function Screening: Yes   Immunization & Health Maintenance Record Immunization History  Administered Date(s) Administered   Moderna Sars-Covid-2 Vaccination 12/03/2019, 12/31/2019, 10/28/2020   PPD Test 05/06/2015    Health Maintenance  Topic Date Due   DTaP/Tdap/Td (1 - Tdap) Never done   OPHTHALMOLOGY EXAM  12/02/2022 (Originally 06/05/2018)   COVID-19 Vaccine (4 - 2023-24 season) 12/18/2022 (Originally 07/08/2022)   INFLUENZA VACCINE  02/05/2023 (Originally 06/07/2022)   Zoster Vaccines- Shingrix (1 of 2) 03/03/2023 (Originally 10/12/2000)   Pneumonia Vaccine 61+ Years old (1 - PCV) 12/03/2023 (Originally 10/12/1957)   COLONOSCOPY (Pts 45-41yrs Insurance coverage will need to be confirmed)  12/03/2023 (Originally 01/16/2020)   HEMOGLOBIN A1C  04/03/2023   Diabetic kidney evaluation - eGFR measurement  05/25/2023   Diabetic kidney evaluation - Urine ACR  05/25/2023   FOOT EXAM  05/25/2023   Medicare Annual Wellness (AWV)  12/03/2023   Hepatitis C Screening  Completed   HPV VACCINES  Aged Out       Assessment  This is a routine wellness examination for [employer].  Health Maintenance: Due or Overdue Health Maintenance Due  Topic Date Due   DTaP/Tdap/Td (1 - Tdap) Never done    Christian [name] does not need a referral for Community Assistance: Care Management:   no Social Work:    no Prescription  Assistance:  no Nutrition/Diabetes Education:  no   Plan:  Personalized Goals  Goals Addressed               This Visit's Progress     Patient Stated (pt-stated)        He stated that he bought a bike and he would like to loose weight.       Personalized Health Maintenance & Screening Recommendations  Pneumococcal vaccine  Influenza vaccine Td vaccine Colorectal cancer screening Shingrix vaccine Diabetic eye exam  Lung Cancer Screening Recommended: no (Low Dose CT Chest recommended if Age 63-80 years, 30 pack-year currently smoking OR have quit w/in past 15 years) Hepatitis C Screening recommended: no HIV Screening recommended: no  Advanced Directives: Written information was not prepared per patient's request.  Referrals & Orders Orders Placed This Encounter  Procedures   Ambulatory referral to Gastroenterology (for Colonoscopy)    Follow-up Plan Follow-up with Monica Becton, MD as planned Discussed with patient about the vaccines and how to schedule them. Medicare wellness visit in one year.  Patient will access AVS on my chart.   I have personally reviewed and noted the following in the patient's chart:   Medical and social history Use of alcohol, tobacco or illicit drugs  Current medications and supplements Functional ability and status Nutritional status Physical activity Advanced directives List of other  physicians Hospitalizations, surgeries, and ER visits in previous 12 months Vitals Screenings to include cognitive, depression, and falls Referrals and appointments  In addition, I have reviewed and discussed with Christian [name] certain preventive protocols, quality metrics, and best practice recommendations. A written personalized care plan for preventive services as well as general preventive health recommendations is available and can be mailed to the patient at his request.      Modesto Charon, RN BSN  12/02/2022

## 2022-12-02 NOTE — Patient Instructions (Addendum)
[place] Maintenance Summary and Written Plan of Care  [name] ,  Thank you for allowing me to perform your Medicare Annual Wellness Visit and for your ongoing commitment to your health.   Health Maintenance & Immunization History Health Maintenance  Topic Date Due   DTaP/Tdap/Td (1 - Tdap) Never done   OPHTHALMOLOGY EXAM  12/02/2022 (Originally 06/05/2018)   COVID-19 Vaccine (4 - 2023-24 season) 12/18/2022 (Originally 07/08/2022)   INFLUENZA VACCINE  02/05/2023 (Originally 06/07/2022)   Zoster Vaccines- Shingrix (1 of 2) 03/03/2023 (Originally 10/12/2000)   Pneumonia Vaccine 23+ Years old (1 - PCV) 12/03/2023 (Originally 10/12/1957)   COLONOSCOPY (Pts 45-61yrs Insurance coverage will need to be confirmed)  12/03/2023 (Originally 01/16/2020)   HEMOGLOBIN A1C  04/03/2023   Diabetic kidney evaluation - eGFR measurement  05/25/2023   Diabetic kidney evaluation - Urine ACR  05/25/2023   FOOT EXAM  05/25/2023   Medicare Annual Wellness (AWV)  12/03/2023   Hepatitis C Screening  Completed   HPV VACCINES  Aged Out   Immunization History  Administered Date(s) Administered   [employer] Vaccination 12/03/2019, 12/31/2019, 10/28/2020   PPD Test 05/06/2015    These are the patient goals that we discussed:  Goals Addressed               This Visit's Progress     Patient Stated (pt-stated)        He stated that he bought a bike and he would like to loose weight.         This is a list of Health Maintenance Items that are overdue or due now: Health Maintenance Due  Topic Date Due   DTaP/Tdap/Td (1 - Tdap) Never done   Pneumococcal vaccine  Influenza vaccine Td vaccine Colorectal cancer screening Shingrix vaccine Diabetic eye exam  Orders/Referrals Placed Today: Orders Placed This Encounter  Procedures   Ambulatory referral to Gastroenterology (for Colonoscopy)    Referral Priority:   Routine    Referral Type:   Consultation     Referral Reason:   Specialty Services Required    Number of Visits Requested:   1   (Contact our referral department at [phone] if you have not spoken with someone about your referral appointment within the next 5 days)    Follow-up Plan Follow-up with Silverio Decamp, MD as planned Discussed with patient about the vaccines and how to schedule them. Medicare wellness visit in one year.  Patient will access AVS on my chart.      Health Maintenance, Male Adopting a healthy lifestyle and getting preventive care are important in promoting health and wellness. Ask your health care provider about: The right schedule for you to have regular tests and exams. Things you can do on your own to prevent diseases and keep yourself healthy. What should I know about diet, weight, and exercise? Eat a healthy diet  Eat a diet that includes plenty of vegetables, fruits, low-fat dairy products, and lean protein. Do not eat a lot of foods that are high in solid fats, added sugars, or sodium. Maintain a healthy weight Body mass index (BMI) is a measurement that can be used to identify possible weight problems. It estimates body fat based on height and weight. Your health care provider can help determine your BMI and help you achieve or maintain a healthy weight. Get regular exercise Get regular exercise. This is one of the most important things you can do for your health. Most adults should:  Exercise for at least 150 minutes each week. The exercise should increase your heart rate and make you sweat (moderate-intensity exercise). Do strengthening exercises at least twice a week. This is in addition to the moderate-intensity exercise. Spend less time sitting. Even light physical activity can be beneficial. Watch cholesterol and blood lipids Have your blood tested for lipids and cholesterol at 73 years of age, then have this test every 5 years. You may need to have your cholesterol levels checked  more often if: Your lipid or cholesterol levels are high. You are older than 73 years of age. You are at high risk for heart disease. What should I know about cancer screening? Many types of cancers can be detected early and may often be prevented. Depending on your health history and family history, you may need to have cancer screening at various ages. This may include screening for: Colorectal cancer. Prostate cancer. Skin cancer. Lung cancer. What should I know about heart disease, diabetes, and high blood pressure? Blood pressure and heart disease High blood pressure causes heart disease and increases the risk of stroke. This is more likely to develop in people who have high blood pressure readings or are overweight. Talk with your health care provider about your target blood pressure readings. Have your blood pressure checked: Every 3-5 years if you are 36-81 years of age. Every year if you are 2 years old or older. If you are between the ages of 68 and 55 and are a current or former smoker, ask your health care provider if you should have a one-time screening for abdominal aortic aneurysm (AAA). Diabetes Have regular diabetes screenings. This checks your fasting blood sugar level. Have the screening done: Once every three years after age 60 if you are at a normal weight and have a low risk for diabetes. More often and at a younger age if you are overweight or have a high risk for diabetes. What should I know about preventing infection? Hepatitis B If you have a higher risk for hepatitis B, you should be screened for this virus. Talk with your health care provider to find out if you are at risk for hepatitis B infection. Hepatitis C Blood testing is recommended for: Everyone born from [birth year] through 1966. Anyone with known risk factors for hepatitis C. Sexually transmitted infections (STIs) You should be screened each year for STIs, including gonorrhea and chlamydia, if: You are  sexually active and are younger than 73 years of age. You are older than 73 years of age and your health care provider tells you that you are at risk for this type of infection. Your sexual activity has changed since you were last screened, and you are at increased risk for chlamydia or gonorrhea. Ask your health care provider if you are at risk. Ask your health care provider about whether you are at high risk for HIV. Your health care provider may recommend a prescription medicine to help prevent HIV infection. If you choose to take medicine to prevent HIV, you should first get tested for HIV. You should then be tested every 3 months for as long as you are taking the medicine. Follow these instructions at home: Alcohol use Do not drink alcohol if your health care provider tells you not to drink. If you drink alcohol: Limit how much you have to 0-2 drinks a day. Know how much alcohol is in your drink. In the [place] one drink equals one 12 oz bottle of beer (355 mL), one  5 oz glass of wine (148 mL), or one 1 oz glass of hard liquor (44 mL). Lifestyle Do not use any products that contain nicotine or tobacco. These products include cigarettes, chewing tobacco, and vaping devices, such as e-cigarettes. If you need help quitting, ask your health care provider. Do not use street drugs. Do not share needles. Ask your health care provider for help if you need support or information about quitting drugs. General instructions Schedule regular health, dental, and eye exams. Stay current with your vaccines. Tell your health care provider if: You often feel depressed. You have ever been abused or do not feel safe at home. Summary Adopting a healthy lifestyle and getting preventive care are important in promoting health and wellness. Follow your health care provider's instructions about healthy diet, exercising, and getting tested or screened for diseases. Follow your health care provider's instructions on  monitoring your cholesterol and blood pressure. This information is not intended to replace advice given to you by your health care provider. Make sure you discuss any questions you have with your health care provider. Document Revised: 03/15/2021 Document Reviewed: 03/15/2021 Elsevier Patient Education  2023 [employer].

## 2022-12-05 ENCOUNTER — Telehealth: Payer: Self-pay

## 2022-12-05 ENCOUNTER — Other Ambulatory Visit: Payer: Self-pay

## 2022-12-05 DIAGNOSIS — Z1211 Encounter for screening for malignant neoplasm of colon: Secondary | ICD-10-CM

## 2022-12-05 MED ORDER — NA SULFATE-K SULFATE-MG SULF 17.5-3.13-1.6 GM/177ML PO SOLN: 1.0000 | Freq: Once | ORAL | 0 refills | Status: AC

## 2022-12-05 NOTE — Telephone Encounter (Signed)
Gastroenterology Pre-Procedure Review  Request Date: 12/15/22 Requesting Physician: Dr. Vicente Males  PATIENT REVIEW QUESTIONS: The patient responded to the following health history questions as indicated:    1. Are you having any GI issues? no 2. Do you have a personal history of Polyps? no 3. Do you have a family history of Colon Cancer or Polyps? no 4. Diabetes Mellitus? yes (Pt has been advised to take half of his usual dose of Humulin R, stop Metformin 12/13/22 noted on instructions) 5. Joint replacements in the past 12 months?no 6. Major health problems in the past 3 months?no 7. Any artificial heart valves, MVP, or defibrillator?no    MEDICATIONS & ALLERGIES:    Patient reports the following regarding taking any anticoagulation/antiplatelet therapy:   Plavix, Coumadin, Eliquis, Xarelto, Lovenox, Pradaxa, Brilinta, or Effient? no Aspirin? no  Patient confirms/reports the following medications:  Current Outpatient Medications  Medication Sig Dispense Refill   aspirin 81 MG tablet Take 81 mg by mouth once.     BD PEN NEEDLE NANO 2ND GEN 32G X 4 MM MISC USE AS DIRECTED 5 TIMES A DAY WITH INSULIN INJECTIONS 200 each 5   bisoprolol (ZEBETA) 5 MG tablet Take 1 PO qd x 1 week, then 1/2 PO qd x 1 week, then 1/2 PO qod x 1 week, then stop (Patient not taking: Reported on 12/02/2022) 30 tablet 0   Continuous Blood Gluc Receiver (FREESTYLE LIBRE 2 READER) DEVI 1 applicator by Does not apply route every 14 (fourteen) days. E11.42 2 each 3   Continuous Blood Gluc Sensor (FREESTYLE LIBRE 2 SENSOR) MISC 1 each by Does not apply route every 14 (fourteen) days. 6 each 3   diclofenac (VOLTAREN) 75 MG EC tablet Take 1 tablet (75 mg total) by mouth 2 (two) times daily. With omeprazole 180 tablet 3   HUMULIN R U-500 KWIKPEN 500 UNIT/ML KwikPen INJECT 200 UNITS UNDER SKIN A DAY AS ADVISED 24 mL 3   hydrochlorothiazide (HYDRODIURIL) 12.5 MG tablet TAKE 1/2 TO 1 TABLET BY MOUTH EVERY DAY AS NEEDED FOR EDEMA 90  tablet 2   metFORMIN (GLUCOPHAGE) 1000 MG tablet TAKE 1 TABLET BY MOUTH TWICE  DAILY WITH A MEAL 180 tablet 3   montelukast (SINGULAIR) 10 MG tablet TAKE 1 TABLET BY MOUTH AT BEDTIME AS NEEDED. 90 tablet 2   omeprazole (PRILOSEC) 20 MG capsule Take 1 capsule (20 mg total) by mouth daily. With diclofenac 90 capsule 3   No current facility-administered medications for this visit.    Patient confirms/reports the following allergies:  Allergies  Allergen Reactions   Penicillins     REACTION: swelling, rash    No orders of the defined types were placed in this encounter.   AUTHORIZATION INFORMATION Primary Insurance: 1D#: Group #:  Secondary Insurance: 1D#: Group #:  SCHEDULE INFORMATION: Date: 12/15/22 Time: Location: [facility]

## 2022-12-07 LAB — HM DIABETES EYE EXAM

## 2022-12-08 DIAGNOSIS — I83891 Varicose veins of right lower extremities with other complications: Secondary | ICD-10-CM | POA: Diagnosis not present

## 2022-12-15 ENCOUNTER — Encounter
Admission: RE | Disposition: A | Discharge status: Home / Self Care | Payer: Self-pay | Source: Home / Self Care | Attending: Gastroenterology

## 2022-12-15 ENCOUNTER — Ambulatory Visit: Payer: Medicare Other | Admitting: Anesthesiology

## 2022-12-15 ENCOUNTER — Encounter: Payer: Self-pay | Admitting: Gastroenterology

## 2022-12-15 ENCOUNTER — Ambulatory Visit
Admission: RE | Admit: 2022-12-15 | Discharge: 2022-12-15 | Disposition: A | Discharge status: Home / Self Care | Payer: Medicare Other | Attending: Gastroenterology | Admitting: Gastroenterology

## 2022-12-15 ENCOUNTER — Encounter: Payer: Self-pay | Admitting: Sports Medicine

## 2022-12-15 DIAGNOSIS — G40909 Epilepsy, unspecified, not intractable, without status epilepticus: Secondary | ICD-10-CM | POA: Insufficient documentation

## 2022-12-15 DIAGNOSIS — G4730 Sleep apnea, unspecified: Secondary | ICD-10-CM | POA: Diagnosis not present

## 2022-12-15 DIAGNOSIS — K5730 Diverticulosis of large intestine without perforation or abscess without bleeding: Secondary | ICD-10-CM | POA: Insufficient documentation

## 2022-12-15 DIAGNOSIS — Z6841 Body Mass Index (BMI) 40.0 and over, adult: Secondary | ICD-10-CM | POA: Diagnosis not present

## 2022-12-15 DIAGNOSIS — K219 Gastro-esophageal reflux disease without esophagitis: Secondary | ICD-10-CM | POA: Insufficient documentation

## 2022-12-15 DIAGNOSIS — E1065 Type 1 diabetes mellitus with hyperglycemia: Secondary | ICD-10-CM | POA: Insufficient documentation

## 2022-12-15 DIAGNOSIS — Z1211 Encounter for screening for malignant neoplasm of colon: Secondary | ICD-10-CM | POA: Insufficient documentation

## 2022-12-15 DIAGNOSIS — K5790 Diverticulosis of intestine, part unspecified, without perforation or abscess without bleeding: Secondary | ICD-10-CM | POA: Diagnosis not present

## 2022-12-15 DIAGNOSIS — Z87891 Personal history of nicotine dependence: Secondary | ICD-10-CM | POA: Diagnosis not present

## 2022-12-15 DIAGNOSIS — I10 Essential (primary) hypertension: Secondary | ICD-10-CM | POA: Diagnosis not present

## 2022-12-15 DIAGNOSIS — Z0000 Encounter for general adult medical examination without abnormal findings: Secondary | ICD-10-CM

## 2022-12-15 HISTORY — PX: COLONOSCOPY WITH PROPOFOL: SHX5780

## 2022-12-15 LAB — GLUCOSE, CAPILLARY: Glucose-Capillary: 96 mg/dL (ref 70–99)

## 2022-12-15 SURGERY — COLONOSCOPY WITH PROPOFOL
Anesthesia: General

## 2022-12-15 MED ORDER — PROPOFOL 500 MG/50ML IV EMUL
INTRAVENOUS | Status: DC | PRN
  Administered 2022-12-15: 150 ug/kg/min via INTRAVENOUS

## 2022-12-15 MED ORDER — SODIUM CHLORIDE 0.9 % IV SOLN
INTRAVENOUS | Status: DC
  Administered 2022-12-15: 1000 mL via INTRAVENOUS

## 2022-12-15 MED ORDER — LIDOCAINE HCL (CARDIAC) PF 100 MG/5ML IV SOSY
PREFILLED_SYRINGE | INTRAVENOUS | Status: DC | PRN
  Administered 2022-12-15: 40 mg via INTRAVENOUS

## 2022-12-15 MED ORDER — DEXMEDETOMIDINE HCL IN NACL 200 MCG/50ML IV SOLN
INTRAVENOUS | Status: DC | PRN
  Administered 2022-12-15: 8 ug via INTRAVENOUS

## 2022-12-15 MED ORDER — PHENYLEPHRINE HCL (PRESSORS) 10 MG/ML IV SOLN
INTRAVENOUS | Status: DC | PRN
  Administered 2022-12-15 (×2): 160 ug via INTRAVENOUS

## 2022-12-15 MED ORDER — PROPOFOL 10 MG/ML IV BOLUS
INTRAVENOUS | Status: DC | PRN
  Administered 2022-12-15: 80 mg via INTRAVENOUS

## 2022-12-15 MED ORDER — PROPOFOL 1000 MG/100ML IV EMUL
INTRAVENOUS | Status: AC
  Filled 2022-12-15: qty 100

## 2022-12-15 NOTE — Anesthesia Postprocedure Evaluation (Signed)
Anesthesia Post Note  Patient: Christian [name]  Procedure(s) Performed: COLONOSCOPY WITH PROPOFOL  Patient location during evaluation: Endoscopy Anesthesia Type: General Level of consciousness: awake and alert Pain management: pain level controlled Vital Signs Assessment: post-procedure vital signs reviewed and stable Respiratory status: spontaneous breathing, nonlabored ventilation and respiratory function stable Cardiovascular status: blood pressure returned to baseline and stable Postop Assessment: no apparent nausea or vomiting Anesthetic complications: no   No notable events documented.   Last Vitals:  Vitals:   12/15/22 0954 12/15/22 1004  BP: 101/61   Pulse: 94 91  Resp:    Temp:    SpO2: 100% 100%    Last Pain:  Vitals:   12/15/22 1004  TempSrc:   PainSc: 0-No pain                 Alphonsus Sias

## 2022-12-15 NOTE — Transfer of Care (Signed)
Immediate Anesthesia Transfer of Care Note  Patient: Christian [name]  Procedure(s) Performed: Procedure(s): COLONOSCOPY WITH PROPOFOL (N/A)  Patient Location: PACU and Endoscopy Unit  Anesthesia Type:General  Level of Consciousness: sedated  Airway & Oxygen Therapy: Patient Spontanous Breathing and Patient connected to nasal cannula oxygen  Post-op Assessment: Report given to RN and Post -op Vital signs reviewed and stable  Post vital signs: Reviewed and stable  Last Vitals:  Vitals:   12/15/22 0846 12/15/22 0944  BP: (!) 161/97 (!) 80/54  Pulse: 98 86  Resp: (!) 22 18  Temp: (!) 36.2 C 36.7 C  SpO2: 85% 885%    Complications: No apparent anesthesia complications

## 2022-12-15 NOTE — Anesthesia Procedure Notes (Addendum)
Date/Time: 12/15/2022 9:15 AM  Performed by: Doreen Salvage, CRNAPre-anesthesia Checklist: Patient identified, Emergency Drugs available, Suction available and Patient being monitored Patient Re-evaluated:Patient Re-evaluated prior to induction Oxygen Delivery Method: Supernova nasal CPAP Induction Type: IV induction Dental Injury: Teeth and Oropharynx as per pre-operative assessment  Comments: Nasal cannula with etCO2 monitoring

## 2022-12-15 NOTE — Anesthesia Preprocedure Evaluation (Signed)
Anesthesia Evaluation  Patient identified by MRN, date of birth, ID band Patient awake    Reviewed: Allergy & Precautions, NPO status , Patient's Chart, lab work & pertinent test results  Airway Mallampati: II  TM Distance: >3 FB Neck ROM: full    Dental  (+) Chipped   Pulmonary sleep apnea , pneumonia, resolved, former smoker Doesn't use CPAP regularly, counseled to take precautions tonight post procedure to prevent apnea   Pulmonary exam normal        Cardiovascular hypertension, Normal cardiovascular exam     Neuro/Psych Seizures -, Well Controlled,   Neuromuscular disease  negative psych ROS   GI/Hepatic Neg liver ROS,GERD  Controlled,,  Endo/Other  diabetes  Morbid obesity  Renal/GU negative Renal ROS  negative genitourinary   Musculoskeletal  (+) Arthritis ,    Abdominal   Peds  Hematology negative hematology ROS (+)   Anesthesia Other Findings Past Medical History: 09/13/2013: Chronic back pain 09/13/2013: Chronic pain syndrome No date: GERD (gastroesophageal reflux disease)     Comment:  hx. of 09/13/2013: Gout No date: Hemorrhoids No date: Hypertension 09/13/2013: Morbid obesity with body mass index of 45.0-49.9 in adult  [facility]) 09/13/2013: Osteoarthritis No date: Pneumonia     Comment:  hx. of No date: Seizures ([place]) No date: Sleep apnea     Comment:  has never used C-pap machine due to insurance cost No date: Type 1 diabetes mellitus, uncontrolled  Past Surgical History: No date: broken toe 35 years ago 05/01/2015: [place]; Right     Comment:  Procedure: RIGHT TOTAL KNEE ARTHROPLASTY;  Surgeon:               Mcarthur Rossetti, MD;  Location: [facility] ORS;  Service:               Orthopedics;  Laterality: Right; No date: WISDOM TOOTH EXTRACTION  BMI    Body Mass Index: 54.88 kg/m      Reproductive/Obstetrics negative OB ROS                              Anesthesia Physical Anesthesia Plan  ASA: 4  Anesthesia Plan: General   Post-op Pain Management:    Induction: Intravenous  PONV Risk Score and Plan: Propofol infusion and TIVA  Airway Management Planned: Natural Airway and Nasal Cannula  Additional Equipment:   Intra-op Plan:   Post-operative Plan:   Informed Consent: I have reviewed the patients History and Physical, chart, labs and discussed the procedure including the risks, benefits and alternatives for the proposed anesthesia with the patient or authorized representative who has indicated his/her understanding and acceptance.     Dental Advisory Given  Plan Discussed with: Anesthesiologist, CRNA and Surgeon  Anesthesia Plan Comments: (Patient consented for risks of anesthesia including but not limited to:  - adverse reactions to medications - risk of airway placement if required - damage to eyes, teeth, lips or other oral mucosa - nerve damage due to positioning  - sore throat or hoarseness - Damage to heart, brain, nerves, lungs, other parts of body or loss of life  Patient voiced understanding.)        Anesthesia Quick Evaluation

## 2022-12-15 NOTE — Op Note (Signed)
[facility] Gastroenterology Patient Name: Christian [name] Procedure Date: 12/15/2022 8:42 AM MRN: [id number] Account #: [id number] Date of Birth: May 22, 1951 Admit Type: Outpatient Age: 72 Room: [facility] ENDO ROOM 4 Gender: Male Note Status: Finalized Instrument Name: Jasper Riling [id number] Procedure:             Colonoscopy Indications:           Screening for colorectal malignant neoplasm Providers:             Jonathon Bellows MD, MD Referring MD:          Gwen Her. Dianah Field (Referring MD) Medicines:             Monitored Anesthesia Care Complications:         No immediate complications. Procedure:             Pre-Anesthesia Assessment:                        - Prior to the procedure, a History and Physical was                         performed, and patient medications, allergies and                         sensitivities were reviewed. The patient's tolerance                         of previous anesthesia was reviewed.                        - The risks and benefits of the procedure and the                         sedation options and risks were discussed with the                         patient. All questions were answered and informed                         consent was obtained.                        - ASA Grade Assessment: II - A patient with mild                         systemic disease.                        After obtaining informed consent, the colonoscope was                         passed under direct vision. Throughout the procedure,                         the patient's blood pressure, pulse, and oxygen                         saturations were monitored continuously. The                         Colonoscope was introduced  through the anus and                         advanced to the the cecum, identified by the                         appendiceal orifice. The colonoscopy was performed                         with ease. The patient tolerated the procedure  well.                         The quality of the bowel preparation was excellent.                         The ileocecal valve, appendiceal orifice, and rectum                         were photographed. Findings:      The perianal and digital rectal examinations were normal.      Multiple medium-mouthed diverticula were found in the sigmoid colon.      The exam was otherwise without abnormality on direct and retroflexion       views. Impression:            - Diverticulosis in the sigmoid colon.                        - The examination was otherwise normal on direct and                         retroflexion views.                        - No specimens collected. Recommendation:        - Discharge patient to home (with escort).                        - Resume previous diet.                        - Continue present medications.                        - Repeat colonoscopy in 10 years for screening                         purposes. Procedure Code(s):     --- Professional ---                        [phone], Colonoscopy, flexible; diagnostic, including                         collection of specimen(s) by brushing or washing, when                         performed (separate procedure) Diagnosis Code(s):     --- Professional ---                        Z12.11, Encounter for screening for malignant  neoplasm                         of colon                        K57.30, Diverticulosis of large intestine without                         perforation or abscess without bleeding CPT copyright 2022 American Medical Association. All rights reserved. The codes documented in this report are preliminary and upon coder review may  be revised to meet current compliance requirements. Jonathon Bellows, MD Jonathon Bellows MD, MD 12/15/2022 9:42:39 AM This report has been signed electronically. Number of Addenda: 0 Note Initiated On: 12/15/2022 8:42 AM Scope Withdrawal Time: 0 hours 11 minutes 37 seconds  Total Procedure  Duration: 0 hours 14 minutes 12 seconds  Estimated Blood Loss:  Estimated blood loss: none.      [facility]

## 2022-12-16 ENCOUNTER — Encounter: Payer: Self-pay | Admitting: Gastroenterology

## 2023-01-02 ENCOUNTER — Ambulatory Visit (INDEPENDENT_AMBULATORY_CARE_PROVIDER_SITE_OTHER): Payer: Medicare Other | Admitting: Family Medicine

## 2023-01-02 ENCOUNTER — Encounter (INDEPENDENT_AMBULATORY_CARE_PROVIDER_SITE_OTHER): Payer: Self-pay | Admitting: Family Medicine

## 2023-01-02 VITALS — BP 147/93 | HR 82 | Temp 97.7°F | Ht 65.0 in | Wt 344.0 lb

## 2023-01-02 DIAGNOSIS — Z0289 Encounter for other administrative examinations: Secondary | ICD-10-CM

## 2023-01-02 DIAGNOSIS — E119 Type 2 diabetes mellitus without complications: Secondary | ICD-10-CM | POA: Diagnosis not present

## 2023-01-02 DIAGNOSIS — M1712 Unilateral primary osteoarthritis, left knee: Secondary | ICD-10-CM | POA: Diagnosis not present

## 2023-01-02 DIAGNOSIS — Z794 Long term (current) use of insulin: Secondary | ICD-10-CM

## 2023-01-02 DIAGNOSIS — Z6841 Body Mass Index (BMI) 40.0 and over, adult: Secondary | ICD-10-CM

## 2023-01-02 DIAGNOSIS — I10 Essential (primary) hypertension: Secondary | ICD-10-CM | POA: Diagnosis not present

## 2023-01-02 NOTE — Assessment & Plan Note (Signed)
Pt is resistant to his diagnosis of HTN but he is on HCTZ 12.5 mg daily and Bisoprolol 5 mg daily.  His BP was elevated today but he had knee pain walking in here. Look for blood pressure improvements with weight reduction.

## 2023-01-02 NOTE — Assessment & Plan Note (Signed)
Lab Results  Component Value Date   HGBA1C 7.0 (A) 10/03/2022   Patient reports fairly well-controlled type 2 diabetes with an A1c of 7.0 currently on metformin 1000 mg twice daily per endocrinology along with 200 units of Humulin RU 500.  Continue current medications per endocrinology and look for improvements in type 2 diabetes with weight reduction.  Notably, he did not see any weight loss on Trulicity and reports a skin rash while on Ozempic.  We did discuss the role of weight loss surgery for morbid obesity and type 2 diabetes treatment.

## 2023-01-02 NOTE — Progress Notes (Signed)
Office: [phone]  /  Fax: [phone]   Initial Visit  Christian [name] was seen in clinic today to evaluate for obesity. He is interested in losing weight to improve overall health and reduce the risk of weight related complications. He presents today to review program treatment options, initial physical assessment, and evaluation.     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 He states: Improve energy levels and physical activity, Improve existing medical conditions, and Reduce risk for a surgery  When asked how has your weight affected you? He states: Contributed to medical problems and Contributed to orthopedic problems or mobility issues  Some associated conditions: Hypertension, Hyperlipidemia, and Diabetes  Contributing factors: Reduced physical activity + family hx of obesity  Weight promoting medications identified: None  Current nutrition plan: None  Current level of physical activity: Limited due to chronic pain or orthopedic problems  Current or previous pharmacotherapy: None  Response to medication: Never tried medications Reports using Trulicity for DM2- no weight loss Has considered WLS but has an abdominal hernia  Past medical history includes:   Past Medical History:  Diagnosis Date   Chronic back pain 09/13/2013   Chronic pain syndrome 09/13/2013   GERD (gastroesophageal reflux disease)    hx. of   Gout 09/13/2013   Hemorrhoids    Hypertension    Morbid obesity with body mass index of 45.0-49.9 in adult [facility]) 09/13/2013   Osteoarthritis 09/13/2013   Pneumonia    hx. of   Seizures (HCC)    Sleep apnea    has never used C-pap machine due to insurance cost   Type 1 diabetes mellitus, uncontrolled      Objective:   BP (!) 147/93   Pulse 82    Temp 97.7 F (36.5 C)   Ht '5\' 5"'$  (1.651 m)   Wt (!) 344 lb (156 kg)   SpO2 96%   BMI 57.24 kg/m  He was weighed on the bioimpedance scale: Body mass index is 57.24 kg/m.  Peak Weight:344 lb  , Body Fat%:--, Visceral Fat Rating:--, Weight trend over the last 12 months: Increasing  General:  Alert, oriented and cooperative. Patient is in no acute distress.  Respiratory: Normal respiratory effort, no problems with respiration noted  Extremities: Normal range of motion.    Mental Status: Normal mood and affect. Normal behavior. Normal judgment and thought content.   DIAGNOSTIC DATA REVIEWED:  BMET    Component Value Date/Time   NA 140 05/24/2022 1529   K 4.3 05/24/2022 1529   CL 105 05/24/2022 1529   CO2 20 05/24/2022 1529   GLUCOSE 80 05/24/2022 1529   BUN 14 05/24/2022 1529   CREATININE 0.84 05/24/2022 1529   CALCIUM 9.1 05/24/2022 1529   GFRNONAA 84 09/09/2019 1120   GFRAA 97 09/09/2019 1120   Lab Results  Component Value Date   HGBA1C 7.0 (A) 10/03/2022   HGBA1C 9.8 (H) 09/11/2013   No results found for: "INSULIN" CBC    Component Value Date/Time   WBC 11.2 (H) 01/02/2019 1113   RBC 4.03 (L) 01/02/2019 1113   HGB 12.4 (L) 01/02/2019 1113   HCT 37.4 (L) 01/02/2019 1113   PLT 249 01/02/2019 1113   MCV 92.8 01/02/2019 1113   MCH 30.8 01/02/2019  1113   MCHC 33.2 01/02/2019 1113   RDW 12.7 01/02/2019 1113   Iron/TIBC/Ferritin/ %Sat    Component Value Date/Time   IRON 70 01/02/2019 1113   TIBC 397 01/02/2019 1113   FERRITIN 26 01/02/2019 1113   IRONPCTSAT 18 (L) 01/02/2019 1113   Lipid Panel     Component Value Date/Time   CHOL 143 05/24/2022 1529   TRIG 165.0 (H) 05/24/2022 1529   HDL 32.60 (L) 05/24/2022 1529   CHOLHDL 4 05/24/2022 1529   VLDL 33.0 05/24/2022 1529   LDLCALC 77 05/24/2022 1529   LDLCALC 79 07/02/2018 1158   Hepatic Function Panel     Component Value Date/Time   PROT 7.2 05/24/2022 1529   ALBUMIN 4.2 05/14/2021 1113   AST 23  05/24/2022 1529   ALT 26 05/24/2022 1529   ALKPHOS 73 05/14/2021 1113   BILITOT 0.5 05/24/2022 1529   BILIDIR 0.1 08/31/2017 1445      Component Value Date/Time   TSH 2.00 07/02/2018 1158     Assessment and Plan:   Insulin dependent type 2 diabetes mellitus ([place]) Assessment & Plan: Lab Results  Component Value Date   HGBA1C 7.0 (A) 10/03/2022   Patient reports fairly well-controlled type 2 diabetes with an A1c of 7.0 currently on metformin 1000 mg twice daily per endocrinology along with 200 units of Humulin RU 500.  Continue current medications per endocrinology and look for improvements in type 2 diabetes with weight reduction.  Notably, he did not see any weight loss on Trulicity and reports a skin rash while on Ozempic.  We did discuss the role of weight loss surgery for morbid obesity and type 2 diabetes treatment.   Morbid obesity (HCC)  BMI 50.0-59.9, adult ([place])  Primary osteoarthritis of left knee Assessment & Plan: Patient was told that he needs a BMI under 40 in order to have total knee replacement surgery.  His knee pain on the left currently limits his ambulation and he is using a cane.  He is managed by orthopedics.  His lack of exercise will likely make more difficult for patient to see adequate weight loss.   Primary hypertension Assessment & Plan: Pt is resistant to his diagnosis of HTN but he is on HCTZ 12.5 mg daily and Bisoprolol 5 mg daily.  His BP was elevated today but he had knee pain walking in here. Look for blood pressure improvements with weight reduction.         Obesity Treatment / Action Plan:  Patient will work on garnering support from family and friends to begin weight loss journey. Will work on eliminating or reducing the presence of highly palatable, calorie dense foods in the home. Will complete provided nutritional and psychosocial assessment questionnaire before the next appointment. Will be scheduled for indirect calorimetry  to determine resting energy expenditure in a fasting state.  This will allow [place] to create a reduced calorie, high-protein meal plan to promote loss of fat mass while preserving muscle mass. Will think about ideas on how to incorporate physical activity into their daily routine. Was counseled on nutritional approaches to weight loss and benefits of complex carbs and high quality protein as part of nutritional weight management. Was counseled on pharmacotherapy and role as an adjunct in weight management.   Obesity Education Performed Today:  He was weighed on the bioimpedance scale and results were discussed and documented in the synopsis.  We discussed obesity as a disease and the importance of a more detailed evaluation of  all the factors contributing to the disease.  We discussed the importance of long term lifestyle changes which include nutrition, exercise and behavioral modifications as well as the importance of customizing this to his specific health and social needs.  We discussed the benefits of reaching a healthier weight to alleviate the symptoms of existing conditions and reduce the risks of the biomechanical, metabolic and psychological effects of obesity.  EMILIEN BREDOW appears to be in the action stage of change and states they are ready to start intensive lifestyle modifications and behavioral modifications.  30 minutes was spent today on this visit including the above counseling, pre-visit chart review, and post-visit documentation.  Reviewed by clinician on day of visit: allergies, medications, problem list, medical history, surgical history, family history, social history, and previous encounter notes.  Loyal Gambler DO

## 2023-01-02 NOTE — Assessment & Plan Note (Signed)
Patient was told that he needs a BMI under 40 in order to have total knee replacement surgery.  His knee pain on the left currently limits his ambulation and he is using a cane.  He is managed by orthopedics.  His lack of exercise will likely make more difficult for patient to see adequate weight loss.

## 2023-01-12 ENCOUNTER — Encounter: Payer: Self-pay | Admitting: Radiology

## 2023-01-18 ENCOUNTER — Ambulatory Visit (INDEPENDENT_AMBULATORY_CARE_PROVIDER_SITE_OTHER): Payer: Medicare Other | Admitting: Family Medicine

## 2023-01-30 ENCOUNTER — Ambulatory Visit (INDEPENDENT_AMBULATORY_CARE_PROVIDER_SITE_OTHER): Payer: Medicare Other | Admitting: Internal Medicine

## 2023-01-30 ENCOUNTER — Encounter: Payer: Self-pay | Admitting: Internal Medicine

## 2023-01-30 VITALS — BP 138/80 | HR 103

## 2023-01-30 DIAGNOSIS — E1142 Type 2 diabetes mellitus with diabetic polyneuropathy: Secondary | ICD-10-CM

## 2023-01-30 DIAGNOSIS — E1165 Type 2 diabetes mellitus with hyperglycemia: Secondary | ICD-10-CM | POA: Diagnosis not present

## 2023-01-30 DIAGNOSIS — Z6841 Body Mass Index (BMI) 40.0 and over, adult: Secondary | ICD-10-CM | POA: Diagnosis not present

## 2023-01-30 DIAGNOSIS — E1169 Type 2 diabetes mellitus with other specified complication: Secondary | ICD-10-CM

## 2023-01-30 DIAGNOSIS — E785 Hyperlipidemia, unspecified: Secondary | ICD-10-CM

## 2023-01-30 LAB — POCT GLYCOSYLATED HEMOGLOBIN (HGB A1C): Hemoglobin A1C: 6.7 % — AB (ref 4.0–5.6)

## 2023-01-30 NOTE — Patient Instructions (Addendum)
Please  continue: - Metformin 2000 mg with at bedtime - U500 30 min before meals  100 units before b'fast 60 units before lunch 30-40 units before bedtime  Please return in 4 months.

## 2023-01-30 NOTE — Addendum Note (Signed)
Addended by: Elta Guadeloupe on: 01/30/2023 02:47 PM   Modules accepted: Orders

## 2023-01-30 NOTE — Progress Notes (Signed)
Patient ID: [name], male   DOB: Mar 25, 1951, 72 y.o.   MRN: [id number]  HPI Christian [name] is a 72 y.o.-year-old male, returning for f/u for DM2, dx 2009, insulin-dependent since 2014, uncontrolled, with complications (DR, Peripheral neuropathy). Last visit: 4 months ago. He is here with his wife, who is also my patient. [name].    Interim history: No increased urination, blurry vision, nausea, chest pain. He has imbalance and walks with a cane. He has muscle cramps. He continues to have left knee and shoulder pain.  He cannot have total knee replacement due to his high BMI.   Before last visit, he had a steroid injection.  He now also has back pain radiating to his left leg.  He plans to get another steroid injection. He received a Libre sensor - will hav to attach it.  Reviewed HbA1c levels: Lab Results  Component Value Date   HGBA1C 7.0 (A) 10/03/2022   HGBA1C 6.5 (A) 05/24/2022   HGBA1C 7.1 (A) 01/17/2022   HGBA1C 8.4 (A) 09/14/2021   HGBA1C 7.7 (A) 05/14/2021   HGBA1C 7.5 (A) 01/12/2021   HGBA1C 7.8 (A) 09/08/2020   HGBA1C 7.0 (A) 05/14/2020   HGBA1C 7.5 (A) 01/09/2020   HGBA1C 6.9 (A) 09/09/2019   HGBA1C 7.6 (A) 05/08/2019   HGBA1C 8.2 (A) 01/08/2019   HGBA1C 7.7 (A) 09/25/2018   HGBA1C 7.4 (A) 05/25/2018   HGBA1C 9.0 02/22/2018   HGBA1C 9.1 11/23/2017   HGBA1C 9 08/24/2017   HGBA1C 8.4 05/25/2017   HGBA1C 8.8 02/06/2017   HGBA1C 8.8 11/08/2016  08/24/2017: HbA1c 9%  Pt is on: - Metformin 1000 mg 2x a daily >> 2000 mg with dinner >> at bedtime - U500 30 min before meals - started 09/2021: 100 units before b'fast 50 >> 60 units before early dinner 30-40 >> 40 >> 30-40 units at bedtime Stopped Jardiance 25 mg >> but too expensive - in the donut hole; had yeast infections. Glipizide and Invokana were started 03/2014.  He has been on Victoza in the past >> not efficient anymore. We stopped glipizide in 01/2022. Previously on Lantus and Humalog high  doses. He stopped Trulicity 0000000 due to price.  Pt checks his sugars once a day: - am:  74, 90-135 >> 90-146, 230 >> 78, 97-140s, 155, 188 - 2h after b'fast: 157 >> n/c >> 172 >> 120 >> 121, 184 >> n/c - before lunch: 280 >> 125 >> 190-200 >> n/c >> 102 >> n/c - 2h after lunch: 220-230 >> n/c >> 190-220 >> n/c - before dinner: n/c >> 200s >> n/c  >> 150-185 >> 164 >> n/c - 2h after dinner: n/c >> 74 >> n/c - bedtime: 230, 281 >> 180s, 333 >> n/c >> 200, 331 >> n/c - nighttime: n/c >> 65 >> 57 - 1x a mo >> 79 x 1 at night Lowest sugar was  64 >> 74 >> 57 >> 78, he has hypoglycemia awareness in the 90s Highest sugar was 331 >> 184 >> 285 (steroid inj) >> 181  Meter: OneTouch Ultra Mini >> [name]  -No CKD, last BUN/creatinine:  Lab Results  Component Value Date   BUN 14 05/24/2022   CREATININE 0.84 05/24/2022  Not on ACE inhibitor/ARB.  Normal ACR: Lab Results  Component Value Date   MICRALBCREAT 0.6 05/24/2022   MICRALBCREAT 1.1 05/14/2021   MICRALBCREAT 0.7 09/09/2019   MICRALBCREAT 1.1 08/31/2017   MICRALBCREAT 0.8 08/20/2015   MICRALBCREAT 0.3 09/11/2013  Normal GFR levels: Lab Results  Component Value Date   GFRNONAA 84 09/09/2019   GFRNONAA >60 05/02/2015   GFRNONAA >60 04/27/2015   -+ HL; last set of lipids: Lab Results  Component Value Date   CHOL 143 05/24/2022   HDL 32.60 (L) 05/24/2022   LDLCALC 77 05/24/2022   TRIG 165.0 (H) 05/24/2022   CHOLHDL 4 05/24/2022  No statins due to leg cramps.  - last eye exam was 12/07/2022: no D, prev. reportedly + DR  -+ numbness and tingling in his feet.  Last foot exam 05/2022.  He had R TKR in 04/2015.  He has steroid inj in L knee and L shoulder.  Also uses CBD oil liniment and this helps. She has OSA.  ROS: + See HPI  I reviewed pt's medications, allergies, PMH, social hx, family hx, and changes were documented in the history of present illness. Otherwise, unchanged from my initial visit note.  Past  Medical History:  Diagnosis Date   Chronic back pain 09/13/2013   Chronic pain syndrome 09/13/2013   GERD (gastroesophageal reflux disease)    hx. of   Gout 09/13/2013   Hemorrhoids    Hypertension    Morbid obesity with body mass index of 45.0-49.9 in adult [facility]) 09/13/2013   Osteoarthritis 09/13/2013   Pneumonia    hx. of   Seizures ([place])    Sleep apnea    has never used C-pap machine due to insurance cost   Type 1 diabetes mellitus, uncontrolled    Past Surgical History:  Procedure Laterality Date   broken toe 35 years ago     COLONOSCOPY WITH PROPOFOL N/A 12/15/2022   Procedure: COLONOSCOPY WITH PROPOFOL;  Surgeon: Jonathon Bellows, MD;  Location[facility] END[facility]COPY;  Service: Gastroenterology;  Laterality: N/A;   TOTAL KNEE ARTHROPLASTY Right 05/01/2015   Procedure: RIGHT TOTAL KNEE ARTHROPLASTY;  Surgeon: Mcarthur Rossetti, MD;  Location: [facility] ORS;  Service: Orthopedics;  Laterality: Right;   WISDOM TOOTH EXTRACTION     Social History   Socioeconomic History   Marital status: Married    Spouse name: Lelan Pons   Number of children: 3   Years of education: 14   Highest education level: Some college, no degree  Occupational History   Occupation: Retired  Tobacco Use   Smoking status: Former    Types: Cigarettes    Quit date: 03/08/2004    Years since quitting: 18.9   Smokeless tobacco: Never  Vaping Use   Vaping Use: Never used  Substance and Sexual Activity   Alcohol use: No    Comment: rare   Drug use: No   Sexual activity: Yes    Partners: Female  Other Topics Concern   Not on file  Social History Narrative   Lives with wife. He has three children. He enjoys Product manager.   Social Determinants of Health   Financial Resource Strain: Low Risk  (12/02/2022)   Overall Financial Resource Strain (CARDIA)    Difficulty of Paying Living Expenses: Not hard at all  Food Insecurity: No Food Insecurity (12/02/2022)   Hunger Vital Sign    Worried About Running Out  of Food in the Last Year: Never true    Ran Out of Food in the Last Year: Never true  Transportation Needs: No Transportation Needs (12/02/2022)   PRAPARE - Hydrologist (Medical): No    Lack of Transportation (Non-Medical): No  Physical Activity: Inactive (12/02/2022)   Exercise Vital Sign  Days of Exercise per Week: 0 days    Minutes of Exercise per Session: 0 min  Stress: No Stress Concern Present (12/02/2022)   [place]    Feeling of Stress : Not at all  Social Connections: Moderately Isolated (12/02/2022)   Social Connection and Isolation Panel [NHANES]    Frequency of Communication with Friends and Family: Twice a week    Frequency of Social Gatherings with Friends and Family: More than three times a week    Attends Religious Services: Never    Marine scientist or Organizations: No    Attends Archivist Meetings: Never    Marital Status: Married  Human resources officer Violence: Not At Risk (12/02/2022)   Humiliation, Afraid, Rape, and Kick questionnaire    Fear of Current or Ex-Partner: No    Emotionally Abused: No    Physically Abused: No    Sexually Abused: No   Current Outpatient Medications on File Prior to Visit  Medication Sig Dispense Refill   aspirin 81 MG tablet Take 81 mg by mouth once.     BD PEN NEEDLE NANO 2ND GEN 32G X 4 MM MISC USE AS DIRECTED 5 TIMES A DAY WITH INSULIN INJECTIONS 200 each 5   bisoprolol (ZEBETA) 5 MG tablet Take 1 PO qd x 1 week, then 1/2 PO qd x 1 week, then 1/2 PO qod x 1 week, then stop 30 tablet 0   Continuous Blood Gluc Receiver (FREESTYLE LIBRE 2 READER) DEVI 1 applicator by Does not apply route every 14 (fourteen) days. E11.42 2 each 3   Continuous Blood Gluc Sensor (FREESTYLE LIBRE 2 SENSOR) MISC 1 each by Does not apply route every 14 (fourteen) days. 6 each 3   diclofenac (VOLTAREN) 75 MG EC tablet Take 1 tablet (75 mg total) by  mouth 2 (two) times daily. With omeprazole 180 tablet 3   HUMULIN R U-500 KWIKPEN 500 UNIT/ML KwikPen INJECT 200 UNITS UNDER SKIN A DAY AS ADVISED 24 mL 3   hydrochlorothiazide (HYDRODIURIL) 12.5 MG tablet TAKE 1/2 TO 1 TABLET BY MOUTH EVERY DAY AS NEEDED FOR EDEMA 90 tablet 2   metFORMIN (GLUCOPHAGE) 1000 MG tablet TAKE 1 TABLET BY MOUTH TWICE  DAILY WITH A MEAL 180 tablet 3   montelukast (SINGULAIR) 10 MG tablet TAKE 1 TABLET BY MOUTH AT BEDTIME AS NEEDED. 90 tablet 2   omeprazole (PRILOSEC) 20 MG capsule Take 1 capsule (20 mg total) by mouth daily. With diclofenac 90 capsule 3   No current facility-administered medications on file prior to visit.   Allergies  Allergen Reactions   Penicillins     REACTION: swelling, rash   Family History  Problem Relation Age of Onset   Alcohol abuse Father    Arthritis Maternal Grandmother    Alcohol abuse Paternal Grandmother    Alcohol abuse Paternal Grandfather    Diabetes Maternal Aunt    PE: BP 138/80   Pulse (!) 103   SpO2 96% patient was not weighed today as he could not stand due to back pain   Wt Readings from Last 3 Encounters:  01/02/23 (!) 344 lb (156 kg)  12/15/22 (!) 340 lb (154.2 kg)  10/03/22 (!) 351 lb (159.2 kg)   Constitutional: obese, in NAD, in motorized wheelchair Eyes: EOMI, no exophthalmos ENT: no thyromegaly, no cervical lymphadenopathy Cardiovascular: Tachycardia, RR, No MRG, + LE periankle edema L>R Respiratory: CTA B Musculoskeletal: no deformities Skin:+ stasis dermatitis rash bilateral  lower legs Neurological: no tremor with outstretched hands  ASSESSMENT: 1. DM2, insulin-dependent, uncontrolled, with complications - PN - stable, worse in L foot - DR  2. PN  3.  Obesity class III  PLAN:  1. Patient with history of uncontrolled type 2 diabetes, previously on a complex medication regimen, currently only on metformin at U-500 insulin.  Trulicity was not covered for him and he had to stop it.  Per his  preference, we did not start another GLP-1 receptor agonist.  At last visit, he was checking blood sugars only in the morning and they were at target with only occasional blood sugars above goal.  We discussed that it was imperative that he started checking sugars later in the day.  He was interested in a CGM and I sent a prescription to his pharmacy.  He had some low blood sugars at night in the 50s so we reduced his evening U-500 insulin at last visit.  His HbA1c was higher, at 7.0%. -At today's visit, sugars appear to be mostly at goal in the morning with only occasional mildly hyperglycemic values.  He is not usually checking later in the day.  However, he did check at night once in the last week when he felt low and this was in the upper 70s.  He mentions that this is an isolated incident.  For now, I would not consistently suggest to change his regimen. -He would benefit from attaching his CGM.  He placed it but it came off due to sweating.  He will try this again and secure it with tape - I advised him to: Patient Instructions  Please  continue: - Metformin 2000 mg with at bedtime - U500 30 min before meals  100 units before b'fast 60 units before lunch 30-40 units before bedtime  Please return in 4 months.  - we checked his HbA1c: 6.7% (lower) - advised to check sugars at different times of the day - 4x a day, rotating check times - advised for yearly eye exams >> he is UTD - return to clinic in 4 months  2. HL -Reviewed his lipid panel from 05/2022: LDL slightly higher than goal, triglycerides also slightly high, HDL low: Lab Results  Component Value Date   CHOL 143 05/24/2022   HDL 32.60 (L) 05/24/2022   LDLCALC 77 05/24/2022   TRIG 165.0 (H) 05/24/2022   CHOLHDL 4 05/24/2022  -He did not tolerate statins due to leg cramps.  I did suggest to add ezetimibe, but he did not start  3.  Obesity class III -He saw nutrition 10/2019 -Unfortunately, he had to come off Trulicity due  to price. -His weight usually fluctuates within 4 to 5 pounds -He could not be weighed today  Philemon Kingdom, MD PhD [facility] Endocrinology

## 2023-02-01 ENCOUNTER — Ambulatory Visit (INDEPENDENT_AMBULATORY_CARE_PROVIDER_SITE_OTHER): Payer: Medicare Other | Admitting: Family Medicine

## 2023-02-07 ENCOUNTER — Other Ambulatory Visit (INDEPENDENT_AMBULATORY_CARE_PROVIDER_SITE_OTHER): Payer: Medicare Other

## 2023-02-07 ENCOUNTER — Ambulatory Visit (INDEPENDENT_AMBULATORY_CARE_PROVIDER_SITE_OTHER): Payer: Medicare Other | Admitting: Sports Medicine

## 2023-02-07 DIAGNOSIS — I890 Lymphedema, not elsewhere classified: Secondary | ICD-10-CM

## 2023-02-07 DIAGNOSIS — L97221 Non-pressure chronic ulcer of left calf limited to breakdown of skin: Secondary | ICD-10-CM

## 2023-02-07 DIAGNOSIS — M1712 Unilateral primary osteoarthritis, left knee: Secondary | ICD-10-CM

## 2023-02-07 DIAGNOSIS — L97229 Non-pressure chronic ulcer of left calf with unspecified severity: Secondary | ICD-10-CM | POA: Insufficient documentation

## 2023-02-07 DIAGNOSIS — I83022 Varicose veins of left lower extremity with ulcer of calf: Secondary | ICD-10-CM | POA: Diagnosis not present

## 2023-02-07 NOTE — Progress Notes (Addendum)
    Procedures performed today:    Procedure: Real-time Ultrasound Guided injection of the left knee Device: Samsung HS60  Verbal informed consent obtained.  Time-out conducted.  Noted no overlying erythema, induration, or other signs of local infection.  Skin prepped in a sterile fashion.  Local anesthesia: Topical Ethyl chloride.  With sterile technique and under real time ultrasound guidance: Mild effusion and 1 cc Kenalog 40, 2 cc lidocaine, 2 cc bupivacaine injected easily Completed without difficulty  Advised to call if fevers/chills, erythema, induration, drainage, or persistent bleeding.  Images permanently stored and available for review in PACS.  Impression: Technically successful ultrasound guided injection.  Independent interpretation of notes and tests performed by another provider:   None.  Brief History, Exam, Impression, and Recommendations:    Primary osteoarthritis of left knee 72 year old male known left knee osteoarthritis, BMI over 40 so unable to have TKR, injected today, return to see me in 4 to 6 weeks, [place] if no better.  Venous stasis ulcer, left lower extremity with lymphedema Chronic left lower extremity swelling, ruddy appearance, venous stasis ulcers, explained the pathophysiology and the need for compression, I will set him up with home health nursing for [facility] boot placement once a week.  His lymphedema secondary to venous stasis. Unable to do home health nursing as they are requiring an ABI, I will switch the referral to Center for vein restoration.    ____________________________________________ Gwen Her. Dianah Field, M.D., ABFM., CAQSM., AME. Primary Care and Sports Medicine [place] [facility]  Adjunct Professor of [place] of [facility] of Medicine  Risk manager

## 2023-02-07 NOTE — Assessment & Plan Note (Signed)
72 year old male known left knee osteoarthritis, BMI over 40 so unable to have TKR, injected today, return to see me in 4 to 6 weeks, [place] if no better.

## 2023-02-07 NOTE — Assessment & Plan Note (Addendum)
Chronic left lower extremity swelling, ruddy appearance, venous stasis ulcers, explained the pathophysiology and the need for compression, I will set him up with home health nursing for [facility] boot placement once a week.

## 2023-02-09 NOTE — Addendum Note (Signed)
Addended by: Silverio Decamp on: 02/09/2023 12:57 PM   Modules accepted: Orders

## 2023-02-10 ENCOUNTER — Ambulatory Visit: Payer: Medicare Other | Admitting: Surgical

## 2023-02-27 ENCOUNTER — Encounter: Payer: Self-pay | Admitting: Orthopaedic Surgery

## 2023-03-21 ENCOUNTER — Ambulatory Visit: Payer: Medicare Other | Admitting: Sports Medicine

## 2023-03-24 ENCOUNTER — Encounter: Payer: Self-pay | Admitting: Sports Medicine

## 2023-03-24 ENCOUNTER — Ambulatory Visit (INDEPENDENT_AMBULATORY_CARE_PROVIDER_SITE_OTHER): Payer: Medicare Other | Admitting: Sports Medicine

## 2023-03-24 DIAGNOSIS — M1712 Unilateral primary osteoarthritis, left knee: Secondary | ICD-10-CM | POA: Diagnosis not present

## 2023-03-24 NOTE — Progress Notes (Signed)
    Procedures performed today:    None.  Independent interpretation of notes and tests performed by another provider:   None.  Brief History, Exam, Impression, and Recommendations:    Primary osteoarthritis of left knee This is a very pleasant 72 year old male, he has known end-stage left knee osteoarthritis, bone-on-bone, he has had several steroid injections, viscosupplementation, home physical therapy all without sufficient efficacy, oral analgesics have not been effective. His BMI is over 40 so he is unable to have a TKR. We discussed additional options including genicular RFA and genicular artery embolization, I would like to see if Dr. Elby Showers could do the embolization on this patient. I would also like him to touch base with medical pain management.    ____________________________________________ Christian [name]. [name], M.D., ABFM., CAQSM., AME. Primary Care and Sports Medicine  [facility]  Adjunct Professor of Family Medicine  [place] [employer] [facility] of Medicine  Restaurant manager, fast food

## 2023-03-24 NOTE — Assessment & Plan Note (Addendum)
This is a very pleasant 72 year old male, he has known end-stage left knee osteoarthritis, bone-on-bone, he has had several steroid injections, viscosupplementation, home physical therapy all without sufficient efficacy, oral analgesics have not been effective. His BMI is over 40 so he is unable to have a TKR. We discussed additional options including genicular RFA and genicular artery embolization, I would like to see if Dr. Elby Showers could do the embolization on this patient. I would also like him to touch base with medical pain management.

## 2023-03-29 ENCOUNTER — Other Ambulatory Visit: Payer: Self-pay | Admitting: Interventional Radiology

## 2023-03-29 ENCOUNTER — Ambulatory Visit
Admission: RE | Admit: 2023-03-29 | Discharge: 2023-03-29 | Disposition: A | Discharge status: Home / Self Care | Payer: Medicare Other | Source: Ambulatory Visit | Attending: Sports Medicine | Admitting: Sports Medicine

## 2023-03-29 DIAGNOSIS — E119 Type 2 diabetes mellitus without complications: Secondary | ICD-10-CM | POA: Diagnosis not present

## 2023-03-29 DIAGNOSIS — I872 Venous insufficiency (chronic) (peripheral): Secondary | ICD-10-CM | POA: Diagnosis not present

## 2023-03-29 DIAGNOSIS — R252 Cramp and spasm: Secondary | ICD-10-CM | POA: Diagnosis not present

## 2023-03-29 DIAGNOSIS — Z96651 Presence of right artificial knee joint: Secondary | ICD-10-CM

## 2023-03-29 DIAGNOSIS — M1712 Unilateral primary osteoarthritis, left knee: Secondary | ICD-10-CM

## 2023-03-29 DIAGNOSIS — M25562 Pain in left knee: Secondary | ICD-10-CM | POA: Diagnosis not present

## 2023-03-29 DIAGNOSIS — I83892 Varicose veins of left lower extremities with other complications: Secondary | ICD-10-CM | POA: Diagnosis not present

## 2023-03-29 DIAGNOSIS — R600 Localized edema: Secondary | ICD-10-CM | POA: Diagnosis not present

## 2023-03-29 HISTORY — PX: IR RADIOLOGIST EVAL & MGMT: IMG5224

## 2023-03-29 NOTE — Consult Note (Signed)
Chief Complaint: Patient was seen in virtual telephone consultation today for left knee pain  Referring Physician(s): Thekkekandam,Thomas J  History of Present Illness: Christian [name] is a 72 y.o. male presenting as a gracious referral from Dr. [name] for evaluation for possible geniculate artery embolization.  Mr. Henckel has advanced stage left knee osteoarthritis and has bene under the care of Dr. [name] for several prior left knee steroid injections, viscosupplementation, and has received physical therapy, none of which have been successful at managing knee pain.  He is status post right knee arthroplasty by Dr. Magnus Ivan 7 years ago.  He did well with that, but now endorses right knee pain related to worsening of the left knee pain due to compensation.  He has been managing his pain with Tylenol, which provides minimal relief.  He has a remote history of occasional percocet use.  He is unable to localize the knee pain, describing it as all over.  He reports occasional locking of the knee when walking.    WOMAC pain survey: 84/96 VAS pain survey: 8/10  He has a history of type 2 diabetes, insulin dependent, under the care of Dr. Elvera Lennox.  He has a remote smoking history, stopped 20 years ago.  Blood pressure is well controlled.  No history of cardiac or neurologic vascular event.    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    Past Medical History:  Diagnosis Date   Chronic back pain 09/13/2013   Chronic pain syndrome 09/13/2013   GERD (gastroesophageal reflux disease)    hx. of   Gout 09/13/2013   Hemorrhoids    Hypertension    Morbid obesity with body mass index of 45.0-49.9 in adult [facility]) 09/13/2013   Osteoarthritis 09/13/2013   Pneumonia    hx. of   Seizures ([facility])    Sleep apnea    has never used C-pap machine due to insurance cost   Type 1 diabetes mellitus, uncontrolled     Past Surgical History:  Procedure Laterality Date   broken toe 35  years ago     COLONOSCOPY WITH PROPOFOL N/A 12/15/2022   Procedure: COLONOSCOPY WITH PROPOFOL;  Surgeon: Wyline Mood, MD;  Location: [facility] ENDOSCOPY;  Service: Gastroenterology;  Laterality: N/A;   TOTAL KNEE ARTHROPLASTY Right 05/01/2015   Procedure: RIGHT TOTAL KNEE ARTHROPLASTY;  Surgeon: Kathryne Hitch, MD;  Location: [facility] ORS;  Service: Orthopedics;  Laterality: Right;   WISDOM TOOTH EXTRACTION      Allergies: Penicillins  Medications: Prior to Admission medications   Medication Sig Start Date End Date Taking? Authorizing Provider  aspirin 81 MG tablet Take 81 mg by mouth once.    [provider]  BD PEN NEEDLE NANO 2ND GEN 32G X 4 MM MISC USE AS DIRECTED 5 TIMES A DAY WITH INSULIN INJECTIONS 11/28/22   Carlus Pavlov, MD  bisoprolol (ZEBETA) 5 MG tablet Take 1 PO qd x 1 week, then 1/2 PO qd x 1 week, then 1/2 PO qod x 1 week, then stop 12/22/20   Hilts, Michael, MD  Continuous Blood Gluc Receiver (FREESTYLE LIBRE 2 READER) DEVI 1 applicator by Does not apply route every 14 (fourteen) days. E11.42 10/03/22   Carlus Pavlov, MD  Continuous Blood Gluc Sensor (FREESTYLE LIBRE 2 SENSOR) MISC 1 each by Does not apply route every 14 (fourteen) days. 10/03/22   Carlus Pavlov, MD  diclofenac (VOLTAREN) 75 MG EC tablet Take 1 tablet (75 mg total) by mouth 2 (two) times daily. With omeprazole 05/30/22 05/30/23  Monica Becton, MD  HUMULIN R U-500 KWIKPEN 500 UNIT/ML KwikPen INJECT 200 UNITS UNDER SKIN A DAY AS ADVISED 08/18/22   Carlus Pavlov, MD  hydrochlorothiazide (HYDRODIURIL) 12.5 MG tablet TAKE 1/2 TO 1 TABLET BY MOUTH EVERY DAY AS NEEDED FOR EDEMA 06/14/21   Hilts, Casimiro Needle, MD  metFORMIN (GLUCOPHAGE) 1000 MG tablet TAKE 1 TABLET BY MOUTH TWICE  DAILY WITH A MEAL 07/21/22   Carlus Pavlov, MD  montelukast (SINGULAIR) 10 MG tablet TAKE 1 TABLET BY MOUTH AT BEDTIME AS NEEDED. 02/28/21   Hilts, Casimiro Needle, MD  omeprazole (PRILOSEC) 20 MG capsule Take 1 capsule (20 mg  total) by mouth daily. With diclofenac 05/30/22   Monica Becton, MD     Family History  Problem Relation Age of Onset   Alcohol abuse Father    Arthritis Maternal Grandmother    Alcohol abuse Paternal Grandmother    Alcohol abuse Paternal Grandfather    Diabetes Maternal Aunt     Social History   Socioeconomic History   Marital status: Married    Spouse name: Hilda Lias   Number of children: 3   Years of education: 14   Highest education level: Some college, no degree  Occupational History   Occupation: Retired  Tobacco Use   Smoking status: Former    Types: Cigarettes    Quit date: 03/08/2004    Years since quitting: 19.0   Smokeless tobacco: Never  Vaping Use   Vaping Use: Never used  Substance and Sexual Activity   Alcohol use: No    Comment: rare   Drug use: No   Sexual activity: Yes    Partners: Female  Other Topics Concern   Not on file  Social History Narrative   Lives with wife. He has three children. He enjoys Diplomatic Services operational officer.   Social Determinants of Health   Financial Resource Strain: Low Risk  (12/02/2022)   Overall Financial Resource Strain (CARDIA)    Difficulty of Paying Living Expenses: Not hard at all  Food Insecurity: No Food Insecurity (12/02/2022)   Hunger Vital Sign    Worried About Running Out of Food in the Last Year: Never true    Ran Out of Food in the Last Year: Never true  Transportation Needs: No Transportation Needs (12/02/2022)   PRAPARE - Administrator, Civil Service (Medical): No    Lack of Transportation (Non-Medical): No  Physical Activity: Inactive (12/02/2022)   Exercise Vital Sign    Days of Exercise per Week: 0 days    Minutes of Exercise per Session: 0 min  Stress: No Stress Concern Present (12/02/2022)   [employer] of Occupational Health - Occupational Stress Questionnaire    Feeling of Stress : Not at all  Social Connections: Moderately Isolated (12/02/2022)   Social Connection and  Isolation Panel [NHANES]    Frequency of Communication with Friends and Family: Twice a week    Frequency of Social Gatherings with Friends and Family: More than three times a week    Attends Religious Services: Never    Database administrator or Organizations: No    Attends Banker Meetings: Never    Marital Status: Married     Review of Systems: A 12 point ROS discussed and pertinent positives are indicated in the HPI above.  All other systems are negative.  Vital Signs: There were no vitals taken for this visit.  Advance Care Plan: The advanced care plan/surrogate decision maker was discussed at the  time of visit and documented in the medical record.   No physical examination was performed in lieu of virtual telephone clinic visit.   Imaging: Knee radiograph 09/19/22  Left knee Kellgren and Lawrence grade 4 osteoarthritis.  Labs:  CBC: No results for input(s): "WBC", "HGB", "HCT", "PLT" in the last 8760 hours.  COAGS: No results for input(s): "INR", "APTT" in the last 8760 hours.  BMP: Recent Labs    05/24/22 1529  NA 140  K 4.3  CL 105  CO2 20  GLUCOSE 80  BUN 14  CALCIUM 9.1  CREATININE 0.84    LIVER FUNCTION TESTS: Recent Labs    05/24/22 1529  BILITOT 0.5  AST 23  ALT 26  PROT 7.2    TUMOR MARKERS: No results for input(s): "AFPTM", "CEA", "CA199", "CHROMGRNA" in the last 8760 hours.  Assessment and Plan: 72 year old male with history of advanced left knee osteoarthritis (grade 4, WOMAC 86, VAS 8) who has failed conservative management of left knee pain including OTC pain medication, steroid injection, viscosupplementation, and physical therapy.  He is currently not an operative candidate due to high BMI.    He would be an excellent candidate for left geniculate artery embolization and wishes to proceed with such.    Plan for left geniculate artery embolization with moderate sedation at [facility].    Electronically Signed: Bennie Dallas, MD 03/29/2023, 7:55 AM   I spent a total of  60 Minutes  in virtual telephone clinical consultation, greater than 50% of which was counseling/coordinating care for left knee pain.

## 2023-04-05 DIAGNOSIS — Z79899 Other long term (current) drug therapy: Secondary | ICD-10-CM | POA: Diagnosis not present

## 2023-04-05 DIAGNOSIS — G894 Chronic pain syndrome: Secondary | ICD-10-CM | POA: Diagnosis not present

## 2023-04-06 DIAGNOSIS — E1140 Type 2 diabetes mellitus with diabetic neuropathy, unspecified: Secondary | ICD-10-CM | POA: Diagnosis not present

## 2023-04-06 DIAGNOSIS — G894 Chronic pain syndrome: Secondary | ICD-10-CM | POA: Diagnosis not present

## 2023-04-06 DIAGNOSIS — Z96651 Presence of right artificial knee joint: Secondary | ICD-10-CM | POA: Diagnosis not present

## 2023-04-06 DIAGNOSIS — M170 Bilateral primary osteoarthritis of knee: Secondary | ICD-10-CM | POA: Diagnosis not present

## 2023-04-14 NOTE — Progress Notes (Signed)
Estell Harpin sent to Rema Jasmine, Trixie Deis, RT; Meta Hatchet, RT; [facility] Interventional [facility]iology Procedure Request  Name: Chuck Caban [birth year]  Procedure: GAE - Left  Reason for procedure: Left Knee Pain  Relevant History: In epic  Order Physician: Elby Showers  Office Contact: Me Mcr and bcbs supp- No precert req Thank you

## 2023-04-26 ENCOUNTER — Other Ambulatory Visit: Payer: Self-pay | Admitting: Student

## 2023-04-26 ENCOUNTER — Other Ambulatory Visit: Payer: Self-pay | Admitting: Internal Medicine

## 2023-04-26 DIAGNOSIS — M1712 Unilateral primary osteoarthritis, left knee: Secondary | ICD-10-CM

## 2023-04-27 ENCOUNTER — Ambulatory Visit
Admission: RE | Admit: 2023-04-27 | Discharge: 2023-04-27 | Disposition: A | Discharge status: Home / Self Care | Payer: Medicare Other | Source: Ambulatory Visit | Attending: Interventional Radiology | Admitting: Interventional Radiology

## 2023-04-27 DIAGNOSIS — M1712 Unilateral primary osteoarthritis, left knee: Secondary | ICD-10-CM | POA: Diagnosis not present

## 2023-04-27 DIAGNOSIS — Z794 Long term (current) use of insulin: Secondary | ICD-10-CM | POA: Insufficient documentation

## 2023-04-27 DIAGNOSIS — K219 Gastro-esophageal reflux disease without esophagitis: Secondary | ICD-10-CM | POA: Insufficient documentation

## 2023-04-27 DIAGNOSIS — Z6837 Body mass index (BMI) 37.0-37.9, adult: Secondary | ICD-10-CM | POA: Insufficient documentation

## 2023-04-27 DIAGNOSIS — E109 Type 1 diabetes mellitus without complications: Secondary | ICD-10-CM | POA: Insufficient documentation

## 2023-04-27 DIAGNOSIS — Z87891 Personal history of nicotine dependence: Secondary | ICD-10-CM | POA: Insufficient documentation

## 2023-04-27 DIAGNOSIS — I10 Essential (primary) hypertension: Secondary | ICD-10-CM | POA: Diagnosis not present

## 2023-04-27 DIAGNOSIS — Z7984 Long term (current) use of oral hypoglycemic drugs: Secondary | ICD-10-CM | POA: Diagnosis not present

## 2023-04-27 DIAGNOSIS — G4730 Sleep apnea, unspecified: Secondary | ICD-10-CM | POA: Insufficient documentation

## 2023-04-27 DIAGNOSIS — G894 Chronic pain syndrome: Secondary | ICD-10-CM | POA: Insufficient documentation

## 2023-04-27 DIAGNOSIS — M25562 Pain in left knee: Secondary | ICD-10-CM | POA: Insufficient documentation

## 2023-04-27 DIAGNOSIS — R569 Unspecified convulsions: Secondary | ICD-10-CM | POA: Insufficient documentation

## 2023-04-27 HISTORY — PX: IR EMBO ARTERIAL NOT HEMORR HEMANG INC GUIDE ROADMAPPING: IMG5448

## 2023-04-27 LAB — CBC WITH DIFFERENTIAL/PLATELET
Abs Immature Granulocytes: 0.04 10*3/uL (ref 0.00–0.07)
Basophils Absolute: 0.1 10*3/uL (ref 0.0–0.1)
Basophils Relative: 1 %
Eosinophils Absolute: 0.5 10*3/uL (ref 0.0–0.5)
Eosinophils Relative: 4 %
HCT: 42.8 % (ref 39.0–52.0)
Hemoglobin: 13.7 g/dL (ref 13.0–17.0)
Immature Granulocytes: 0 %
Lymphocytes Relative: 19 %
Lymphs Abs: 2.3 10*3/uL (ref 0.7–4.0)
MCH: 31.1 pg (ref 26.0–34.0)
MCHC: 32 g/dL (ref 30.0–36.0)
MCV: 97.1 fL (ref 80.0–100.0)
Monocytes Absolute: 0.9 10*3/uL (ref 0.1–1.0)
Monocytes Relative: 7 %
Neutro Abs: 8.4 10*3/uL — ABNORMAL HIGH (ref 1.7–7.7)
Neutrophils Relative %: 69 %
Platelets: 245 10*3/uL (ref 150–400)
RBC: 4.41 MIL/uL (ref 4.22–5.81)
RDW: 14.6 % (ref 11.5–15.5)
WBC: 12.1 10*3/uL — ABNORMAL HIGH (ref 4.0–10.5)
nRBC: 0 % (ref 0.0–0.2)

## 2023-04-27 LAB — BASIC METABOLIC PANEL
Anion gap: 11 (ref 5–15)
BUN: 21 mg/dL (ref 8–23)
CO2: 21 mmol/L — ABNORMAL LOW (ref 22–32)
Calcium: 8.9 mg/dL (ref 8.9–10.3)
Chloride: 108 mmol/L (ref 98–111)
Creatinine, Ser: 0.94 mg/dL (ref 0.61–1.24)
GFR, Estimated: >60 mL/min (ref >60)
Glucose, Bld: 130 mg/dL — ABNORMAL HIGH (ref 70–99)
Potassium: 4 mmol/L (ref 3.5–5.1)
Sodium: 140 mmol/L (ref 135–145)

## 2023-04-27 LAB — PROTIME-INR
INR: 1.2 (ref 0.8–1.2)
Prothrombin Time: 15.2 seconds (ref 11.4–15.2)

## 2023-04-27 LAB — GLUCOSE, CAPILLARY
Glucose-Capillary: 124 mg/dL — ABNORMAL HIGH (ref 70–99)
Glucose-Capillary: 180 mg/dL — ABNORMAL HIGH (ref 70–99)

## 2023-04-27 MED ORDER — DEXAMETHASONE SODIUM PHOSPHATE 10 MG/ML IJ SOLN
10.0000 mg | INTRAMUSCULAR | Status: AC
  Administered 2023-04-27: 10 mg via INTRAVENOUS
  Filled 2023-04-27: qty 1

## 2023-04-27 MED ORDER — MIDAZOLAM HCL 2 MG/2ML IJ SOLN
INTRAMUSCULAR | Status: AC | PRN
  Administered 2023-04-27 (×3): .5 mg via INTRAVENOUS
  Administered 2023-04-27: 1 mg via INTRAVENOUS

## 2023-04-27 MED ORDER — FENTANYL CITRATE (PF) 100 MCG/2ML IJ SOLN
INTRAMUSCULAR | Status: AC
  Filled 2023-04-27: qty 2

## 2023-04-27 MED ORDER — METHYLPREDNISOLONE 4 MG PO TBPK: ORAL_TABLET | ORAL | 0 refills | Status: AC

## 2023-04-27 MED ORDER — MIDAZOLAM HCL 2 MG/2ML IJ SOLN
INTRAMUSCULAR | Status: AC
  Filled 2023-04-27: qty 2

## 2023-04-27 MED ORDER — FENTANYL CITRATE (PF) 100 MCG/2ML IJ SOLN
INTRAMUSCULAR | Status: AC | PRN
  Administered 2023-04-27 (×2): 25 ug via INTRAVENOUS
  Administered 2023-04-27: 50 ug via INTRAVENOUS
  Administered 2023-04-27: 25 ug via INTRAVENOUS

## 2023-04-27 MED ORDER — LIDOCAINE HCL 1 % IJ SOLN
INTRAMUSCULAR | Status: AC
  Filled 2023-04-27: qty 20

## 2023-04-27 MED ORDER — IOHEXOL 300 MG/ML  SOLN
160.0000 mL | Freq: Once | INTRAMUSCULAR | Status: AC | PRN
  Administered 2023-04-27: 160 mL via INTRAVENOUS

## 2023-04-27 MED ORDER — DIPHENHYDRAMINE HCL 50 MG/ML IJ SOLN
INTRAMUSCULAR | Status: AC | PRN
  Administered 2023-04-27: 25 mg via INTRAVENOUS

## 2023-04-27 MED ORDER — NITROGLYCERIN 1 MG/10 ML FOR IR/CATH LAB
INTRA_ARTERIAL | Status: AC | PRN
  Administered 2023-04-27: 100 ug via INTRA_ARTERIAL

## 2023-04-27 MED ORDER — KETOROLAC TROMETHAMINE 30 MG/ML IJ SOLN
30.0000 mg | INTRAMUSCULAR | Status: AC
  Administered 2023-04-27: 30 mg via INTRAVENOUS
  Filled 2023-04-27: qty 1

## 2023-04-27 MED ORDER — ACETAMINOPHEN 10 MG/ML IV SOLN
1000.0000 mg | INTRAVENOUS | Status: AC
  Administered 2023-04-27: 1000 mg via INTRAVENOUS
  Filled 2023-04-27: qty 100

## 2023-04-27 MED ORDER — SODIUM CHLORIDE 0.9 % IV SOLN: INTRAVENOUS | Status: DC

## 2023-04-27 MED ORDER — LIDOCAINE HCL 1 % IJ SOLN
9.0000 mL | Freq: Once | INTRAMUSCULAR | Status: AC
  Administered 2023-04-27: 9 mL

## 2023-04-27 MED ORDER — DIPHENHYDRAMINE HCL 50 MG/ML IJ SOLN
INTRAMUSCULAR | Status: AC
  Filled 2023-04-27: qty 1

## 2023-04-27 MED ORDER — NAPROXEN 500 MG PO TABS: 500.0000 mg | ORAL_TABLET | Freq: Two times a day (BID) | ORAL | 0 refills | Status: DC

## 2023-04-27 NOTE — H&P (Signed)
Chief Complaint: Patient was seen in consultation today for left knee pain  Referring Physician(s): Suttle,Dylan J  Supervising Physician: Marliss Coots  Patient Status: [facility] - Out-pt  History of Present Illness: Christian [name] is a 72 y.o. male with PMH significant for chronic back pain, chronic pain syndrome, GERD, hypertension, morbid obesity, osteoarthritis, seizures, sleep apnea, and type 1 diabetes mellitus being seen today in relation to left knee pain. Patient has advanced left knee osteoarthritis and has been seen by Dr [name] for left knee steroid injection, viscosupplementation, and has undergone physical therapy. These have not been effective in managing his left knee pain. Patient was subsequently referred to IR for image-guided geniculate artery embolization. Patient met with Dr Elby Showers on 03/29/23 in consultation for the above procedure.   Past Medical History:  Diagnosis Date   Chronic back pain 09/13/2013   Chronic pain syndrome 09/13/2013   GERD (gastroesophageal reflux disease)    hx. of   Gout 09/13/2013   Hemorrhoids    Hypertension    Morbid obesity with body mass index of 45.0-49.9 in adult [facility]) 09/13/2013   Osteoarthritis 09/13/2013   Pneumonia    hx. of   Seizures (HCC)    Sleep apnea    has never used C-pap machine due to insurance cost   Type 1 diabetes mellitus, uncontrolled     Past Surgical History:  Procedure Laterality Date   broken toe 35 years ago     COLONOSCOPY WITH PROPOFOL N/A 12/15/2022   Procedure: COLONOSCOPY WITH PROPOFOL;  Surgeon: Wyline Mood, MD;  Location: [facility] ENDOSCOPY;  Service: Gastroenterology;  Laterality: N/A;   IR RADIOLOGIST EVAL & MGMT  03/29/2023   TOTAL KNEE ARTHROPLASTY Right 05/01/2015   Procedure: RIGHT TOTAL KNEE ARTHROPLASTY;  Surgeon: Kathryne Hitch, MD;  Location: [facility] ORS;  Service: Orthopedics;  Laterality: Right;   WISDOM TOOTH EXTRACTION      Allergies: Penicillins  Medications: Prior to  Admission medications   Medication Sig Start Date End Date Taking? Authorizing Provider  aspirin 81 MG tablet Take 81 mg by mouth once.    [provider]  BD PEN NEEDLE NANO 2ND GEN 32G X 4 MM MISC USE AS DIRECTED 5 TIMES A DAY WITH INSULIN INJECTIONS 11/28/22   Carlus Pavlov, MD  bisoprolol (ZEBETA) 5 MG tablet Take 1 PO qd x 1 week, then 1/2 PO qd x 1 week, then 1/2 PO qod x 1 week, then stop 12/22/20   Hilts, Michael, MD  Continuous Blood Gluc Receiver (FREESTYLE LIBRE 2 READER) DEVI 1 applicator by Does not apply route every 14 (fourteen) days. E11.42 10/03/22   Carlus Pavlov, MD  Continuous Blood Gluc Sensor (FREESTYLE LIBRE 2 SENSOR) MISC 1 each by Does not apply route every 14 (fourteen) days. 10/03/22   Carlus Pavlov, MD  diclofenac (VOLTAREN) 75 MG EC tablet Take 1 tablet (75 mg total) by mouth 2 (two) times daily. With omeprazole 05/30/22 05/30/23  Monica Becton, MD  HUMULIN R U-500 KWIKPEN 500 UNIT/ML KwikPen INJECT 200 UNITS UNDER SKIN A DAY AS ADVISED 08/18/22   Carlus Pavlov, MD  hydrochlorothiazide (HYDRODIURIL) 12.5 MG tablet TAKE 1/2 TO 1 TABLET BY MOUTH EVERY DAY AS NEEDED FOR EDEMA 06/14/21   Hilts, Casimiro Needle, MD  metFORMIN (GLUCOPHAGE) 1000 MG tablet TAKE 1 TABLET BY MOUTH TWICE  DAILY WITH A MEAL 07/21/22   Carlus Pavlov, MD  montelukast (SINGULAIR) 10 MG tablet TAKE 1 TABLET BY MOUTH AT BEDTIME AS NEEDED. 02/28/21   Hilts,  Casimiro Needle, MD  omeprazole (PRILOSEC) 20 MG capsule Take 1 capsule (20 mg total) by mouth daily. With diclofenac 05/30/22   Monica Becton, MD     Family History  Problem Relation Age of Onset   Alcohol abuse Father    Arthritis Maternal Grandmother    Alcohol abuse Paternal Grandmother    Alcohol abuse Paternal Grandfather    Diabetes Maternal Aunt     Social History   Socioeconomic History   Marital status: Married    Spouse name: Hilda Lias   Number of children: 3   Years of education: 14   Highest education level:  Some college, no degree  Occupational History   Occupation: Retired  Tobacco Use   Smoking status: Former    Types: Cigarettes    Quit date: 03/08/2004    Years since quitting: 19.1   Smokeless tobacco: Never  Vaping Use   Vaping Use: Never used  Substance and Sexual Activity   Alcohol use: No    Comment: rare   Drug use: No   Sexual activity: Yes    Partners: Female  Other Topics Concern   Not on file  Social History Narrative   Lives with wife. He has three children. He enjoys Diplomatic Services operational officer.   Social Determinants of Health   Financial Resource Strain: Low Risk  (12/02/2022)   Overall Financial Resource Strain (CARDIA)    Difficulty of Paying Living Expenses: Not hard at all  Food Insecurity: No Food Insecurity (12/02/2022)   Hunger Vital Sign    Worried About Running Out of Food in the Last Year: Never true    Ran Out of Food in the Last Year: Never true  Transportation Needs: No Transportation Needs (12/02/2022)   PRAPARE - Administrator, Civil Service (Medical): No    Lack of Transportation (Non-Medical): No  Physical Activity: Inactive (12/02/2022)   Exercise Vital Sign    Days of Exercise per Week: 0 days    Minutes of Exercise per Session: 0 min  Stress: No Stress Concern Present (12/02/2022)   [employer] of Occupational Health - Occupational Stress Questionnaire    Feeling of Stress : Not at all  Social Connections: Moderately Isolated (12/02/2022)   Social Connection and Isolation Panel [NHANES]    Frequency of Communication with Friends and Family: Twice a week    Frequency of Social Gatherings with Friends and Family: More than three times a week    Attends Religious Services: Never    Database administrator or Organizations: No    Attends Engineer, structural: Never    Marital Status: Married    Code Status: Full Code  Review of Systems: A 12 point ROS discussed and pertinent positives are indicated in the HPI  above.  All other systems are negative.  Review of Systems  Constitutional:  Negative for chills and fever.  Respiratory:  Negative for chest tightness and shortness of breath.   Cardiovascular:  Negative for chest pain and leg swelling.  Gastrointestinal:  Negative for abdominal distention, diarrhea, nausea and vomiting.  Musculoskeletal:  Positive for arthralgias.  Neurological:  Negative for dizziness and headaches.  Psychiatric/Behavioral:  Negative for confusion.     Vital Signs: BP (!) 143/83   Pulse (!) 104   Temp 98.4 F (36.9 C) (Oral)   Resp (!) 22   Ht 5\' 6"  (1.676 m)   Wt 235 lb (106.6 kg)   SpO2 97%   BMI 37.93 kg/m  Advance Care Plan: The advanced care plan/surrogate decision maker was discussed at the time of visit and the patient did not wish to discuss or was not able to name a surrogate decision maker or provide an advance care plan.    Physical Exam Vitals reviewed.  Constitutional:      General: He is not in acute distress.    Appearance: He is obese. He is not ill-appearing.  HENT:     Mouth/Throat:     Mouth: Mucous membranes are moist.  Eyes:     Pupils: Pupils are equal, round, and reactive to light.  Cardiovascular:     Rate and Rhythm: Regular rhythm. Tachycardia present.     Pulses: Normal pulses.     Heart sounds: Normal heart sounds.  Pulmonary:     Effort: Pulmonary effort is normal.     Breath sounds: Normal breath sounds.  Abdominal:     Palpations: Abdomen is soft.     Tenderness: There is no abdominal tenderness.  Musculoskeletal:     Right lower leg: Edema present.     Left lower leg: Edema present.  Skin:    General: Skin is warm and dry.  Neurological:     Mental Status: He is alert and oriented to person, place, and time.  Psychiatric:        Mood and Affect: Mood normal.        Behavior: Behavior normal.        Thought Content: Thought content normal.        Judgment: Judgment normal.     Imaging: IR Radiologist  Eval & Mgmt  Result Date: 03/29/2023 EXAM: NEW PATIENT OFFICE VISIT CHIEF COMPLAINT: See Epic note. HISTORY OF PRESENT ILLNESS: See Epic note. REVIEW OF SYSTEMS: See Epic note. PHYSICAL EXAMINATION: See Epic note. ASSESSMENT AND PLAN: See Epic note. Marliss Coots, MD [facility] Radiology Electronically Signed   By: Marliss Coots M.D.   On: 03/29/2023 08:52    Labs:  CBC: Recent Labs    04/27/23 0810  WBC 12.1*  HGB 13.7  HCT 42.8  PLT 245    COAGS: No results for input(s): "INR", "APTT" in the last 8760 hours.  BMP: Recent Labs    05/24/22 1529  NA 140  K 4.3  CL 105  CO2 20  GLUCOSE 80  BUN 14  CALCIUM 9.1  CREATININE 0.84    LIVER FUNCTION TESTS: Recent Labs    05/24/22 1529  BILITOT 0.5  AST 23  ALT 26  PROT 7.2    TUMOR MARKERS: No results for input(s): "AFPTM", "CEA", "CA199", "CHROMGRNA" in the last 8760 hours.  Assessment and Plan:  Hannon Cera is a 72 yo male being seen today in relation to chronic left knee pain. Patient has previously met with Dr Elby Showers on 03/29/23 in consultation for image-guided left geniculate artery embolization. Patient was approved by Dr Elby Showers at that time for the above procedure. Patient presents today in his usual state of health and is NPO. Labs are pending.  The Risks and benefits of left geniculate artery embolization were discussed with the patient including, but not limited to bleeding, infection, vascular injury, post operative pain, or contrast induced renal failure.  This procedure involves the use of X-rays and because of the nature of the planned procedure, it is possible that we will have prolonged use of X-ray fluoroscopy.  Potential radiation risks to you include (but are not limited to) the following: - A  slightly elevated risk for cancer several years later in life. This risk is typically less than 0.5% percent. This risk is low in comparison to the normal  incidence of human cancer, which is 33% for women and 50% for men according to the American Cancer Society. - Radiation induced injury can include skin redness, resembling a rash, tissue breakdown / ulcers and hair loss (which can be temporary or permanent).   The likelihood of either of these occurring depends on the difficulty of the procedure and whether you are sensitive to radiation due to previous procedures, disease, or genetic conditions.   IF your procedure requires a prolonged use of radiation, you will be notified and given written instructions for further action.  It is your responsibility to monitor the irradiated area for the 2 weeks following the procedure and to notify your physician if you are concerned that you have suffered a radiation induced injury.    All of the patient's questions were answered, patient is agreeable to proceed. Consent signed and in chart.   Thank you for this interesting consult.  I greatly enjoyed meeting AUGUSTIN VISCOMI and look forward to participating in their care.  A copy of this report was sent to the requesting provider on this date.  Electronically Signed: Kennieth Francois, PA-C 04/27/2023, 8:30 AM   I spent a total of  30 Minutes   in face to face in clinical consultation, greater than 50% of which was counseling/coordinating care for left knee pain.

## 2023-04-27 NOTE — Procedures (Signed)
Interventional Radiology Procedure Note  Procedure:  1) Limited left lower extremity angiogram 2) Selective catheterization and angiography of left descending geniculate, articular branch of left descending geniculate, and superior medial geniculate arteries 3) Cone beam CT 4) Embolization of articular branch of left descending geniculate artery  Findings: Please refer to procedural dictation for full description. 6 Fr right CFA angioseal closure.  Complications: None immediate  Estimated Blood Loss: < 5mL  Recommendations: -1 hour bedrest -550 mg BID naproxen + medrol dosepak on discharge -IR will arrange 1 month follow up   Marliss Coots, MD

## 2023-04-27 NOTE — Progress Notes (Signed)
Patient clinically stable post angiogram/embolization per Dr Elby Showers, tolerated well, although during procedure increasing leg movement despite meds given. Received Versed 2.5 mg along with Fentanyl 125 mcg IV for procedure. Report given to [facility] Rn post procedure/specials/15. No bleeding nor hematoma at right groin site. Son to bedside with update given.

## 2023-05-07 ENCOUNTER — Other Ambulatory Visit: Payer: Self-pay | Admitting: Internal Medicine

## 2023-05-09 ENCOUNTER — Other Ambulatory Visit: Payer: Self-pay | Admitting: Interventional Radiology

## 2023-05-09 DIAGNOSIS — M25562 Pain in left knee: Secondary | ICD-10-CM

## 2023-05-16 ENCOUNTER — Other Ambulatory Visit: Payer: Self-pay | Admitting: Radiology

## 2023-05-24 NOTE — Addendum Note (Signed)
Encounter addended by: Zettie Cooley, RT on: 05/24/2023 7:20 AM  Actions taken: Imaging Exam ended

## 2023-05-26 NOTE — Progress Notes (Signed)
Referring Physician(s): Dr. [name]  Chief Complaint: The patient is seen in virtual follow up today s/p left GAE  History of present illness:  Christian [name], 72 year old male, has a medical history significant for HTN, morbid obesity, DM2 with peripheral neuropathy, seizures, sleep apnea, chronic back pain, gout and osteoarthritis. He has advanced stage left knee osteoarthritis and has received multiple therapies including steroid injections, viscosupplementation and physical therapy all to no avail.  He is s/p right knee arthroplasty June 2016 and did well for a while post-procedure but now complains of worsening left knee pain. Tylenol provides only minimal relief.   He was referred to Interventional Radiology for further treatment and I met with the patient 03/29/23 to discuss genicular artery embolization.  He was found to be an ideal candidate and this procedure was performed 04/27/23.  He tolerated the procedure well and he presents today via virtual telephone visit for follow up.   Knee pain improved after procedure significantly.  Over the past couple days, the pain has gotten slightly worse.  His main complaint is that he has cramping and movement with pain at night in bed.  He still uses his cane to ambulate.  He can stand up straight and walk much better than prior to the procedure.    Pre-embolization WOMAC pain survey: 84/96 VAS pain survey: 8/10  Post-embolization WOMAC pain survey: not completed today VAS pain survey: 4/10  Past Medical History:  Diagnosis Date   Chronic back pain 09/13/2013   Chronic pain syndrome 09/13/2013   GERD (gastroesophageal reflux disease)    hx. of   Gout 09/13/2013   Hemorrhoids    Hypertension    Morbid obesity with body mass index of 45.0-49.9 in adult [facility]) 09/13/2013   Osteoarthritis 09/13/2013   Pneumonia    hx. of   Seizures (HCC)    Sleep apnea    has never used C-pap machine due to insurance cost   Type 1 diabetes  mellitus, uncontrolled     Past Surgical History:  Procedure Laterality Date   broken toe 35 years ago     COLONOSCOPY WITH PROPOFOL N/A 12/15/2022   Procedure: COLONOSCOPY WITH PROPOFOL;  Surgeon: Wyline Mood, MD;  Location: [facility] ENDOSCOPY;  Service: Gastroenterology;  Laterality: N/A;   IR EMBO ARTERIAL NOT HEMORR HEMANG INC GUIDE ROADMAPPING  04/27/2023   IR RADIOLOGIST EVAL & MGMT  03/29/2023   TOTAL KNEE ARTHROPLASTY Right 05/01/2015   Procedure: RIGHT TOTAL KNEE ARTHROPLASTY;  Surgeon: Kathryne Hitch, MD;  Location: [facility] ORS;  Service: Orthopedics;  Laterality: Right;   WISDOM TOOTH EXTRACTION      Allergies: Penicillins  Medications: Prior to Admission medications   Medication Sig Start Date End Date Taking? Authorizing Provider  aspirin 81 MG tablet Take 81 mg by mouth once.    [provider]  BD PEN NEEDLE NANO 2ND GEN 32G X 4 MM MISC USE AS DIRECTED 5 TIMES A DAY WITH INSULIN INJECTIONS 11/28/22   Carlus Pavlov, MD  bisoprolol (ZEBETA) 5 MG tablet Take 1 PO qd x 1 week, then 1/2 PO qd x 1 week, then 1/2 PO qod x 1 week, then stop 12/22/20   Hilts, Michael, MD  Continuous Blood Gluc Receiver (FREESTYLE LIBRE 2 READER) DEVI 1 applicator by Does not apply route every 14 (fourteen) days. E11.42 10/03/22   Carlus Pavlov, MD  Continuous Blood Gluc Sensor (FREESTYLE LIBRE 2 SENSOR) MISC 1 each by Does not apply route every 14 (fourteen) days. 10/03/22  Carlus Pavlov, MD  diclofenac (VOLTAREN) 75 MG EC tablet Take 1 tablet (75 mg total) by mouth 2 (two) times daily. With omeprazole 05/30/22 05/30/23  Monica Becton, MD  hydrochlorothiazide (HYDRODIURIL) 12.5 MG tablet TAKE 1/2 TO 1 TABLET BY MOUTH EVERY DAY AS NEEDED FOR EDEMA 06/14/21   Hilts, Casimiro Needle, MD  insulin regular human CONCENTRATED (HUMULIN R U-500 KWIKPEN) 500 UNIT/ML KwikPen INJECT 200 UNITS UNDER SKIN A DAY AS ADVISED 05/08/23   Carlus Pavlov, MD  metFORMIN (GLUCOPHAGE) 1000 MG tablet TAKE 1  TABLET BY MOUTH TWICE  DAILY WITH A MEAL 07/21/22   Carlus Pavlov, MD  montelukast (SINGULAIR) 10 MG tablet TAKE 1 TABLET BY MOUTH AT BEDTIME AS NEEDED. 02/28/21   Hilts, Casimiro Needle, MD  naproxen (NAPROSYN) 500 MG tablet Take 1 tablet (500 mg total) by mouth 2 (two) times daily with a meal. 04/27/23   Kennieth Francois, PA  omeprazole (PRILOSEC) 20 MG capsule Take 1 capsule (20 mg total) by mouth daily. With diclofenac 05/30/22   Monica Becton, MD     Family History  Problem Relation Age of Onset   Alcohol abuse Father    Arthritis Maternal Grandmother    Alcohol abuse Paternal Grandmother    Alcohol abuse Paternal Grandfather    Diabetes Maternal Aunt     Social History   Socioeconomic History   Marital status: Married    Spouse name: Hilda Lias   Number of children: 3   Years of education: 14   Highest education level: Some college, no degree  Occupational History   Occupation: Retired  Tobacco Use   Smoking status: Former    Current packs/day: 0.00    Types: Cigarettes    Quit date: 03/08/2004    Years since quitting: 19.2   Smokeless tobacco: Never  Vaping Use   Vaping status: Never Used  Substance and Sexual Activity   Alcohol use: No    Comment: rare   Drug use: No   Sexual activity: Yes    Partners: Female  Other Topics Concern   Not on file  Social History Narrative   Lives with wife. He has three children. He enjoys Diplomatic Services operational officer.   Social Determinants of Health   Financial Resource Strain: Low Risk  (12/02/2022)   Overall Financial Resource Strain (CARDIA)    Difficulty of Paying Living Expenses: Not hard at all  Food Insecurity: No Food Insecurity (12/02/2022)   Hunger Vital Sign    Worried About Running Out of Food in the Last Year: Never true    Ran Out of Food in the Last Year: Never true  Transportation Needs: No Transportation Needs (12/02/2022)   PRAPARE - Administrator, Civil Service (Medical): No    Lack of  Transportation (Non-Medical): No  Physical Activity: Inactive (12/02/2022)   Exercise Vital Sign    Days of Exercise per Week: 0 days    Minutes of Exercise per Session: 0 min  Stress: No Stress Concern Present (12/02/2022)   [employer] of Occupational Health - Occupational Stress Questionnaire    Feeling of Stress : Not at all  Social Connections: Unknown (03/28/2023)   Received from [facility]   Social Network    Social Network: Not on file     Vital Signs: There were no vitals taken for this visit.  No physical exam was performed in lieu of virtual telephone visit.   Imaging: Knee radiograph 09/19/22  Left knee Kellgren and Lawrence grade 4  osteoarthritis.  Left GAE 04/27/23 Pre   Post    Labs:  CBC: Recent Labs    04/27/23 0810  WBC 12.1*  HGB 13.7  HCT 42.8  PLT 245    COAGS: Recent Labs    04/27/23 0810  INR 1.2    BMP: Recent Labs    04/27/23 0810  NA 140  K 4.0  CL 108  CO2 21*  GLUCOSE 130*  BUN 21  CALCIUM 8.9  CREATININE 0.94  GFRNONAA >60    LIVER FUNCTION TESTS: No results for input(s): "BILITOT", "AST", "ALT", "ALKPHOS", "PROT", "ALBUMIN" in the last 8760 hours.  Assessment and Plan: 72 year old male with a history of advanced knee osteoarthritis who underwent left geniculate artery embolization 04/27/23.  Overall, he is recovering well with improved pain score from 8/10 to 4/10.  He still has some cramping and what sounds like restless leg syndrome, which he demonstrated during the procedure, which causes him the most pain.  He reports that some days are better than others, but overall his pain is improved.  Hopefully he will continue to experience pain improvement over the coming months.   Follow up in IR clinic in 2 months.      Marliss Coots, MD Pager: [phone]    I spent a total of 25 Minutes in virtual clinical consultation, greater than 50% of which was counseling/coordinating care s/p left GAE.

## 2023-05-29 ENCOUNTER — Ambulatory Visit: Admission: RE | Admit: 2023-05-29 | Payer: Medicare Other | Source: Ambulatory Visit

## 2023-05-29 DIAGNOSIS — M25562 Pain in left knee: Secondary | ICD-10-CM

## 2023-05-30 ENCOUNTER — Ambulatory Visit: Payer: Medicare Other | Admitting: Internal Medicine

## 2023-06-20 DIAGNOSIS — E1140 Type 2 diabetes mellitus with diabetic neuropathy, unspecified: Secondary | ICD-10-CM | POA: Diagnosis not present

## 2023-06-20 DIAGNOSIS — Z96651 Presence of right artificial knee joint: Secondary | ICD-10-CM | POA: Diagnosis not present

## 2023-06-20 DIAGNOSIS — M170 Bilateral primary osteoarthritis of knee: Secondary | ICD-10-CM | POA: Diagnosis not present

## 2023-06-20 DIAGNOSIS — G894 Chronic pain syndrome: Secondary | ICD-10-CM | POA: Diagnosis not present

## 2023-06-23 ENCOUNTER — Other Ambulatory Visit: Payer: Self-pay | Admitting: Internal Medicine

## 2023-06-23 NOTE — Addendum Note (Signed)
Encounter addended by: Meta Hatchet, RT on: 06/23/2023 1:48 PM  Actions taken: Imaging Exam ended

## 2023-07-14 ENCOUNTER — Encounter: Payer: Self-pay | Admitting: Internal Medicine

## 2023-07-14 ENCOUNTER — Ambulatory Visit (INDEPENDENT_AMBULATORY_CARE_PROVIDER_SITE_OTHER): Payer: Medicare Other | Admitting: Internal Medicine

## 2023-07-14 VITALS — BP 122/80 | HR 111 | Ht 66.0 in | Wt 323.0 lb

## 2023-07-14 DIAGNOSIS — E1142 Type 2 diabetes mellitus with diabetic polyneuropathy: Secondary | ICD-10-CM

## 2023-07-14 DIAGNOSIS — Z7984 Long term (current) use of oral hypoglycemic drugs: Secondary | ICD-10-CM | POA: Diagnosis not present

## 2023-07-14 DIAGNOSIS — E1165 Type 2 diabetes mellitus with hyperglycemia: Secondary | ICD-10-CM | POA: Diagnosis not present

## 2023-07-14 DIAGNOSIS — E1169 Type 2 diabetes mellitus with other specified complication: Secondary | ICD-10-CM | POA: Diagnosis not present

## 2023-07-14 DIAGNOSIS — Z794 Long term (current) use of insulin: Secondary | ICD-10-CM | POA: Diagnosis not present

## 2023-07-14 DIAGNOSIS — E785 Hyperlipidemia, unspecified: Secondary | ICD-10-CM | POA: Diagnosis not present

## 2023-07-14 DIAGNOSIS — Z6841 Body Mass Index (BMI) 40.0 and over, adult: Secondary | ICD-10-CM

## 2023-07-14 LAB — LIPID PANEL
Cholesterol: 137 mg/dL (ref 0–200)
HDL: 39.1 mg/dL (ref >39.00)
LDL Cholesterol: 74 mg/dL (ref 0–99)
NonHDL: 97.89
Total CHOL/HDL Ratio: 4
Triglycerides: 117 mg/dL (ref 0.0–149.0)
VLDL: 23.4 mg/dL (ref 0.0–40.0)

## 2023-07-14 LAB — POCT GLYCOSYLATED HEMOGLOBIN (HGB A1C): Hemoglobin A1C: 6.3 % — AB (ref 4.0–5.6)

## 2023-07-14 NOTE — Addendum Note (Signed)
Addended by: Pollie Meyer on: 07/14/2023 01:32 PM   Modules accepted: Orders

## 2023-07-14 NOTE — Patient Instructions (Addendum)
Please change: - Metformin 2000 mg with the first meal of the day - U500 30 min before meals  75 units before 1st meal of the day (may need to decrease to 60 units) 30 units before dinner  NO INSULIN AT BEDTIME!  Please return in 3 months.

## 2023-07-14 NOTE — Progress Notes (Addendum)
Patient ID: Christian [name], male   DOB: Mar 27, 1951, 72 y.o.   MRN: [id number]  HPI Christian [name] is a 72 y.o.-year-old male, returning for f/u for DM2, dx 2009, insulin-dependent since 2014, uncontrolled, with complications (DR, Peripheral neuropathy). Last visit: 5 months ago. He is here with his wife, who is also my patient. [name] + [employer].    Interim history: No increased urination, blurry vision, nausea, chest pain. He continues to have left knee and shoulder pain.  He cannot have total knee replacement due to his high BMI.   He has steroid injections. He lost 21 lbs in the last 6 months.  Reviewed HbA1c levels: Lab Results  Component Value Date   HGBA1C 6.7 (A) 01/30/2023   HGBA1C 7.0 (A) 10/03/2022   HGBA1C 6.5 (A) 05/24/2022   HGBA1C 7.1 (A) 01/17/2022   HGBA1C 8.4 (A) 09/14/2021   HGBA1C 7.7 (A) 05/14/2021   HGBA1C 7.5 (A) 01/12/2021   HGBA1C 7.8 (A) 09/08/2020   HGBA1C 7.0 (A) 05/14/2020   HGBA1C 7.5 (A) 01/09/2020   HGBA1C 6.9 (A) 09/09/2019   HGBA1C 7.6 (A) 05/08/2019   HGBA1C 8.2 (A) 01/08/2019   HGBA1C 7.7 (A) 09/25/2018   HGBA1C 7.4 (A) 05/25/2018   HGBA1C 9.0 02/22/2018   HGBA1C 9.1 11/23/2017   HGBA1C 9 08/24/2017   HGBA1C 8.4 05/25/2017   HGBA1C 8.8 02/06/2017  08/24/2017: HbA1c 9%  Pt is on: - Metformin 1000 mg 2x a daily >> 2000 mg with dinner >> at bedtime - U500 30 min before meals - started 09/2021: 100 units before b'fast 50 >> 60 >> 40-60 units before early dinner 30-40 >> 40 >> 30-40 units at bedtime >> stopped Stopped Jardiance 25 mg >> but too expensive - in the donut hole; had yeast infections. Glipizide and Invokana were started 03/2014.  He has been on Victoza in the past >> not efficient anymore. We stopped glipizide in 01/2022. Previously on Lantus and Humalog high doses. He stopped Trulicity 01/2022 due to price.  Pt checks his sugars >4x day:  Prev.: - am:  74, 90-135 >> 90-146, 230 >> 78, 97-140s, 155, 188 - 2h after b'fast:  157 >> n/c >> 172 >> 120 >> 121, 184 >> n/c - before lunch: 280 >> 125 >> 190-200 >> n/c >> 102 >> n/c - 2h after lunch: 220-230 >> n/c >> 190-220 >> n/c - before dinner: n/c >> 200s >> n/c  >> 150-185 >> 164 >> n/c - 2h after dinner: n/c >> 74 >> n/c - bedtime: 230, 281 >> 180s, 333 >> n/c >> 200, 331 >> n/c - nighttime: n/c >> 65 >> 57 - 1x a mo >> 79 x 1 at night Lowest sugar was  57 >> 78 >> 50s, he has hypoglycemia awareness in the 90s Highest sugar was 331 >> .Marland KitchenMarland Kitchen285 (steroid inj) >> 181 >> 200.  Meter: OneTouch Ultra Mini >> [name]  -No CKD, last BUN/creatinine:  Lab Results  Component Value Date   BUN 21 04/27/2023   CREATININE 0.94 04/27/2023  Not on ACE inhibitor/ARB.  Normal ACR: Lab Results  Component Value Date   MICRALBCREAT 0.6 05/24/2022   MICRALBCREAT 1.1 05/14/2021   MICRALBCREAT 0.7 09/09/2019   MICRALBCREAT 1.1 08/31/2017   MICRALBCREAT 0.8 08/20/2015   MICRALBCREAT 0.3 09/11/2013   Normal GFR levels: Lab Results  Component Value Date   GFRNONAA >60 04/27/2023   GFRNONAA 84 09/09/2019   GFRNONAA >60 05/02/2015   GFRNONAA >60 04/27/2015   -+ HL; last  set of lipids: Lab Results  Component Value Date   CHOL 143 05/24/2022   HDL 32.60 (L) 05/24/2022   LDLCALC 77 05/24/2022   TRIG 165.0 (H) 05/24/2022   CHOLHDL 4 05/24/2022  No statins due to leg cramps.  - last eye exam was 12/07/2022: no D, prev. reportedly + DR  -+ numbness and tingling in his feet.  Last foot exam 05/2022.  He had R TKR in 04/2015.  He has steroid inj in L knee and L shoulder.  Also uses CBD oil liniment and this helps. She has OSA.  ROS: + See HPI  I reviewed pt's medications, allergies, PMH, social hx, family hx, and changes were documented in the history of present illness. Otherwise, unchanged from my initial visit note.  Past Medical History:  Diagnosis Date   Chronic back pain 09/13/2013   Chronic pain syndrome 09/13/2013   GERD (gastroesophageal reflux disease)     hx. of   Gout 09/13/2013   Hemorrhoids    Hypertension    Morbid obesity with body mass index of 45.0-49.9 in adult [facility]) 09/13/2013   Osteoarthritis 09/13/2013   Pneumonia    hx. of   Seizures (HCC)    Sleep apnea    has never used C-pap machine due to insurance cost   Type 1 diabetes mellitus, uncontrolled    Past Surgical History:  Procedure Laterality Date   broken toe 35 years ago     COLONOSCOPY WITH PROPOFOL N/A 12/15/2022   Procedure: COLONOSCOPY WITH PROPOFOL;  Surgeon: Wyline Mood, MD;  Location: [facility] ENDOSCOPY;  Service: Gastroenterology;  Laterality: N/A;   IR EMBO ARTERIAL NOT HEMORR HEMANG INC GUIDE ROADMAPPING  04/27/2023   IR RADIOLOGIST EVAL & MGMT  03/29/2023   TOTAL KNEE ARTHROPLASTY Right 05/01/2015   Procedure: RIGHT TOTAL KNEE ARTHROPLASTY;  Surgeon: Kathryne Hitch, MD;  Location: [facility] ORS;  Service: Orthopedics;  Laterality: Right;   WISDOM TOOTH EXTRACTION     Social History   Socioeconomic History   Marital status: Married    Spouse name: Hilda Lias   Number of children: 3   Years of education: 14   Highest education level: Some college, no degree  Occupational History   Occupation: Retired  Tobacco Use   Smoking status: Former    Current packs/day: 0.00    Types: Cigarettes    Quit date: 03/08/2004    Years since quitting: 19.3   Smokeless tobacco: Never  Vaping Use   Vaping status: Never Used  Substance and Sexual Activity   Alcohol use: No    Comment: rare   Drug use: No   Sexual activity: Yes    Partners: Female  Other Topics Concern   Not on file  Social History Narrative   Lives with wife. He has three children. He enjoys Diplomatic Services operational officer.   Social Determinants of Health   Financial Resource Strain: Low Risk  (12/02/2022)   Overall Financial Resource Strain (CARDIA)    Difficulty of Paying Living Expenses: Not hard at all  Food Insecurity: No Food Insecurity (12/02/2022)   Hunger Vital Sign    Worried About Running Out of  Food in the Last Year: Never true    Ran Out of Food in the Last Year: Never true  Transportation Needs: No Transportation Needs (12/02/2022)   PRAPARE - Administrator, Civil Service (Medical): No    Lack of Transportation (Non-Medical): No  Physical Activity: Inactive (12/02/2022)   Exercise Vital Sign  Days of Exercise per Week: 0 days    Minutes of Exercise per Session: 0 min  Stress: No Stress Concern Present (12/02/2022)   [employer] of Occupational Health - Occupational Stress Questionnaire    Feeling of Stress : Not at all  Social Connections: Unknown (03/28/2023)   Received from [facility], [facility]   Social Network    Social Network: Not on file  Intimate Partner Violence: Unknown (03/28/2023)   Received from [facility], [facility]   HITS    Physically Hurt: Not on file    Insult or Talk Down To: Not on file    Threaten Physical Harm: Not on file    Scream or Curse: Not on file   Current Outpatient Medications on File Prior to Visit  Medication Sig Dispense Refill   aspirin 81 MG tablet Take 81 mg by mouth once.     BD PEN NEEDLE NANO 2ND GEN 32G X 4 MM MISC USE AS DIRECTED 5 TIMES A DAY WITH INSULIN INJECTIONS 200 each 5   bisoprolol (ZEBETA) 5 MG tablet Take 1 PO qd x 1 week, then 1/2 PO qd x 1 week, then 1/2 PO qod x 1 week, then stop 30 tablet 0   Continuous Blood Gluc Receiver (FREESTYLE LIBRE 2 READER) DEVI 1 applicator by Does not apply route every 14 (fourteen) days. E11.42 2 each 3   Continuous Blood Gluc Sensor (FREESTYLE LIBRE 2 SENSOR) MISC 1 each by Does not apply route every 14 (fourteen) days. 6 each 3   hydrochlorothiazide (HYDRODIURIL) 12.5 MG tablet TAKE 1/2 TO 1 TABLET BY MOUTH EVERY DAY AS NEEDED FOR EDEMA 90 tablet 2   insulin regular human CONCENTRATED (HUMULIN R U-500 KWIKPEN) 500 UNIT/ML KwikPen INJECT 200 UNITS UNDER SKIN A DAY AS ADVISED 24 mL 3   metFORMIN (GLUCOPHAGE) 1000 MG tablet TAKE 1 TABLET BY MOUTH TWICE   DAILY WITH A MEAL 180 tablet 1   montelukast (SINGULAIR) 10 MG tablet TAKE 1 TABLET BY MOUTH AT BEDTIME AS NEEDED. 90 tablet 2   naproxen (NAPROSYN) 500 MG tablet Take 1 tablet (500 mg total) by mouth 2 (two) times daily with a meal. 10 tablet 0   omeprazole (PRILOSEC) 20 MG capsule Take 1 capsule (20 mg total) by mouth daily. With diclofenac 90 capsule 3   No current facility-administered medications on file prior to visit.   Allergies  Allergen Reactions   Penicillins     REACTION: swelling, rash   Family History  Problem Relation Age of Onset   Alcohol abuse Father    Arthritis Maternal Grandmother    Alcohol abuse Paternal Grandmother    Alcohol abuse Paternal Grandfather    Diabetes Maternal Aunt    PE: BP 122/80   Pulse (!) 111   Ht 5\' 6"  (1.676 m)   Wt (!) 323 lb (146.5 kg)   SpO2 95%   BMI 52.13 kg/m    Wt Readings from Last 3 Encounters:  07/14/23 (!) 323 lb (146.5 kg)  04/27/23 235 lb (106.6 kg)  01/02/23 (!) 344 lb (156 kg)   Constitutional: obese, in NAD, in motorized wheelchair Eyes: EOMI, no exophthalmos ENT: no thyromegaly, no cervical lymphadenopathy Cardiovascular: Tachycardia, RR, No MRG, + LE periankle edema L>R Respiratory: CTA B Musculoskeletal: no deformities Skin:+ stasis dermatitis rash bilateral lower legs Neurological: no tremor with outstretched hands Diabetic Foot Exam - Simple   Simple Foot Form Diabetic Foot exam was performed with the following findings: Yes 07/14/2023 11:29  AM  Visual Inspection No deformities, no ulcerations, no other skin breakdown bilaterally: Yes Sensation Testing See comments: Yes Pulse Check See comments: Yes Comments Pulses not palpable Decreased sensation to monofilament throughout Indurated skin     ASSESSMENT: 1. DM2, insulin-dependent, uncontrolled, with complications - PN - stable, worse in L foot - DR  2. PN  3.  Obesity class III  PLAN:  1. Patient with history of uncontrolled type 2  diabetes, previously on a complex medication regimen, currently only on metformin and U-500 insulin.  Trulicity was not covered for him and he had to stop.  Per his preference, we did not start another GLP-1 receptor agonist.  At last visit, sugars appears to be mostly at goal in the morning with only occasional mildly hyperglycemic values.  He was not checking later in the day and I advised him to start doing so.  We did not change his regimen.  We discussed about attaching a CGM.  He had one but came off due to sweating.  He was planning to retry to attach a new CGM and secured with tape.  HbA1c at last visit was lower, at 6.7%. CGM interpretation: -At today's visit, we reviewed his CGM downloads: It appears that 76% of values are in target range (goal >70%), while 60% are higher than 180 (goal <25%), and 8% are lower than 70 (goal <4%).  The calculated average blood sugar is 131.  The projected HbA1c for the next 3 months (GMI) is 6.4%. -Reviewing the CGM trends, sugars appear to be dropping abruptly overnight with a nadir between 5 AM and 9 AM and increasing blood sugars afterwards, peaking around 12 AM.  He does not that he is mostly skipping the insulin at bedtime, only taking it at times (for example last night).  I strongly advised him to completely stay off this.  Even for the dinnertime insulin, I advised him to reduce the dose.  As he mentions that he sometimes drops his blood sugars overnight even if he does not take his dinnertime insulin, I also advised him to reduce his insulin with the first meal of the day.  Will also move metformin with the first meal of the day.  I advised him to let me know if he has any more low blood sugars. -He is having problems with the CGM staying put due to increased weight.  I am hoping that he can keep this on better now that the weather is cooler. - I advised him to: Patient Instructions  Please change: - Metformin 2000 mg with the first meal of the day - U500  30 min before meals  75 units before 1st meal of the day (may need to decrease to 60 units) 30 units before dinner  NO INSULIN AT BEDTIME!  Please return in 3 months.  - we checked his HbA1c: 6.3% (lower) - advised to check sugars at different times of the day - 4x a day, rotating check times - advised for yearly eye exams >> he is UTD - return to clinic in 3 months  2. HL -Reviewed latest lipid panel from 05/2022: LDL slightly higher than goal, triglycerides also slightly high, HDL low: Lab Results  Component Value Date   CHOL 143 05/24/2022   HDL 32.60 (L) 05/24/2022   LDLCALC 77 05/24/2022   TRIG 165.0 (H) 05/24/2022   CHOLHDL 4 05/24/2022  -He did not tolerate statins due to leg cramps.  I suggested ezetimibe, but he did not start.  3.  Obesity class III -He saw nutrition 10/2019 -Unfortunately, he had to come off Trulicity due to price -At last visit, he cannot be weighed, however, in the last approximately 6 months, he lost 21 pounds!  Component     Latest Ref Rng 07/14/2023  Cholesterol     0 - 200 mg/dL 161   Triglycerides     0.0 - 149.0 mg/dL 096.0   HDL Cholesterol     >39.00 mg/dL 45.40   VLDL     0.0 - 40.0 mg/dL 98.1   LDL (calc)     0 - 99 mg/dL 74   Total CHOL/HDL Ratio 4   NonHDL 97.89   Lipid fractions at goal except for slightly high LDL.  However, all fractions improved since last check.  Carlus Pavlov, MD PhD [facility] Endocr[facility]

## 2023-07-19 DIAGNOSIS — I83892 Varicose veins of left lower extremities with other complications: Secondary | ICD-10-CM | POA: Diagnosis not present

## 2023-07-21 ENCOUNTER — Other Ambulatory Visit: Payer: Self-pay | Admitting: Interventional Radiology

## 2023-07-21 DIAGNOSIS — M25562 Pain in left knee: Secondary | ICD-10-CM

## 2023-07-31 NOTE — Progress Notes (Shared)
Referring Physician(s): Dr. [name]  Chief Complaint: The patient is seen in virtual follow up today s/p left GAE  History of present illness: HPI from last clinic visit 05/29/23: Christian [name], 72 year old male, has a medical history significant for HTN, morbid obesity, DM2 with peripheral neuropathy, seizures, sleep apnea, chronic back pain, gout and osteoarthritis. He has advanced stage left knee osteoarthritis and has received multiple therapies including steroid injections, viscosupplementation and physical therapy all to no avail.  He is s/p right knee arthroplasty June 2016 and did well for a while post-procedure but now complains of worsening left knee pain. Tylenol provides only minimal relief.   He was referred to Interventional Radiology for further treatment and I met with the patient 03/29/23 to discuss genicular artery embolization.  He was found to be an ideal candidate and this procedure was performed 04/27/23.  He tolerated the procedure well and he presents today via virtual telephone visit for follow up.   Knee pain improved after procedure significantly.  Over the past couple days, the pain has gotten slightly worse.  His main complaint is that he has cramping and movement with pain at night in bed.  He still uses his cane to ambulate.  He can stand up straight and walk much better than prior to the procedure.  The leg cramping he described sounded like restless leg syndrome which he demonstrated during the procedure. He reported that some days were better than others but overall his pain was improved. We discussed that as more time pass his pain would hopefully continue to improve.   He presents today for follow up via virtual tele-visit.   Past Medical History:  Diagnosis Date   Chronic back pain 09/13/2013   Chronic pain syndrome 09/13/2013   GERD (gastroesophageal reflux disease)    hx. of   Gout 09/13/2013   Hemorrhoids    Hypertension    Morbid obesity  with body mass index of 45.0-49.9 in adult [facility]) 09/13/2013   Osteoarthritis 09/13/2013   Pneumonia    hx. of   Seizures (HCC)    Sleep apnea    has never used C-pap machine due to insurance cost   Type 1 diabetes mellitus, uncontrolled     Past Surgical History:  Procedure Laterality Date   broken toe 35 years ago     COLONOSCOPY WITH PROPOFOL N/A 12/15/2022   Procedure: COLONOSCOPY WITH PROPOFOL;  Surgeon: Wyline Mood, MD;  Location: [facility] ENDOSCOPY;  Service: Gastroenterology;  Laterality: N/A;   IR EMBO ARTERIAL NOT HEMORR HEMANG INC GUIDE ROADMAPPING  04/27/2023   IR RADIOLOGIST EVAL & MGMT  03/29/2023   TOTAL KNEE ARTHROPLASTY Right 05/01/2015   Procedure: RIGHT TOTAL KNEE ARTHROPLASTY;  Surgeon: Kathryne Hitch, MD;  Location: [facility];  Service: Orthopedics;  Laterality: Right;   WISDOM TOOTH EXTRACTION      Allergies: Penicillins  Medications: Prior to Admission medications   Medication Sig Start Date End Date Taking? Authorizing Provider  aspirin 81 MG tablet Take 81 mg by mouth once.    [provider]  BD PEN NEEDLE NANO 2ND GEN 32G X 4 MM MISC USE AS DIRECTED 5 TIMES A DAY WITH INSULIN INJECTIONS 11/28/22   Carlus Pavlov, MD  bisoprolol (ZEBETA) 5 MG tablet Take 1 PO qd x 1 week, then 1/2 PO qd x 1 week, then 1/2 PO qod x 1 week, then stop Patient not taking: Reported on 07/14/2023 12/22/20   Hilts, Casimiro Needle, MD  Continuous Blood Gluc Receiver (  FREESTYLE LIBRE 2 READER) DEVI 1 applicator by Does not apply route every 14 (fourteen) days. E11.42 Patient not taking: Reported on 07/14/2023 10/03/22   Carlus Pavlov, MD  Continuous Blood Gluc Sensor (FREESTYLE LIBRE 2 SENSOR) MISC 1 each by Does not apply route every 14 (fourteen) days. Patient not taking: Reported on 07/14/2023 10/03/22   Carlus Pavlov, MD  Continuous Glucose Sensor (FREESTYLE LIBRE 3 SENSOR) MISC by Does not apply route. Use to check glucose continuously change every 14 days    [provider]  hydrochlorothiazide (HYDRODIURIL) 12.5 MG tablet TAKE 1/2 TO 1 TABLET BY MOUTH EVERY DAY AS NEEDED FOR EDEMA 06/14/21   Hilts, Casimiro Needle, MD  insulin regular human CONCENTRATED (HUMULIN R U-500 KWIKPEN) 500 UNIT/ML KwikPen INJECT 200 UNITS UNDER SKIN A DAY AS ADVISED 05/08/23   Carlus Pavlov, MD  metFORMIN (GLUCOPHAGE) 1000 MG tablet TAKE 1 TABLET BY MOUTH TWICE  DAILY WITH A MEAL 06/26/23   Carlus Pavlov, MD  montelukast (SINGULAIR) 10 MG tablet TAKE 1 TABLET BY MOUTH AT BEDTIME AS NEEDED. 02/28/21   Hilts, Casimiro Needle, MD  naproxen (NAPROSYN) 500 MG tablet Take 1 tablet (500 mg total) by mouth 2 (two) times daily with a meal. 04/27/23   Kennieth Francois, PA  omeprazole (PRILOSEC) 20 MG capsule Take 1 capsule (20 mg total) by mouth daily. With diclofenac 05/30/22   Monica Becton, MD     Family History  Problem Relation Age of Onset   Alcohol abuse Father    Arthritis Maternal Grandmother    Alcohol abuse Paternal Grandmother    Alcohol abuse Paternal Grandfather    Diabetes Maternal Aunt     Social History   Socioeconomic History   Marital status: Married    Spouse name: Hilda Lias   Number of children: 3   Years of education: 14   Highest education level: Some college, no degree  Occupational History   Occupation: Retired  Tobacco Use   Smoking status: Former    Current packs/day: 0.00    Types: Cigarettes    Quit date: 03/08/2004    Years since quitting: 19.4   Smokeless tobacco: Never  Vaping Use   Vaping status: Never Used  Substance and Sexual Activity   Alcohol use: No    Comment: rare   Drug use: No   Sexual activity: Yes    Partners: Female  Other Topics Concern   Not on file  Social History Narrative   Lives with wife. He has three children. He enjoys Diplomatic Services operational officer.   Social Determinants of Health   Financial Resource Strain: Low Risk  (12/02/2022)   Overall Financial Resource Strain (CARDIA)    Difficulty of Paying Living  Expenses: Not hard at all  Food Insecurity: No Food Insecurity (12/02/2022)   Hunger Vital Sign    Worried About Running Out of Food in the Last Year: Never true    Ran Out of Food in the Last Year: Never true  Transportation Needs: No Transportation Needs (12/02/2022)   PRAPARE - Administrator, Civil Service (Medical): No    Lack of Transportation (Non-Medical): No  Physical Activity: Inactive (12/02/2022)   Exercise Vital Sign    Days of Exercise per Week: 0 days    Minutes of Exercise per Session: 0 min  Stress: No Stress Concern Present (12/02/2022)   [employer] of Occupational Health - Occupational Stress Questionnaire    Feeling of Stress : Not at all  Social Connections:  Unknown (03/28/2023)   Received from [facility]   Social Network    Social Network: Not on file     Vital Signs: There were no vitals taken for this visit.  No physical exam was performed in lieu of virtual telephone visit.   Imaging: Knee radiograph 09/19/22  Left knee Kellgren and Lawrence grade 4 osteoarthritis.   Left GAE 04/27/23 Pre    Post       Labs:  CBC: Recent Labs    04/27/23 0810  WBC 12.1*  HGB 13.7  HCT 42.8  PLT 245    COAGS: Recent Labs    04/27/23 0810  INR 1.2    BMP: Recent Labs    04/27/23 0810  NA 140  K 4.0  CL 108  CO2 21*  GLUCOSE 130*  BUN 21  CALCIUM 8.9  CREATININE 0.94  GFRNONAA >60    LIVER FUNCTION TESTS: No results for input(s): "BILITOT", "AST", "ALT", "ALKPHOS", "PROT", "ALBUMIN" in the last 8760 hours.  Assessment and Plan:  72 year old male with a history of advanced knee osteoarthritis who underwent left geniculate artery embolization 04/27/23.    Electronically Signed: Mickie Kay 07/31/2023, 2:17 PM   I spent a total of 25 Minutes in virtual clinical consultation, greater than 50% of which was counseling/coordinating care s/p left GAE.

## 2023-08-02 ENCOUNTER — Ambulatory Visit
Admission: RE | Admit: 2023-08-02 | Discharge: 2023-08-02 | Disposition: A | Discharge status: Home / Self Care | Payer: Medicare Other | Source: Ambulatory Visit | Attending: Interventional Radiology | Admitting: Interventional Radiology

## 2023-08-02 DIAGNOSIS — M25562 Pain in left knee: Secondary | ICD-10-CM | POA: Diagnosis not present

## 2023-08-02 HISTORY — PX: IR RADIOLOGIST EVAL & MGMT: IMG5224

## 2023-08-07 DIAGNOSIS — I87392 Chronic venous hypertension (idiopathic) with other complications of left lower extremity: Secondary | ICD-10-CM | POA: Diagnosis not present

## 2023-08-07 DIAGNOSIS — I83892 Varicose veins of left lower extremities with other complications: Secondary | ICD-10-CM | POA: Diagnosis not present

## 2023-08-23 ENCOUNTER — Other Ambulatory Visit: Payer: Self-pay | Admitting: Sports Medicine

## 2023-08-23 DIAGNOSIS — G894 Chronic pain syndrome: Secondary | ICD-10-CM

## 2023-08-30 DIAGNOSIS — G894 Chronic pain syndrome: Secondary | ICD-10-CM | POA: Diagnosis not present

## 2023-08-30 DIAGNOSIS — M170 Bilateral primary osteoarthritis of knee: Secondary | ICD-10-CM | POA: Diagnosis not present

## 2023-08-30 DIAGNOSIS — E1140 Type 2 diabetes mellitus with diabetic neuropathy, unspecified: Secondary | ICD-10-CM | POA: Diagnosis not present

## 2023-08-30 DIAGNOSIS — Z96651 Presence of right artificial knee joint: Secondary | ICD-10-CM | POA: Diagnosis not present

## 2023-09-07 ENCOUNTER — Ambulatory Visit (INDEPENDENT_AMBULATORY_CARE_PROVIDER_SITE_OTHER): Payer: Medicare Other | Admitting: Family Medicine

## 2023-09-07 VITALS — BP 136/88 | HR 102

## 2023-09-07 DIAGNOSIS — L03115 Cellulitis of right lower limb: Secondary | ICD-10-CM

## 2023-09-07 MED ORDER — DOXYCYCLINE HYCLATE 100 MG PO TABS
100.0000 mg | ORAL_TABLET | Freq: Two times a day (BID) | ORAL | 0 refills | Status: DC
Start: 2023-09-07 — End: 2023-12-11

## 2023-09-07 NOTE — Progress Notes (Signed)
Acute Office Visit  Subjective:     Patient ID: Christian [name], male    DOB: June 30, 1951, 72 y.o.   MRN: [id number]  Chief Complaint  Patient presents with   Diabetic Ulcer    Lower R leg    HPI Patient is in today for ulcer on shin for a few week.  He thinks he thinks it was actually a spider bite initially.  He never had any injury to the skin that he remembers.  And now has a scab on it.  He says it never drained.  But it has been starting to get red over the last couple of days so he came in.  He has been putting Neosporin on it twice a day.   ROS      Objective:    BP 136/88   Pulse (!) 102   SpO2 98%    Physical Exam  Wound area measures 1.0 x 0.9 cm.  In the area of the erythema measures 3 x 5 cm.    No results found for any visits on 09/07/23.      Assessment & Plan:   Problem List Items Addressed This Visit   None Visit Diagnoses     Cellulitis of right lower extremity    -  Primary      Will treat for cellulitis - will treat with doxycycline.  Discontinue triple antibiotic ointment and switch to Vaseline.  If not healing over the next week please let us know.  Is no induration around the wound and it is not open.  Meds ordered this encounter  Medications   doxycycline (VIBRA-TABS) 100 MG tablet    Sig: Take 1 tablet (100 mg total) by mouth 2 (two) times daily.    Dispense:  14 tablet    Refill:  0    No follow-ups on file.  Nani Gasser, MD

## 2023-09-07 NOTE — Patient Instructions (Addendum)
Please stop the Neosporin and switch to Vaseline twice a day to keep the area well moisturized. Call if not improving.

## 2023-09-20 ENCOUNTER — Ambulatory Visit (INDEPENDENT_AMBULATORY_CARE_PROVIDER_SITE_OTHER): Payer: Medicare Other | Admitting: Orthopedic Surgery

## 2023-09-20 ENCOUNTER — Other Ambulatory Visit: Payer: Self-pay

## 2023-09-20 DIAGNOSIS — M12811 Other specific arthropathies, not elsewhere classified, right shoulder: Secondary | ICD-10-CM

## 2023-09-20 DIAGNOSIS — M12812 Other specific arthropathies, not elsewhere classified, left shoulder: Secondary | ICD-10-CM | POA: Diagnosis not present

## 2023-09-20 DIAGNOSIS — G8929 Other chronic pain: Secondary | ICD-10-CM

## 2023-09-22 ENCOUNTER — Encounter: Payer: Self-pay | Admitting: Orthopedic Surgery

## 2023-09-22 NOTE — Progress Notes (Unsigned)
Office Visit Note   Patient: Christian [name]           Date of Birth: 1951/09/29           MRN: [id number] Visit Date: 09/20/2023 Requested by: Monica Becton, MD [address] [place],  [place] [address] PCP: Monica Becton, MD  Subjective: Chief Complaint  Patient presents with   Right Shoulder - Pain    HPI: Christian [name] is a 72 y.o. 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  Did have a scan 2 months ago and shoulder arthroplasty was discussed.  Last hemoglobin A1c 6.3.  He does a lot of computer work every day and has issues getting his hand up to the keyboard.  He does do weightbearing through the cane on the right-hand side.  Pain does wake him from sleep at night.  He wears his diabetic sensor on the left arm.  Denies any radicular pain.  Does get around in a electric scooter.  Does have a right lower extremity ulcer on his lower leg.  He has had a right total knee replacement done by Dr. Magnus Ivan on that side..                ROS: All systems reviewed are negative as they relate to the chief complaint within the history of present illness.  Patient denies fevers or chills.  Assessment & Plan: Visit Diagnoses:  1. Rotator cuff arthropathy of both shoulders     Plan: Impression is end-stage shoulder arthritis and rotator cuff arthropathy on the right-hand side with significant limitation of function.  I think he would be a reasonable candidate for reverse shoulder replacement.  We would need to consider his upper extremity weightbearing status when doing preoperative templating.  More pressing is this ulcer on his right leg which is below the total knee prosthesis.  Plan refer him to Dr. Lajoyce Corners for treatment of that ulcer.  Anticipate resolution so that we could get him fixed in January.  The risk and benefits of reverse shoulder  replacement are discussed with the patient include not limited to infection nerve or vessel damage instability as well as incomplete pain relief and incomplete restitution of function.  All questions answered.  Thin cut CT scan pending for patient specific instrumentation.  Follow-Up Instructions: No follow-ups on file.   Orders:  No orders of the defined types were placed in this encounter.  No orders of the defined types were placed in this encounter.     Procedures: No procedures performed   Clinical Data: No additional findings.  Objective: Vital Signs: There were no vitals taken for this visit.  Physical Exam:  Constitutional: Patient appears well-developed HEENT:  Head: Normocephalic Eyes:EOM are normal Neck: Normal range of motion Cardiovascular: Normal rate Pulmonary/chest: Effort normal Neurologic: Patient is alert Skin: Skin is warm Psychiatric: Patient has normal mood and affect  Ortho Exam: Ortho exam demonstrates functional deltoid.  Significantly diminished active and passive range of motion on that right-hand side.  Motor or sensory function in the hand is intact.  Plan for further documentation range of motion at his next clinic visit with [facility] to recheck the ulcer.  Specialty Comments:  No specialty comments available.  Imaging: No results found.   PMFS History: Patient Active Problem List   Diagnosis Date Noted   Venous stasis ulcer, left lower  extremity with lymphedema 02/07/2023   Insulin dependent type 2 diabetes mellitus (HCC) 01/02/2023   Impingement syndrome, shoulder, right 10/15/2021   Olecranon bursitis, left elbow 09/08/2021   Encounter for screening colonoscopy 06/01/2021   Polyneuropathy associated with underlying disease (HCC) 05/08/2019   Chronic left shoulder pain 01/02/2019   Hyperlipidemia associated with type 2 diabetes mellitus (HCC) 09/03/2017   Uncontrolled type 2 diabetes mellitus with peripheral neuropathy 01/27/2016    Status post total right knee replacement 05/01/2015   Gout 09/13/2013   Chronic pain syndrome 09/13/2013   Primary osteoarthritis of left knee 09/13/2013   Morbid obesity (HCC) 09/13/2013   Seizures (HCC)    Hypertension    Past Medical History:  Diagnosis Date   Chronic back pain 09/13/2013   Chronic pain syndrome 09/13/2013   GERD (gastroesophageal reflux disease)    hx. of   Gout 09/13/2013   Hemorrhoids    Hypertension    Morbid obesity with body mass index of 45.0-49.9 in adult [facility]) 09/13/2013   Osteoarthritis 09/13/2013   Pneumonia    hx. of   Seizures (HCC)    Sleep apnea    has never used C-pap machine due to insurance cost   Type 1 diabetes mellitus, uncontrolled     Family History  Problem Relation Age of Onset   Alcohol abuse Father    Arthritis Maternal Grandmother    Alcohol abuse Paternal Grandmother    Alcohol abuse Paternal Grandfather    Diabetes Maternal Aunt     Past Surgical History:  Procedure Laterality Date   broken toe 35 years ago     COLONOSCOPY WITH PROPOFOL N/A 12/15/2022   Procedure: COLONOSCOPY WITH PROPOFOL;  Surgeon: Wyline Mood, MD;  Location: [facility] ENDOSCOPY;  Service: Gastroenterology;  Laterality: N/A;   IR EMBO ARTERIAL NOT HEMORR HEMANG INC GUIDE ROADMAPPING  04/27/2023   IR RADIOLOGIST EVAL & MGMT  03/29/2023   IR RADIOLOGIST EVAL & MGMT  08/02/2023   TOTAL KNEE ARTHROPLASTY Right 05/01/2015   Procedure: RIGHT TOTAL KNEE ARTHROPLASTY;  Surgeon: Kathryne Hitch, MD;  Location: [facility] ORS;  Service: Orthopedics;  Laterality: Right;   WISDOM TOOTH EXTRACTION     Social History   Occupational History   Occupation: Retired  Tobacco Use   Smoking status: Former    Current packs/day: 0.00    Types: Cigarettes    Quit date: 03/08/2004    Years since quitting: 19.5   Smokeless tobacco: Never  Vaping Use   Vaping status: Never Used  Substance and Sexual Activity   Alcohol use: No    Comment: rare   Drug use: No   Sexual activity: Yes     Partners: Female

## 2023-09-27 ENCOUNTER — Ambulatory Visit: Payer: Medicare Other | Admitting: Family

## 2023-09-27 DIAGNOSIS — L97309 Non-pressure chronic ulcer of unspecified ankle with unspecified severity: Secondary | ICD-10-CM | POA: Diagnosis not present

## 2023-09-27 DIAGNOSIS — E11622 Type 2 diabetes mellitus with other skin ulcer: Secondary | ICD-10-CM

## 2023-09-27 MED ORDER — SULFAMETHOXAZOLE-TRIMETHOPRIM 800-160 MG PO TABS: 1.0000 | ORAL_TABLET | Freq: Two times a day (BID) | ORAL | 0 refills | Status: DC

## 2023-09-27 NOTE — Progress Notes (Unsigned)
Office Visit Note   Patient: Christian [name]           Date of Birth: 1951-10-10           MRN: [id number] Visit Date: 09/27/2023              Requested by: Monica Becton, MD [address] [place],  [place] [address] PCP: Monica Becton, MD  Chief Complaint  Patient presents with   Right Leg - Open Wound      HPI: The patient is a 72 year old gentleman seen in referral from Dr. August Saucer for concern of an ulcer to his right lower extremity in the area of his anterior ankle.  He has completed a course of doxycycline the patient feels that the ulcer has not improved reports the surrounding redness has not decreased he denies fever or chills  Reports has seen a vein specialist but however has not had any ABIs in the past.  Would be open to seeing a new vascular surgeon  History of venous insufficiency  Assessment & Plan: Visit Diagnoses:  1. Diabetic ankle ulcer (HCC)     Plan: Will call in some Bactrim.  Have also referred him to vascular surgery for ABIs bilateral lower extremities.  He will continue with his mupirocin dressing changes daily  Follow-Up Instructions: No follow-ups on file.   Ortho Exam  Patient is alert, oriented, no adenopathy, well-dressed, normal affect, normal respiratory effort. On examination right lower extremity there is hemosiderin staining consistent with venous insufficiency there is no edema or dating.  No weeping.  He does have one 5 mm in diameter ulcer over the anterior lower leg this is surrounded by mild erythema and some dry peeling skin there is no warmth no drainage  Imaging: No results found. No images are attached to the encounter.  Labs: Lab Results  Component Value Date   HGBA1C 6.3 (A) 07/14/2023   HGBA1C 6.7 (A) 01/30/2023   HGBA1C 7.0 (A) 10/03/2022     Lab Results  Component Value Date   ALBUMIN 4.2 05/14/2021   ALBUMIN 3.9 08/31/2017   ALBUMIN 4.3 09/21/2016    No results found for:  "MG" Lab Results  Component Value Date   VD25OH 14 (L) 01/02/2019   VD25OH 11 (L) 07/02/2018    No results found for: "PREALBUMIN"    Latest Ref Rng & Units 04/27/2023    8:10 AM 01/02/2019   11:13 AM 07/02/2018   11:58 AM  CBC EXTENDED  WBC 4.0 - 10.5 K/uL 12.1  11.2  10.2   RBC 4.22 - 5.81 MIL/uL 4.41  4.03  4.05   Hemoglobin 13.0 - 17.0 g/dL 86.5  78.4  69.6   HCT 39.0 - 52.0 % 42.8  37.4  37.1   Platelets 150 - 400 K/uL 245  249  238   NEUT# 1.7 - 7.7 K/uL 8.4  7,896  7,007   Lymph# 0.7 - 4.0 K/uL 2.3  2,150  2,060      There is no height or weight on file to calculate BMI.  Orders:  Orders Placed This Encounter  Procedures   VAS [place] ABI WITH/WO TBI   Meds ordered this encounter  Medications   sulfamethoxazole-trimethoprim (BACTRIM DS) 800-160 MG tablet    Sig: Take 1 tablet by mouth 2 (two) times daily.    Dispense:  20 tablet    Refill:  0     Procedures: No procedures performed  Clinical Data: No  additional findings.  ROS:  All other systems negative, except as noted in the HPI. Review of Systems  Objective: Vital Signs: There were no vitals taken for this visit.  Specialty Comments:  No specialty comments available.  PMFS History: Patient Active Problem List   Diagnosis Date Noted   Venous stasis ulcer, left lower extremity with lymphedema 02/07/2023   Insulin dependent type 2 diabetes mellitus (HCC) 01/02/2023   Impingement syndrome, shoulder, right 10/15/2021   Olecranon bursitis, left elbow 09/08/2021   Encounter for screening colonoscopy 06/01/2021   Polyneuropathy associated with underlying disease (HCC) 05/08/2019   Chronic left shoulder pain 01/02/2019   Hyperlipidemia associated with type 2 diabetes mellitus (HCC) 09/03/2017   Uncontrolled type 2 diabetes mellitus with peripheral neuropathy 01/27/2016   Status post total right knee replacement 05/01/2015   Gout 09/13/2013   Chronic pain syndrome 09/13/2013   Primary osteoarthritis of  left knee 09/13/2013   Morbid obesity (HCC) 09/13/2013   Seizures (HCC)    Hypertension    Past Medical History:  Diagnosis Date   Chronic back pain 09/13/2013   Chronic pain syndrome 09/13/2013   GERD (gastroesophageal reflux disease)    hx. of   Gout 09/13/2013   Hemorrhoids    Hypertension    Morbid obesity with body mass index of 45.0-49.9 in adult [facility]) 09/13/2013   Osteoarthritis 09/13/2013   Pneumonia    hx. of   Seizures (HCC)    Sleep apnea    has never used C-pap machine due to insurance cost   Type 1 diabetes mellitus, uncontrolled     Family History  Problem Relation Age of Onset   Alcohol abuse Father    Arthritis Maternal Grandmother    Alcohol abuse Paternal Grandmother    Alcohol abuse Paternal Grandfather    Diabetes Maternal Aunt     Past Surgical History:  Procedure Laterality Date   broken toe 35 years ago     COLONOSCOPY WITH PROPOFOL N/A 12/15/2022   Procedure: COLONOSCOPY WITH PROPOFOL;  Surgeon: Wyline Mood, MD;  Location: [facility] ENDOSCOPY;  Service: Gastroenterology;  Laterality: N/A;   IR EMBO ARTERIAL NOT HEMORR HEMANG INC GUIDE ROADMAPPING  04/27/2023   IR RADIOLOGIST EVAL & MGMT  03/29/2023   IR RADIOLOGIST EVAL & MGMT  08/02/2023   TOTAL KNEE ARTHROPLASTY Right 05/01/2015   Procedure: RIGHT TOTAL KNEE ARTHROPLASTY;  Surgeon: Kathryne Hitch, MD;  Location: [facility]S;  Service: Orthopedics;  Laterality: Right;   WISDOM TOOTH EXTRACTION     Social History   Occupational History   Occupation: Retired  Tobacco Use   Smoking status: Former    Current packs/day: 0.00    Types: Cigarettes    Quit date: 03/08/2004    Years since quitting: 19.5   Smokeless tobacco: Never  Vaping Use   Vaping status: Never Used  Substance and Sexual Activity   Alcohol use: No    Comment: rare   Drug use: No   Sexual activity: Yes    Partners: Female

## 2023-09-28 ENCOUNTER — Ambulatory Visit
Admission: RE | Admit: 2023-09-28 | Discharge: 2023-09-28 | Disposition: A | Discharge status: Home / Self Care | Payer: Medicare Other | Source: Ambulatory Visit | Attending: Orthopedic Surgery | Admitting: Orthopedic Surgery

## 2023-09-28 ENCOUNTER — Encounter: Payer: Self-pay | Admitting: Family

## 2023-09-28 DIAGNOSIS — M75101 Unspecified rotator cuff tear or rupture of right shoulder, not specified as traumatic: Secondary | ICD-10-CM | POA: Diagnosis not present

## 2023-09-28 DIAGNOSIS — G8929 Other chronic pain: Secondary | ICD-10-CM

## 2023-09-28 DIAGNOSIS — M19011 Primary osteoarthritis, right shoulder: Secondary | ICD-10-CM | POA: Diagnosis not present

## 2023-10-03 ENCOUNTER — Ambulatory Visit (HOSPITAL_COMMUNITY)
Admission: RE | Admit: 2023-10-03 | Discharge: 2023-10-03 | Disposition: A | Discharge status: Home / Self Care | Payer: Medicare Other | Source: Ambulatory Visit | Attending: Internal Medicine | Admitting: Internal Medicine

## 2023-10-03 DIAGNOSIS — L97309 Non-pressure chronic ulcer of unspecified ankle with unspecified severity: Secondary | ICD-10-CM | POA: Insufficient documentation

## 2023-10-03 DIAGNOSIS — L97318 Non-pressure chronic ulcer of right ankle with other specified severity: Secondary | ICD-10-CM | POA: Diagnosis not present

## 2023-10-03 DIAGNOSIS — E11622 Type 2 diabetes mellitus with other skin ulcer: Secondary | ICD-10-CM | POA: Diagnosis not present

## 2023-10-04 LAB — VAS US ABI WITH/WO TBI

## 2023-10-25 ENCOUNTER — Encounter: Payer: Self-pay | Admitting: Surgical

## 2023-10-25 ENCOUNTER — Ambulatory Visit (INDEPENDENT_AMBULATORY_CARE_PROVIDER_SITE_OTHER): Payer: Medicare Other | Admitting: Surgical

## 2023-10-25 DIAGNOSIS — M12811 Other specific arthropathies, not elsewhere classified, right shoulder: Secondary | ICD-10-CM

## 2023-10-25 DIAGNOSIS — M12812 Other specific arthropathies, not elsewhere classified, left shoulder: Secondary | ICD-10-CM | POA: Diagnosis not present

## 2023-10-25 NOTE — Progress Notes (Signed)
Follow-up Office Visit Note   Patient: Christian [name]           Date of Birth: 1951/10/11           MRN: [id number] Visit Date: 10/25/2023 Requested by: Monica Becton, MD [address] [place],  [place] [address] PCP: Monica Becton, MD  Subjective: Chief Complaint  Patient presents with   Right Shoulder - Follow-up    CT REVIEW RT SHOULDER    HPI: Christian [name] is a 72 y.o. male who returns to the office for follow-up visit.    Plan at last visit was: Impression is end-stage shoulder arthritis and rotator cuff arthropathy on the right-hand side with significant limitation of function. I think he would be a reasonable candidate for reverse shoulder replacement. We would need to consider his upper extremity weightbearing status when doing preoperative templating. More pressing is this ulcer on his right leg which is below the total knee prosthesis. Plan refer him to Dr. Lajoyce Corners for treatment of that ulcer. Anticipate resolution so that we could get him fixed in January. The risk and benefits of reverse shoulder replacement are discussed with the patient include not limited to infection nerve or vessel damage instability as well as incomplete pain relief and incomplete restitution of function. All questions answered. Thin cut CT scan pending for patient specific instrumentation.   Since then, patient notes he saw Barnie Del for evaluation of leg ulcer.  He was instructed to apply mupirocin dressing changes.  He feels that this has helped and the ulcer has decreased significantly in size.  He denies any fevers or chills.  No drainage from the ulcer.  He is also here today to review his right shoulder CT scan in order to discuss reverse shoulder replacement.  Currently scheduled for reverse shoulder replacement on 12/05/2023.              ROS: All systems reviewed are negative as they relate to the chief complaint within the history of present illness.  Patient  denies fevers or chills.  Assessment & Plan: Visit Diagnoses:  1. Rotator cuff arthropathy of both shoulders     Plan: Christian [name] is a 72 y.o. male who returns to the office for follow-up visit.  Plan from last visit was noted above in HPI.  They now return with improvement in right shin wound.  Recommended he continue his mupirocin dressing changes which have been working for him.  He has upcoming reverse shoulder replacement on 12/05/2023.  Answered all questions about recovery as requested by patient.  He will follow-up after procedure but strongly encouraged him to reach out with any concerns he has in the next month and to keep an eye on his ulcer to make sure it does not get any bigger and continues to heal.  Follow-Up Instructions: No follow-ups on file.   Orders:  No orders of the defined types were placed in this encounter.  No orders of the defined types were placed in this encounter.     Procedures: No procedures performed   Clinical Data: No additional findings.  Objective: Vital Signs: There were no vitals taken for this visit.  Physical Exam:  Constitutional: Patient appears well-developed HEENT:  Head: Normocephalic Eyes:EOM are normal Neck: Normal range of motion Cardiovascular: Normal rate Pulmonary/chest: Effort normal Neurologic: Patient is alert Skin: Skin is warm Psychiatric: Patient has normal mood and affect  Ortho Exam: Ortho exam demonstrates right leg with  small area of eschar in the anterior aspect of the distal shin.  There is a small amount of surrounding erythema but no fluctuance.  No expressible drainage.  Really no tenderness to palpation.  No erythema in any other portion of the leg.  There is significant pitting edema.  Right shoulder with severely limited active and passive range of motion.  Really has no active external rotation with positive external rotation lag sign.  2+ radial pulse of the right upper extremity.  Intact EPL,  FPL, finger abduction, pronation/supination, bicep, tricep, deltoid.  Axillary nerve is intact with deltoid firing.  No significant active abduction of the right shoulder either.  Specialty Comments:  No specialty comments available.  Imaging: No results found.   PMFS History: Patient Active Problem List   Diagnosis Date Noted   Venous stasis ulcer, left lower extremity with lymphedema 02/07/2023   Insulin dependent type 2 diabetes mellitus (HCC) 01/02/2023   Impingement syndrome, shoulder, right 10/15/2021   Olecranon bursitis, left elbow 09/08/2021   Encounter for screening colonoscopy 06/01/2021   Polyneuropathy associated with underlying disease (HCC) 05/08/2019   Chronic left shoulder pain 01/02/2019   Hyperlipidemia associated with type 2 diabetes mellitus (HCC) 09/03/2017   Uncontrolled type 2 diabetes mellitus with peripheral neuropathy 01/27/2016   Status post total right knee replacement 05/01/2015   Gout 09/13/2013   Chronic pain syndrome 09/13/2013   Primary osteoarthritis of left knee 09/13/2013   Morbid obesity (HCC) 09/13/2013   Seizures (HCC)    Hypertension    Past Medical History:  Diagnosis Date   Chronic back pain 09/13/2013   Chronic pain syndrome 09/13/2013   GERD (gastroesophageal reflux disease)    hx. of   Gout 09/13/2013   Hemorrhoids    Hypertension    Morbid obesity with body mass index of 45.0-49.9 in adult [facility]) 09/13/2013   Osteoarthritis 09/13/2013   Pneumonia    hx. of   Seizures (HCC)    Sleep apnea    has never used C-pap machine due to insurance cost   Type 1 diabetes mellitus, uncontrolled     Family History  Problem Relation Age of Onset   Alcohol abuse Father    Arthritis Maternal Grandmother    Alcohol abuse Paternal Grandmother    Alcohol abuse Paternal Grandfather    Diabetes Maternal Aunt     Past Surgical History:  Procedure Laterality Date   broken toe 35 years ago     COLONOSCOPY WITH PROPOFOL N/A 12/15/2022    Procedure: COLONOSCOPY WITH PROPOFOL;  Surgeon: Wyline Mood, MD;  Location: [facility] ENDOSCOPY;  Service: Gastroenterology;  Laterality: N/A;   IR EMBO ARTERIAL NOT HEMORR HEMANG INC GUIDE ROADMAPPING  04/27/2023   IR RADIOLOGIST EVAL & MGMT  03/29/2023   IR RADIOLOGIST EVAL & MGMT  08/02/2023   TOTAL KNEE ARTHROPLASTY Right 05/01/2015   Procedure: RIGHT TOTAL KNEE ARTHROPLASTY;  Surgeon: Kathryne Hitch, MD;  Location: [facility] ORS;  Service: Orthopedics;  Laterality: Right;   WISDOM TOOTH EXTRACTION     Social History   Occupational History   Occupation: Retired  Tobacco Use   Smoking status: Former    Current packs/day: 0.00    Types: Cigarettes    Quit date: 03/08/2004    Years since quitting: 19.6   Smokeless tobacco: Never  Vaping Use   Vaping status: Never Used  Substance and Sexual Activity   Alcohol use: No    Comment: rare   Drug use: No   Sexual activity:  Yes    Partners: Female

## 2023-11-07 ENCOUNTER — Other Ambulatory Visit: Payer: Self-pay | Admitting: Family

## 2023-11-14 ENCOUNTER — Ambulatory Visit: Payer: Medicare Other | Admitting: Internal Medicine

## 2023-11-16 ENCOUNTER — Encounter: Payer: Self-pay | Admitting: Internal Medicine

## 2023-11-16 ENCOUNTER — Telehealth (INDEPENDENT_AMBULATORY_CARE_PROVIDER_SITE_OTHER): Payer: Medicare Other | Admitting: Internal Medicine

## 2023-11-16 DIAGNOSIS — E1165 Type 2 diabetes mellitus with hyperglycemia: Secondary | ICD-10-CM | POA: Diagnosis not present

## 2023-11-16 DIAGNOSIS — E1169 Type 2 diabetes mellitus with other specified complication: Secondary | ICD-10-CM

## 2023-11-16 DIAGNOSIS — Z7984 Long term (current) use of oral hypoglycemic drugs: Secondary | ICD-10-CM

## 2023-11-16 DIAGNOSIS — E1142 Type 2 diabetes mellitus with diabetic polyneuropathy: Secondary | ICD-10-CM

## 2023-11-16 DIAGNOSIS — Z6841 Body Mass Index (BMI) 40.0 and over, adult: Secondary | ICD-10-CM

## 2023-11-16 DIAGNOSIS — E785 Hyperlipidemia, unspecified: Secondary | ICD-10-CM | POA: Diagnosis not present

## 2023-11-16 NOTE — Patient Instructions (Addendum)
 Please continue: - Metformin 2000 mg with the first meal of the day  Change: - U500 30 min before meals  80 units before 1st meal of the day  30-35 units before dinner  Please return in 3-4 months.

## 2023-11-16 NOTE — Progress Notes (Signed)
 Patient ID: Christian [name], male   DOB: 09/22/1951, 73 y.o.   MRN: [id number]  Patient location: Home My location: Office Persons participating in the virtual visit: patient, provider  Referring Provider: Curtis Debby PARAS, MD  I connected with the patient on 11/16/23 at 11:20 AM EST by a video enabled telemedicine application and verified that I am speaking with the correct person.   I discussed the limitations of evaluation and management by telemedicine and the availability of in person appointments. The patient expressed understanding and agreed to proceed.   Details of the encounter are shown below. HPI Christian [name] is a 73 y.o.-year-old male, returning for f/u for DM2, dx 2009, insulin -dependent since 2014, uncontrolled, with complications (DR, Peripheral neuropathy). Last visit: 5 months ago. He is here with his wife, who is also my patient. [name].    Interim history: No increased urination, blurry vision, nausea, chest pain. For last visit he lost 21 pounds in the previous 6 months preparing for knee surgery.  He had a nerve block reportedly in his knee.  He will have shoulder surgery towards the end of the month.  Reviewed HbA1c levels: Lab Results  Component Value Date   HGBA1C 6.3 (A) 07/14/2023   HGBA1C 6.7 (A) 01/30/2023   HGBA1C 7.0 (A) 10/03/2022   HGBA1C 6.5 (A) 05/24/2022   HGBA1C 7.1 (A) 01/17/2022   HGBA1C 8.4 (A) 09/14/2021   HGBA1C 7.7 (A) 05/14/2021   HGBA1C 7.5 (A) 01/12/2021   HGBA1C 7.8 (A) 09/08/2020   HGBA1C 7.0 (A) 05/14/2020   HGBA1C 7.5 (A) 01/09/2020   HGBA1C 6.9 (A) 09/09/2019   HGBA1C 7.6 (A) 05/08/2019   HGBA1C 8.2 (A) 01/08/2019   HGBA1C 7.7 (A) 09/25/2018   HGBA1C 7.4 (A) 05/25/2018   HGBA1C 9.0 02/22/2018   HGBA1C 9.1 11/23/2017   HGBA1C 9 08/24/2017   HGBA1C 8.4 05/25/2017  08/24/2017: HbA1c 9%  Pt is on: - Metformin  1000 mg 2x a daily >> 2000 mg with dinner >> at bedtime >> in am - U500 30 min before meals -  started 09/2021: 75 >> 80 units before 1st meal of the day 30 >> 40 units before dinner 30-40 >> 40 >> 30-40 units at bedtime >> stopped Stopped Jardiance  25 mg >> but too expensive - in the donut hole; had yeast infections. Glipizide  and Invokana  were started 03/2014.  He has been on Victoza in the past >> not efficient anymore. We stopped glipizide  in 01/2022. Previously on Lantus  and Humalog  high doses. He stopped Trulicity  01/2022 due to price.  Pt checks his sugars >4x day:  Previously:  Prev.: - am:  74, 90-135 >> 90-146, 230 >> 78, 97-140s, 155, 188 - 2h after b'fast: 157 >> n/c >> 172 >> 120 >> 121, 184 >> n/c - before lunch: 280 >> 125 >> 190-200 >> n/c >> 102 >> n/c - 2h after lunch: 220-230 >> n/c >> 190-220 >> n/c - before dinner: n/c >> 200s >> n/c  >> 150-185 >> 164 >> n/c - 2h after dinner: n/c >> 74 >> n/c - bedtime: 230, 281 >> 180s, 333 >> n/c >> 200, 331 >> n/c - nighttime: n/c >> 65 >> 57 - 1x a mo >> 79 x 1 at night Lowest sugar was  57 >> 78 >> 50s >> 50s, he has hypoglycemia awareness in the 90s Highest sugar was 331 >> .[id number] (steroid inj) >> 181 >> 200 >> 200s.  Meter: OneTouch Ultra Mini >> [name]  -No  CKD, last BUN/creatinine:  Lab Results  Component Value Date   BUN 21 04/27/2023   CREATININE 0.94 04/27/2023  Not on ACE inhibitor/ARB.  Normal ACR: Lab Results  Component Value Date   MICRALBCREAT 0.6 05/24/2022   MICRALBCREAT 1.1 05/14/2021   MICRALBCREAT 0.7 09/09/2019   MICRALBCREAT 1.1 08/31/2017   MICRALBCREAT 0.8 08/20/2015   MICRALBCREAT 0.3 09/11/2013   Normal GFR levels: Lab Results  Component Value Date   GFRNONAA >60 04/27/2023   GFRNONAA 84 09/09/2019   GFRNONAA >60 05/02/2015   GFRNONAA >60 04/27/2015   -+ HL; last set of lipids: Lab Results  Component Value Date   CHOL 137 07/14/2023   HDL 39.10 07/14/2023   LDLCALC 74 07/14/2023   TRIG 117.0 07/14/2023   CHOLHDL 4 07/14/2023  No statins due to leg cramps.  - last  eye exam was 12/07/2022: no D, prev. reportedly + DR  -+ numbness and tingling in his feet.  Last foot exam 07/14/2023.  He had R TKR in 04/2015.  He has steroid inj in L knee and L shoulder.  Also uses CBD oil liniment and this helps. She has OSA.  ROS: + See HPI  I reviewed pt's medications, allergies, PMH, social hx, family hx, and changes were documented in the history of present illness. Otherwise, unchanged from my initial visit note.  Past Medical History:  Diagnosis Date   Chronic back pain 09/13/2013   Chronic pain syndrome 09/13/2013   GERD (gastroesophageal reflux disease)    hx. of   Gout 09/13/2013   Hemorrhoids    Hypertension    Morbid obesity with body mass index of 45.0-49.9 in adult [facility]) 09/13/2013   Osteoarthritis 09/13/2013   Pneumonia    hx. of   Seizures (HCC)    Sleep apnea    has never used C-pap machine due to insurance cost   Type 1 diabetes mellitus, uncontrolled    Past Surgical History:  Procedure Laterality Date   broken toe 35 years ago     COLONOSCOPY WITH PROPOFOL  N/A 12/15/2022   Procedure: COLONOSCOPY WITH PROPOFOL ;  Surgeon: Therisa Bi, MD;  Location: [facility] ENDOSCOPY;  Service: Gastroenterology;  Laterality: N/A;   IR EMBO ARTERIAL NOT HEMORR HEMANG INC GUIDE ROADMAPPING  04/27/2023   IR RADIOLOGIST EVAL & MGMT  03/29/2023   IR RADIOLOGIST EVAL & MGMT  08/02/2023   TOTAL KNEE ARTHROPLASTY Right 05/01/2015   Procedure: RIGHT TOTAL KNEE ARTHROPLASTY;  Surgeon: Lonni CINDERELLA Poli, MD;  Location: [facility] ORS;  Service: Orthopedics;  Laterality: Right;   WISDOM TOOTH EXTRACTION     Social History   Socioeconomic History   Marital status: Married    Spouse name: [name]   Number of children: 3   Years of education: 14   Highest education level: Some college, no degree  Occupational History   Occupation: Retired  Tobacco Use   Smoking status: Former    Current packs/day: 0.00    Types: Cigarettes    Quit date: 03/08/2004    Years since quitting:  19.7   Smokeless tobacco: Never  Vaping Use   Vaping status: Never Used  Substance and Sexual Activity   Alcohol use: No    Comment: rare   Drug use: No   Sexual activity: Yes    Partners: Female  Other Topics Concern   Not on file  Social History Narrative   Lives with wife. He has three children. He enjoys diplomatic services operational officer.   Social Drivers of Health  Financial Resource Strain: Low Risk  (12/02/2022)   Overall Financial Resource Strain (CARDIA)    Difficulty of Paying Living Expenses: Not hard at all  Food Insecurity: No Food Insecurity (12/02/2022)   Hunger Vital Sign    Worried About Running Out of Food in the Last Year: Never true    Ran Out of Food in the Last Year: Never true  Transportation Needs: No Transportation Needs (12/02/2022)   PRAPARE - Administrator, Civil Service (Medical): No    Lack of Transportation (Non-Medical): No  Physical Activity: Inactive (12/02/2022)   Exercise Vital Sign    Days of Exercise per Week: 0 days    Minutes of Exercise per Session: 0 min  Stress: No Stress Concern Present (12/02/2022)   [employer] of Occupational Health - Occupational Stress Questionnaire    Feeling of Stress : Not at all  Social Connections: Unknown (03/28/2023)   Received from [facility]   Social Network    Social Network: Not on file  Intimate Partner Violence: Unknown (03/28/2023)   [facility]   HITS    Physically Hurt: Not on file    Insult or Talk Down To: Not on file    Threaten Physical Harm: Not on file    Scream or Curse: Not on file   Current Outpatient Medications on File Prior to Visit  Medication Sig Dispense Refill   aspirin  81 MG tablet Take 81 mg by mouth once.     BD PEN NEEDLE NANO 2ND GEN 32G X 4 MM MISC USE AS DIRECTED 5 TIMES A DAY WITH INSULIN  INJECTIONS 200 each 5   bisoprolol  (ZEBETA ) 5 MG tablet Take 1 PO qd x 1 week, then 1/2 PO qd x 1 week, then 1/2 PO  qod x 1 week, then stop 30 tablet 0   Continuous Glucose Sensor (FREESTYLE LIBRE 3 SENSOR) MISC by Does not apply route. Use to check glucose continuously change every 14 days     diclofenac  (VOLTAREN ) 75 MG EC tablet TAKE 1 TABLET (75 MG TOTAL) BY MOUTH 2 (TWO) TIMES DAILY. WITH OMEPRAZOLE  180 tablet 3   doxycycline  (VIBRA -TABS) 100 MG tablet Take 1 tablet (100 mg total) by mouth 2 (two) times daily. 14 tablet 0   DULoxetine  (CYMBALTA ) 60 MG capsule Take 60 mg by mouth daily.     hydrochlorothiazide  (HYDRODIURIL ) 12.5 MG tablet TAKE 1/2 TO 1 TABLET BY MOUTH EVERY DAY AS NEEDED FOR EDEMA 90 tablet 2   insulin  regular human CONCENTRATED (HUMULIN  R U-500 KWIKPEN) 500 UNIT/ML KwikPen INJECT 200 UNITS UNDER SKIN A DAY AS ADVISED 24 mL 3   metFORMIN  (GLUCOPHAGE ) 1000 MG tablet TAKE 1 TABLET BY MOUTH TWICE  DAILY WITH A MEAL 180 tablet 1   montelukast  (SINGULAIR ) 10 MG tablet TAKE 1 TABLET BY MOUTH AT BEDTIME AS NEEDED. 90 tablet 2   omeprazole  (PRILOSEC) 20 MG capsule Take 1 capsule (20 mg total) by mouth daily. With diclofenac  90 capsule 3   sulfamethoxazole -trimethoprim  (BACTRIM  DS) 800-160 MG tablet Take 1 tablet by mouth 2 (two) times daily. 20 tablet 0   No current facility-administered medications on file prior to visit.   Allergies  Allergen Reactions   Penicillins     REACTION: swelling, rash   Family History  Problem Relation Age of Onset   Alcohol abuse Father    Arthritis Maternal Grandmother    Alcohol abuse Paternal Grandmother    Alcohol abuse Paternal Actor  Diabetes Maternal Aunt    PE: There were no vitals taken for this visit. 09/2023: 310 lbs Wt Readings from Last 3 Encounters:  07/14/23 (!) 323 lb (146.5 kg)  04/27/23 235 lb (106.6 kg)  01/02/23 (!) 344 lb (156 kg)   Constitutional:  in NAD  The physical exam was not performed (virtual visit).  ASSESSMENT: 1. DM2, insulin -dependent, uncontrolled, with complications - PN - stable, worse in L foot -  DR  2. PN  3.  Obesity class III  PLAN:  1. Patient with history of uncontrolled type 2 diabetes, previously on a complex medication regimen currently on metformin  and U-500 insulin .  Trulicity  was not covered for him so he had to stop.  Per his preference we did not try to start another GLP-1 receptor agonist.  At last visit, after starting the CGM, his HbA1c improved and he was close in a long time, at 6.3%.  His sugars were dropping abruptly overnight with a nadir between 5 AM and 9 AM and increasing afterwards, with the hyperglycemic peak around 12 AM.  He was keeping his insulin  at night and even for the dinnertime insulin , I advised him to reduce the dose.  We also reduced his insulin  with the first meal of the day.  We moved to metformin  with the first meal of the day.  I advised him to let me know if he had any more low blood sugars. CGM interpretation: -At today's visit, we reviewed his CGM downloads: It appears that 79% of values are in target range (goal >70%), while 90% are higher than 180 (goal <25%), and 2% are lower than 70 (goal <4%).  The calculated average blood sugar is 144.  The projected HbA1c for the next 3 months (GMI) is 6.8%. -Reviewing the CGM trends, sugars are mostly fluctuating within the target range, with only occasional higher blood sugars after lunch, but more consistent hyperglycemia after dinner, peaking between 11 PM to 2 PM.  Afterwards, sugars are dropping with occasional low blood sugars between 5 and 6 in the morning.  He confirms that he is not taking insulin  at bedtime. -He increased his U-500 insulin  doses since last visit.  At today's visit, we did discuss about taking his U-500 insulin  30 to 45 minutes before dinner, and I also advised him to reduce the dose slightly to avoid low blood sugars at night.  Otherwise, we can continue the current regimen.  - I advised him to: Patient Instructions  Please continue: - Metformin  2000 mg with the first meal of the  day  Change: - U500 30 min before meals  80 units before 1st meal of the day  30-35 units before dinner  Please return in 3-4 months.  - we will check his HbA1c at next visit. - advised to check sugars at different times of the day - 4x a day, rotating check times - advised for yearly eye exams >> he is UTD - return to clinic in 3-4 months  2. HL -At last visit, LDL was slightly elevated above target, otherwise fractions at goal: Lab Results  Component Value Date   CHOL 137 07/14/2023   HDL 39.10 07/14/2023   LDLCALC 74 07/14/2023   TRIG 117.0 07/14/2023   CHOLHDL 4 07/14/2023  -He could not tolerate statins due to leg cramps.  I suggested ezetimibe but he did not start yet.  3.  Obesity class III -Previously saw nutrition -Unfortunately he had to come off Trulicity  due to price -In  preparation for the clinic surgery, he lost 21 pounds in the 6 months prior to our last visit -did not  weigh himself since Thanksgiving, but at that time, his weight was 310, 13 pounds under the weight obtained in clinic at last visit  Lela Fendt, MD PhD [facility] Endocrinology

## 2023-11-20 ENCOUNTER — Encounter: Payer: Self-pay | Admitting: Orthopedic Surgery

## 2023-11-22 DIAGNOSIS — M170 Bilateral primary osteoarthritis of knee: Secondary | ICD-10-CM | POA: Diagnosis not present

## 2023-11-22 DIAGNOSIS — M25511 Pain in right shoulder: Secondary | ICD-10-CM | POA: Diagnosis not present

## 2023-11-22 DIAGNOSIS — G894 Chronic pain syndrome: Secondary | ICD-10-CM | POA: Diagnosis not present

## 2023-11-22 DIAGNOSIS — E1140 Type 2 diabetes mellitus with diabetic neuropathy, unspecified: Secondary | ICD-10-CM | POA: Diagnosis not present

## 2023-11-22 DIAGNOSIS — Z96651 Presence of right artificial knee joint: Secondary | ICD-10-CM | POA: Diagnosis not present

## 2023-11-27 NOTE — Progress Notes (Signed)
Surgical Instructions   Your procedure is scheduled on Tuesday December 05, 2023. Report to [facility] Main Entrance "A" at 10:15 A.M., then check in with the Admitting office. Any questions or running late day of surgery: call [phone]  Questions prior to your surgery date: call [phone], Monday-Friday, 8am-4pm. If you experience any cold or flu symptoms such as cough, fever, chills, shortness of breath, etc. between now and your scheduled surgery, please notify us at the above number.     Remember:  Do not eat after midnight the night before your surgery  You may drink clear liquids until 9:15 the morning of your surgery.   Clear liquids allowed are: Water, Non-Citrus Juices (without pulp), Carbonated Beverages, Clear Tea (no milk, honey, etc.), Black Coffee Only (NO MILK, CREAM OR POWDERED CREAMER of any kind), and Gatorade.  Patient Instructions  The night before surgery:  No food after midnight. ONLY clear liquids after midnight  The day of surgery (if you have diabetes): Drink ONE (1) 12 oz G2 given to you in your pre admission testing appointment by 9:15 the morning of surgery. Drink in one sitting. Do not sip.  This drink was given to you during your hospital  pre-op appointment visit.  Nothing else to drink after completing the  12 oz bottle of G2.         If you have questions, please contact your surgeon's office.     Take these medicines the morning of surgery with A SIP OF WATER  DULoxetine (CYMBALTA)   May take these medicines IF NEEDED: omeprazole (PRILOSEC)   Follow your surgeon's instructions on when to stop Asprin.  If no instructions were given by your surgeon then you will need to call the office to get those instructions.    One week prior to surgery, STOP taking any Aleve, Naproxen, Ibuprofen, Motrin, Advil, Goody's, BC's, all herbal medications, fish oil, and non-prescription vitamins.  This includes your diclofenac (VOLTAREN).     WHAT DO I  DO ABOUT MY DIABETES MEDICATION?   Do not take oral diabetes medicines (pills) the morning of surgery.        DO NOT TAKE YOUR metFORMIN (GLUCOPHAGE) THE MORNING OF SURGERY   THE NIGHT BEFORE AND THE MORNING OF SURGERY, take ZERO UNITS OF insulin regular human CONCENTRATED (HUMULIN R U-500 KWIKPEN).      The day of surgery, do not take other diabetes injectables, including Byetta (exenatide), Bydureon (exenatide ER), Victoza (liraglutide), or Trulicity (dulaglutide).  If your CBG is greater than 220 mg/dL, you may take  of your sliding scale (correction) dose of insulin.   HOW TO MANAGE YOUR DIABETES BEFORE AND AFTER SURGERY  Why is it important to control my blood sugar before and after surgery? Improving blood sugar levels before and after surgery helps healing and can limit problems. A way of improving blood sugar control is eating a healthy diet by:  Eating less sugar and carbohydrates  Increasing activity/exercise  Talking with your doctor about reaching your blood sugar goals High blood sugars (greater than 180 mg/dL) can raise your risk of infections and slow your recovery, so you will need to focus on controlling your diabetes during the weeks before surgery. Make sure that the doctor who takes care of your diabetes knows about your planned surgery including the date and location.  How do I manage my blood sugar before surgery? Check your blood sugar at least 4 times a day, starting 2 days before surgery, to make  sure that the level is not too high or low.  Check your blood sugar the morning of your surgery when you wake up and every 2 hours until you get to the Short Stay unit.  If your blood sugar is less than 70 mg/dL, you will need to treat for low blood sugar: Do not take insulin. Treat a low blood sugar (less than 70 mg/dL) with  cup of clear juice (cranberry or apple), 4 glucose tablets, OR glucose gel. Recheck blood sugar in 15 minutes after treatment (to make sure  it is greater than 70 mg/dL). If your blood sugar is not greater than 70 mg/dL on recheck, call [phone] for further instructions. Report your blood sugar to the short stay nurse when you get to Short Stay.  If you are admitted to the hospital after surgery: Your blood sugar will be checked by the staff and you will probably be given insulin after surgery (instead of oral diabetes medicines) to make sure you have good blood sugar levels. The goal for blood sugar control after surgery is 80-180 mg/dL.                      Do NOT Smoke (Tobacco/Vaping) for 24 hours prior to your procedure.  If you use a CPAP at night, you may bring your mask/headgear for your overnight stay.   You will be asked to remove any contacts, glasses, piercing's, hearing aid's, dentures/partials prior to surgery. Please bring cases for these items if needed.    Patients discharged the day of surgery will not be allowed to drive home, and someone needs to stay with them for 24 hours.  SURGICAL WAITING ROOM VISITATION Patients may have no more than 2 support people in the waiting area - these visitors may rotate.   Pre-op nurse will coordinate an appropriate time for 1 ADULT support person, who may not rotate, to accompany patient in pre-op.  Children under the age of 89 must have an adult with them who is not the patient and must remain in the main waiting area with an adult.  If the patient needs to stay at the hospital during part of their recovery, the visitor guidelines for inpatient rooms apply.  Please refer to the [facility] website for the visitor guidelines for any additional information.   If you received a COVID test during your pre-op visit  it is requested that you wear a mask when out in public, stay away from anyone that may not be feeling well and notify your surgeon if you develop symptoms. If you have been in contact with anyone that has tested positive in the last 10 days please notify you  surgeon.                                                                                                                           [place]- Preparing for Total Shoulder Arthroplasty  Before surgery, you can play  an important role. Because skin is not sterile, your skin needs to be as free of germs as possible. You can reduce the number of germs on your skin by using the following products.   Benzoyl Peroxide Gel  o Reduces the number of germs present on the skin  o Applied twice a day to shoulder area starting two days before surgery   Chlorhexidine Gluconate (CHG) Soap (instructions listed above on how to wash with CHG Soap)  o An antiseptic cleaner that kills germs and bonds with the skin to continue killing germs even after washing  o Used for showering the night before surgery and morning of surgery   ==================================================================  Please follow these instructions carefully:  BENZOYL PEROXIDE 5% GEL  Please do not use if you have an allergy to benzoyl peroxide. If your skin becomes reddened/irritated stop using the benzoyl peroxide.  Starting two days before surgery, apply as follows:  1. Apply benzoyl peroxide in the morning and at night. Apply after taking a shower. If you are not taking a shower clean entire shoulder front, back, and side along with the armpit with a clean wet washcloth.  2. Place a quarter-sized dollop on your SHOULDER and rub in thoroughly, making sure to cover the front, back, and side of your shoulder, along with the armpit.   2 Days prior to Surgery First Dose on _____________ Morning Second Dose on ______________ Night  Day Before Surgery First Dose on ______________ Morning Night before surgery wash (entire body except face and private areas) with CHG Soap THEN Second Dose on ____________ Night   Morning of Surgery  wash BODY AGAIN with CHG Soap   4. Do NOT apply benzoyl peroxide gel on the day  of surgery     Pre-operative 5 CHG Bathing Instructions   You can play a key role in reducing the risk of infection after surgery. Your skin needs to be as free of germs as possible. You can reduce the number of germs on your skin by washing with CHG (chlorhexidine gluconate) soap before surgery. CHG is an antiseptic soap that kills germs and continues to kill germs even after washing.   DO NOT use if you have an allergy to chlorhexidine/CHG or antibacterial soaps. If your skin becomes reddened or irritated, stop using the CHG and notify one of our RNs at [phone].   Please shower with the CHG soap starting 4 days before surgery using the following schedule:     Please keep in mind the following:  You may shave your face at any point before/day of surgery.  Place clean sheets on your bed the day you start using CHG soap. Use a clean washcloth (not used since being washed) for each shower. DO NOT sleep with pets once you start using the CHG.   CHG Shower Instructions:  Wash your face and private area with normal soap. If you choose to wash your hair, wash first with your normal shampoo.  After you use shampoo/soap, rinse your hair and body thoroughly to remove shampoo/soap residue.  Turn the water OFF and apply about 3 tablespoons (45 ml) of CHG soap to a CLEAN washcloth.  Apply CHG soap ONLY FROM YOUR NECK DOWN TO YOUR TOES (washing for 3-5 minutes)  DO NOT use CHG soap on face, private areas, open wounds, or sores.  Pay special attention to the area where your surgery is being performed.  If you are having back surgery, having someone wash your back for you  may be helpful. Wait 2 minutes after CHG soap is applied, then you may rinse off the CHG soap.  Pat dry with a clean towel  Put on clean clothes/pajamas   If you choose to wear lotion, please use ONLY the CHG-compatible lotions that are listed below.  Additional instructions for the day of surgery: DO NOT APPLY any lotions,  deodorants or cologne.    Do not bring valuables to the hospital. [facility] is not responsible for any belongings/valuables. Do not wear jewelry Put on clean/comfortable clothes.  Please brush your teeth.  Ask your nurse before applying any prescription medications to the skin.     CHG Compatible Lotions   Aveeno Moisturizing lotion  Cetaphil Moisturizing Cream  Cetaphil Moisturizing Lotion  Clairol Herbal Essence Moisturizing Lotion, Dry Skin  Clairol Herbal Essence Moisturizing Lotion, Extra Dry Skin  Clairol Herbal Essence Moisturizing Lotion, Normal Skin  Curel Age Defying Therapeutic Moisturizing Lotion with Alpha Hydroxy  Curel Extreme Care Body Lotion  Curel Soothing Hands Moisturizing Hand Lotion  Curel Therapeutic Moisturizing Cream, Fragrance-Free  Curel Therapeutic Moisturizing Lotion, Fragrance-Free  Curel Therapeutic Moisturizing Lotion, Original Formula  Eucerin Daily Replenishing Lotion  Eucerin Dry Skin Therapy Plus Alpha Hydroxy Crme  Eucerin Dry Skin Therapy Plus Alpha Hydroxy Lotion  Eucerin Original Crme  Eucerin Original Lotion  Eucerin Plus Crme Eucerin Plus Lotion  Eucerin TriLipid Replenishing Lotion  Keri Anti-Bacterial Hand Lotion  Keri Deep Conditioning Original Lotion Dry Skin Formula Softly Scented  Keri Deep Conditioning Original Lotion, Fragrance Free Sensitive Skin Formula  Keri Lotion Fast Absorbing Fragrance Free Sensitive Skin Formula  Keri Lotion Fast Absorbing Softly Scented Dry Skin Formula  Keri Original Lotion  Keri Skin Renewal Lotion Keri Silky Smooth Lotion  Keri Silky Smooth Sensitive Skin Lotion  Nivea Body Creamy Conditioning Oil  Nivea Body Extra Enriched Lotion  Nivea Body Original Lotion  Nivea Body Sheer Moisturizing Lotion Nivea Crme  Nivea Skin Firming Lotion  NutraDerm 30 Skin Lotion  NutraDerm Skin Lotion  NutraDerm Therapeutic Skin Cream  NutraDerm Therapeutic Skin Lotion  ProShield Protective Hand Cream   Provon moisturizing lotion  Please read over the following fact sheets that you were given.

## 2023-11-28 ENCOUNTER — Encounter (HOSPITAL_COMMUNITY)
Admission: RE | Admit: 2023-11-28 | Discharge: 2023-11-28 | Discharge status: Home / Self Care | Payer: Medicare Other | Source: Ambulatory Visit | Attending: Orthopedic Surgery

## 2023-11-28 ENCOUNTER — Encounter (HOSPITAL_COMMUNITY): Payer: Self-pay

## 2023-11-28 ENCOUNTER — Other Ambulatory Visit: Payer: Self-pay

## 2023-11-28 VITALS — BP 142/90 | HR 100 | Temp 97.9°F | Resp 17 | Ht 67.0 in | Wt 297.0 lb

## 2023-11-28 DIAGNOSIS — Z794 Long term (current) use of insulin: Secondary | ICD-10-CM | POA: Diagnosis not present

## 2023-11-28 DIAGNOSIS — Z01818 Encounter for other preprocedural examination: Secondary | ICD-10-CM | POA: Diagnosis not present

## 2023-11-28 DIAGNOSIS — E119 Type 2 diabetes mellitus without complications: Secondary | ICD-10-CM | POA: Diagnosis not present

## 2023-11-28 LAB — BASIC METABOLIC PANEL
Anion gap: 7 (ref 5–15)
BUN: 15 mg/dL (ref 8–23)
CO2: 27 mmol/L (ref 22–32)
Calcium: 9.3 mg/dL (ref 8.9–10.3)
Chloride: 106 mmol/L (ref 98–111)
Creatinine, Ser: 0.88 mg/dL (ref 0.61–1.24)
GFR, Estimated: >60 mL/min (ref >60)
Glucose, Bld: 110 mg/dL — ABNORMAL HIGH (ref 70–99)
Potassium: 4 mmol/L (ref 3.5–5.1)
Sodium: 140 mmol/L (ref 135–145)

## 2023-11-28 LAB — CBC
HCT: 42.7 % (ref 39.0–52.0)
Hemoglobin: 13.9 g/dL (ref 13.0–17.0)
MCH: 32.6 pg (ref 26.0–34.0)
MCHC: 32.6 g/dL (ref 30.0–36.0)
MCV: 100 fL (ref 80.0–100.0)
Platelets: 273 10*3/uL (ref 150–400)
RBC: 4.27 MIL/uL (ref 4.22–5.81)
RDW: 13.8 % (ref 11.5–15.5)
WBC: 12.1 10*3/uL — ABNORMAL HIGH (ref 4.0–10.5)
nRBC: 0 % (ref 0.0–0.2)

## 2023-11-28 LAB — URINALYSIS, W/ REFLEX TO CULTURE (INFECTION SUSPECTED)
Bilirubin Urine: NEGATIVE
Glucose, UA: NEGATIVE mg/dL
Ketones, ur: NEGATIVE mg/dL
Nitrite: NEGATIVE
Protein, ur: 30 mg/dL — AB
Specific Gravity, Urine: 1.025 (ref 1.005–1.030)
pH: 5 (ref 5.0–8.0)

## 2023-11-28 LAB — HEMOGLOBIN A1C
Hgb A1c MFr Bld: 6 % — ABNORMAL HIGH (ref 4.8–5.6)
Mean Plasma Glucose: 125.5 mg/dL

## 2023-11-28 LAB — GLUCOSE, CAPILLARY: Glucose-Capillary: 149 mg/dL — ABNORMAL HIGH (ref 70–99)

## 2023-11-28 LAB — SURGICAL PCR SCREEN
MRSA, PCR: NEGATIVE
Staphylococcus aureus: NEGATIVE

## 2023-11-28 NOTE — Progress Notes (Signed)
PCP - Dr. Rodney Langton Cardiologist -   PPM/ICD - denies Device Orders - na Rep Notified - na  Chest x-ray - na EKG - PAT, 11/28/2023 Stress Test -  ECHO -  Cardiac Cath -   Sleep Study - Diagnosed with sleep apnea CPAP - doesn't wear  Type II diabetic.  Blood sugar in PAT 149.  CGM Freestyle Libre 3 Fasting Blood Sugar - 96-130 Checks Blood Sugar: continuous  Last dose of GLP1 agonist-  na GLP1 instructions: na  Blood Thinner Instructions: denies Aspirin Instructions: Follow surgeon's instructions  ERAS Protcol - G2 until 0915  Anesthesia review: Yes. HTN, DM, sleep apnea, seizures  Patient denies shortness of breath, fever, cough and chest pain at PAT appointment   All instructions explained to the patient, with a verbal understanding of the material. Patient agrees to go over the instructions while at home for a better understanding. Patient also instructed to self quarantine after being tested for COVID-19. The opportunity to ask questions was provided.

## 2023-11-29 ENCOUNTER — Encounter: Payer: Medicare Other | Admitting: Family Medicine

## 2023-11-29 NOTE — Progress Notes (Signed)
Anesthesia Chart Review:  Case: [id number] Date/Time: 12/05/23 1157   Procedures:      RIGHT REVERSE SHOULDER ARTHROPLASTY (Right: Shoulder)     BICEPS TENODESIS (Right: Shoulder)   Anesthesia type: General   Pre-op diagnosis: right shoulder rotator cuff arthropathy, biceps tendinitis   Location: [facility] OR ROOM 06 / [facility] OR   Surgeons: Cammy Copa, MD       DISCUSSION: Patient is a 73 year old male scheduled for the above procedure.  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 BMIs consistent with morbid obesity.  A1c 6.0%. He wears a CGM Freestyle Libre 3. He is on metformin 1000 mg BID and Humulin R U-500 80 units Q AM and 40 units Q PM. Last endocrinology follow-up was on 11/16/23. He is on U-500, so I've reached out to Dr. Charlean Sanfilippo staff about contacting patient with preoperative insulin recommendations.   I called and spoke with Mr. Morelan. I told him I would reach out to endocrinology to give him insulin recommendations for surgery. He denied chest pain, SOB, orthopnea, palpitations. He had some vein treatments to his LLE which he said resolved his LE edema. His activity is currently limited due to end-stage left knee osteoarthritis. He had temporary improvement following left geniculate artery embolization in June. He walks with a cane or uses a scooter. Dr. Doneen Poisson had wanted his BMI down before considering him for left TKA. He has been able to intentionally lose 50 lbs in the past 6 months, primarily with dietary changes. (His BMI in March 2023 was 55 and is now at 91.)   Anesthesia team to evaluate on the day of surgery.   VS: BP (!) 142/90   Pulse 100   Temp 36.6 C   Resp 17   Ht 5\' 7"  (1.702 m)   Wt 134.7 kg   SpO2 98%    BMI 46.52 kg/m    PROVIDERS: Monica Becton, MD is PCP  Carlus Pavlov, MD is endocrinologist [place], [place], [place] is Pain Management provider ([facility])   LABS: Labs reviewed: Acceptable for surgery. (all labs ordered are listed, but only abnormal results are displayed)  Labs Reviewed  GLUCOSE, CAPILLARY - Abnormal; Notable for the following components:      Result Value   Glucose-Capillary 149 (*)    All other components within normal limits  HEMOGLOBIN A1C - Abnormal; Notable for the following components:   Hgb A1c MFr Bld 6.0 (*)    All other components within normal limits  CBC - Abnormal; Notable for the following components:   WBC 12.1 (*)    All other components within normal limits  BASIC METABOLIC PANEL - Abnormal; Notable for the following components:   Glucose, Bld 110 (*)    All other components within normal limits  URINALYSIS, W/ REFLEX TO CULTURE (INFECTION SUSPECTED) - Abnormal; Notable for the following components:   Color, Urine AMBER (*)    APPearance HAZY (*)    Hgb urine dipstick SMALL (*)    Protein, ur 30 (*)    Leukocytes,Ua TRACE (*)    Bacteria, UA RARE (*)    All other components within normal limits  SURGICAL PCR SCREEN    IMAGES: CT Right Shoulder 09/28/23: IMPRESSION: 1. Moderate glenohumeral and acromioclavicular joint osteoarthritis. 2. Extensive rotator cuff tears with advanced fatty atrophy  of the cuff musculature. 3. 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   EKG: 11/27/13: Normal sinus rhythm Left axis deviation Incomplete right bundle branch block Minimal voltage criteria for LVH, may be normal variant ( R in aVL ) Abnormal ECG - LAD is more pronounced (previously more borderline) and incomplete RBBB is new when compared to 11/26/14 EKG.   CV: N/A   Past Medical History:  Diagnosis Date   Chronic back pain 09/13/2013   Chronic pain syndrome 09/13/2013   GERD  (gastroesophageal reflux disease)    hx. of   Gout 09/13/2013   Hemorrhoids    Hypertension    Morbid obesity with body mass index of 45.0-49.9 in adult [facility]) 09/13/2013   Osteoarthritis 09/13/2013   Pneumonia    hx. of   Seizures (HCC)    Sleep apnea    has never used C-pap machine due to insurance cost   Type 1 diabetes mellitus, uncontrolled     Past Surgical History:  Procedure Laterality Date   broken toe 35 years ago     COLONOSCOPY WITH PROPOFOL N/A 12/15/2022   Procedure: COLONOSCOPY WITH PROPOFOL;  Surgeon: Wyline Mood, MD;  Location: [facility] ENDOSCOPY;  Service: Gastroenterology;  Laterality: N/A;   IR EMBO ARTERIAL NOT HEMORR HEMANG INC GUIDE ROADMAPPING  04/27/2023   IR RADIOLOGIST EVAL & MGMT  03/29/2023   IR RADIOLOGIST EVAL & MGMT  08/02/2023   TOTAL KNEE ARTHROPLASTY Right 05/01/2015   Procedure: RIGHT TOTAL KNEE ARTHROPLASTY;  Surgeon: Kathryne Hitch, MD;  Location: [facility] ORS;  Service: Orthopedics;  Laterality: Right;   WISDOM TOOTH EXTRACTION      MEDICATIONS:  aspirin 81 MG tablet   BD PEN NEEDLE NANO 2ND GEN 32G X 4 MM MISC   bisoprolol (ZEBETA) 5 MG tablet   Continuous Glucose Sensor (FREESTYLE LIBRE 3 SENSOR) MISC   diclofenac (VOLTAREN) 75 MG EC tablet   doxycycline (VIBRA-TABS) 100 MG tablet   DULoxetine (CYMBALTA) 60 MG capsule   hydrochlorothiazide (HYDRODIURIL) 12.5 MG tablet   insulin regular human CONCENTRATED (HUMULIN R U-500 KWIKPEN) 500 UNIT/ML KwikPen   metFORMIN (GLUCOPHAGE) 1000 MG tablet   montelukast (SINGULAIR) 10 MG tablet   omeprazole (PRILOSEC) 20 MG capsule   sulfamethoxazole-trimethoprim (BACTRIM DS) 800-160 MG tablet   No current facility-administered medications for this encounter.  Hydrochlorothiazide 12.5 mg as needed for edema, bisoprolol (discontinued course), doxycycline, Singulair, Bactrim DS are listed as not taking.   Shonna Chock, PA-C Surgical Short Stay/Anesthesiology [facility] Phone [phone] [facility] Phone [phone] 11/30/2023 5:17 PM

## 2023-12-01 ENCOUNTER — Telehealth: Payer: Self-pay

## 2023-12-01 NOTE — Telephone Encounter (Signed)
Received a VM from Allison,PA at [facility]. Pt is having surgery on 12/05/23 for R shoulder. We need to provide pt with insulin instructions fo follow for the evening before and the morning of surgery.   Please Advise,

## 2023-12-01 NOTE — Telephone Encounter (Signed)
J  Per my records, He is on the following regimen: 80 units before 1st meal of the day  30-35 units before dinner  I will suggest to decrease the evening dose the night before to only 10 units before dinner.  Skip the insulin the morning of the surgery and only take half of the dose with his first solid meal after the surgery.  Afterwards, he can go back to the previous recommendations. Ty! C

## 2023-12-04 ENCOUNTER — Telehealth: Payer: Self-pay | Admitting: *Deleted

## 2023-12-04 NOTE — Anesthesia Preprocedure Evaluation (Signed)
Anesthesia Evaluation    Reviewed: Allergy & Precautions, H&P , Patient's Chart, lab work & pertinent test results, Unable to perform ROS - Chart review only  Airway        Dental   Pulmonary former smoker          Cardiovascular hypertension, + CAD  + dysrhythmias      Neuro/Psych    GI/Hepatic   Endo/Other  diabetes, Type 2, Oral Hypoglycemic Agents    Renal/GU      Musculoskeletal  (+) Arthritis ,    Abdominal   Peds  Hematology   Anesthesia Other Findings   Reproductive/Obstetrics                             Anesthesia Physical Anesthesia Plan  ASA: III  Anesthesia Plan: General   Post-op Pain Management:    Induction: Intravenous  PONV Risk Score and Plan:   Airway Management Planned: Oral ETT  Additional Equipment:   Intra-op Plan:   Post-operative Plan: Extubation in OR  Informed Consent: I have reviewed the patients History and Physical, chart, labs and discussed the procedure including the risks, benefits and alternatives for the proposed anesthesia with the patient or authorized representative who has indicated his/her understanding and acceptance.       Plan Discussed with:   Anesthesia Plan Comments: (Pt presented to short stay in ?sinus tach in low 140's  vs afib with RVR. QRS complex on EKG today is wider than that on EKG 03/02/2025 as well. Will have sent to ED for evaluation.   PAT note written by Shonna Chock, PA-C.  )        Anesthesia Quick Evaluation

## 2023-12-04 NOTE — Care Plan (Signed)
[facility] RNCM call to patient to discuss his upcoming Right reverse shoulder replacement with Dr. August Saucer at [facility] on 12/05/23. He is agreeable to case management. He lives with his spouse, who will be assisting after discharge home. He has a home bionic sling that was delivered by [employer] prior to surgery. No other DME needed. Anticipate no HH will be needed. OPPT will be set up by [facility]. Reviewed post op instructions and questions answered. Will continue to follow for needs.

## 2023-12-04 NOTE — Telephone Encounter (Signed)
Ortho bundle pre-op call completed.

## 2023-12-05 ENCOUNTER — Encounter (HOSPITAL_COMMUNITY): Payer: Self-pay | Admitting: Vascular Surgery

## 2023-12-05 ENCOUNTER — Encounter: Payer: Medicare Other | Admitting: Family Medicine

## 2023-12-05 ENCOUNTER — Ambulatory Visit (HOSPITAL_COMMUNITY): Admission: RE | Admit: 2023-12-05 | Payer: Medicare Other | Source: Home / Self Care | Admitting: Orthopedic Surgery

## 2023-12-05 ENCOUNTER — Encounter (HOSPITAL_COMMUNITY): Admission: RE | Payer: Self-pay | Source: Home / Self Care

## 2023-12-05 DIAGNOSIS — E119 Type 2 diabetes mellitus without complications: Secondary | ICD-10-CM

## 2023-12-05 DIAGNOSIS — Z01818 Encounter for other preprocedural examination: Secondary | ICD-10-CM

## 2023-12-05 DIAGNOSIS — Z794 Long term (current) use of insulin: Secondary | ICD-10-CM

## 2023-12-05 SURGERY — ARTHROPLASTY, SHOULDER, TOTAL, REVERSE
Anesthesia: General | Site: Shoulder | Laterality: Right

## 2023-12-09 IMAGING — DX DG SHOULDER 2+V*R*
3 series · 3 of 3 positions shown · non-contrast
Comparison: None.

CLINICAL DATA: Right shoulder pain.  No known injury.

EXAM:
RIGHT SHOULDER - 2+ VIEW

[shoulder grashey]
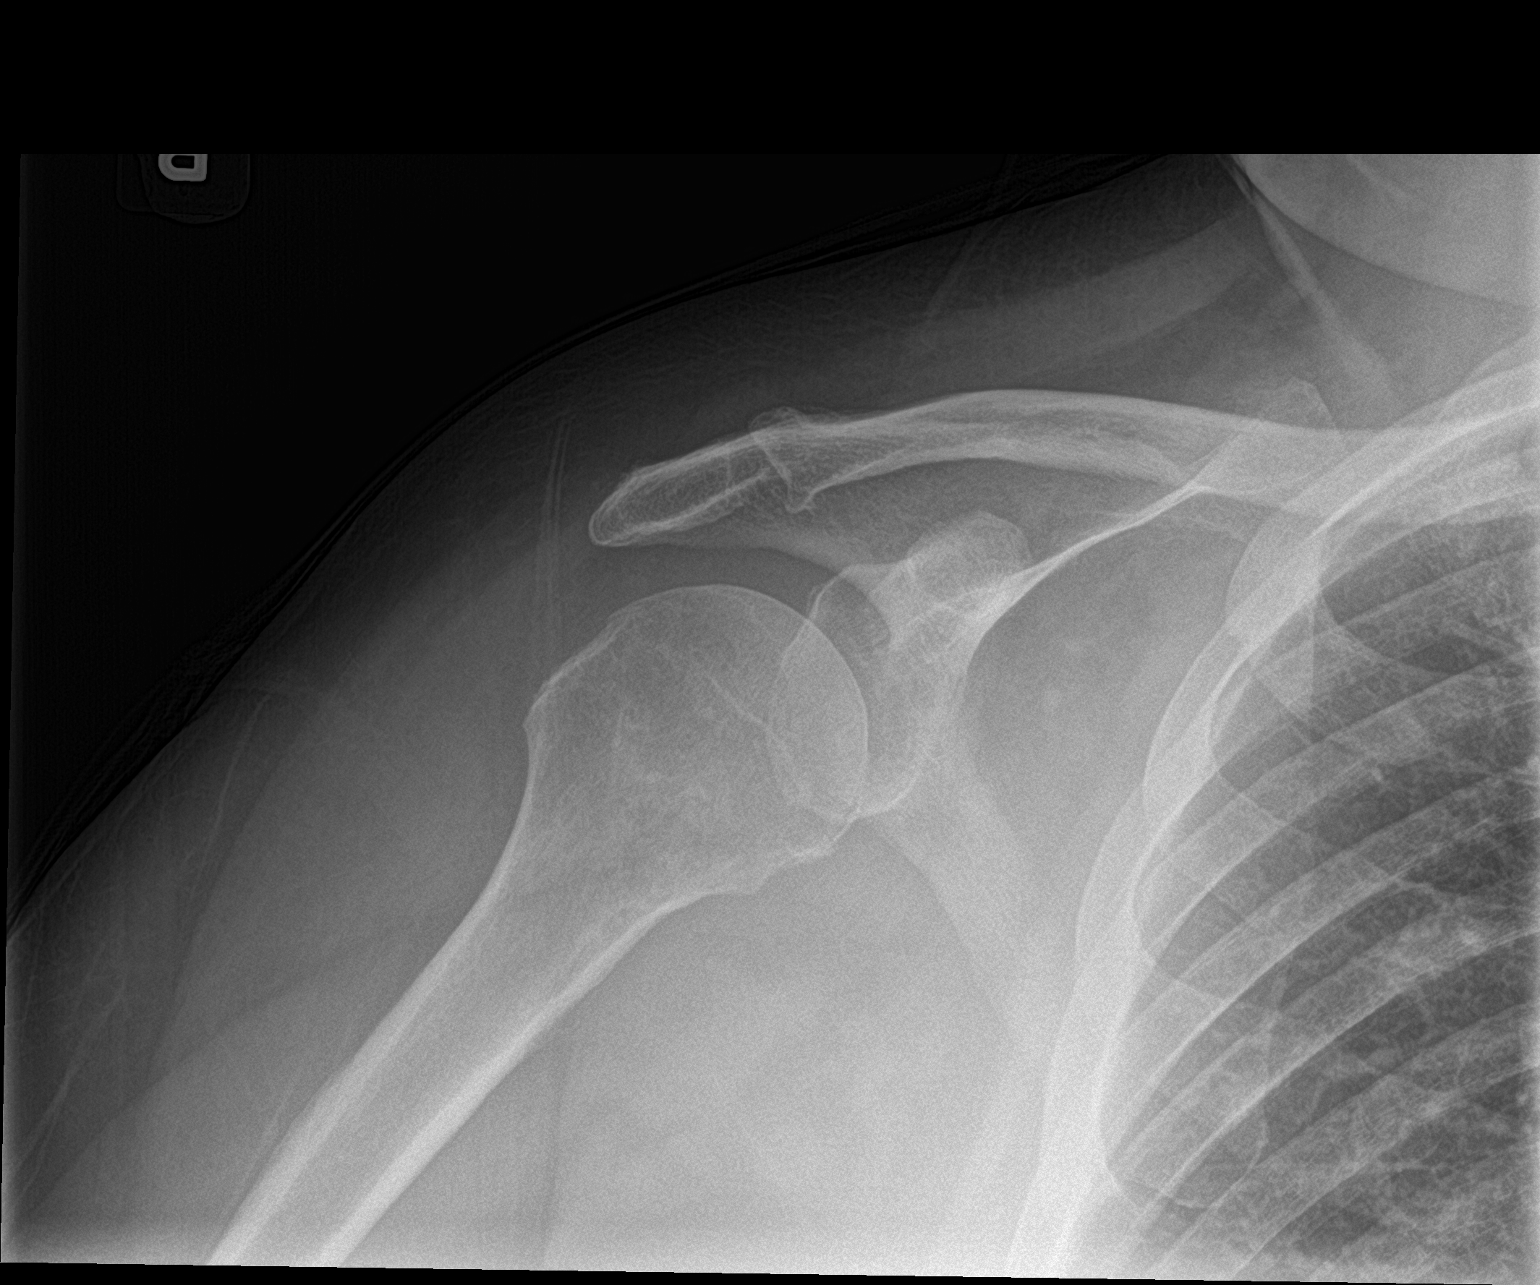

[shoulder y view]
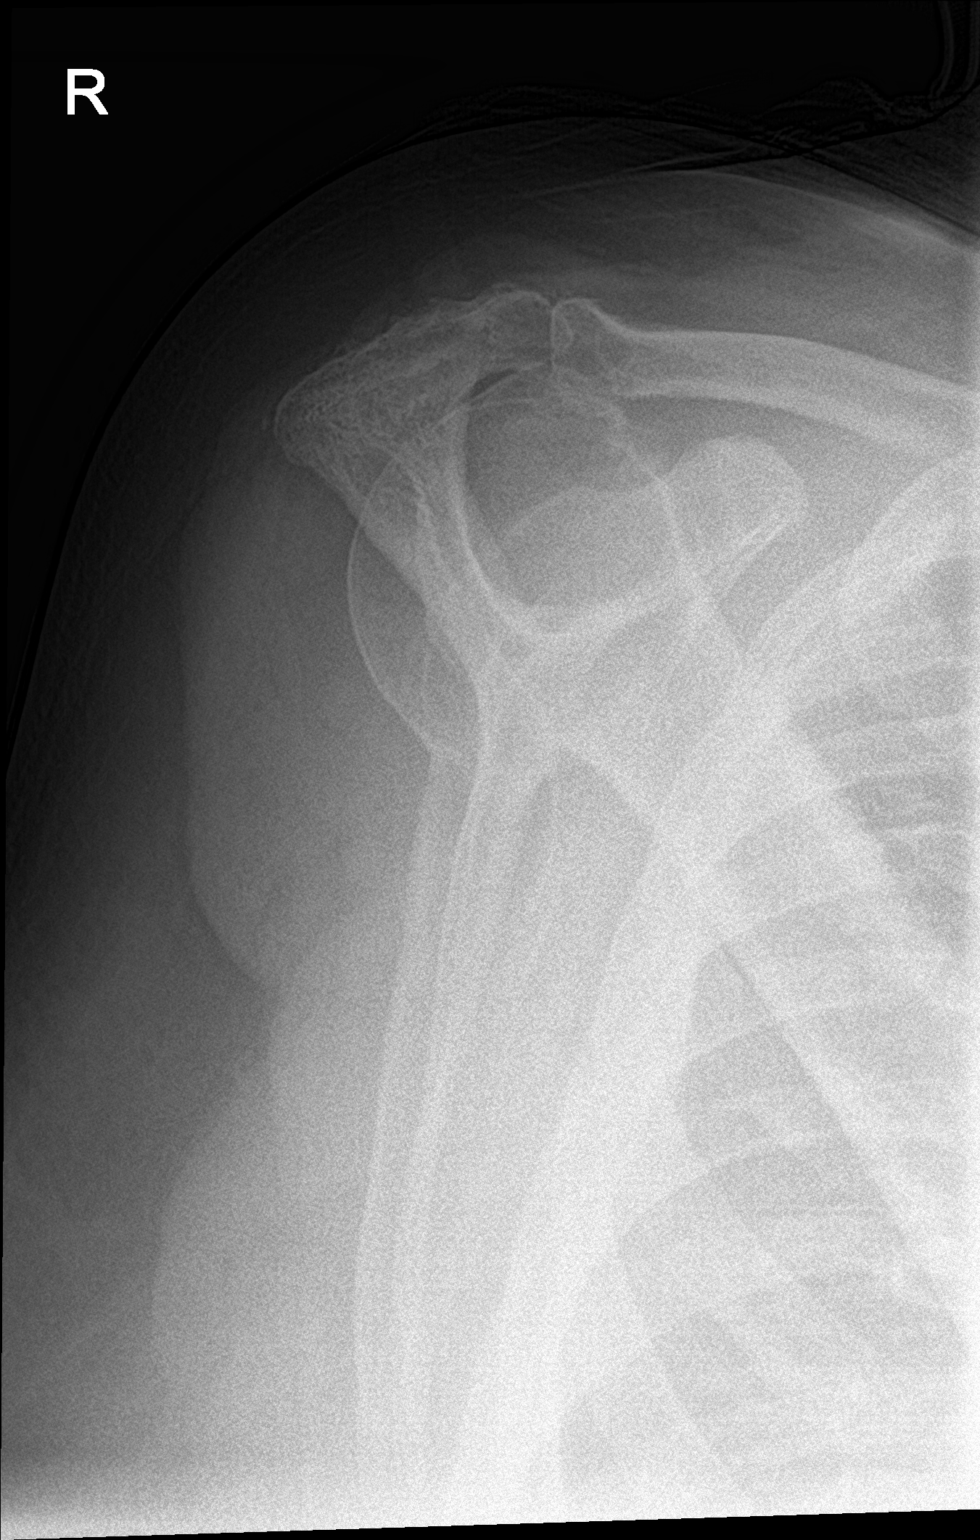

[shoulder axillary]
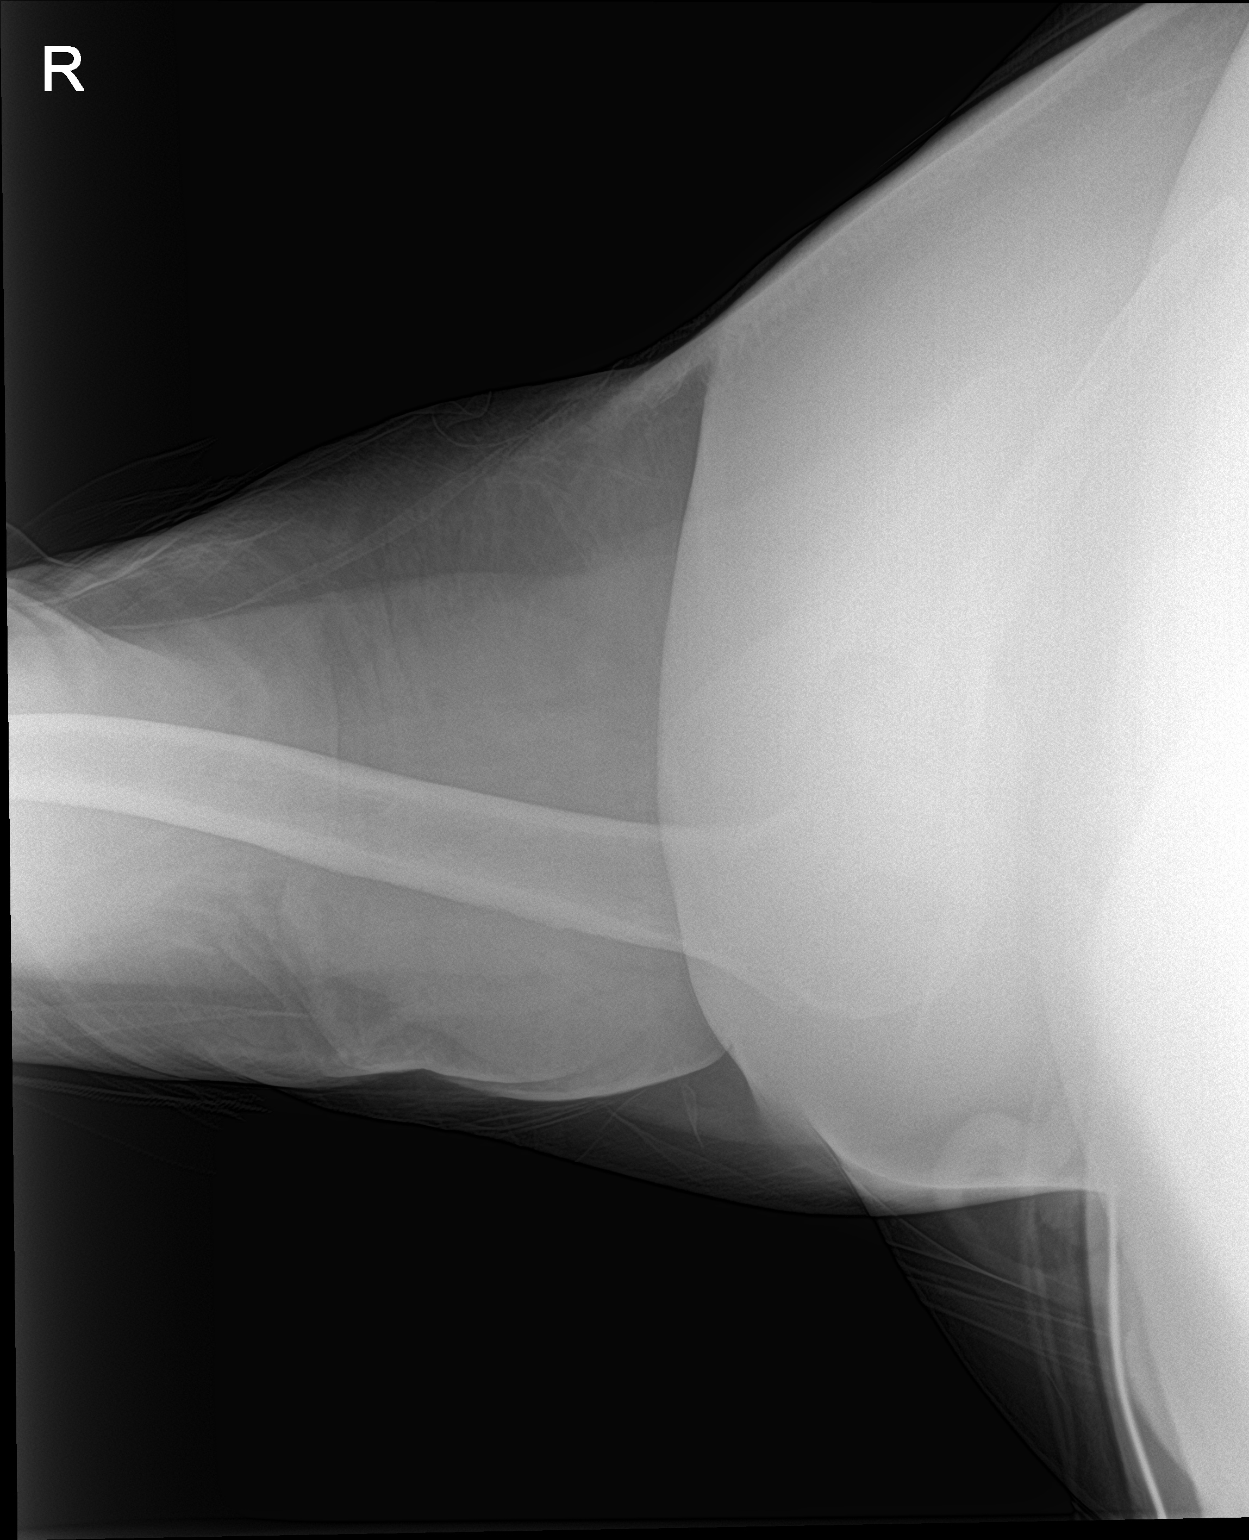

[3 of 3 positions shown; findings below may reference images not displayed]

FINDINGS: Evaluation is limited due to body habitus and soft tissue
attenuation. There is no acute fracture or dislocation. No
significant arthritic changes. Mild spurring at the right AC joint
the soft tissues are unremarkable.
IMPRESSION: No acute findings.

## 2023-12-11 ENCOUNTER — Ambulatory Visit: Payer: Medicare Other

## 2023-12-11 VITALS — Ht 66.0 in | Wt 297.0 lb

## 2023-12-11 DIAGNOSIS — Z0000 Encounter for general adult medical examination without abnormal findings: Secondary | ICD-10-CM

## 2023-12-11 NOTE — Progress Notes (Signed)
Subjective:   Christian [name] is a 73 y.o. male who presents for Medicare Annual/Subsequent preventive examination.  Visit Complete: Virtual I connected with  Christian [name] on 12/11/23 by a audio enabled telemedicine application and verified that I am speaking with the correct person using two identifiers.  Patient Location: Home  Provider Location: Office/Clinic  I discussed the limitations of evaluation and management by telemedicine. The patient expressed understanding and agreed to proceed.  Vital Signs: Because this visit was a virtual/telehealth visit, some criteria may be missing or patient reported. Any vitals not documented were not able to be obtained and vitals that have been documented are patient reported.  Patient Medicare AWV questionnaire was completed by the patient on 12/03/2023; I have confirmed that all information answered by patient is correct and no changes since this date.  Cardiac Risk Factors include: advanced age (>36men, >27 women);diabetes mellitus;male gender;obesity (BMI >30kg/m2)     Objective:    Today's Vitals   12/11/23 1409 12/11/23 1416  Weight: 297 lb (134.7 kg)   Height: 5\' 6"  (1.676 m)   PainSc:  7    Body mass index is 47.94 kg/m.     12/11/2023    2:55 PM 11/28/2023    1:11 PM 04/27/2023    8:16 AM 12/15/2022    8:45 AM 12/02/2022   10:48 AM 11/29/2021    9:48 AM 06/01/2021    2:51 PM  Advanced Directives  Does Patient Have a Medical Advance Directive? No;Yes No No No No No No  Type of Advance Directive Healthcare Power of Attorney        Does patient want to make changes to medical advance directive? No - Patient declined        Copy of Healthcare Power of Attorney in Chart? No - copy requested        Would patient like information on creating a medical advance directive? No - Patient declined No - Patient declined No - Patient declined No - Patient declined No - Patient declined No - Patient declined Yes  (MAU/Ambulatory/Procedural Areas - Information given)    Current Medications (verified) Outpatient Encounter Medications as of 12/11/2023  Medication Sig   aspirin 81 MG tablet Take 81 mg by mouth daily.   BD PEN NEEDLE NANO 2ND GEN 32G X 4 MM MISC USE AS DIRECTED 5 TIMES A DAY WITH INSULIN INJECTIONS   Continuous Glucose Sensor (FREESTYLE LIBRE 3 SENSOR) MISC by Does not apply route. Use to check glucose continuously change every 14 days   diclofenac (VOLTAREN) 75 MG EC tablet TAKE 1 TABLET (75 MG TOTAL) BY MOUTH 2 (TWO) TIMES DAILY. WITH OMEPRAZOLE   DULoxetine (CYMBALTA) 60 MG capsule Take 60 mg by mouth daily.   insulin regular human CONCENTRATED (HUMULIN R U-500 KWIKPEN) 500 UNIT/ML KwikPen INJECT 200 UNITS UNDER SKIN A DAY AS ADVISED (Patient taking differently: Inject 40-80 Units into the skin. Inject 80 units under the skin in the morning and 40 units in the evening)   metFORMIN (GLUCOPHAGE) 1000 MG tablet TAKE 1 TABLET BY MOUTH TWICE  DAILY WITH A MEAL   omeprazole (PRILOSEC) 20 MG capsule Take 1 capsule (20 mg total) by mouth daily. With diclofenac (Patient taking differently: Take 20 mg by mouth daily as needed (acid reflux).)   bisoprolol (ZEBETA) 5 MG tablet Take 1 PO qd x 1 week, then 1/2 PO qd x 1 week, then 1/2 PO qod x 1 week, then stop (Patient not taking: Reported on  11/24/2023)   hydrochlorothiazide (HYDRODIURIL) 12.5 MG tablet TAKE 1/2 TO 1 TABLET BY MOUTH EVERY DAY AS NEEDED FOR EDEMA (Patient not taking: Reported on 11/24/2023)   montelukast (SINGULAIR) 10 MG tablet TAKE 1 TABLET BY MOUTH AT BEDTIME AS NEEDED. (Patient not taking: Reported on 12/11/2023)   [DISCONTINUED] doxycycline (VIBRA-TABS) 100 MG tablet Take 1 tablet (100 mg total) by mouth 2 (two) times daily. (Patient not taking: Reported on 11/24/2023)   [DISCONTINUED] sulfamethoxazole-trimethoprim (BACTRIM DS) 800-160 MG tablet Take 1 tablet by mouth 2 (two) times daily. (Patient not taking: Reported on 11/24/2023)   No  facility-administered encounter medications on file as of 12/11/2023.    Allergies (verified) Penicillins   History: Past Medical History:  Diagnosis Date   Chronic back pain 09/13/2013   Chronic pain syndrome 09/13/2013   GERD (gastroesophageal reflux disease)    hx. of   Gout 09/13/2013   Hemorrhoids    Hypertension    Morbid obesity with body mass index of 45.0-49.9 in adult [facility]) 09/13/2013   Osteoarthritis 09/13/2013   Pneumonia    hx. 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   Procedure: RIGHT TOTAL KNEE ARTHROPLASTY;  Surgeon: Kathryne Hitch, MD;  Location: [facility] ORS;  Service: Orthopedics;  Laterality: Right;   WISDOM TOOTH EXTRACTION     Family History  Problem Relation Age of Onset   Alcohol abuse Father    Arthritis Maternal Grandmother    Alcohol abuse Paternal Grandmother    Alcohol abuse Paternal Grandfather    Diabetes Maternal Aunt    Social History   Socioeconomic History   Marital status: Married    Spouse name: Hilda Lias   Number of children: 3   Years of education: 14   Highest education level: Some college, no degree  Occupational History   Occupation: Retired  Tobacco Use   Smoking status: Former    Current packs/day: 0.00    Average packs/day: 1 pack/day for 42.2 years (42.2 ttl pk-yrs)    Types: Cigarettes    Start date: 12/12/1961    Quit date: 03/08/2004    Years since quitting: 19.7   Smokeless  tobacco: Never  Vaping Use   Vaping status: Never Used  Substance and Sexual Activity   Alcohol use: No    Comment: rare   Drug use: No   Sexual activity: Yes    Partners: Female  Other Topics Concern   Not on file  Social History Narrative   Lives with wife. He has three children. He enjoys Diplomatic Services operational officer.   Social Drivers of Corporate investment banker Strain: Low Risk  (12/11/2023)   Overall Financial Resource Strain (CARDIA)    Difficulty of Paying Living Expenses: Not hard at all  Food Insecurity: No Food Insecurity (12/11/2023)   Hunger Vital Sign    Worried About Running Out of Food in the  Last Year: Never true    Ran Out of Food in the Last Year: Never true  Transportation Needs: No Transportation Needs (12/11/2023)   PRAPARE - Administrator, Civil Service (Medical): No    Lack of Transportation (Non-Medical): No  Physical Activity: Inactive (12/11/2023)   Exercise Vital Sign    Days of Exercise per Week: 0 days    Minutes of Exercise per Session: 0 min  Stress: No Stress Concern Present (12/11/2023)   [employer] of Occupational Health - Occupational Stress Questionnaire    Feeling of Stress : Not at all  Social Connections: Moderately Isolated (12/11/2023)   Social Connection and Isolation Panel [NHANES]    Frequency of Communication with Friends and Family: Twice a week    Frequency of Social Gatherings with Friends and Family: Three times a week    Attends Religious Services: Never    Active Member of Clubs or Organizations: No    Attends Engineer, structural: Never    Marital Status: Married    Tobacco Counseling Counseling given: Not Answered   Clinical Intake:  Pre-visit preparation completed: Yes  Pain : 0-10 Pain Score: 7  Pain Type: Chronic pain Pain Location: Leg Pain Orientation: Left, Right Pain Descriptors / Indicators: Aching Pain Onset: More than a month ago Pain Frequency: Constant Pain Relieving  Factors: Tylenol and Cymbalta Effect of Pain on Daily Activities: Has trouble walking due to the pain. No more than 50 feet at a time.  Pain Relieving Factors: Tylenol and Cymbalta  BMI - recorded: 47.94 Nutritional Status: BMI > 30  Obese Nutritional Risks: None Diabetes: Yes CBG done?: No (Not in office/ 141 mgdl last night) Did pt. bring in CBG monitor from home?: No  What is the last grade level you completed in school?: 13  Interpreter Needed?: No      Activities of Daily Living    12/11/2023    2:34 PM 11/28/2023    1:15 PM  In your present state of health, do you have any difficulty performing the following activities:  Hearing? 1   Comment He reports some hearing problems on the phone.   Vision? 0   Difficulty concentrating or making decisions? 0   Walking or climbing stairs? 1   Dressing or bathing? 1   Comment He needs surgery on his shoulder. He needs help putting on a shirt.   Doing errands, shopping? 0 0  Preparing Food and eating ? N   Using the Toilet? N   In the past six months, have you accidently leaked urine? Y   Comment Trouble getting out of the chair.   Do you have problems with loss of bowel control? N   Managing your Medications? N   Managing your Finances? N   Housekeeping or managing your Housekeeping? N     Patient Care Team: Monica Becton, MD as PCP - General (Sports Medicine) August Saucer, Corrie Mckusick, MD as Consulting Physician (Orthopedic Surgery) Carlus Pavlov, MD as Consulting Physician (Internal Medicine) Cannon Kettle, MD as Referring Physician (Pain Medicine) Wyline Mood, MD as Consulting Physician (Gastroenterology) Kathryne Hitch, MD as Consulting Physician (Orthopedic Surgery)  Indicate any recent Medical Services you may have received from other than [facility] providers in the past year (date may be approximate).     Assessment:   This is a routine wellness examination for Brodee.  Hearing/Vision  screen Hearing Screening - Comments:: Unable to test Vision Screening - Comments:: Unable to test.  Goals Addressed             This Visit's Progress    Weight (lb) < 270 lb (122.5 kg)   297 lb (134.7 kg)    He would like to lose about 20 more pounds.       Depression Screen    12/11/2023    2:51 PM 12/02/2022   10:51 AM 11/29/2021    9:41 AM 06/01/2021    2:51 PM 10/25/2019   10:46 AM 08/31/2017    2:04 PM 12/21/2015   10:28 AM  PHQ 2/9 Scores  PHQ - 2 Score 0 0 0 0 0 0 0  PHQ- 9 Score    0  0     Fall Risk    12/11/2023    2:57 PM 12/02/2022   10:49 AM 11/29/2021    9:40 AM 06/01/2021    3:29 PM 10/27/2019    7:10 AM  Fall Risk   Falls in the past year? 1 0 1 1 0  Number falls in past yr: 1 0 1 1   Injury with Fall? 0 0 0 0   Risk for fall due to : Orthopedic patient History of fall(s) History of fall(s) Impaired balance/gait   Follow up Falls evaluation completed Falls prevention discussed;Education provided;Falls evaluation completed Falls evaluation completed;Education provided;Falls prevention discussed Education provided     MEDICARE RISK AT HOME: Medicare Risk at Home Any stairs in or around the home?: Yes If so, are there any without handrails?: Yes Home free of loose throw rugs in walkways, pet beds, electrical cords, etc?: Yes Adequate lighting in your home to reduce risk of falls?: Yes Life alert?: No Use of a cane, walker or w/c?: Yes Grab bars in the bathroom?: No Shower chair or bench in shower?: Yes Elevated toilet seat or a handicapped toilet?: Yes  TIMED UP AND GO:  Was the test performed?  No    Cognitive Function:    08/31/2017    2:03 PM  MMSE - Mini Mental State Exam  Orientation to time 5  Orientation to Place 5  Registration 3  Attention/ Calculation 0  Recall 3  Language- name 2 objects 0  Language- repeat 1  Language- follow 3 step command 3  Language- read & follow direction 0  Write a sentence 0  Copy design 0  Total  score 20        12/11/2023    3:05 PM 12/02/2022   11:07 AM 11/29/2021    9:57 AM  6CIT Screen  What Year? 0 points 0 points 0 points  What month? 0 points 0 points 0 points  What time? 0 points 0 points 0 points  Count back from 20 0 points 0 points 0 points  Months in reverse 0 points 0 points 0 points  Repeat phrase 2 points 4 points 0 points  Total Score 2 points 4 points 0 points    Immunizations Immunization History  Administered Date(s) Administered   Moderna Sars-Covid-2 Vaccination 12/03/2019, 12/31/2019, 10/28/2020   PPD Test 05/06/2015    TDAP status: Due, Education has been provided regarding the importance of this vaccine. Advised may receive this vaccine at local pharmacy or Health Dept. Aware to provide a copy of the vaccination record if obtained from local pharmacy or Health Dept. Verbalized acceptance and understanding.  Flu Vaccine status: Declined, Education has been provided regarding the importance of this vaccine but patient still declined. Advised may receive this vaccine at local pharmacy or  Health Dept. Aware to provide a copy of the vaccination record if obtained from local pharmacy or Health Dept. Verbalized acceptance and understanding.  Pneumococcal vaccine status: Due, Education has been provided regarding the importance of this vaccine. Advised may receive this vaccine at local pharmacy or Health Dept. Aware to provide a copy of the vaccination record if obtained from local pharmacy or Health Dept. Verbalized acceptance and understanding.  Covid-19 vaccine status: Declined, Education has been provided regarding the importance of this vaccine but patient still declined. Advised may receive this vaccine at local pharmacy or Health Dept.or vaccine clinic. Aware to provide a copy of the vaccination record if obtained from local pharmacy or Health Dept. Verbalized acceptance and understanding.  Qualifies for Shingles Vaccine? Yes   Zostavax completed No    Shingrix Completed?: No.    Education has been provided regarding the importance of this vaccine. Patient has been advised to call insurance company to determine out of pocket expense if they have not yet received this vaccine. Advised may also receive vaccine at local pharmacy or Health Dept. Verbalized acceptance and understanding.  Screening Tests Health Maintenance  Topic Date Due   Pneumonia Vaccine 30+ Years old (1 of 2 - PCV) Never done   DTaP/Tdap/Td (1 - Tdap) Never done   Zoster Vaccines- Shingrix (1 of 2) Never done   Diabetic kidney evaluation - Urine ACR  05/25/2023   COVID-19 Vaccine (4 - 2024-25 season) 07/09/2023   OPHTHALMOLOGY EXAM  12/08/2023   INFLUENZA VACCINE  02/05/2024 (Originally 06/08/2023)   HEMOGLOBIN A1C  05/27/2024   FOOT EXAM  07/13/2024   Diabetic kidney evaluation - eGFR measurement  11/27/2024   Medicare Annual Wellness (AWV)  12/10/2024   Colonoscopy  12/15/2032   Hepatitis C Screening  Completed   HPV VACCINES  Aged Out    Health Maintenance  Health Maintenance Due  Topic Date Due   Pneumonia Vaccine 29+ Years old (1 of 2 - PCV) Never done   DTaP/Tdap/Td (1 - Tdap) Never done   Zoster Vaccines- Shingrix (1 of 2) Never done   Diabetic kidney evaluation - Urine ACR  05/25/2023   COVID-19 Vaccine (4 - 2024-25 season) 07/09/2023   OPHTHALMOLOGY EXAM  12/08/2023    Colorectal cancer screening: Type of screening: Colonoscopy. Completed 12/15/2022. Repeat every 10 years  Lung Cancer Screening: (Low Dose CT Chest recommended if Age 12-80 years, 20 pack-year currently smoking OR have quit w/in 15years.) does not qualify.   Lung Cancer Screening Referral: n/a  Additional Screening:  Hepatitis C Screening: does qualify; Completed 08/31/2017  Vision Screening: Recommended annual ophthalmology exams for early detection of glaucoma and other disorders of the eye. Is the patient up to date with their annual eye exam?  No. He will  schedule soon. Who is  the provider or what is the name of the office in which the patient attends annual eye exams?[facility].  If pt is not established with a provider, would they like to be referred to a provider to establish care?  N/a .   Dental Screening: Recommended annual dental exams for proper oral hygiene  Diabetic Foot Exam: Diabetic Foot Exam: Completed 07/14/2023  Community Resource Referral / Chronic Care Management: CRR required this visit?  No   CCM required this visit?  No     Plan:     I have personally reviewed and noted the following in the patient's chart:   Medical and social history Use of alcohol, tobacco or  illicit drugs  Current medications and supplements including opioid prescriptions. Patient is not currently taking opioid prescriptions. Functional ability and status Nutritional status Physical activity Advanced directives List of other physicians Hospitalizations, surgeries, and ER visits in previous 12 months Vitals Screenings to include cognitive, depression, and falls Referrals and appointments  In addition, I have reviewed and discussed with patient certain preventive protocols, quality metrics, and best practice recommendations. A written personalized care plan for preventive services as well as general preventive health recommendations were provided to patient.     Esmond Harps, CMA   12/11/2023   After Visit Summary: (MyChart) Due to this being a telephonic visit, the after visit summary with patients personalized plan was offered to patient via MyChart   Nurse Notes:   T-dap and Shingles with the pharmacy. Pneumococcal in the office the next visit.   Kele states he will schedule his eye exam soon.

## 2023-12-11 NOTE — Patient Instructions (Signed)
  [name] , Thank you for taking time to come for your Medicare Wellness Visit. I appreciate your ongoing commitment to your health goals. Please review the following plan we discussed and let me know if I can assist you in the future.   These are the goals we discussed:  Goals       Increase physical activity      Starting 08/31/2017, I will continue to ride my bicycle for at least 20 min daily as weather permits.       Patient Stated (pt-stated)      Patient states that he would like to loose 20 lbs.      Patient Stated (pt-stated)      He stated that he bought a bike and he would like to loose weight.      Weight (lb) < 270 lb (122.5 kg)      He would like to lose about 20 more pounds.         This is a list of the screening recommended for you and due dates:  Health Maintenance  Topic Date Due   Pneumonia Vaccine (1 of 2 - PCV) Never done   DTaP/Tdap/Td vaccine (1 - Tdap) Never done   Zoster (Shingles) Vaccine (1 of 2) Never done   Yearly kidney health urinalysis for diabetes  05/25/2023   COVID-19 Vaccine (4 - 2024-25 season) 07/09/2023   Eye exam for diabetics  12/08/2023   Flu Shot  02/05/2024*   Hemoglobin A1C  05/27/2024   Complete foot exam   07/13/2024   Yearly kidney function blood test for diabetes  11/27/2024   Medicare Annual Wellness Visit  12/10/2024   Colon Cancer Screening  12/15/2032   Hepatitis C Screening  Completed   HPV Vaccine  Aged Out  *Topic was postponed. The date shown is not the original due date.

## 2023-12-13 ENCOUNTER — Other Ambulatory Visit (INDEPENDENT_AMBULATORY_CARE_PROVIDER_SITE_OTHER): Payer: Self-pay

## 2023-12-13 ENCOUNTER — Ambulatory Visit (INDEPENDENT_AMBULATORY_CARE_PROVIDER_SITE_OTHER): Payer: Medicare Other | Admitting: Orthopaedic Surgery

## 2023-12-13 ENCOUNTER — Encounter: Payer: Self-pay | Admitting: Orthopaedic Surgery

## 2023-12-13 ENCOUNTER — Other Ambulatory Visit: Payer: Self-pay

## 2023-12-13 ENCOUNTER — Other Ambulatory Visit (INDEPENDENT_AMBULATORY_CARE_PROVIDER_SITE_OTHER): Payer: Medicare Other

## 2023-12-13 DIAGNOSIS — G8929 Other chronic pain: Secondary | ICD-10-CM

## 2023-12-13 DIAGNOSIS — M5441 Lumbago with sciatica, right side: Secondary | ICD-10-CM

## 2023-12-13 DIAGNOSIS — M5442 Lumbago with sciatica, left side: Secondary | ICD-10-CM

## 2023-12-13 DIAGNOSIS — M25561 Pain in right knee: Secondary | ICD-10-CM

## 2023-12-13 NOTE — Progress Notes (Signed)
 The patient is someone is well-known to our clinic.  He is 73 years old and we have replaced his right knee back in 2016.  He was scheduled to have a right shoulder replacement by my partner Dr. Addie but that was canceled given the fact the patient cannot mobilize well.  He is someone who is morbidly obese.  His BMI is almost 48.  He reports pain down both his legs but also his right knee and lumbar spine pain.  Examination of his right knee shows no effusion.  He has pretty good range of motion of that knee and it does not feel unstable.  His right hip also moves smoothly.  He has known significant arthritis of his left knee with varus malalignment of the left knee.  He has limited mobility and general given his deconditioning and morbid obesity.  He has limited mobility of his lumbar spine.  He is a diabetic but his last hemoglobin A1c just 2 weeks ago was 6.0.  Previous x-rays of his left knee show severe end-stage arthritis with bone-on-bone wear of all 3 compartments and significant loss of joint space in general.  There is varus malalignment of that knee.  X-rays today of his right knee show no complicating features of the knee replacement.  X-rays of the lumbar spine however shows severe malalignment of the lumbar spine.  There is complete loss of lumbar lordosis.  There is severe degenerative disc disease and deformity of the vertebral bodies at several levels especially between L3 and L5.  There are osteophytes throughout the lumbar spine.  We need to send him for MRI of his lumbar spine to assess his degree of stenosis and deformity in order to come up with a treatment plan.  His wife has had significant lumbar spine surgery and revision of that surgery and is seen the spine [facility].  He may end up needing to make a referral to them due to the severity of his lumbar spine issues.  We will see him back once we have MRI of his lumbar spine or at least make a referral  once we see those results.

## 2023-12-14 ENCOUNTER — Telehealth: Payer: Self-pay | Admitting: Orthopedic Surgery

## 2023-12-14 NOTE — Telephone Encounter (Signed)
 Called patient to discuss rescheduling right reverse shoulder arthroplasty.  Patient stated he is having an MRI because he has an issue with his back that may need to be resolved before the shoulder.  Patient contact me when ready to have his shoulder surgery.

## 2023-12-15 ENCOUNTER — Telehealth: Payer: Self-pay

## 2023-12-15 NOTE — Telephone Encounter (Signed)
 Order sent through [facility]   submitted to [facility]

## 2023-12-20 ENCOUNTER — Encounter: Payer: Medicare Other | Admitting: Surgical

## 2023-12-25 ENCOUNTER — Ambulatory Visit
Admission: RE | Admit: 2023-12-25 | Discharge: 2023-12-25 | Disposition: A | Discharge status: Home / Self Care | Payer: Medicare Other | Source: Ambulatory Visit | Attending: Orthopaedic Surgery | Admitting: Orthopaedic Surgery

## 2023-12-25 DIAGNOSIS — M5126 Other intervertebral disc displacement, lumbar region: Secondary | ICD-10-CM | POA: Diagnosis not present

## 2023-12-25 DIAGNOSIS — M48061 Spinal stenosis, lumbar region without neurogenic claudication: Secondary | ICD-10-CM | POA: Diagnosis not present

## 2023-12-25 DIAGNOSIS — M4316 Spondylolisthesis, lumbar region: Secondary | ICD-10-CM | POA: Diagnosis not present

## 2023-12-25 DIAGNOSIS — G8929 Other chronic pain: Secondary | ICD-10-CM

## 2024-01-03 ENCOUNTER — Ambulatory Visit (INDEPENDENT_AMBULATORY_CARE_PROVIDER_SITE_OTHER): Payer: Medicare Other | Admitting: Orthopaedic Surgery

## 2024-01-03 ENCOUNTER — Encounter: Payer: Self-pay | Admitting: Orthopaedic Surgery

## 2024-01-03 DIAGNOSIS — G8929 Other chronic pain: Secondary | ICD-10-CM

## 2024-01-03 DIAGNOSIS — M5442 Lumbago with sciatica, left side: Secondary | ICD-10-CM | POA: Diagnosis not present

## 2024-01-03 DIAGNOSIS — M5441 Lumbago with sciatica, right side: Secondary | ICD-10-CM

## 2024-01-03 NOTE — Progress Notes (Signed)
 The patient is here for follow-up as it relates to obtaining a MRI of his lumbar spine.  He has been having severe debilitating low back pain and it does radiate down his right leg and his left leg.  He is in need of a shoulder replacement by my partner Dr. August Saucer but the back became more of an issue.  His wife is seen spine specialist with [facility] and at this point is planning to get him an appointment with the spine specialist with [facility].  The MRI of his lumbar spine does show severe foraminal impingement at to the right side at L3-L4 and L4-L5 but also at L4-L5 to the left side there is a paracentral extrusion that is migrating upward.  I believe both of these are symptomatic for him.  He does have advanced degenerative changes also at L5-S1 and there is degenerative scoliosis.  Given these MRI findings I agree with him seeing a spine specialist at this point to consider options.  He will follow-up later with my partner Dr. August Saucer for his shoulder in terms of considering a shoulder replacement at some point.

## 2024-01-11 DIAGNOSIS — M47816 Spondylosis without myelopathy or radiculopathy, lumbar region: Secondary | ICD-10-CM | POA: Diagnosis not present

## 2024-01-11 DIAGNOSIS — M5416 Radiculopathy, lumbar region: Secondary | ICD-10-CM | POA: Diagnosis not present

## 2024-01-30 DIAGNOSIS — M5416 Radiculopathy, lumbar region: Secondary | ICD-10-CM | POA: Diagnosis not present

## 2024-02-15 DIAGNOSIS — M47816 Spondylosis without myelopathy or radiculopathy, lumbar region: Secondary | ICD-10-CM | POA: Diagnosis not present

## 2024-02-19 DIAGNOSIS — M4316 Spondylolisthesis, lumbar region: Secondary | ICD-10-CM | POA: Diagnosis not present

## 2024-02-21 DIAGNOSIS — R54 Age-related physical debility: Secondary | ICD-10-CM | POA: Diagnosis not present

## 2024-02-21 DIAGNOSIS — G4733 Obstructive sleep apnea (adult) (pediatric): Secondary | ICD-10-CM | POA: Diagnosis not present

## 2024-02-21 DIAGNOSIS — D72829 Elevated white blood cell count, unspecified: Secondary | ICD-10-CM | POA: Diagnosis not present

## 2024-02-21 DIAGNOSIS — E669 Obesity, unspecified: Secondary | ICD-10-CM | POA: Diagnosis not present

## 2024-02-21 DIAGNOSIS — I10 Essential (primary) hypertension: Secondary | ICD-10-CM | POA: Diagnosis not present

## 2024-02-21 DIAGNOSIS — E785 Hyperlipidemia, unspecified: Secondary | ICD-10-CM | POA: Diagnosis not present

## 2024-02-21 DIAGNOSIS — R000 Tachycardia, unspecified: Secondary | ICD-10-CM | POA: Diagnosis not present

## 2024-02-21 DIAGNOSIS — G8929 Other chronic pain: Secondary | ICD-10-CM | POA: Diagnosis not present

## 2024-02-21 DIAGNOSIS — R569 Unspecified convulsions: Secondary | ICD-10-CM | POA: Diagnosis not present

## 2024-02-21 DIAGNOSIS — R0609 Other forms of dyspnea: Secondary | ICD-10-CM | POA: Diagnosis not present

## 2024-02-21 DIAGNOSIS — Z01818 Encounter for other preprocedural examination: Secondary | ICD-10-CM | POA: Diagnosis not present

## 2024-02-21 DIAGNOSIS — E1169 Type 2 diabetes mellitus with other specified complication: Secondary | ICD-10-CM | POA: Diagnosis not present

## 2024-02-22 ENCOUNTER — Other Ambulatory Visit: Payer: Self-pay | Admitting: Interventional Radiology

## 2024-02-22 DIAGNOSIS — M25562 Pain in left knee: Secondary | ICD-10-CM

## 2024-02-23 DIAGNOSIS — R0609 Other forms of dyspnea: Secondary | ICD-10-CM | POA: Diagnosis not present

## 2024-02-26 ENCOUNTER — Encounter: Payer: Self-pay | Admitting: Sports Medicine

## 2024-02-26 ENCOUNTER — Ambulatory Visit (INDEPENDENT_AMBULATORY_CARE_PROVIDER_SITE_OTHER): Admitting: Sports Medicine

## 2024-02-26 VITALS — BP 110/73 | HR 113 | Resp 20 | Ht 66.0 in | Wt 289.0 lb

## 2024-02-26 DIAGNOSIS — R000 Tachycardia, unspecified: Secondary | ICD-10-CM | POA: Insufficient documentation

## 2024-02-26 DIAGNOSIS — Z01818 Encounter for other preprocedural examination: Secondary | ICD-10-CM | POA: Insufficient documentation

## 2024-02-26 DIAGNOSIS — Z0000 Encounter for general adult medical examination without abnormal findings: Secondary | ICD-10-CM

## 2024-02-26 NOTE — Assessment & Plan Note (Signed)
 No longer taking his beta-blocker due to interactions with his insulin . Mildly tachycardic but asymptomatic, we will work this up in further detail at his follow-up.

## 2024-02-26 NOTE — Progress Notes (Signed)
    Procedures performed today:    None.  Independent interpretation of notes and tests performed by another provider:   We personally reviewed a twelve-lead ECG from an outside source, regular rate, normal sinus rhythm, he does have a left axis deviation and signs of ventricular hypertrophy.  Echo showed severe concentric hypertrophy.  Brief History, Exam, Impression, and Recommendations:    Preop examination Jalon is scheduled for a transforaminal lumbar interbody fusion. He has greater than 4 metabolic equivalents of cardiac capacity. No bleeding diatheses. He is not on any blood thinners with exception of aspirin which will be stopped a week before the surgery. He is cleared for intermediate risk noncardiac surgery such as a lumbar fusion.  Annual physical exam I would like Halston to come back in a couple weeks for a fasting annual physical, we can get him caught up on his preventive measures at that juncture.  Tachycardia No longer taking his beta-blocker due to interactions with his insulin . Mildly tachycardic but asymptomatic, we will work this up in further detail at his follow-up.    ____________________________________________ Joselyn Nicely. Sandy Crumb, M.D., ABFM., CAQSM., AME. Primary Care and Sports Medicine [facility]  Adjunct Professor of [facility] Medicine  [employer]  Restaurant manager, fast food

## 2024-02-26 NOTE — Assessment & Plan Note (Signed)
 I would like Christian [name] to come back in a couple weeks for a fasting annual physical, we can get him caught up on his preventive measures at that juncture.

## 2024-02-26 NOTE — Assessment & Plan Note (Signed)
 Christian [name] is scheduled for a transforaminal lumbar interbody fusion. He has greater than 4 metabolic equivalents of cardiac capacity. No bleeding diatheses. He is not on any blood thinners with exception of aspirin which will be stopped a week before the surgery. He is cleared for intermediate risk noncardiac surgery such as a lumbar fusion.

## 2024-03-04 DIAGNOSIS — Z6841 Body Mass Index (BMI) 40.0 and over, adult: Secondary | ICD-10-CM | POA: Diagnosis not present

## 2024-03-04 DIAGNOSIS — M51369 Other intervertebral disc degeneration, lumbar region without mention of lumbar back pain or lower extremity pain: Secondary | ICD-10-CM | POA: Diagnosis present

## 2024-03-04 DIAGNOSIS — M51379 Other intervertebral disc degeneration, lumbosacral region without mention of lumbar back pain or lower extremity pain: Secondary | ICD-10-CM | POA: Diagnosis present

## 2024-03-04 DIAGNOSIS — Z8261 Family history of arthritis: Secondary | ICD-10-CM | POA: Diagnosis not present

## 2024-03-04 DIAGNOSIS — I10 Essential (primary) hypertension: Secondary | ICD-10-CM | POA: Diagnosis not present

## 2024-03-04 DIAGNOSIS — Z48811 Encounter for surgical aftercare following surgery on the nervous system: Secondary | ICD-10-CM | POA: Diagnosis present

## 2024-03-04 DIAGNOSIS — M48062 Spinal stenosis, lumbar region with neurogenic claudication: Secondary | ICD-10-CM | POA: Diagnosis not present

## 2024-03-04 DIAGNOSIS — G4733 Obstructive sleep apnea (adult) (pediatric): Secondary | ICD-10-CM | POA: Diagnosis not present

## 2024-03-04 DIAGNOSIS — Z888 Allergy status to other drugs, medicaments and biological substances status: Secondary | ICD-10-CM | POA: Diagnosis not present

## 2024-03-04 DIAGNOSIS — I5020 Unspecified systolic (congestive) heart failure: Secondary | ICD-10-CM | POA: Diagnosis present

## 2024-03-04 DIAGNOSIS — M47816 Spondylosis without myelopathy or radiculopathy, lumbar region: Secondary | ICD-10-CM | POA: Diagnosis not present

## 2024-03-04 DIAGNOSIS — E785 Hyperlipidemia, unspecified: Secondary | ICD-10-CM | POA: Diagnosis present

## 2024-03-04 DIAGNOSIS — Z833 Family history of diabetes mellitus: Secondary | ICD-10-CM | POA: Diagnosis not present

## 2024-03-04 DIAGNOSIS — E119 Type 2 diabetes mellitus without complications: Secondary | ICD-10-CM | POA: Diagnosis not present

## 2024-03-04 DIAGNOSIS — G894 Chronic pain syndrome: Secondary | ICD-10-CM | POA: Diagnosis present

## 2024-03-04 DIAGNOSIS — Z981 Arthrodesis status: Secondary | ICD-10-CM | POA: Diagnosis not present

## 2024-03-04 DIAGNOSIS — Z880 Allergy status to penicillin: Secondary | ICD-10-CM | POA: Diagnosis not present

## 2024-03-04 DIAGNOSIS — Z7984 Long term (current) use of oral hypoglycemic drugs: Secondary | ICD-10-CM | POA: Diagnosis not present

## 2024-03-04 DIAGNOSIS — R339 Retention of urine, unspecified: Secondary | ICD-10-CM | POA: Diagnosis not present

## 2024-03-04 DIAGNOSIS — I110 Hypertensive heart disease with heart failure: Secondary | ICD-10-CM | POA: Diagnosis present

## 2024-03-04 DIAGNOSIS — M25561 Pain in right knee: Secondary | ICD-10-CM | POA: Diagnosis present

## 2024-03-04 DIAGNOSIS — M25562 Pain in left knee: Secondary | ICD-10-CM | POA: Diagnosis present

## 2024-03-04 DIAGNOSIS — M545 Low back pain, unspecified: Secondary | ICD-10-CM | POA: Diagnosis not present

## 2024-03-04 DIAGNOSIS — M5416 Radiculopathy, lumbar region: Secondary | ICD-10-CM | POA: Diagnosis not present

## 2024-03-04 DIAGNOSIS — E1140 Type 2 diabetes mellitus with diabetic neuropathy, unspecified: Secondary | ICD-10-CM | POA: Diagnosis present

## 2024-03-04 DIAGNOSIS — Z794 Long term (current) use of insulin: Secondary | ICD-10-CM | POA: Diagnosis not present

## 2024-03-04 DIAGNOSIS — Z811 Family history of alcohol abuse and dependence: Secondary | ICD-10-CM | POA: Diagnosis not present

## 2024-03-04 DIAGNOSIS — R739 Hyperglycemia, unspecified: Secondary | ICD-10-CM | POA: Diagnosis not present

## 2024-03-04 DIAGNOSIS — K219 Gastro-esophageal reflux disease without esophagitis: Secondary | ICD-10-CM | POA: Diagnosis not present

## 2024-03-04 DIAGNOSIS — M5136 Other intervertebral disc degeneration, lumbar region with discogenic back pain only: Secondary | ICD-10-CM | POA: Diagnosis not present

## 2024-03-04 DIAGNOSIS — K5900 Constipation, unspecified: Secondary | ICD-10-CM | POA: Diagnosis not present

## 2024-03-04 DIAGNOSIS — D72829 Elevated white blood cell count, unspecified: Secondary | ICD-10-CM | POA: Diagnosis present

## 2024-03-04 DIAGNOSIS — Z96651 Presence of right artificial knee joint: Secondary | ICD-10-CM | POA: Diagnosis present

## 2024-03-04 DIAGNOSIS — M532X6 Spinal instabilities, lumbar region: Secondary | ICD-10-CM | POA: Diagnosis present

## 2024-03-04 DIAGNOSIS — R000 Tachycardia, unspecified: Secondary | ICD-10-CM | POA: Diagnosis not present

## 2024-03-04 DIAGNOSIS — M4316 Spondylolisthesis, lumbar region: Secondary | ICD-10-CM | POA: Diagnosis present

## 2024-03-04 DIAGNOSIS — Z79899 Other long term (current) drug therapy: Secondary | ICD-10-CM | POA: Diagnosis not present

## 2024-03-04 DIAGNOSIS — E1142 Type 2 diabetes mellitus with diabetic polyneuropathy: Secondary | ICD-10-CM | POA: Diagnosis not present

## 2024-03-04 DIAGNOSIS — Z7982 Long term (current) use of aspirin: Secondary | ICD-10-CM | POA: Diagnosis not present

## 2024-03-04 DIAGNOSIS — D62 Acute posthemorrhagic anemia: Secondary | ICD-10-CM | POA: Diagnosis present

## 2024-03-04 DIAGNOSIS — Z87891 Personal history of nicotine dependence: Secondary | ICD-10-CM | POA: Diagnosis not present

## 2024-03-05 DIAGNOSIS — E119 Type 2 diabetes mellitus without complications: Secondary | ICD-10-CM | POA: Diagnosis not present

## 2024-03-06 DIAGNOSIS — I5020 Unspecified systolic (congestive) heart failure: Secondary | ICD-10-CM | POA: Diagnosis present

## 2024-03-06 DIAGNOSIS — M51369 Other intervertebral disc degeneration, lumbar region without mention of lumbar back pain or lower extremity pain: Secondary | ICD-10-CM | POA: Diagnosis present

## 2024-03-06 DIAGNOSIS — D72829 Elevated white blood cell count, unspecified: Secondary | ICD-10-CM | POA: Diagnosis not present

## 2024-03-06 DIAGNOSIS — M25561 Pain in right knee: Secondary | ICD-10-CM | POA: Diagnosis present

## 2024-03-06 DIAGNOSIS — E1142 Type 2 diabetes mellitus with diabetic polyneuropathy: Secondary | ICD-10-CM | POA: Diagnosis not present

## 2024-03-06 DIAGNOSIS — Z79899 Other long term (current) drug therapy: Secondary | ICD-10-CM | POA: Diagnosis not present

## 2024-03-06 DIAGNOSIS — Z811 Family history of alcohol abuse and dependence: Secondary | ICD-10-CM | POA: Diagnosis not present

## 2024-03-06 DIAGNOSIS — M4316 Spondylolisthesis, lumbar region: Secondary | ICD-10-CM | POA: Diagnosis present

## 2024-03-06 DIAGNOSIS — M48062 Spinal stenosis, lumbar region with neurogenic claudication: Secondary | ICD-10-CM | POA: Diagnosis present

## 2024-03-06 DIAGNOSIS — Z6841 Body Mass Index (BMI) 40.0 and over, adult: Secondary | ICD-10-CM | POA: Diagnosis not present

## 2024-03-06 DIAGNOSIS — Z7984 Long term (current) use of oral hypoglycemic drugs: Secondary | ICD-10-CM | POA: Diagnosis not present

## 2024-03-06 DIAGNOSIS — R339 Retention of urine, unspecified: Secondary | ICD-10-CM | POA: Diagnosis not present

## 2024-03-06 DIAGNOSIS — K5900 Constipation, unspecified: Secondary | ICD-10-CM | POA: Diagnosis not present

## 2024-03-06 DIAGNOSIS — I10 Essential (primary) hypertension: Secondary | ICD-10-CM | POA: Diagnosis present

## 2024-03-06 DIAGNOSIS — E119 Type 2 diabetes mellitus without complications: Secondary | ICD-10-CM | POA: Diagnosis not present

## 2024-03-06 DIAGNOSIS — M25562 Pain in left knee: Secondary | ICD-10-CM | POA: Diagnosis present

## 2024-03-06 DIAGNOSIS — Z87891 Personal history of nicotine dependence: Secondary | ICD-10-CM | POA: Diagnosis not present

## 2024-03-06 DIAGNOSIS — Z96651 Presence of right artificial knee joint: Secondary | ICD-10-CM | POA: Diagnosis present

## 2024-03-06 DIAGNOSIS — Z8261 Family history of arthritis: Secondary | ICD-10-CM | POA: Diagnosis not present

## 2024-03-06 DIAGNOSIS — Z794 Long term (current) use of insulin: Secondary | ICD-10-CM | POA: Diagnosis not present

## 2024-03-06 DIAGNOSIS — E785 Hyperlipidemia, unspecified: Secondary | ICD-10-CM | POA: Diagnosis present

## 2024-03-06 DIAGNOSIS — D62 Acute posthemorrhagic anemia: Secondary | ICD-10-CM | POA: Diagnosis not present

## 2024-03-06 DIAGNOSIS — G894 Chronic pain syndrome: Secondary | ICD-10-CM | POA: Diagnosis present

## 2024-03-06 DIAGNOSIS — Z833 Family history of diabetes mellitus: Secondary | ICD-10-CM | POA: Diagnosis not present

## 2024-03-06 DIAGNOSIS — R739 Hyperglycemia, unspecified: Secondary | ICD-10-CM | POA: Diagnosis not present

## 2024-03-06 DIAGNOSIS — Z48811 Encounter for surgical aftercare following surgery on the nervous system: Secondary | ICD-10-CM | POA: Diagnosis present

## 2024-03-06 DIAGNOSIS — Z888 Allergy status to other drugs, medicaments and biological substances status: Secondary | ICD-10-CM | POA: Diagnosis not present

## 2024-03-06 DIAGNOSIS — Z880 Allergy status to penicillin: Secondary | ICD-10-CM | POA: Diagnosis not present

## 2024-03-06 DIAGNOSIS — M532X6 Spinal instabilities, lumbar region: Secondary | ICD-10-CM | POA: Diagnosis present

## 2024-03-06 DIAGNOSIS — M51379 Other intervertebral disc degeneration, lumbosacral region without mention of lumbar back pain or lower extremity pain: Secondary | ICD-10-CM | POA: Diagnosis present

## 2024-03-06 DIAGNOSIS — E1140 Type 2 diabetes mellitus with diabetic neuropathy, unspecified: Secondary | ICD-10-CM | POA: Diagnosis present

## 2024-03-06 DIAGNOSIS — I110 Hypertensive heart disease with heart failure: Secondary | ICD-10-CM | POA: Diagnosis present

## 2024-03-06 DIAGNOSIS — Z7982 Long term (current) use of aspirin: Secondary | ICD-10-CM | POA: Diagnosis not present

## 2024-03-06 DIAGNOSIS — R000 Tachycardia, unspecified: Secondary | ICD-10-CM | POA: Diagnosis not present

## 2024-03-06 DIAGNOSIS — M5416 Radiculopathy, lumbar region: Secondary | ICD-10-CM | POA: Diagnosis present

## 2024-03-06 DIAGNOSIS — G4733 Obstructive sleep apnea (adult) (pediatric): Secondary | ICD-10-CM | POA: Diagnosis present

## 2024-03-07 ENCOUNTER — Encounter (HOSPITAL_COMMUNITY): Payer: Self-pay | Admitting: Physical Medicine and Rehabilitation

## 2024-03-07 ENCOUNTER — Inpatient Hospital Stay (HOSPITAL_COMMUNITY)
Admission: AD | Admit: 2024-03-07 | Discharge: 2024-03-19 | DRG: 949 | Disposition: A | Discharge status: Home / Self Care | Source: Other Acute Inpatient Hospital | Attending: Physical Medicine and Rehabilitation | Admitting: Physical Medicine and Rehabilitation

## 2024-03-07 ENCOUNTER — Other Ambulatory Visit: Payer: Self-pay

## 2024-03-07 ENCOUNTER — Other Ambulatory Visit: Payer: Self-pay | Admitting: Physical Medicine and Rehabilitation

## 2024-03-07 ENCOUNTER — Encounter: Payer: Self-pay | Admitting: Physical Medicine and Rehabilitation

## 2024-03-07 DIAGNOSIS — Z87898 Personal history of other specified conditions: Secondary | ICD-10-CM

## 2024-03-07 DIAGNOSIS — Z79899 Other long term (current) drug therapy: Secondary | ICD-10-CM

## 2024-03-07 DIAGNOSIS — M25562 Pain in left knee: Secondary | ICD-10-CM | POA: Diagnosis present

## 2024-03-07 DIAGNOSIS — R339 Retention of urine, unspecified: Secondary | ICD-10-CM | POA: Diagnosis not present

## 2024-03-07 DIAGNOSIS — I10 Essential (primary) hypertension: Secondary | ICD-10-CM | POA: Diagnosis present

## 2024-03-07 DIAGNOSIS — Z880 Allergy status to penicillin: Secondary | ICD-10-CM | POA: Diagnosis not present

## 2024-03-07 DIAGNOSIS — Z7401 Bed confinement status: Secondary | ICD-10-CM

## 2024-03-07 DIAGNOSIS — Z48811 Encounter for surgical aftercare following surgery on the nervous system: Principal | ICD-10-CM

## 2024-03-07 DIAGNOSIS — Z888 Allergy status to other drugs, medicaments and biological substances status: Secondary | ICD-10-CM

## 2024-03-07 DIAGNOSIS — Z8261 Family history of arthritis: Secondary | ICD-10-CM

## 2024-03-07 DIAGNOSIS — Z833 Family history of diabetes mellitus: Secondary | ICD-10-CM | POA: Diagnosis not present

## 2024-03-07 DIAGNOSIS — D72829 Elevated white blood cell count, unspecified: Secondary | ICD-10-CM | POA: Insufficient documentation

## 2024-03-07 DIAGNOSIS — D62 Acute posthemorrhagic anemia: Secondary | ICD-10-CM | POA: Insufficient documentation

## 2024-03-07 DIAGNOSIS — Z7984 Long term (current) use of oral hypoglycemic drugs: Secondary | ICD-10-CM | POA: Diagnosis not present

## 2024-03-07 DIAGNOSIS — K5900 Constipation, unspecified: Secondary | ICD-10-CM | POA: Diagnosis not present

## 2024-03-07 DIAGNOSIS — G4733 Obstructive sleep apnea (adult) (pediatric): Secondary | ICD-10-CM | POA: Diagnosis present

## 2024-03-07 DIAGNOSIS — M170 Bilateral primary osteoarthritis of knee: Secondary | ICD-10-CM | POA: Diagnosis present

## 2024-03-07 DIAGNOSIS — Z811 Family history of alcohol abuse and dependence: Secondary | ICD-10-CM | POA: Diagnosis not present

## 2024-03-07 DIAGNOSIS — Z7409 Other reduced mobility: Secondary | ICD-10-CM | POA: Diagnosis present

## 2024-03-07 DIAGNOSIS — Z794 Long term (current) use of insulin: Secondary | ICD-10-CM

## 2024-03-07 DIAGNOSIS — G8929 Other chronic pain: Secondary | ICD-10-CM | POA: Diagnosis present

## 2024-03-07 DIAGNOSIS — Z96651 Presence of right artificial knee joint: Secondary | ICD-10-CM

## 2024-03-07 DIAGNOSIS — Z87891 Personal history of nicotine dependence: Secondary | ICD-10-CM

## 2024-03-07 DIAGNOSIS — M5416 Radiculopathy, lumbar region: Secondary | ICD-10-CM | POA: Diagnosis present

## 2024-03-07 DIAGNOSIS — M48062 Spinal stenosis, lumbar region with neurogenic claudication: Secondary | ICD-10-CM

## 2024-03-07 DIAGNOSIS — R531 Weakness: Secondary | ICD-10-CM | POA: Diagnosis present

## 2024-03-07 DIAGNOSIS — R000 Tachycardia, unspecified: Secondary | ICD-10-CM | POA: Diagnosis not present

## 2024-03-07 DIAGNOSIS — M25512 Pain in left shoulder: Secondary | ICD-10-CM | POA: Diagnosis present

## 2024-03-07 DIAGNOSIS — Z6841 Body Mass Index (BMI) 40.0 and over, adult: Secondary | ICD-10-CM | POA: Diagnosis not present

## 2024-03-07 DIAGNOSIS — M1712 Unilateral primary osteoarthritis, left knee: Secondary | ICD-10-CM | POA: Diagnosis present

## 2024-03-07 DIAGNOSIS — R739 Hyperglycemia, unspecified: Secondary | ICD-10-CM | POA: Diagnosis not present

## 2024-03-07 DIAGNOSIS — G894 Chronic pain syndrome: Secondary | ICD-10-CM | POA: Diagnosis present

## 2024-03-07 DIAGNOSIS — M7541 Impingement syndrome of right shoulder: Secondary | ICD-10-CM | POA: Diagnosis present

## 2024-03-07 DIAGNOSIS — E1142 Type 2 diabetes mellitus with diabetic polyneuropathy: Secondary | ICD-10-CM | POA: Diagnosis not present

## 2024-03-07 DIAGNOSIS — Z7982 Long term (current) use of aspirin: Secondary | ICD-10-CM

## 2024-03-07 DIAGNOSIS — E1140 Type 2 diabetes mellitus with diabetic neuropathy, unspecified: Secondary | ICD-10-CM | POA: Diagnosis present

## 2024-03-07 DIAGNOSIS — M25561 Pain in right knee: Secondary | ICD-10-CM | POA: Diagnosis present

## 2024-03-07 DIAGNOSIS — E119 Type 2 diabetes mellitus without complications: Principal | ICD-10-CM

## 2024-03-07 DIAGNOSIS — Z713 Dietary counseling and surveillance: Secondary | ICD-10-CM

## 2024-03-07 DIAGNOSIS — G63 Polyneuropathy in diseases classified elsewhere: Secondary | ICD-10-CM | POA: Diagnosis present

## 2024-03-07 LAB — GLUCOSE, CAPILLARY
Glucose-Capillary: 150 mg/dL — ABNORMAL HIGH (ref 70–99)
Glucose-Capillary: 201 mg/dL — ABNORMAL HIGH (ref 70–99)
Glucose-Capillary: 231 mg/dL — ABNORMAL HIGH (ref 70–99)

## 2024-03-07 MED ORDER — SIMETHICONE 80 MG PO CHEW: 80.0000 mg | CHEWABLE_TABLET | Freq: Four times a day (QID) | ORAL | Status: DC | PRN

## 2024-03-07 MED ORDER — HYDROCODONE-ACETAMINOPHEN 10-325 MG PO TABS: 1.0000 | ORAL_TABLET | ORAL | Status: DC | PRN

## 2024-03-07 MED ORDER — NALOXONE HCL 0.4 MG/ML IJ SOLN: 0.4000 mg | INTRAMUSCULAR | Status: DC | PRN

## 2024-03-07 MED ORDER — TAMSULOSIN HCL 0.4 MG PO CAPS
0.4000 mg | ORAL_CAPSULE | Freq: Every day | ORAL | Status: DC
  Administered 2024-03-08 – 2024-03-18 (×11): 0.4 mg via ORAL
  Filled 2024-03-07 (×11): qty 1

## 2024-03-07 MED ORDER — PROCHLORPERAZINE 25 MG RE SUPP: 12.5000 mg | Freq: Four times a day (QID) | RECTAL | Status: DC | PRN

## 2024-03-07 MED ORDER — POLYETHYLENE GLYCOL 3350 17 G PO PACK
17.0000 g | PACK | Freq: Two times a day (BID) | ORAL | Status: DC
  Administered 2024-03-07 – 2024-03-18 (×8): 17 g via ORAL
  Filled 2024-03-07 (×18): qty 1

## 2024-03-07 MED ORDER — ADULT MULTIVITAMIN W/MINERALS CH
1.0000 | ORAL_TABLET | Freq: Every day | ORAL | Status: DC
  Administered 2024-03-08 – 2024-03-18 (×11): 1 via ORAL
  Filled 2024-03-07 (×9): qty 1

## 2024-03-07 MED ORDER — DIPHENHYDRAMINE HCL 25 MG PO CAPS: 25.0000 mg | ORAL_CAPSULE | Freq: Four times a day (QID) | ORAL | Status: DC | PRN

## 2024-03-07 MED ORDER — BENEPROTEIN PO POWD
1.0000 | Freq: Three times a day (TID) | ORAL | Status: DC
  Administered 2024-03-07 – 2024-03-12 (×8): 6 g via ORAL
  Filled 2024-03-07: qty 227

## 2024-03-07 MED ORDER — FLEET ENEMA RE ENEM: 1.0000 | ENEMA | Freq: Every day | RECTAL | Status: DC | PRN

## 2024-03-07 MED ORDER — DULOXETINE HCL 30 MG PO CPEP
60.0000 mg | ORAL_CAPSULE | Freq: Every day | ORAL | Status: DC
  Administered 2024-03-08 – 2024-03-19 (×12): 60 mg via ORAL
  Filled 2024-03-07 (×12): qty 2

## 2024-03-07 MED ORDER — INSULIN NPH (HUMAN) (ISOPHANE) 100 UNIT/ML ~~LOC~~ SUSP: 10.0000 [IU] | Freq: Two times a day (BID) | SUBCUTANEOUS | Status: DC

## 2024-03-07 MED ORDER — MELATONIN 5 MG PO TABS: 5.0000 mg | ORAL_TABLET | Freq: Every evening | ORAL | Status: DC | PRN

## 2024-03-07 MED ORDER — ACETAMINOPHEN 325 MG PO TABS
650.0000 mg | ORAL_TABLET | Freq: Three times a day (TID) | ORAL | Status: DC
  Administered 2024-03-07 – 2024-03-19 (×44): 650 mg via ORAL
  Filled 2024-03-07 (×45): qty 2

## 2024-03-07 MED ORDER — PROSOURCE PLUS PO LIQD
30.0000 mL | Freq: Two times a day (BID) | ORAL | Status: DC
  Administered 2024-03-08 – 2024-03-18 (×14): 30 mL via ORAL
  Filled 2024-03-07 (×16): qty 30

## 2024-03-07 MED ORDER — BISACODYL 10 MG RE SUPP: 10.0000 mg | Freq: Every day | RECTAL | Status: DC | PRN

## 2024-03-07 MED ORDER — TRAMADOL HCL 50 MG PO TABS: 50.0000 mg | ORAL_TABLET | Freq: Four times a day (QID) | ORAL | Status: DC | PRN

## 2024-03-07 MED ORDER — METOPROLOL SUCCINATE ER 25 MG PO TB24
25.0000 mg | ORAL_TABLET | Freq: Every day | ORAL | Status: DC
  Administered 2024-03-08 – 2024-03-19 (×11): 25 mg via ORAL
  Filled 2024-03-07 (×12): qty 1

## 2024-03-07 MED ORDER — METHOCARBAMOL 500 MG PO TABS
500.0000 mg | ORAL_TABLET | Freq: Four times a day (QID) | ORAL | Status: DC | PRN
  Administered 2024-03-12 – 2024-03-17 (×5): 500 mg via ORAL
  Filled 2024-03-07 (×5): qty 1

## 2024-03-07 MED ORDER — POLYETHYLENE GLYCOL 3350 17 G PO PACK: 17.0000 g | PACK | Freq: Every day | ORAL | Status: DC

## 2024-03-07 MED ORDER — INSULIN ASPART 100 UNIT/ML IJ SOLN
0.0000 [IU] | Freq: Every day | INTRAMUSCULAR | Status: DC
  Administered 2024-03-09 – 2024-03-18 (×5): 2 [IU] via SUBCUTANEOUS

## 2024-03-07 MED ORDER — PROCHLORPERAZINE MALEATE 5 MG PO TABS: 5.0000 mg | ORAL_TABLET | Freq: Four times a day (QID) | ORAL | Status: DC | PRN

## 2024-03-07 MED ORDER — MENTHOL 3 MG MT LOZG
1.0000 | LOZENGE | OROMUCOSAL | Status: DC | PRN
  Administered 2024-03-08: 3 mg via ORAL
  Filled 2024-03-07: qty 9

## 2024-03-07 MED ORDER — GUAIFENESIN-DM 100-10 MG/5ML PO SYRP: 5.0000 mL | ORAL_SOLUTION | Freq: Four times a day (QID) | ORAL | Status: DC | PRN

## 2024-03-07 MED ORDER — OXYCODONE HCL 5 MG PO TABS
5.0000 mg | ORAL_TABLET | ORAL | Status: DC | PRN
  Administered 2024-03-07 – 2024-03-12 (×8): 10 mg via ORAL
  Administered 2024-03-12 (×2): 5 mg via ORAL
  Administered 2024-03-13 – 2024-03-19 (×10): 10 mg via ORAL
  Filled 2024-03-07 (×5): qty 2
  Filled 2024-03-07: qty 1
  Filled 2024-03-07 (×15): qty 2

## 2024-03-07 MED ORDER — INSULIN NPH (HUMAN) (ISOPHANE) 100 UNIT/ML ~~LOC~~ SUSP
10.0000 [IU] | Freq: Two times a day (BID) | SUBCUTANEOUS | Status: AC
  Administered 2024-03-07: 10 [IU] via SUBCUTANEOUS
  Filled 2024-03-07: qty 10

## 2024-03-07 MED ORDER — LIDOCAINE HCL URETHRAL/MUCOSAL 2 % EX GEL: CUTANEOUS | Status: DC | PRN

## 2024-03-07 MED ORDER — PROCHLORPERAZINE EDISYLATE 10 MG/2ML IJ SOLN: 5.0000 mg | Freq: Four times a day (QID) | INTRAMUSCULAR | Status: DC | PRN

## 2024-03-07 MED ORDER — ACETAMINOPHEN 325 MG PO TABS: 325.0000 mg | ORAL_TABLET | ORAL | Status: DC | PRN

## 2024-03-07 MED ORDER — SENNOSIDES-DOCUSATE SODIUM 8.6-50 MG PO TABS
2.0000 | ORAL_TABLET | Freq: Every day | ORAL | Status: DC
  Administered 2024-03-08: 2 via ORAL
  Filled 2024-03-07 (×4): qty 2

## 2024-03-07 MED ORDER — ASPIRIN 81 MG PO TBEC
81.0000 mg | DELAYED_RELEASE_TABLET | Freq: Every day | ORAL | Status: DC
  Administered 2024-03-08 – 2024-03-19 (×12): 81 mg via ORAL
  Filled 2024-03-07 (×12): qty 1

## 2024-03-07 MED ORDER — INSULIN ASPART 100 UNIT/ML IJ SOLN
6.0000 [IU] | Freq: Three times a day (TID) | INTRAMUSCULAR | Status: DC
  Administered 2024-03-07 – 2024-03-08 (×4): 6 [IU] via SUBCUTANEOUS

## 2024-03-07 MED ORDER — PANTOPRAZOLE SODIUM 20 MG PO TBEC
20.0000 mg | DELAYED_RELEASE_TABLET | Freq: Every day | ORAL | Status: DC
  Administered 2024-03-08 – 2024-03-19 (×11): 20 mg via ORAL
  Filled 2024-03-07 (×13): qty 1

## 2024-03-07 MED ORDER — METFORMIN HCL ER 500 MG PO TB24
2000.0000 mg | ORAL_TABLET | Freq: Every day | ORAL | Status: DC
  Administered 2024-03-08 – 2024-03-19 (×12): 2000 mg via ORAL
  Filled 2024-03-07 (×12): qty 4

## 2024-03-07 MED ORDER — CHLORHEXIDINE GLUCONATE CLOTH 2 % EX PADS
6.0000 | MEDICATED_PAD | Freq: Two times a day (BID) | CUTANEOUS | Status: DC
  Administered 2024-03-08 – 2024-03-10 (×3): 6 via TOPICAL

## 2024-03-07 MED ORDER — INSULIN ASPART 100 UNIT/ML IJ SOLN
0.0000 [IU] | Freq: Three times a day (TID) | INTRAMUSCULAR | Status: DC
  Administered 2024-03-07: 3 [IU] via SUBCUTANEOUS
  Administered 2024-03-08 – 2024-03-09 (×4): 2 [IU] via SUBCUTANEOUS
  Administered 2024-03-09: 3 [IU] via SUBCUTANEOUS
  Administered 2024-03-09: 2 [IU] via SUBCUTANEOUS
  Administered 2024-03-10 (×2): 3 [IU] via SUBCUTANEOUS
  Administered 2024-03-10: 5 [IU] via SUBCUTANEOUS
  Administered 2024-03-11: 2 [IU] via SUBCUTANEOUS
  Administered 2024-03-11 (×2): 5 [IU] via SUBCUTANEOUS
  Administered 2024-03-12 – 2024-03-13 (×4): 2 [IU] via SUBCUTANEOUS
  Administered 2024-03-13: 3 [IU] via SUBCUTANEOUS
  Administered 2024-03-13: 5 [IU] via SUBCUTANEOUS
  Administered 2024-03-14: 2 [IU] via SUBCUTANEOUS
  Administered 2024-03-14: 1 [IU] via SUBCUTANEOUS
  Administered 2024-03-14: 3 [IU] via SUBCUTANEOUS
  Administered 2024-03-15 – 2024-03-17 (×8): 2 [IU] via SUBCUTANEOUS
  Administered 2024-03-18 (×2): 1 [IU] via SUBCUTANEOUS
  Administered 2024-03-18 – 2024-03-19 (×2): 2 [IU] via SUBCUTANEOUS

## 2024-03-07 MED ORDER — ALUM & MAG HYDROXIDE-SIMETH 200-200-20 MG/5ML PO SUSP: 30.0000 mL | ORAL | Status: DC | PRN

## 2024-03-07 MED ORDER — INSULIN NPH (HUMAN) (ISOPHANE) 100 UNIT/ML ~~LOC~~ SUSP
10.0000 [IU] | Freq: Two times a day (BID) | SUBCUTANEOUS | Status: DC
  Filled 2024-03-07: qty 10

## 2024-03-07 MED ORDER — TRAMADOL HCL 50 MG PO TABS
50.0000 mg | ORAL_TABLET | Freq: Four times a day (QID) | ORAL | Status: DC | PRN
  Administered 2024-03-11 – 2024-03-15 (×2): 50 mg via ORAL
  Filled 2024-03-07 (×2): qty 1

## 2024-03-07 MED ORDER — MUPIROCIN 2 % EX OINT
TOPICAL_OINTMENT | Freq: Two times a day (BID) | CUTANEOUS | Status: AC
Start: 2024-03-07 — End: 2024-03-09
  Filled 2024-03-07: qty 22

## 2024-03-07 NOTE — Progress Notes (Signed)
 Inpatient Rehabilitation Admission Medication Review by a Pharmacist  A complete drug regimen review was completed for this patient to identify any potential clinically significant medication issues.  High Risk Drug Classes Is patient taking? Indication by Medication  Antipsychotic Yes, as an intravenous medication Compazine - N/V  Anticoagulant No   Antibiotic No   Opioid Yes Norco, Tramadol - acute pain  Antiplatelet Yes Aspirin - cva ppx  Hypoglycemics/insulin  Yes Insulin - T2DM  Vasoactive Medication Yes Toprol - HTN Flomax - BPH  Chemotherapy No   Other Yes Cymbalta - neuropathic pain Protonix - GERD Benadryl - itching Melatonin- sleep Robaxin - muscle spasms     Type of Medication Issue Identified Description of Issue Recommendation(s)  Drug Interaction(s) (clinically significant)     Duplicate Therapy     Allergy     No Medication Administration End Date     Incorrect Dose     Additional Drug Therapy Needed     Significant med changes from prior encounter (inform family/care partners about these prior to discharge).    Other       Clinically significant medication issues were identified that warrant physician communication and completion of prescribed/recommended actions by midnight of the next day:  No   Time spent performing this drug regimen review (minutes):  30   Margueritte Guthridge BS, PharmD, BCPS Clinical Pharmacist 03/07/2024 12:41 PM  Contact: [phone] after 3 PM  "Be curious, not judgmental..." -Rumalda Counter

## 2024-03-07 NOTE — Progress Notes (Signed)
 Met with patient to discuss current situation, team conference and plan of care. Reviewed use of LSO brace when OOB, incision care, and diabetic management. Continue to follow along to provide educational needs to facilitate preparation for discharge.

## 2024-03-07 NOTE — H&P (Signed)
 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 Faylene Hoots at [facility] on 03/04/24. Post op reported to have improvement in radiculopathy but continues to be limited by pain and weakness. Hemovac removed on 04/29 and to wear LSO when out of bed. Post op with ABLA and rise in WBC felt to be reactive in nature. He was   He was independent prior to admission but has been limited to bed due to pain for 6 weeks PTA. He was able to furniture walk or use cane for short distances and needed assistance to get in tub/tub seat. Uses electric scooter when out of home.    Pt also has foley- was placed during surgery, and it has not been removed- nor a voiding trail attempted- we will remove in AM.   Review of Systems  Constitutional:  Negative for chills and fever.  HENT:  Positive for hearing loss.   Eyes:  Negative for blurred vision.  Respiratory:  Negative for cough and shortness of breath.   Cardiovascular:  Negative for chest pain and leg swelling.  Gastrointestinal:  Positive for constipation (no bm since surgery). Negative for heartburn.  Genitourinary:  Negative for dysuria.  Musculoskeletal:  Positive for back pain, falls (day prior to surgery), joint pain and myalgias.  Neurological:  Positive for sensory change, focal weakness and weakness.  All other systems reviewed and are negative.   Past Medical History:  Diagnosis Date   Arthritis of left knee    Chronic back pain 09/13/2013   Chronic pain syndrome  09/13/2013   Diabetic ulcer of right ankle (HCC)    GERD (gastroesophageal reflux disease)    hx. of   Gout 09/13/2013   Hemorrhoids    Hypertension    Morbid obesity with body mass index of 45.0-49.9 in adult [facility]) 09/13/2013   Osteoarthritis 09/13/2013   Pneumonia    hx. of   Seizures (HCC)    Sleep apnea    has never used C-pap machine due to insurance cost   Type 1 diabetes mellitus, uncontrolled     Past Surgical History:  Procedure Laterality Date   broken toe 35 years ago     COLONOSCOPY WITH PROPOFOL  N/A 12/15/2022   Procedure: COLONOSCOPY WITH PROPOFOL ;  Surgeon: Luke Salaam, MD;  Location: [facility] ENDOSCOPY;  Service: Gastroenterology;  Laterality: N/A;   IR EMBO ARTERIAL NOT HEMORR HEMANG INC GUIDE ROADMAPPING  04/27/2023   IR RADIOLOGIST EVAL & MGMT  03/29/2023   IR RADIOLOGIST EVAL & MGMT  08/02/2023   TOTAL KNEE ARTHROPLASTY Right 05/01/2015   Procedure: RIGHT TOTAL KNEE ARTHROPLASTY;  Surgeon: Arnie Lao, MD;  Location: [facility] ORS;  Service: Orthopedics;  Laterality: Right;   WISDOM TOOTH EXTRACTION      Family History  Problem Relation Age of Onset   Alcohol abuse Father    Arthritis Maternal Grandmother    Alcohol abuse Paternal Grandmother    Alcohol abuse Paternal Grandfather    Diabetes Maternal Aunt     Social History:   Married--lives in [place]. Wife does not drive  and Son works in [employer]. reports that he quit smoking about 20 years ago. His smoking use included cigarettes. He started smoking about 62 years ago. He has a 42.2 pack-year smoking history. He has never used smokeless tobacco. He reports that he does not drink alcohol and does not use drugs.     Allergies  Allergen Reactions   Jardiance  [Empagliflozin ]     Yeast infections   Penicillins Rash    Medications Prior to Admission  Medication Sig Dispense Refill   aspirin  81 MG tablet Take 81 mg by mouth daily.     BD PEN NEEDLE NANO 2ND GEN 32G X 4 MM MISC USE AS DIRECTED 5 TIMES A DAY  WITH INSULIN  INJECTIONS (Patient not taking: Reported on 02/26/2024) 200 each 5   Continuous Glucose Sensor (FREESTYLE LIBRE 3 SENSOR) MISC by Does not apply route. Use to check glucose continuously change every 14 days     diclofenac  (VOLTAREN ) 75 MG EC tablet TAKE 1 TABLET (75 MG TOTAL) BY MOUTH 2 (TWO) TIMES DAILY. WITH OMEPRAZOLE  180 tablet 3   DULoxetine  (CYMBALTA ) 60 MG capsule Take 60 mg by mouth daily.     insulin  regular human CONCENTRATED (HUMULIN  R U-500 KWIKPEN) 500 UNIT/ML KwikPen INJECT 200 UNITS UNDER SKIN A DAY AS ADVISED (Patient taking differently: Inject 40-80 Units into the skin. Inject 80 units under the skin in the morning and 40 units in the evening) 24 mL 3   metFORMIN  (GLUCOPHAGE ) 1000 MG tablet TAKE 1 TABLET BY MOUTH TWICE  DAILY WITH A MEAL 180 tablet 1      Home: One level with 4 stairs/rails at entry   Functional History: Independent PTA but limited by chronic LBP with radiculopathy and bilateral knee pain  Functional Status:  Mobility: +2 max assist for bed mobility +2 mod assist in Stedy to attempt upright posture        ADL: Min to mod assist to sit at EOB.  Cognition:      Physical Exam: There were no vitals taken for this visit. Physical Exam Vitals and nursing note reviewed.  Constitutional:      General: He is not in acute distress.    Appearance: Normal appearance. He is obese.     Comments: Pt awake, alert, appropriate, very interactive, long beard; appears stated age, NAD; BMI 48  HENT:     Head: Normocephalic and atraumatic.     Right Ear: External ear normal.     Left Ear: External ear normal.     Nose: No congestion.     Mouth/Throat:     Mouth: Mucous membranes are dry.     Pharynx: No oropharyngeal exudate.  Eyes:     General:        Right eye: No discharge.     Extraocular Movements: Extraocular movements intact.  Cardiovascular:     Rate and Rhythm: Normal rate and regular rhythm.     Heart sounds: Normal heart sounds.  No murmur heard.    No gallop.  Pulmonary:     Effort: Pulmonary effort is normal. No respiratory distress.     Breath sounds: Normal breath sounds. No wheezing, rhonchi or rales.  Abdominal:     General: There is distension.     Palpations: Abdomen is soft.     Tenderness: There is no abdominal tenderness.     Comments: Very protuberant- hypoactive BS  Genitourinary:    Comments: Foley medium amber urine in bag Musculoskeletal:     Cervical  back: Neck supple. No tenderness.     Right lower leg: No edema.     Comments: Deltoids 2-/5 due to probable RTC tears; biceps 4+/5 on R 5-/5 on L; Triceps 4+/5 on R and 5-/5 on L; Grip and FA 4+/5 on R and 5-/5 on L- but very limited by having to do hand walking up body for shoulders LE's- RLE- HF 4-/5; KE 4+/5; DF/PF 5-/5 LLE- HF 2-/5; cannot extend or flex L knee due to "bad L knee"; 4+/5 DF and PF Literally cannot extend L knee past 45 degrees, but says can when standing with assistive device  Skin:    General: Skin is warm and dry.     Comments: Incision with some sanguinous drainage- tegaderm placed with dressing underneath it  Neurological:     Mental Status: He is alert and oriented to person, place, and time.     Comments: Decreased to light touch B/L at midcalf- worse on RLE to light touch and pinprick- absent to pinprick on bottom of feet  Psychiatric:        Mood and Affect: Mood normal.        Behavior: Behavior normal.     Comments: Extremely interactive-      Results for orders placed or performed during the hospital encounter of 03/07/24 (from the past 48 hours)  Glucose, capillary     Status: Abnormal   Collection Time: 03/07/24 12:17 PM  Result Value Ref Range   Glucose-Capillary 150 (H) 70 - 99 mg/dL    Comment: Glucose reference range applies only to samples taken after fasting for at least 8 hours.   No results found.    There were no vitals taken for this visit.  Medical Problem List and Plan: 1. Functional  deficits secondary to severe lumbar stenosis with B/L LE radiculopathy s/p PSF at L4-S1  -patient may  shower  -ELOS/Goals: `0-12 days min A- was using scooter outside home prior- had been bedbound for 6 weeks prior to admission  Admit to CIR 2.  Antithrombotics: -DVT/anticoagulation:  Mechanical: Sequential compression devices, entire leg Bilateral lower extremities -will need to check with surgeon at 7 days s/p surgery when can start DVT prophlaxis  -antiplatelet therapy: ASA 3. Pain Management:  Hydrocodone  ineffective--will change to oxycodone  which he has tried in the past. 4. Mood/Behavior/Sleep: LCSW to follow for evaluation and support.  -antipsychotic agents: N/A 5. Neuropsych/cognition: This patient is capable of making decisions on his own behalf. 6. Skin/Wound Care: Routine pressure relief measures.   --monitor drainage from wound. Dry dressing daily.  7. Fluids/Electrolytes/Nutrition: Monitor I/O. Check CMET in am.  8. T2DM w/neuropathy: Hgb A1c-5.9 and better controlled. On Novolin 100 units am/30 units pm prn BS>190 and Metformin  2000 mg daily am.  --Monitor BS ac/hs. Followed by Dr. Marianna Shirk --Currently on NPH 10 units bid w/novolog  6 units ac TID/HS?             --will d/c hs coverage and use SSI for now             --Resume metformin  in am and d/c NPH to help transition to home regimen. -Can use Libre to monitor CBGs  9.  ABLA: Has had drop in Hgb from 14.0 to 11.2 due to post op anemia  --monitor H/H for trend. Monitor for signs of bleeding.  10. Leucocytosis: WBC up to 13.3--likely reactive. Monitor for fevers and other signs of infection  --monitor drainage.  11. Hx of  DOE:  Echo 02/27/24 showed Moderate to severe concentric hypertrophy with EF 45-50% and mild LV hypokinesis.             --hx of mild asymptomatic tachycardia per PCP notes.  12. OSA: Does not use CPAP  13. 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 Rozelle Corning 15. Morbid obesity-BMI 48.46: RD consult for dietary education.  --Encourage/educate on importance of diet and wt loss to help promote health and mobility.  16. Urinary retention- came to use with Foley catheter- will remove in AM and do bladder scans to make sure is emptying-cath if volumes >350cc with coude' 17. OSA_ has CPAP 18. Constipation- LBM 5 days ago- pt refuses Sorbitol  because scared of diarrhea/accidents       Zelda Hickman, PA-C 03/07/2024   I have personally performed a face to face diagnostic evaluation of this patient and formulated the key components of the plan.  Additionally, I have personally reviewed laboratory data, imaging studies, as well as relevant notes and concur with the physician assistant's documentation above.   The patient's status has not changed from the original H&P.  Any changes in documentation from the acute care chart have been noted above.

## 2024-03-07 NOTE — PMR Pre-admission (Signed)
 PMR Admission Coordinator Pre-Admission Assessment  Patient: Christian [name] is an 73 y.o., male MRN: [id number] DOB: 03-10-1951 Height:   Weight:    Insurance Information HMO:     PPO:      PCP:      IPA:      80/20: yes     OTHER:  PRIMARY:Medicare A and B      Policy#: :   Subscriber:  CM Name:       Phone#:      Fax#:  Pre-Cert#: verified Health and safety inspector:  Benefits:  Phone #:      Name:  Eff. Date: 10/08/2015    Deduct: $1632      Out of Pocket Max: n/a      Life Max: n/a CIR: 100%      SNF: 20 full days Outpatient:      Co-Pay:  Home Health: 100%      Co-Pay:  DME:      Co-Pay:  Providers:  SECONDARY: BCBS Medicare Supplement      Policy#: [id number]     Phone#:   Financial Counselor:       Phone#:   The "Data Collection Information Summary" for patients in Inpatient Rehabilitation Facilities with attached "Privacy Act Statement-Health Care Records" was provided and verbally reviewed with: Patient  Emergency Contact Information Contact Information     Name Relation Home Work Mobile   Scarboro,Marie Spouse [phone]  [phone]      Other Contacts     Name Relation Home Work Mobile   Krysiak,Ian Son   [phone]       Current Medical History  Patient Admitting Diagnosis: Lumbar stenosis  History of Present Illness: Pt  is a 73 year old male with history of DM, HLD, HTN, Obesity, Sleep apnea radiculopathy, neuropathy, knee replacement in 2024 who presented to [facility] on 03/04/24 due to severe spinal stenosis and BLE radiculopathy. Neurosurgery there performed L4-s1 [place]/ PSF/TLIF . LSO brace recommended for mobility, Pt. With spine precautions. Post operatively, Pt. Reported decreased radicular pain. Post-op course relatively uncomplicated. Initially had a PCA and hemovac but those were discontinued 03/05/24. Some expected ABLA post op. Pt. Seen by PT/OT and they recommend CIR to assist return to PLOF.    Patient's medical  record from [facility]  has been reviewed by the rehabilitation admission coordinator and physician.   Past Medical History  Past Medical History:  Diagnosis Date   Chronic back pain 09/13/2013   Chronic pain syndrome 09/13/2013   GERD (gastroesophageal reflux disease)    hx. of   Gout 09/13/2013   Hemorrhoids    Hypertension    Morbid obesity with body mass index of 45.0-49.9 in adult [facility]) 09/13/2013   Osteoarthritis 09/13/2013   Pneumonia    hx. of   Seizures (HCC)    Sleep apnea    has never used C-pap machine due to insurance cost   Type 1 diabetes mellitus, uncontrolled     Has the patient had major surgery during 100 days prior to admission? Yes  Family History   family history includes Alcohol abuse in his father, paternal grandfather, and paternal grandmother; Arthritis in his maternal grandmother; Diabetes in his maternal aunt.  Current Medications  Current Outpatient Medications:    aspirin  81 MG tablet, Take 81 mg by mouth daily., Disp: , Rfl:    BD PEN NEEDLE NANO 2ND GEN 32G X 4 MM MISC, USE AS DIRECTED  5 TIMES A DAY WITH INSULIN  INJECTIONS (Patient not taking: Reported on 02/26/2024), Disp: 200 each, Rfl: 5   Continuous Glucose Sensor (FREESTYLE LIBRE 3 SENSOR) MISC, by Does not apply route. Use to check glucose continuously change every 14 days, Disp: , Rfl:    diclofenac  (VOLTAREN ) 75 MG EC tablet, TAKE 1 TABLET (75 MG TOTAL) BY MOUTH 2 (TWO) TIMES DAILY. WITH OMEPRAZOLE , Disp: 180 tablet, Rfl: 3   DULoxetine  (CYMBALTA ) 60 MG capsule, Take 60 mg by mouth daily., Disp: , Rfl:    insulin  regular human CONCENTRATED (HUMULIN  R U-500 KWIKPEN) 500 UNIT/ML KwikPen, INJECT 200 UNITS UNDER SKIN A DAY AS ADVISED (Patient taking differently: Inject 40-80 Units into the skin. Inject 80 units under the skin in the morning and 40 units in the evening), Disp: 24 mL, Rfl: 3   metFORMIN  (GLUCOPHAGE ) 1000 MG tablet, TAKE 1 TABLET BY MOUTH TWICE  DAILY WITH A  MEAL, Disp: 180 tablet, Rfl: 1  Patients Current Diet: Diet regular  Precautions / Restrictions Precautions: Fall; Other (comment) Weight Bearing Restrictions Per Provider Order: No    Has the patient had 2 or more falls or a fall with injury in the past year? Yes  Prior Activity Level Community (5-7x/wk): Pt. active in the community PTA   Prior Functional Level Self Care: Did the patient need help bathing, dressing, using the toilet or eating? Independent  Indoor Mobility: Did the patient need assistance with walking from room to room (with or without device)? Independent  Stairs: Did the patient need assistance with internal or external stairs (with or without device)? Independent  Functional Cognition: Did the patient need help planning regular tasks such as shopping or remembering to take medications? Independent  Patient Information Are you of Hispanic, Latino/a,or Spanish origin?: A. No, not of Hispanic, Latino/a, or Spanish origin What is your race?: A. White Do you need or want an interpreter to communicate with a doctor or health care staff?: 0. No  Patient's Response To:  Health Literacy and Transportation Is the patient able to respond to health literacy and transportation needs?: Yes Health Literacy - How often do you need to have someone help you when you read instructions, pamphlets, or other written material from your doctor or pharmacy?: Never In the past 12 months, has lack of transportation kept you from medical appointments or from getting medications?: No In the past 12 months, has lack of transportation kept you from meetings, work, or from getting things needed for daily living?: No  Home Assistive Devices / Web designer (specify quad or straight); Raised toilet seat with rails   Prior Device Use: Indicate devices/aids used by the patient prior to current illness, exacerbation or injury?  Quad cane   Prior Functional Level Current Functional Level   Bed Mobility  Independent Min assist   Transfers  Independent  Min assist   Mobility - Walk/Wheelchair  Independent  Other   Upper Body Dressing  Independent  Min assist   Lower Body Dressing  Independent  Mod assist   Grooming  Independent  Min assist   Eating/Drinking  Independent  Mod Independent   Toilet Transfer  Independent  Mod assist   Bladder Continence   Continent  Continent   Bowel Management  Continent  Continent   Stair Climbing  Independent  Other   Communication  Independent      Memory  inedependent       Special Needs/ Care Considerations Skin surgical incision and Diabetic management yes  Previous Home Environment (from acute therapy documentation) Living Arrangements: Spouse/significant other Available Help at Discharge: Family Type of Home: House Home Layout: One level Home Access: Stairs to enter Entrance Stairs-Rails: Right; Left Entrance Stairs-Number of Steps: 4 Bathroom Shower/Tub: Associate Professor: No Home Care Services: No   Discharge Living Setting Plans for Discharge Living Setting: Patient's home Type of Home at Discharge: House Discharge Home Layout: One level Discharge Home Access: Stairs to enter Entrance Stairs-Rails: Right; Left Entrance Stairs-Number of Steps: 4 Discharge Bathroom Shower/Tub: Tub/shower unit Discharge Bathroom Toilet: Standard Discharge Bathroom Accessibility: No Does the patient have any problems obtaining your medications?: No   Social/Family/Support Systems Patient Roles: Spouse Contact Information: [phone] Anticipated Caregiver: Kavell Saunder Ability/Limitations of Caregiver: 24/7 Caregiver Availability: 24/7 Discharge Plan Discussed with Primary Caregiver: Yes Is Caregiver In Agreement with Plan?: Yes Does Caregiver/Family have Issues with Lodging/Transportation while Pt is in Rehab?: Yes   Goals Patient/Family  Goal for Rehab: PT/OT Min A Expected length of stay: 10-12 days Pt/Family Agrees to Admission and willing to participate: Yes Program Orientation Provided & Reviewed with Pt/Caregiver Including Roles  & Responsibilities: Yes   Decrease burden of Care through IP rehab admission: not anticiapted  Possible need for SNF placement upon discharge: not anticipated  Patient Condition: I have reviewed medical records from [facility], spoken with CM, and patient. I met with patient at the bedside for inpatient rehabilitation assessment.  Patient will benefit from ongoing PT and OT, can actively participate in 3 hours of therapy a day 5 days of the week, and can make measurable gains during the admission.  Patient will also benefit from the coordinated team approach during an Inpatient Acute Rehabilitation admission.  The patient will receive intensive therapy as well as Rehabilitation physician, nursing, social worker, and care management interventions.  Due to skin/wound care, disease management, medication administration, pain management, and patient education the patient requires 24 hour a day rehabilitation nursing.  The patient is currently min a to max A with mobility and basic ADLs.  Discharge setting and therapy post discharge at home with home health is anticipated.  Patient has agreed to participate in the Acute Inpatient Rehabilitation Program and will admit today.  Preadmission Screen Completed By:  Dorena Gander, 03/07/2024 8:15 AM ______________________________________________________________________   Discussed status with Dr. Lovorn on 900 at 03/07/24 and received approval for admission today.  Admission Coordinator:  Dorena Gander, CCC-SLP, time 900/Date 03/07/24   Assessment/Plan: Diagnosis: Lumbar stenosis and B/L LE radiculopathy s/p PSF Does the need for close, 24 hr/day Medical supervision in concert with the patient's rehab needs make it unreasonable for this  patient to be served in a less intensive setting? Yes Co-Morbidities requiring supervision/potential complications: DM, HTN, HLD, morbid obesity, TKR; OSA Due to bladder management, bowel management, safety, skin/wound care, disease management, medication administration, pain management, and patient education, does the patient require 24 hr/day rehab nursing? Yes Does the patient require coordinated care of a physician, rehab nurse, PT, OT, and SLP to address physical and functional deficits in the context of the above medical diagnosis(es)? Yes Addressing deficits in the following areas: balance, endurance, locomotion, strength, transferring, bowel/bladder control, bathing, dressing, feeding, grooming, and toileting Can the patient actively participate in an intensive therapy program of at least 3 hrs of therapy 5 days a week? Yes The potential for patient to make measurable gains while on inpatient rehab is good Anticipated functional outcomes upon discharge from inpatient  rehab: supervision and min assist PT, supervision and min assist OT, n/a SLP Estimated rehab length of stay to reach the above functional goals is: 10-12 days Anticipated discharge destination: Home 10. Overall Rehab/Functional Prognosis: good  MD Signature

## 2024-03-07 NOTE — Progress Notes (Signed)
 PMR Admission Coordinator Pre-Admission Assessment   Patient: Christian [name] is an 73 y.o., male MRN: [id number] DOB: 1951/09/13 Height:   Weight:     Insurance Information HMO:     PPO:      PCP:      IPA:      80/20: yes     OTHER:  PRIMARY:Medicare A and B      Policy#: :   Subscriber:  CM Name:       Phone#:      Fax#:  Pre-Cert#: verified Health and safety inspector:  Benefits:  Phone #:      Name:  Eff. Date: 10/08/2015    Deduct: $1632      Out of Pocket Max: n/a      Life Max: n/a CIR: 100%      SNF: 20 full days Outpatient:      Co-Pay:  Home Health: 100%      Co-Pay:  DME:      Co-Pay:  Providers:  SECONDARY: BCBS Medicare Supplement      Policy#: [id number]     Phone#:    Financial Counselor:       Phone#:    The "Data Collection Information Summary" for patients in Inpatient Rehabilitation Facilities with attached "Privacy Act Statement-Health Care Records" was provided and verbally reviewed with: Patient   Emergency Contact Information Contact Information       Name Relation Home Work Mobile    Archuleta,Marie Spouse [phone]   [phone]         Other Contacts       Name Relation Home Work Mobile    Deboy,Ian Son     [phone]           Current Medical History  Patient Admitting Diagnosis: Lumbar stenosis  History of Present Illness: Pt  is a 73 year old male with history of DM, HLD, HTN, Obesity, Sleep apnea radiculopathy, neuropathy, knee replacement in 2024 who presented to [facility] on 03/04/24 due to severe spinal stenosis and BLE radiculopathy. Neurosurgery there performed L4-s1 Lami/ PSF/TLIF . LSO brace recommended for mobility, Pt. With spine precautions. Post operatively, Pt. Reported decreased radicular pain. Post-op course relatively uncomplicated. Initially had a PCA and hemovac but those were discontinued 03/05/24. Some expected ABLA post op. Pt. Seen by PT/OT and they recommend CIR to assist return to PLOF.       Patient's medical record from [facility]  has been reviewed by the rehabilitation admission coordinator and physician.    Past Medical History      Past Medical History:  Diagnosis Date   Chronic back pain 09/13/2013   Chronic pain syndrome 09/13/2013   GERD (gastroesophageal reflux disease)      hx. of   Gout 09/13/2013   Hemorrhoids     Hypertension     Morbid obesity with body mass index of 45.0-49.9 in adult [facility]) 09/13/2013   Osteoarthritis 09/13/2013   Pneumonia      hx. of   Seizures (HCC)     Sleep apnea      has never used C-pap machine due to insurance cost   Type 1 diabetes mellitus, uncontrolled            Has the patient had major surgery during 100 days prior to admission? Yes   Family History   family history includes Alcohol abuse in his father, paternal grandfather, and paternal grandmother; Arthritis in his maternal grandmother;  Diabetes in his maternal aunt.   Current Medications  Current Medications    Current Outpatient Medications:    aspirin  81 MG tablet, Take 81 mg by mouth daily., Disp: , Rfl:    BD PEN NEEDLE NANO 2ND GEN 32G X 4 MM MISC, USE AS DIRECTED 5 TIMES A DAY WITH INSULIN  INJECTIONS (Patient not taking: Reported on 02/26/2024), Disp: 200 each, Rfl: 5   Continuous Glucose Sensor (FREESTYLE LIBRE 3 SENSOR) MISC, by Does not apply route. Use to check glucose continuously change every 14 days, Disp: , Rfl:    diclofenac  (VOLTAREN ) 75 MG EC tablet, TAKE 1 TABLET (75 MG TOTAL) BY MOUTH 2 (TWO) TIMES DAILY. WITH OMEPRAZOLE , Disp: 180 tablet, Rfl: 3   DULoxetine  (CYMBALTA ) 60 MG capsule, Take 60 mg by mouth daily., Disp: , Rfl:    insulin  regular human CONCENTRATED (HUMULIN  R U-500 KWIKPEN) 500 UNIT/ML KwikPen, INJECT 200 UNITS UNDER SKIN A DAY AS ADVISED (Patient taking differently: Inject 40-80 Units into the skin. Inject 80 units under the skin in the morning and 40 units in the evening), Disp: 24 mL, Rfl: 3   metFORMIN   (GLUCOPHAGE ) 1000 MG tablet, TAKE 1 TABLET BY MOUTH TWICE  DAILY WITH A MEAL, Disp: 180 tablet, Rfl: 1     Patients Current Diet: Diet regular   Precautions / Restrictions Precautions: Fall; Other (comment) Weight Bearing Restrictions Per Provider Order: No     Has the patient had 2 or more falls or a fall with injury in the past year? Yes   Prior Activity Level Community (5-7x/wk): Pt. active in the community PTA     Prior Functional Level Self Care: Did the patient need help bathing, dressing, using the toilet or eating? Independent   Indoor Mobility: Did the patient need assistance with walking from room to room (with or without device)? Independent   Stairs: Did the patient need assistance with internal or external stairs (with or without device)? Independent   Functional Cognition: Did the patient need help planning regular tasks such as shopping or remembering to take medications? Independent   Patient Information Are you of Hispanic, Latino/a,or Spanish origin?: A. No, not of Hispanic, Latino/a, or Spanish origin What is your race?: A. White Do you need or want an interpreter to communicate with a doctor or health care staff?: 0. No   Patient's Response To:  Health Literacy and Transportation Is the patient able to respond to health literacy and transportation needs?: Yes Health Literacy - How often do you need to have someone help you when you read instructions, pamphlets, or other written material from your doctor or pharmacy?: Never In the past 12 months, has lack of transportation kept you from medical appointments or from getting medications?: No In the past 12 months, has lack of transportation kept you from meetings, work, or from getting things needed for daily living?: No   Home Assistive Devices / Web designer (specify quad or straight); Raised toilet seat with rails     Prior Device Use: Indicate devices/aids used by the patient prior to current illness,  exacerbation or injury?  Quad cane     Prior Functional Level Current Functional Level  Bed Mobility   Independent Min assist    Transfers   Independent   Min assist    Mobility - Walk/Wheelchair   Independent   Other    Upper Body Dressing   Independent   Min assist    Lower Body Dressing   Independent  Mod assist    Grooming   Independent   Min assist    Eating/Drinking   Independent   Mod Independent    Toilet Transfer   Independent   Mod assist    Bladder Continence    Continent   Continent    Bowel Management   Continent   Continent    Stair Chartered loss adjuster   Other    Communication   Independent      Memory   inedependent      Special Needs/ Care Considerations Skin surgical incision and Diabetic management yes   Previous Home Environment (from acute therapy documentation) Living Arrangements: Spouse/significant other Available Help at Discharge: Family Type of Home: House Home Layout: One level Home Access: Stairs to enter Entrance Stairs-Rails: Right; Left Entrance Stairs-Number of Steps: 4 Bathroom Shower/Tub: Associate Professor: No Home Care Services: No     Discharge Living Setting Plans for Discharge Living Setting: Patient's home Type of Home at Discharge: House Discharge Home Layout: One level Discharge Home Access: Stairs to enter Entrance Stairs-Rails: Right; Left Entrance Stairs-Number of Steps: 4 Discharge Bathroom Shower/Tub: Tub/shower unit Discharge Bathroom Toilet: Standard Discharge Bathroom Accessibility: No Does the patient have any problems obtaining your medications?: No     Social/Family/Support Systems Patient Roles: Spouse Contact Information: [phone] Anticipated Caregiver: Aiydan Strabala Ability/Limitations of Caregiver: 24/7 Caregiver Availability: 24/7 Discharge Plan Discussed with Primary Caregiver: Yes Is Caregiver In Agreement with  Plan?: Yes Does Caregiver/Family have Issues with Lodging/Transportation while Pt is in Rehab?: Yes     Goals Patient/Family Goal for Rehab: PT/OT Min A Expected length of stay: 10-12 days Pt/Family Agrees to Admission and willing to participate: Yes Program Orientation Provided & Reviewed with Pt/Caregiver Including Roles  & Responsibilities: Yes     Decrease burden of Care through IP rehab admission: not anticiapted   Possible need for SNF placement upon discharge: not anticipated   Patient Condition: I have reviewed medical records from [facility], spoken with CM, and patient. I met with patient at the bedside for inpatient rehabilitation assessment.  Patient will benefit from ongoing PT and OT, can actively participate in 3 hours of therapy a day 5 days of the week, and can make measurable gains during the admission.  Patient will also benefit from the coordinated team approach during an Inpatient Acute Rehabilitation admission.  The patient will receive intensive therapy as well as Rehabilitation physician, nursing, social worker, and care management interventions.  Due to skin/wound care, disease management, medication administration, pain management, and patient education the patient requires 24 hour a day rehabilitation nursing.  The patient is currently min a to max A with mobility and basic ADLs.  Discharge setting and therapy post discharge at home with home health is anticipated.  Patient has agreed to participate in the Acute Inpatient Rehabilitation Program and will admit today.   Preadmission Screen Completed By:  Dorena Gander, 03/07/2024 8:15 AM ______________________________________________________________________   Discussed status with Dr. Lovorn on 900 at 03/07/24 and received approval for admission today.   Admission Coordinator:  Dorena Gander, CCC-SLP, time 900/Date 03/07/24    Assessment/Plan: Diagnosis: Lumbar stenosis and B/L LE radiculopathy s/p  PSF Does the need for close, 24 hr/day Medical supervision in concert with the patient's rehab needs make it unreasonable for this patient to be served in a less intensive setting? Yes Co-Morbidities requiring supervision/potential complications: DM, HTN, HLD, morbid obesity, TKR; OSA Due to  bladder management, bowel management, safety, skin/wound care, disease management, medication administration, pain management, and patient education, does the patient require 24 hr/day rehab nursing? Yes Does the patient require coordinated care of a physician, rehab nurse, PT, OT, and SLP to address physical and functional deficits in the context of the above medical diagnosis(es)? Yes Addressing deficits in the following areas: balance, endurance, locomotion, strength, transferring, bowel/bladder control, bathing, dressing, feeding, grooming, and toileting Can the patient actively participate in an intensive therapy program of at least 3 hrs of therapy 5 days a week? Yes The potential for patient to make measurable gains while on inpatient rehab is good Anticipated functional outcomes upon discharge from inpatient rehab: supervision and min assist PT, supervision and min assist OT, n/a SLP Estimated rehab length of stay to reach the above functional goals is: 10-12 days Anticipated discharge destination: Home 10. Overall Rehab/Functional Prognosis: good   MD Signature

## 2024-03-08 DIAGNOSIS — M48062 Spinal stenosis, lumbar region with neurogenic claudication: Secondary | ICD-10-CM | POA: Diagnosis not present

## 2024-03-08 LAB — CBC WITH DIFFERENTIAL/PLATELET
Abs Immature Granulocytes: 0.05 10*3/uL (ref 0.00–0.07)
Basophils Absolute: 0.1 10*3/uL (ref 0.0–0.1)
Basophils Relative: 1 %
Eosinophils Absolute: 0.4 10*3/uL (ref 0.0–0.5)
Eosinophils Relative: 4 %
HCT: 32.2 % — ABNORMAL LOW (ref 39.0–52.0)
Hemoglobin: 10.7 g/dL — ABNORMAL LOW (ref 13.0–17.0)
Immature Granulocytes: 1 %
Lymphocytes Relative: 15 %
Lymphs Abs: 1.5 10*3/uL (ref 0.7–4.0)
MCH: 32.3 pg (ref 26.0–34.0)
MCHC: 33.2 g/dL (ref 30.0–36.0)
MCV: 97.3 fL (ref 80.0–100.0)
Monocytes Absolute: 0.9 10*3/uL (ref 0.1–1.0)
Monocytes Relative: 8 %
Neutro Abs: 7.5 10*3/uL (ref 1.7–7.7)
Neutrophils Relative %: 71 %
Platelets: 199 10*3/uL (ref 150–400)
RBC: 3.31 MIL/uL — ABNORMAL LOW (ref 4.22–5.81)
RDW: 13.8 % (ref 11.5–15.5)
WBC: 10.4 10*3/uL (ref 4.0–10.5)
nRBC: 0 % (ref 0.0–0.2)

## 2024-03-08 LAB — COMPREHENSIVE METABOLIC PANEL WITH GFR
ALT: 41 U/L (ref 0–44)
AST: 41 U/L (ref 15–41)
Albumin: 2.5 g/dL — ABNORMAL LOW (ref 3.5–5.0)
Alkaline Phosphatase: 72 U/L (ref 38–126)
Anion gap: 6 (ref 5–15)
BUN: 11 mg/dL (ref 8–23)
CO2: 25 mmol/L (ref 22–32)
Calcium: 8.3 mg/dL — ABNORMAL LOW (ref 8.9–10.3)
Chloride: 104 mmol/L (ref 98–111)
Creatinine, Ser: 0.66 mg/dL (ref 0.61–1.24)
GFR, Estimated: >60 mL/min (ref >60)
Glucose, Bld: 190 mg/dL — ABNORMAL HIGH (ref 70–99)
Potassium: 3.8 mmol/L (ref 3.5–5.1)
Sodium: 135 mmol/L (ref 135–145)
Total Bilirubin: 0.8 mg/dL (ref 0.0–1.2)
Total Protein: 6.1 g/dL — ABNORMAL LOW (ref 6.5–8.1)

## 2024-03-08 LAB — GLUCOSE, CAPILLARY
Glucose-Capillary: 184 mg/dL — ABNORMAL HIGH (ref 70–99)
Glucose-Capillary: 168 mg/dL — ABNORMAL HIGH (ref 70–99)
Glucose-Capillary: 168 mg/dL — ABNORMAL HIGH (ref 70–99)
Glucose-Capillary: 173 mg/dL — ABNORMAL HIGH (ref 70–99)

## 2024-03-08 MED ORDER — INSULIN REGULAR HUMAN (CONC) 500 UNIT/ML ~~LOC~~ SOPN
6.0000 [IU] | PEN_INJECTOR | Freq: Three times a day (TID) | SUBCUTANEOUS | Status: DC
  Administered 2024-03-09 – 2024-03-18 (×27): 6 [IU] via SUBCUTANEOUS
  Filled 2024-03-08 (×3): qty 3

## 2024-03-08 MED ORDER — DICLOFENAC SODIUM 1 % EX GEL
2.0000 g | Freq: Four times a day (QID) | CUTANEOUS | Status: DC
  Administered 2024-03-08 – 2024-03-19 (×28): 2 g via TOPICAL
  Filled 2024-03-08: qty 100

## 2024-03-08 NOTE — Progress Notes (Incomplete)
   03/08/24 1433  CareTool - Eating  Eating Assist Level Set up assist  CareTool - Oral Care  Oral Care Assist Level Set up assist  CareTool - Bathing  Body parts bathed by helper Right arm;Left upper leg;Left arm;Right lower leg;Chest;Abdomen;Left lower leg;Front perineal area;Buttocks;Right upper leg  Assist Level Total Assistance - Patient < 25%  CareTool- Upper Body Dressing (including orthotics  What is the patient wearing? Pull over shirt  Assist Level Total Assistance - Patient < 25%  CareTool - Lower Body Dressing (excluding footwear)  What is the patient wearing? Underwear/pull up;Pants  Assist for lower body dressing Total Assistance - Patient < 25%  CareTool - Putting on/Taking off footwear  Assist for footwear Moderate Assistance - Patient 50 - 74%

## 2024-03-08 NOTE — Progress Notes (Signed)
 PROGRESS NOTE   Subjective/Complaints:  Pt reports shoulder pain/weakness is chronic.  Got foley out this AM per pt and nurse- Notes want s to use his Sensor to manage his CBGs.  Also wants to go back on Humolog- - 100 units in AM and 30 units at night IF BG >190.   Used Humalin R 500- d/w PA- she will get his insulin  back more on regular dosing for pt.   Objective:   No results found. Recent Labs    03/08/24 0633  WBC 10.4  HGB 10.7*  HCT 32.2*  PLT 199   Recent Labs    03/08/24 0633  NA 135  K 3.8  CL 104  CO2 25  GLUCOSE 190*  BUN 11  CREATININE 0.66  CALCIUM 8.3*    Intake/Output Summary (Last 24 hours) at 03/08/2024 1252 Last data filed at 03/08/2024 0800 Gross per 24 hour  Intake 740 ml  Output 1200 ml  Net -460 ml        Physical Exam: Vital Signs Blood pressure 131/77, pulse (!) 103, temperature 97.6 F (36.4 C), temperature source Oral, resp. rate 16, height 5\' 6"  (1.676 m), weight (!) 136.2 kg, SpO2 99%.   General: awake, alert, appropriate, sitting up in bed; cannot lift shoulders more than 20-30 degrees B/L; NAD HENT: conjugate gaze; oropharynx moist CV: regular rhythm, mildly tachycardic  rate; no JVD Pulmonary: CTA B/L; no W/R/R- good air movement- mild cough this AM x1 GI: soft, NT, ND, (+)BS- protuberant- BMI 48 Psychiatric: appropriate- interactive Neurological: Ox3 Musculoskeletal:     Cervical back: Neck supple. No tenderness.     Right lower leg: No edema.     Comments: Deltoids 2-/5 due to probable RTC tears; biceps 4+/5 on R 5-/5 on L; Triceps 4+/5 on R and 5-/5 on L; Grip and FA 4+/5 on R and 5-/5 on L- but very limited by having to do hand walking up body for shoulders LE's- RLE- HF 4-/5; KE 4+/5; DF/PF 5-/5 LLE- HF 2-/5; cannot extend or flex L knee due to "bad L knee"; 4+/5 DF and PF Literally cannot extend L knee past 45 degrees, but says can when standing with assistive  device  Skin:    General: Skin is warm and dry.     Comments: Incision with some sanguinous drainage- tegaderm placed with dressing underneath it  Neurological:     Mental Status: He is alert and oriented to person, place, and time.     Comments: Decreased to light touch B/L at midcalf- worse on RLE to light touch and pinprick- absent to pinprick on bottom of feet   Assessment/Plan: 1. Functional deficits which require 3+ hours per day of interdisciplinary therapy in a comprehensive inpatient rehab setting. Physiatrist is providing close team supervision and 24 hour management of active medical problems listed below. Physiatrist and rehab team continue to assess barriers to discharge/monitor patient progress toward functional and medical goals  Care Tool:  Bathing              Bathing assist       Upper Body Dressing/Undressing Upper body dressing        Upper body  assist      Lower Body Dressing/Undressing Lower body dressing            Lower body assist       Toileting Toileting    Toileting assist Assist for toileting: Moderate Assistance - Patient 50 - 74%     Transfers Chair/bed transfer  Transfers assist     Chair/bed transfer assist level: Moderate Assistance - Patient 50 - 74%     Locomotion Ambulation   Ambulation assist              Walk 10 feet activity   Assist           Walk 50 feet activity   Assist           Walk 150 feet activity   Assist           Walk 10 feet on uneven surface  activity   Assist           Wheelchair     Assist               Wheelchair 50 feet with 2 turns activity    Assist            Wheelchair 150 feet activity     Assist          Blood pressure 131/77, pulse (!) 103, temperature 97.6 F (36.4 C), temperature source Oral, resp. rate 16, height 5\' 6"  (1.676 m), weight (!) 136.2 kg, SpO2 99%.  Medical Problem List and Plan: 1. Functional  deficits secondary to severe lumbar stenosis with B/L LE radiculopathy s/p PSF at L4-S1             -patient may  shower             -ELOS/Goals: `0-12 days min A- was using scooter outside home prior- had been bedbound for 6 weeks prior to admission             Admit to CIR  First day of evaluations- con't CIR PT and OT 2.  Antithrombotics: -DVT/anticoagulation:  Mechanical: Sequential compression devices, entire leg Bilateral lower extremities -will need to check with surgeon at 7 days s/p surgery when can start DVT prophylaxis 5/2- will call Surgeon Monday to get ok to put on Lovenox             -antiplatelet therapy: ASA 3. Pain Management:  Hydrocodone  ineffective--will change to oxycodone  which he has tried in the past. 4. Mood/Behavior/Sleep: LCSW to follow for evaluation and support.             -antipsychotic agents: N/A 5. Neuropsych/cognition: This patient is capable of making decisions on his own behalf. 6. Skin/Wound Care: Routine pressure relief measures.              --monitor drainage from wound. Dry dressing daily.  7. Fluids/Electrolytes/Nutrition: Monitor I/O. Check CMET in am.  8. T2DM w/neuropathy: Hgb A1c-5.9 and better controlled. On Novolin 100 units am/30 units pm prn BS>190 and Metformin  2000 mg daily am.  --Monitor BS ac/hs. Followed by Dr. Marianna Shirk --Currently on NPH 10 units bid w/novolog  6 units ac TID/HS?             --will d/c hs coverage and use SSI for now             --Resume metformin  in am and d/c NPH to help transition to home regimen. -Can use Libre to monitor CBGs  5/2- will get pt  back on some of home dose and titrate over-  9.  ABLA: Has had drop in Hgb from 14.0 to 11.2 due to post op anemia             --monitor H/H for trend. Monitor for signs of bleeding. 5/2- Hb 10.7- will monitor for bleeding- incision shows scant drainage, but not enough to explain drop- likely due to not being checked for a few days  10. Leucocytosis: WBC up to  13.3--likely reactive. Monitor for fevers and other signs of infection  5/2- WBC down to 10.4k- monitor clinically             --monitor drainage.  11. Hx of  DOE:  Echo 02/27/24 showed Moderate to severe concentric hypertrophy with EF 45-50% and mild LV hypokinesis.             --hx of mild asymptomatic tachycardia per PCP notes.  12. 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 Rozelle Corning 15. Morbid obesity-BMI 48.46: RD consult for dietary education.  --Encourage/educate on importance of diet and wt loss to help promote health and mobility.  16. Urinary retention- came to use with Foley catheter- will remove in AM and do bladder scans to make sure is emptying-cath if volumes >350cc with coude'  5/2- removed foley this AM- hasn't been 6 hours yet, but in 1-2 hours, will need to be cathed if hasn't voided 17. OSA_ has CPAP 18. Constipation- LBM 5 days ago- pt refuses Sorbitol  because scared of diarrhea/accidents   5/2- no BM yet- if no BM by tomorrow- needs to get cleaned out some way over weekend  I spent a total of 41   minutes on total care today- >50% coordination of care- due to d/w nursing about bladder, and foley- also about bowels- and PA about insulin  plans.        LOS: 1 days A FACE TO FACE EVALUATION WAS PERFORMED  Jemarcus Dougal 03/08/2024, 12:52 PM

## 2024-03-08 NOTE — IPOC Note (Signed)
 Overall Plan of Care [facility]) Patient Details Name: Christian [name] MRN: [id number] DOB: 02-Jul-1951  Admitting Diagnosis: Neurogenic claudication due to lumbar spinal stenosis  Hospital Problems: Principal Problem:   Neurogenic claudication due to lumbar spinal stenosis     Functional Problem List: Nursing Edema, Endurance, Medication Management, Motor, Pain, Perception, Safety, Skin Integrity  PT Balance, Skin Integrity, Pain, Safety, Motor, Sensory, Endurance  OT Balance, Pain, Safety, Motor, Skin Integrity  SLP    TR         Basic ADL's: OT Bathing, Dressing, Toileting     Advanced  ADL's: OT       Transfers: PT Bed Mobility, Bed to Chair, Customer service manager, Tub/Shower     Locomotion: PT Ambulation, Psychologist, prison and probation services, Stairs     Additional Impairments: OT None  SLP        TR      Anticipated Outcomes Item Anticipated Outcome  Self Feeding n/a  Swallowing      Basic self-care  min A  Toileting  supervision   Bathroom Transfers supervision  Bowel/Bladder  continent of bowel and bladder  Transfers  supervision with LRAD  Locomotion  supervision with LRAD 150 ft  Communication     Cognition     Pain  <4 w/ prns  Safety/Judgment  anage safety with minimal assistance   Therapy Plan: PT Intensity: Minimum of 1-2 x/day ,45 to 90 minutes PT Frequency: 5 out of 7 days PT Duration Estimated Length of Stay: 10-12 days OT Intensity: Minimum of 1-2 x/day, 45 to 90 minutes OT Frequency: 5 out of 7 days OT Duration/Estimated Length of Stay: 10 days     Team Interventions: Nursing Interventions Skin Care/Wound Management, Discharge Planning, Medication Management, Pain Management, Disease Management/Prevention, Patient/Family Education  PT interventions Ambulation/gait training, Cognitive remediation/compensation, Discharge planning, DME/adaptive equipment instruction, Functional mobility training, Pain management, Psychosocial support,  Splinting/orthotics, Therapeutic Activities, UE/LE Strength taining/ROM, Visual/perceptual remediation/compensation, Wheelchair propulsion/positioning, UE/LE Coordination activities, Therapeutic Exercise, Stair training, Skin care/wound management, Patient/family education, Neuromuscular re-education, Functional electrical stimulation, Disease management/prevention, Firefighter, Warden/ranger  OT Interventions Warden/ranger, Discharge planning, Pain management, Self Care/advanced ADL retraining, Therapeutic Activities, Disease mangement/prevention, Functional mobility training, Patient/family education, Skin care/wound managment, Therapeutic Exercise, Community reintegration, Fish farm manager, Neuromuscular re-education, Psychosocial support, UE/LE Strength taining/ROM, Splinting/orthotics  SLP Interventions    TR Interventions    SW/CM Interventions Discharge Planning, Psychosocial Support, Patient/Family Education   Barriers to Discharge MD  Medical stability, Home enviroment access/loayout, Incontinence, Neurogenic bowel and bladder, Wound care, Weight, Weight bearing restrictions, and lack of shoulder movement  Nursing Decreased caregiver support, Home environment access/layout Discharge:House  Home Layout: One level  Home Access: Stairs to enter  Entrance Stairs-Rails: Right; Left  Entrance Stairs-Number of Steps: 4  PT Inaccessible home environment, Home environment access/layout, Decreased caregiver support, Weight    OT      SLP      SW       Team Discharge Planning: Destination: PT-Home ,OT- Home , SLP-  Projected Follow-up: PT-Home health PT, OT-  Home health OT, SLP-  Projected Equipment Needs: PT-To be determined, OT- To be determined, SLP-  Equipment Details: PT- , OT-  Patient/family involved in discharge planning: PT- Patient,  OT-Patient, SLP-   MD ELOS: 10-12 days Medical Rehab Prognosis:  Good Assessment: The  patient has been admitted for CIR therapies with the diagnosis of Lumbar stenosis s/p PSF. The team will be addressing functional mobility, strength, stamina, balance, safety, adaptive  techniques and equipment, self-care, bowel and bladder mgt, patient and caregiver education, shoulder ROM- for decrease in function. Goals have been set at s/u to min A. Anticipated discharge destination is home with wife.        See Team Conference Notes for weekly updates to the plan of care

## 2024-03-08 NOTE — Evaluation (Signed)
 Occupational Therapy Assessment and Plan  Patient Details  Name: Christian [name] MRN: [id number] Date of Birth: 03-16-1951  OT Diagnosis: acute pain and muscle weakness (generalized) Rehab Potential: Rehab Potential (ACUTE ONLY): Good ELOS: 10 days   Today's Date: 03/08/2024 OT Individual Time: 1105-1200 OT Individual Time Calculation (min): 55 min     Hospital Problem: Principal Problem:   Neurogenic claudication due to lumbar spinal stenosis   Past Medical History:  Past Medical History:  Diagnosis Date   Arthritis of left knee    Chronic back pain 09/13/2013   Chronic pain syndrome 09/13/2013   Diabetic ulcer of right ankle (HCC)    GERD (gastroesophageal reflux disease)    hx. of   Gout 09/13/2013   Hemorrhoids    Hypertension    Morbid obesity with body mass index of 45.0-49.9 in adult [facility]) 09/13/2013   Osteoarthritis 09/13/2013   Pneumonia    hx. of   Seizures (HCC)    Sleep apnea    has never used C-pap machine due to insurance cost   Type 1 diabetes mellitus, uncontrolled    Past Surgical History:  Past Surgical History:  Procedure Laterality Date   broken toe 35 years ago     COLONOSCOPY WITH PROPOFOL  N/A 12/15/2022   Procedure: COLONOSCOPY WITH PROPOFOL ;  Surgeon: Luke Salaam, MD;  Location: [facility] END[facility];  Service: Gastroenterology;  Laterality: N/A;   IR EMBO ARTERIAL NOT HEMORR HEMANG INC GUIDE ROADMAPPING  04/27/2023   IR RADIOLOGIST EVAL & MGMT  03/29/2023   IR RADIOLOGIST EVAL & MGMT  08/02/2023   TOTAL KNEE ARTHROPLASTY Right 05/01/2015   Procedure: RIGHT TOTAL KNEE ARTHROPLASTY;  Surgeon: Arnie Lao, MD;  Location: [facility] ORS;  Service: Orthopedics;  Laterality: Right;   WISDOM TOOTH EXTRACTION      Assessment & Plan Clinical Impression: Patient is a 73 y.o. year old male R handed  male with history of T2DM with peripheral neuropathy, HTN, morbid obesity with BMI-46, OA bilateral knees- Left> right, , left shoulder pain due to impingement,  And RTC injuries, lumbar stenosis with radiculopathy RLE and neurogenic claudication who elected to undergo L4-S1 laminectomy with trans-foraminal interbody fusion by Dr. Faylene Hoots at [facility] on 03/04/24. Post op reported to have improvement in radiculopathy but continues to be limited by pain and weakness. Hemovac removed on 04/29 and to wear LSO when out of bed. Post op with ABLA and rise in WBC felt to be reactive in nature. He was    He was independent prior to admission but has been limited to bed due to pain for 6 weeks PTA. He was able to furniture walk or use cane for short distances and needed assistance to get in tub/tub seat. Uses electric scooter when out of home.     Pt also has foley- was placed during surgery, and it has not been removed- nor a voiding trail attempted- we will remove in AM.   Patient transferred to CIR on 03/07/2024 .    Patient currently requires total with basic self-care skills and mod A for functional mobility  secondary to muscle weakness and acute pain, decreased cardiorespiratoy endurance, and decreased sitting balance, decreased standing balance, and decreased balance strategies.  Prior to hospitalization, patient could complete ADLs with min.  Patient will benefit from skilled intervention to decrease level of assist with basic self-care skills and increase independence with basic self-care skills prior to discharge home with care partner.  Anticipate patient will require minimal physical  assistance and follow up home health.  OT - End of Session Activity Tolerance: Tolerates 30+ min activity with multiple rests Endurance Deficit: Yes OT Assessment Rehab Potential (ACUTE ONLY): Good OT Patient demonstrates impairments in the following area(s): Balance;Pain;Safety;Motor;Skin Integrity OT Basic ADL's Functional Problem(s): Bathing;Dressing;Toileting OT Transfers Functional Problem(s): Toilet;Tub/Shower OT Additional Impairment(s): None OT  Plan OT Intensity: Minimum of 1-2 x/day, 45 to 90 minutes OT Frequency: 5 out of 7 days OT Duration/Estimated Length of Stay: 10 days OT Treatment/Interventions: Balance/vestibular training;Discharge planning;Pain management;Self Care/advanced ADL retraining;Therapeutic Activities;Disease mangement/prevention;Functional mobility training;Patient/family education;Skin care/wound managment;Therapeutic Exercise;Community reintegration;DME/adaptive equipment instruction;Neuromuscular re-education;Psychosocial support;UE/LE Strength taining/ROM;Splinting/orthotics OT Self Feeding Anticipated Outcome(s): n/a OT Basic Self-Care Anticipated Outcome(s): min A OT Toileting Anticipated Outcome(s): supervision OT Bathroom Transfers Anticipated Outcome(s): supervision OT Recommendation Patient destination: Home Follow Up Recommendations: Home health OT Equipment Recommended: To be determined   OT Evaluation Precautions/Restrictions  Precautions Precautions: Fall;Back Precaution/Restrictions Comments: LSO, able to recall precautions Required Braces or Orthoses: Spinal Brace Spinal Brace: Lumbar corset;Applied in sitting position Restrictions Weight Bearing Restrictions Per Provider Order: No General Chart Reviewed: Yes    Pain Pain Assessment Pain Score: 0-No pain Home Living/Prior Functioning Home Living Living Arrangements: Spouse/significant other Available Help at Discharge: Family, Available 24 hours/day Type of Home: House Home Access: Stairs to enter [employer] of Steps: 4 Entrance Stairs-Rails: Right, Left (can't reach both, was using cane) Home Layout: One level Bathroom Shower/Tub: Engineer, manufacturing systems: Standard Additional Comments: with tubbench  Lives With: Spouse Prior Function Level of Independence: Independent with gait, Requires assistive device for independence, Independent with transfers (cane for steps, scooter outside the home, furniture  surfing in the home and cane for longer in home distances (35 ft))  Able to Take Stairs?: Yes Driving: Yes Vocation: Retired Administrator, sports Baseline Vision/History: 1 Wears glasses Ability to See in Adequate Light: 0 Adequate Patient Visual Report: No change from baseline Vision Assessment?: No apparent visual deficits Perception  Perception: Within Functional Limits Praxis Praxis: WFL Cognition Cognition Overall Cognitive Status: Within Functional Limits for tasks assessed Arousal/Alertness: Awake/alert Orientation Level: Person;Place;Situation Person: Oriented Place: Oriented Situation: Oriented Memory: Appears intact Attention: Focused;Sustained;Selective Focused Attention: Appears intact Sustained Attention: Appears intact Selective Attention: Appears intact Awareness: Appears intact Problem Solving: Appears intact Safety/Judgment: Appears intact Brief Interview for Mental Status (BIMS) Repetition of Three Words (First Attempt): 3 Temporal Orientation: Year: Correct Temporal Orientation: Month: Accurate within 5 days Temporal Orientation: Day: Correct Recall: "Sock": No, could not recall Recall: "Blue": Yes, no cue required Recall: "Bed": Yes, no cue required BIMS Summary Score: 13 Sensation Sensation Light Touch: Impaired Detail Peripheral sensation comments: patchy sensation loss in L4-S1 dermatome, no paresthesias Light Touch Impaired Details: Impaired LLE;Impaired RLE Hot/Cold: Not tested Proprioception: Appears Intact Stereognosis: Not tested Coordination Gross Motor Movements are Fluid and Coordinated: No Fine Motor Movements are Fluid and Coordinated: Yes Coordination and Movement Description: dyscoordinated/t pain/weakness Motor  Motor Motor - Skilled Clinical Observations: generalized weakness  Trunk/Postural Assessment  Cervical Assessment Cervical Assessment: Within Functional Limits Thoracic Assessment Thoracic Assessment:  (rounded shoulders) Lumbar  Assessment Lumbar Assessment:  (posterior pelvictilt) Postural Control Postural Control: Deficits on evaluation Trunk Control: weakness  Balance Balance Balance Assessed: Yes Static Sitting Balance Static Sitting - Level of Assistance: 6: Modified independent (Device/Increase time) Dynamic Sitting Balance Dynamic Sitting - Level of Assistance: 4: Min assist Static Standing Balance Static Standing - Balance Support: During functional activity Static Standing - Level of Assistance: 3: Mod assist Dynamic Standing Balance Dynamic Standing -  Balance Support: During functional activity Dynamic Standing - Level of Assistance: 4: Min assist Extremity/Trunk Assessment RUE Assessment General Strength Comments: Deltoids 2-/5 due to probable RTC tears; biceps 4+/5; Triceps 4+/5; Grip and FA 4+/5 LUE Assessment General Strength Comments: Deltoids 2-/5 due to probable RTC tears; bicep 5-/5 on L; Triceps  5-/5 on L; Grip  5-/5 on L-  Care Tool Care Tool Self Care   03/08/24 1433  CareTool - Eating  Eating Assist Level Set up assist  CareTool - Oral Care  Oral Care Assist Level Set up assist  CareTool - Bathing  Body parts bathed by helper Right arm;Left upper leg;Left arm;Right lower leg;Chest;Abdomen;Left lower leg;Front perineal area;Buttocks;Right upper leg  Assist Level Total Assistance - Patient < 25%  CareTool- Upper Body Dressing (including orthotics  What is the patient wearing? Pull over shirt  Assist Level Total Assistance - Patient < 25%  CareTool - Lower Body Dressing (excluding footwear)  What is the patient wearing? Underwear/pull up;Pants  Assist for lower body dressing Total Assistance - Patient < 25%  CareTool - Putting on/Taking off footwear  Assist for footwear Moderate Assistance - Patient 50 - 74%             03/08/24 1435   CareTool - Sit to stand transfer  Sit to stand assist level Minimal Assistance - Patient > 75%  CareTool - Chair/bed transfer  Chair/bed  transfer assist level Moderate Assistance - Patient 50 - 74%  CareTool - Toilet Transfers  Assist Level Moderate Assistance - Patient 50 - 74%  CareTool - Hearing  Ability to hear (with hearing aid or hearing appliances if normally used) 0. Adequate - no difficulty in normal conservation, social interaction, listening to TV  CareTool - Expression  Expression of Ideas and Wants 4. Without difficulty (complex and basic) - expresses complex messages without difficulty and with speech that is clear and easy to understand  CareTool - Comprehension  Understanding Verbal and Non-Verbal Content 4. Understands (complex and basic) - clear comprehension without cues or repetitions             Refer to Care Plan for Long Term Goals  SHORT TERM GOAL WEEK 1 OT Short Term Goal 1 (Week 1): LTG=STG  Recommendations for other services: None    Skilled Therapeutic Intervention Ot eval initiated" Pt received dressed in the recliner with LSO already donned. OT eval initiated with OT purpose, role and goals discussed. Pt reported that he received a bath yesterday and declined bathing today and his wife dressed him last night in prep for today. Practiced sit to stands from recliner with RW instead of his home cane to have bilateral support. Pt able to come into standing with mod A with extra time and with his ways of positioning legs prior to standing due to knee pain and lack of ROM. Pt then ambulated with RW 10 feet. Sat to rest and then ambulated again 43 feet with RW with min A at a slow rate. Pt transferred back into recliner with mod A but not in the safest manner using the RW. Encouraged more practiced. Pt left sitting in recliner in prep for lunch.  Did discuss showering here and at home (in his tub shower with bench).   ADL  Declined to perform- performed last night with wife with total A. Mobility  Transfers Sit to Stand: Minimal Assistance - Patient > 75% Stand to Sit: Minimal Assistance - Patient  > 75%   Discharge Criteria: Patient  will be discharged from OT if patient refuses treatment 3 consecutive times without medical reason, if treatment goals not met, if there is a change in medical status, if patient makes no progress towards goals or if patient is discharged from hospital.  The above assessment, treatment plan, treatment alternatives and goals were discussed and mutually agreed upon: by patient  Oran Bimler 03/08/2024, 12:01 PM

## 2024-03-08 NOTE — Plan of Care (Signed)
  Problem: RH Balance Goal: LTG Patient will maintain dynamic standing with ADLs (OT) Description: LTG:  Patient will maintain dynamic standing balance with assist during activities of daily living (OT)  Flowsheets (Taken 03/08/2024 1329) LTG: Pt will maintain dynamic standing balance during ADLs with: Supervision/Verbal cueing   Problem: Sit to Stand Goal: LTG:  Patient will perform sit to stand in prep for activites of daily living with assistance level (OT) Description: LTG:  Patient will perform sit to stand in prep for activites of daily living with assistance level (OT) Flowsheets (Taken 03/08/2024 1329) LTG: PT will perform sit to stand in prep for activites of daily living with assistance level: Contact Guard/Touching assist   Problem: RH Bathing Goal: LTG Patient will bathe all body parts with assist levels (OT) Description: LTG: Patient will bathe all body parts with assist levels (OT) Flowsheets (Taken 03/08/2024 1329) LTG: Pt will perform bathing with assistance level/cueing: Minimal Assistance - Patient > 75%   Problem: RH Dressing Goal: LTG Patient will perform upper body dressing (OT) Description: LTG Patient will perform upper body dressing with assist, with/without cues (OT). Flowsheets (Taken 03/08/2024 1329) LTG: Pt will perform upper body dressing with assistance level of:  Including orthosis  Set up assist Goal: LTG Patient will perform lower body dressing w/assist (OT) Description: LTG: Patient will perform lower body dressing with assist, with/without cues in positioning using equipment (OT) Flowsheets (Taken 03/08/2024 1329) LTG: Pt will perform lower body dressing with assistance level of: Supervision/Verbal cueing   Problem: RH Toileting Goal: LTG Patient will perform toileting task (3/3 steps) with assistance level (OT) Description: LTG: Patient will perform toileting task (3/3 steps) with assistance level (OT)  Flowsheets (Taken 03/08/2024 1329) LTG: Pt will perform  toileting task (3/3 steps) with assistance level: Moderate Assistance - Patient 50 - 74%   Problem: RH Toilet Transfers Goal: LTG Patient will perform toilet transfers w/assist (OT) Description: LTG: Patient will perform toilet transfers with assist, with/without cues using equipment (OT) Flowsheets (Taken 03/08/2024 1329) LTG: Pt will perform toilet transfers with assistance level of: Supervision/Verbal cueing   Problem: RH Tub/Shower Transfers Goal: LTG Patient will perform tub/shower transfers w/assist (OT) Description: LTG: Patient will perform tub/shower transfers with assist, with/without cues using equipment (OT) Flowsheets (Taken 03/08/2024 1329) LTG: Pt will perform tub/shower stall transfers with assistance level of: Minimal Assistance - Patient > 75%

## 2024-03-08 NOTE — Progress Notes (Signed)
 [facility]patient [facility] Individual Statement of Services  Patient Name:  Christian [name]  Date:  03/08/2024  Welcome to the [facility]patient [facility].  Our goal is to provide you with an individualized program based on your diagnosis and situation, designed to meet your specific needs.  With this comprehensive rehabilitation program, you will be expected to participate in at least 3 hours of rehabilitation therapies Monday-Friday, with modified therapy programming on the weekends.  Your rehabilitation program will include the following services:  Physical Therapy (PT), Occupational Therapy (OT), 24 hour per day rehabilitation nursing, Care Coordinator, Rehabilitation Medicine, Nutrition Services, and Pharmacy Services  Weekly team conferences will be held on Tuesday to discuss your progress.  Your Inpatient Rehabilitation Care Coordinator will talk with you frequently to get your input and to update you on team discussions.  Team conferences with you and your family in attendance may also be held.  Expected length of stay: 10-12 days  Overall anticipated outcome: min-CGA-OT and supervision with cues-PT  Depending on your progress and recovery, your program may change. Your Inpatient Rehabilitation Care Coordinator will coordinate services and will keep you informed of any changes. Your Inpatient Rehabilitation Care Coordinator's name and contact numbers are listed  below.  The following services may also be recommended but are not provided by the Inpatient Rehabilitation Center:  Driving Evaluations Home Health Rehabiltiation Services Outpatient Rehabilitation Services    Arrangements will be made to provide these services after discharge if needed.  Arrangements include referral to agencies that provide these services.  Your insurance has been verified to be:  Medicare & BCBS Your primary doctor is:  Annemarie Kil  Pertinent information will be shared with your doctor  and your insurance company.  Inpatient Rehabilitation Care Coordinator:  Adrianna Albee, [place] [phone] or Justine Oms  Information discussed with and copy given to patient by: Mardell Shade, 03/08/2024, 10:22 AM

## 2024-03-08 NOTE — Evaluation (Signed)
 Physical Therapy Assessment and Plan  Patient Details  Name: Christian [name] MRN: [id number] Date of Birth: 10/16/1951  PT Diagnosis: Difficulty walking, Edema, Impaired sensation, Low back pain, Muscle weakness, and Pain in joint Rehab Potential: Good ELOS: 10-12 days   Today's Date: 03/08/2024 PT Individual Time: 0845-1000 PT Individual Time Calculation (min): 75 min    Hospital Problem: Principal Problem:   Neurogenic claudication due to lumbar spinal stenosis   Past Medical History:  Past Medical History:  Diagnosis Date   Arthritis of left knee    Chronic back pain 09/13/2013   Chronic pain syndrome 09/13/2013   Diabetic ulcer of right ankle (HCC)    GERD (gastroesophageal reflux disease)    hx. of   Gout 09/13/2013   Hemorrhoids    Hypertension    Morbid obesity with body mass index of 45.0-49.9 in adult [facility]) 09/13/2013   Osteoarthritis 09/13/2013   Pneumonia    hx. of   Seizures (HCC)    Sleep apnea    has never used C-pap machine due to insurance cost   Type 1 diabetes mellitus, uncontrolled    Past Surgical History:  Past Surgical History:  Procedure Laterality Date   broken toe 35 years ago     COLONOSCOPY WITH PROPOFOL  N/A 12/15/2022   Procedure: COLONOSCOPY WITH PROPOFOL ;  Surgeon: Luke Salaam, MD;  Location: [facility] ENDOSCOPY;  Service: Gastroenterology;  Laterality: N/A;   IR EMBO ARTERIAL NOT HEMORR HEMANG INC GUIDE ROADMAPPING  04/27/2023   IR RADIOLOGIST EVAL & MGMT  03/29/2023   IR RADIOLOGIST EVAL & MGMT  08/02/2023   TOTAL KNEE ARTHROPLASTY Right 05/01/2015   Procedure: RIGHT TOTAL KNEE ARTHROPLASTY;  Surgeon: Arnie Lao, MD;  Location: [facility] ORS;  Service: Orthopedics;  Laterality: Right;   WISDOM TOOTH EXTRACTION      Assessment & Plan Clinical Impression: Christian [name] is a 73 year old R handed  male with history of T2DM with peripheral neuropathy, HTN, morbid obesity with BMI-46, OA bilateral knees- Left> right, , left shoulder pain  due to impingement, And RTC injuries, lumbar stenosis with radiculopathy RLE and neurogenic claudication who elected to undergo L4-S1 laminectomy with trans-foraminal interbody fusion by Dr. Faylene Hoots at [facility] on 03/04/24. Post op reported to have improvement in radiculopathy but continues to be limited by pain and weakness. Hemovac removed on 04/29 and to wear LSO when out of bed. Post op with ABLA and rise in WBC felt to be reactive in nature. He was    He was independent prior to admission but has been limited to bed due to pain for 6 weeks PTA. He was able to furniture walk or use cane for short distances and needed assistance to get in tub/tub seat. Uses electric scooter when out of home.     Pt also has foley- was placed during surgery, and it has not been removed- nor a voiding trail attempted- we will remove in AM.   Patient transferred to CIR on 03/07/2024 .   Patient currently requires mod with mobility secondary to muscle weakness, decreased cardiorespiratoy endurance, impaired timing and sequencing and decreased coordination, and decreased standing balance, decreased postural control, decreased balance strategies, and difficulty maintaining precautions.  Prior to hospitalization, patient was modified independent  with mobility and lived with Spouse in a House home.  Home access is 4Stairs to enter.  Patient will benefit from skilled PT intervention to maximize safe functional mobility, minimize fall risk, and decrease caregiver burden for  planned discharge home with 24 hour supervision.  Anticipate patient will benefit from follow up HH at discharge.  PT - End of Session Activity Tolerance: Tolerates 10 - 20 min activity with multiple rests Endurance Deficit: Yes PT Assessment Rehab Potential (ACUTE/IP ONLY): Good PT Barriers to Discharge: Inaccessible home environment;Home environment access/layout;Decreased caregiver support;Weight PT Patient demonstrates  impairments in the following area(s): Balance;Skin Integrity;Pain;Safety;Motor;Sensory;Endurance PT Transfers Functional Problem(s): Bed Mobility;Bed to Chair;Car PT Locomotion Functional Problem(s): Ambulation;Wheelchair Mobility;Stairs PT Plan PT Intensity: Minimum of 1-2 x/day ,45 to 90 minutes PT Frequency: 5 out of 7 days PT Duration Estimated Length of Stay: 10-12 days PT Treatment/Interventions: Ambulation/gait training;Cognitive remediation/compensation;Discharge planning;DME/adaptive equipment instruction;Functional mobility training;Pain management;Psychosocial support;Splinting/orthotics;Therapeutic Activities;UE/LE Strength taining/ROM;Visual/perceptual remediation/compensation;Wheelchair propulsion/positioning;UE/LE Coordination activities;Therapeutic Exercise;Stair training;Skin care/wound management;Patient/family education;Neuromuscular re-education;Functional electrical stimulation;Disease management/prevention;Community reintegration;Balance/vestibular training PT Transfers Anticipated Outcome(s): supervision with LRAD PT Locomotion Anticipated Outcome(s): supervision with LRAD 150 ft PT Recommendation Follow Up Recommendations: Home health PT Patient destination: Home Equipment Recommended: To be determined   PT Evaluation Precautions/Restrictions   General   Vital Signs  Pain Pain Assessment Pain Scale: 0-10 Pain Score: 4  Pain Location: Back Pain Intervention(s): Medication (See eMAR) Pain Interference   Home Living/Prior Functioning Home Living Living Arrangements: Spouse/significant other Additional Comments: with tubbench Vision/Perception  Vision - History Ability to See in Adequate Light: 0 Adequate Perception Perception: Within Functional Limits Praxis Praxis: WFL  Cognition Overall Cognitive Status: Within Functional Limits for tasks assessed Arousal/Alertness: Awake/alert Attention: Focused;Sustained;Selective Focused Attention: Appears  intact Sustained Attention: Appears intact Selective Attention: Appears intact Memory: Appears intact Awareness: Appears intact Problem Solving: Appears intact Safety/Judgment: Appears intact Sensation Sensation Light Touch: Impaired Detail Peripheral sensation comments: patchy sensation loss in L4-S1 dermatome, no paresthesias Light Touch Impaired Details: Impaired LLE;Impaired RLE Hot/Cold: Not tested Proprioception: Appears Intact Stereognosis: Not tested Coordination Gross Motor Movements are Fluid and Coordinated: No Fine Motor Movements are Fluid and Coordinated: Yes Coordination and Movement Description: dyscoordinated/t pain/weakness Motor  Motor Motor - Skilled Clinical Observations: generalized weakness   Trunk/Postural Assessment  Cervical Assessment Cervical Assessment: Within Functional Limits Thoracic Assessment Thoracic Assessment: Exceptions to [facility] (back precautions) Lumbar Assessment Lumbar Assessment: Exceptions to [facility] (back precautions) Postural Control Postural Control: Deficits on evaluation Trunk Control: weakness  Balance Balance Balance Assessed: Yes Static Sitting Balance Static Sitting - Balance Support: Feet supported Static Sitting - Level of Assistance: 6: Modified independent (Device/Increase time) Dynamic Sitting Balance Dynamic Sitting - Balance Support: Feet supported Dynamic Sitting - Level of Assistance: 4: Min Oncologist Standing - Balance Support: During functional activity Static Standing - Level of Assistance: 4: Min assist Dynamic Standing Balance Dynamic Standing - Balance Support: During functional activity Dynamic Standing - Level of Assistance: 3: Mod assist Extremity Assessment      RLE Assessment RLE Assessment: Exceptions to [facility] General Strength Comments: Hip flexion 3+/10 d/t pain, rest of LE grossly 5/5  at bed level LLE Assessment LLE Assessment: Exceptions to [facility] Passive Range of  Motion (PROM) Comments: limited by pain, lacking 30 deg extension General Strength Comments: hip flexion and knee extension 2+/5 limited by pain, remaining muscle groups 5/5  Care Tool Care Tool Bed Mobility Roll left and right activity   Roll left and right assist level: Supervision/Verbal cueing    Sit to lying activity   Sit to lying assist level: Contact Guard/Touching assist    Lying to sitting on side of bed activity   Lying to sitting on side of bed assist  level: the ability to move from lying on the back to sitting on the side of the bed with no back support.: Contact Guard/Touching assist     Care Tool Transfers Sit to stand transfer   Sit to stand assist level: Moderate Assistance - Patient 50 - 74%    Chair/bed transfer   Chair/bed transfer assist level: Moderate Assistance - Patient 50 - 74%    Car transfer Car transfer activity did not occur: Safety/medical concerns        Care Tool Locomotion Ambulation Ambulation activity did not occur: Safety/medical concerns        Walk 10 feet activity Walk 10 feet activity did not occur: Safety/medical concerns       Walk 50 feet with 2 turns activity Walk 50 feet with 2 turns activity did not occur: Safety/medical concerns      Walk 150 feet activity Walk 150 feet activity did not occur: Safety/medical concerns      Walk 10 feet on uneven surfaces activity Walk 10 feet on uneven surfaces activity did not occur: Safety/medical concerns      Stairs Stair activity did not occur: Safety/medical concerns        Walk up/down 1 step activity Walk up/down 1 step or curb (drop down) activity did not occur: Safety/medical concerns      Walk up/down 4 steps activity Walk up/down 4 steps activity did not occur: Safety/medical concerns      Walk up/down 12 steps activity Walk up/down 12 steps activity did not occur: Safety/medical concerns      Pick up small objects from floor Pick up small object from the floor (from  standing position) activity did not occur: Safety/medical concerns      Wheelchair Is the patient using a wheelchair?: Yes Type of Wheelchair: Manual Wheelchair activity did not occur: Safety/medical concerns      Wheel 50 feet with 2 turns activity Wheelchair 50 feet with 2 turns activity did not occur: Safety/medical concerns    Wheel 150 feet activity Wheelchair 150 feet activity did not occur: Safety/medical concerns      Refer to Care Plan for Long Term Goals  SHORT TERM GOAL WEEK 1 PT Short Term Goal 1 (Week 1): pt will ambulate 100 ft with LRAD PT Short Term Goal 2 (Week 1): Pt will perform STS with CGA PT Short Term Goal 3 (Week 1): Pt will maintain back precautions without cue throughout sessions.  Recommendations for other services: None   Skilled Therapeutic Intervention Evaluation completed (see details above) with patient education regarding purpose of PT evaluation, PT POC and goals, therapy schedule, weekly team meetings, and other CIR information including safety plan and fall risk safety. No s/s of increased pain with mobility. Pt able to perform Stand pivot transfer with personal cane and HHA, mod a, +2 for safety. Sit to stand in the same manner from recliner with min a. Pt performed the below functional mobility tasks with the specified levels of skilled cuing and assistance.  Pt remained in recliner at end of session, was left with all needs in reach and alarm active.   Mobility Bed Mobility Bed Mobility: Supine to Sit Supine to Sit: Contact Guard/Touching assist Transfers Transfers: Sit to Stand;Stand to Sit;Stand Pivot Transfers Sit to Stand: Minimal Assistance - Patient > 75% Stand to Sit: Moderate Assistance - Patient 50-74% Stand Pivot Transfers: Minimal Assistance - Patient > 75% Stand Pivot Transfer Details: Verbal cues for safe use of DME/AE;Verbal cues for precautions/safety Transfer (  Assistive device): Straight cane Locomotion  Gait Ambulation:  No Gait Gait: No Stairs / Additional Locomotion Stairs: No Wheelchair Mobility Wheelchair Mobility: No   Discharge Criteria: Patient will be discharged from PT if patient refuses treatment 3 consecutive times without medical reason, if treatment goals not met, if there is a change in medical status, if patient makes no progress towards goals or if patient is discharged from hospital.  The above assessment, treatment plan, treatment alternatives and goals were discussed and mutually agreed upon: by patient  Tex Filbert 03/08/2024, 12:54 PM

## 2024-03-08 NOTE — Progress Notes (Signed)
 Physical Therapy Session Note  Patient Details  Name: Christian [name] MRN: [id number] Date of Birth: Nov 19, 1950  Today's Date: 03/08/2024 PT Individual Time: 1345-1445 PT Individual Time Calculation (min): 60 min   Short Term Goals: Week 1:  PT Short Term Goal 1 (Week 1): pt will ambulate 100 ft with LRAD PT Short Term Goal 2 (Week 1): Pt will perform STS with CGA PT Short Term Goal 3 (Week 1): Pt will maintain back precautions without cue throughout sessions.  Skilled Therapeutic Interventions/Progress Updates:  pt received in recliner and agreeable to therapy. Pt reports 5/10 back pain, nsg provided pain medication during session, rest and positioning as needed. Provided pt with bari RW for improved comfort and better fit.   Extra time and several attempts to stand from recliner d/t increased pain and stiffness from sitting in 1 position. Provided education on movement to prevent stiffness and changing position to manage pain.  ambulatory transfer with bari RW and CGA. Min a Sit to stand from recliner and w/c throughout session. Pt propelled w/c with BUE 100 ft and transported remaining distance d/t increased shoulder pain.   Required multiple attempts to stand at stairs but pt able with light min a. Pt performed x 4 3" steps with CGA and step to pattern with both rails. Descended with step to and lateral technique using L rail. Pt noted to sit using twisting motion to get his hips into chair. Provided education on back precautions and need to move "in a straight line" despite his typical compensation patterns.   Returned to room and pt performed Stand pivot transfer to toilet with CGA and grab bar. Extended time required for toileting, so pt handed off to NT for completion at end of session.    Therapy Documentation Precautions:  Precautions Precautions: Fall, Back Precaution/Restrictions Comments: LSO, able to recall precautions Required Braces or Orthoses: Spinal Brace Spinal  Brace: Lumbar corset, Applied in sitting position Restrictions Weight Bearing Restrictions Per Provider Order: No General:       Therapy/Group: Individual Therapy  Tex Filbert 03/08/2024, 2:40 PM

## 2024-03-08 NOTE — Progress Notes (Addendum)
 Nutrition Education Note  RD consulted for nutrition education regarding morbid obesity.   Lab Results  Component Value Date   HGBA1C 6.0 (H) 11/28/2023    He has a hx of diabetes and says it is well controlled at home. He eats 3 meals daily; monitors carbohydrate intake and portion sizes. RD provided "Carbohydrate Counting for People with Diabetes" handout from the Academy of Nutrition and Dietetics. Discussed different food groups and their effects on blood sugar, emphasizing carbohydrate-containing foods. Provided list of carbohydrates and recommended serving sizes of common foods.  Discussed importance of controlled and consistent carbohydrate intake throughout the day. Provided examples of ways to balance meals/snacks and encouraged intake of high-fiber, whole grain complex carbohydrates. Teach back method used.  Expect good compliance.  Body mass index is 48.46 kg/m. Pt meets criteria for morbid obesity based on current BMI.  Current diet order is carb modified, patient is consuming approximately 100% of meals at this time. Labs and medications reviewed. No further nutrition interventions warranted at this time. RD contact information provided. If additional nutrition issues arise, please re-consult RD. Recommend outpatient follow-up for weight loss counseling; RD to order.    Barnet Boots RD, LDN, CNSC Contact via secure chat. If unavailable, use group chat "RD Inpatient."

## 2024-03-08 NOTE — Progress Notes (Signed)
 Inpatient Rehabilitation  Patient information reviewed and entered into eRehab system by Feliberto Gottron, M.A., CCC-SLP, Rehab Quality Coordinator.  Information including medical coding, functional ability and quality indicators will be reviewed and updated through discharge.

## 2024-03-08 NOTE — Progress Notes (Incomplete)
   03/08/24 1435  CareTool - Sit to stand transfer  Sit to stand assist level Minimal Assistance - Patient > 75%  CareTool - Chair/bed transfer  Chair/bed transfer assist level Moderate Assistance - Patient 50 - 74%  CareTool - Toilet Transfers  Assist Level Moderate Assistance - Patient 50 - 74%  CareTool - Hearing  Ability to hear (with hearing aid or hearing appliances if normally used) 0. Adequate - no difficulty in normal conservation, social interaction, listening to TV  CareTool - Expression  Expression of Ideas and Wants 4. Without difficulty (complex and basic) - expresses complex messages without difficulty and with speech that is clear and easy to understand  CareTool - Comprehension  Understanding Verbal and Non-Verbal Content 4. Understands (complex and basic) - clear comprehension without cues or repetitions

## 2024-03-08 NOTE — Progress Notes (Signed)
 Inpatient Rehabilitation Care Coordinator Assessment and Plan Patient Details  Name: Christian [name] MRN: [id number] Date of Birth: [birth year]  Today's Date: 03/08/2024  Hospital Problems: Principal Problem:   Neurogenic claudication due to lumbar spinal stenosis  Past Medical History:  Past Medical History:  Diagnosis Date   Arthritis of left knee    Chronic back pain 09/13/2013   Chronic pain syndrome 09/13/2013   Diabetic ulcer of right ankle (HCC)    GERD (gastroesophageal reflux disease)    hx. of   Gout 09/13/2013   Hemorrhoids    Hypertension    Morbid obesity with body mass index of 45.0-49.9 in adult [facility]) 09/13/2013   Osteoarthritis 09/13/2013   Pneumonia    hx. of   Seizures (HCC)    Sleep apnea    has never used C-pap machine due to insurance cost   Type 1 diabetes mellitus, uncontrolled    Past Surgical History:  Past Surgical History:  Procedure Laterality Date   broken toe 35 years ago     COLONOSCOPY WITH PROPOFOL  N/A 12/15/2022   Procedure: COLONOSCOPY WITH PROPOFOL ;  Surgeon: Luke Salaam, MD;  Location: [facility] ENDOSCOPY;  Service: Gastroenterology;  Laterality: N/A;   IR EMBO ARTERIAL NOT HEMORR HEMANG INC GUIDE ROADMAPPING  04/27/2023   IR RADIOLOGIST EVAL & MGMT  03/29/2023   IR RADIOLOGIST EVAL & MGMT  08/02/2023   TOTAL KNEE ARTHROPLASTY Right 05/01/2015   Procedure: RIGHT TOTAL KNEE ARTHROPLASTY;  Surgeon: Arnie Lao, MD;  Location: [facility] ORS;  Service: Orthopedics;  Laterality: Right;   WISDOM TOOTH EXTRACTION     Social History:  reports that he quit smoking about 20 years ago. His smoking use included cigarettes. He started smoking about 62 years ago. He has a 42.2 pack-year smoking history. He has never used smokeless tobacco. He reports that he does not drink alcohol and does not use drugs.  Family / Support Systems Marital Status: Married Patient Roles: Spouse, Parent Spouse/Significant Other: [name] [phone] Children: Joel Murphy  [phone] Other Supports: Another son who is local and supportive, also has friends who are supportive Anticipated Caregiver: Wife Ability/Limitations of Caregiver: wife has been assisting with husband's care prior to admission Caregiver Availability: 24/7 Family Dynamics: Close knit with two son's and daughter in-law's who check on and will assist if needed. Wife is his primary caregiver and has been assisting prior to admission  Social History Preferred language: English Religion: Non-Denominational Cultural Background: NA Education: HS Health Literacy - How often do you need to have someone help you when you read instructions, pamphlets, or other written material from your doctor or pharmacy?: Never Writes: Yes Employment Status: Retired Marine scientist Issues: NA Guardian/Conservator: None-according to MD pt is capable of making his own decisions while here   Abuse/Neglect Abuse/Neglect Assessment Can Be Completed: Yes Physical Abuse: Denies Verbal Abuse: Denies Sexual Abuse: Denies Exploitation of patient/patient's resources: Denies Self-Neglect: Denies  Patient response to: Social Isolation - How often do you feel lonely or isolated from those around you?: Rarely  Emotional Status Pt's affect, behavior and adjustment status: Pt is motivated to do well and get a jump start on his recovery. He needed assist due to back prior to surgery with bathing and dressing. He is hopeful he will do well and regain his independence. Recent Psychosocial Issues: other health issues Psychiatric History: No history seems to be coping appropriately and optimistic regarding his recovery. Substance Abuse History: NA  Patient / Family Perceptions, Expectations & Goals Pt/Family understanding  of illness & functional limitations: Pt is able to explain his back surgery and aware of his precautions. He does talk with the MD daily and feels understands his plan moving forward. Premorbid  pt/family roles/activities: husband, father, retiree, freind, etc Anticipated changes in roles/activities/participation: resume Pt/family expectations/goals: Pt states: " I hope to be able to do for myself when I leave here or not have to ask my wife to do too much for me."  [employer]: None Premorbid Home Care/DME Agencies: Other (Comment) (cane) Transportation available at discharge: wife Is the patient able to respond to transportation needs?: Yes In the past 12 months, has lack of transportation kept you from medical appointments or from getting medications?: No In the past 12 months, has lack of transportation kept you from meetings, work, or from getting things needed for daily living?: No  Discharge Planning Living Arrangements: Spouse/significant other Support Systems: Spouse/significant other, Children, Other relatives, Friends/neighbors Type of Residence: Private residence Insurance Resources: Electrical engineer Resources: Tree surgeon, Family Support Financial Screen Referred: No Money Management: Patient, Spouse Does the patient have any problems obtaining your medications?: No Home Management: wife Patient/Family Preliminary Plans: Return home with wife who has been providing care to him prior to admission. He his hopeful he will do well here and recover from his back surgery. Aware being evaluated today and goals being set or stay here Care Coordinator Anticipated Follow Up Needs: HH/OP  Clinical Impression Pleasant gentleman who is motivated to recover from his back surgery and get back home His wife is involved in his care and will assist if needed. Will await therapy team's evaluations and work on discharge needs.  Mardell Shade 03/08/2024, 10:21 AM

## 2024-03-09 DIAGNOSIS — M48062 Spinal stenosis, lumbar region with neurogenic claudication: Secondary | ICD-10-CM | POA: Diagnosis not present

## 2024-03-09 DIAGNOSIS — R739 Hyperglycemia, unspecified: Secondary | ICD-10-CM

## 2024-03-09 DIAGNOSIS — R000 Tachycardia, unspecified: Secondary | ICD-10-CM | POA: Diagnosis not present

## 2024-03-09 LAB — GLUCOSE, CAPILLARY
Glucose-Capillary: 193 mg/dL — ABNORMAL HIGH (ref 70–99)
Glucose-Capillary: 201 mg/dL — ABNORMAL HIGH (ref 70–99)
Glucose-Capillary: 191 mg/dL — ABNORMAL HIGH (ref 70–99)
Glucose-Capillary: 214 mg/dL — ABNORMAL HIGH (ref 70–99)

## 2024-03-09 NOTE — Progress Notes (Signed)
 PROGRESS NOTE   Subjective/Complaints:  Pt doing well, slept ok, pain managed overall, LBM this morning, urinating fine. Denies any other complaints or concerns today.   ROS: as per HPI. Denies CP, SOB, abd pain, N/V/D/C, or any other complaints at this time.    Objective:   No results found. Recent Labs    03/08/24 0633  WBC 10.4  HGB 10.7*  HCT 32.2*  PLT 199   Recent Labs    03/08/24 0633  NA 135  K 3.8  CL 104  CO2 25  GLUCOSE 190*  BUN 11  CREATININE 0.66  CALCIUM 8.3*    Intake/Output Summary (Last 24 hours) at 03/09/2024 4540 Last data filed at 03/09/2024 0240 Gross per 24 hour  Intake 660 ml  Output 300 ml  Net 360 ml        Physical Exam: Vital Signs Blood pressure 106/86, pulse (!) 106, temperature 98.2 F (36.8 C), temperature source Oral, resp. rate 18, height 5\' 6"  (1.676 m), weight (!) 136.2 kg, SpO2 94%.   General: awake, alert, appropriate, sitting up in bed; NAD HENT: conjugate gaze; oropharynx moist CV: regular rhythm, mildly tachycardic  rate; no JVD Pulmonary: CTA B/L; no W/R/R- good air movement GI: soft, NT, ND, (+)BS- protuberant- BMI 48 Psychiatric: appropriate- interactive Neurological: Ox3  PRIOR EXAMS: Musculoskeletal:     Cervical back: Neck supple. No tenderness.     Right lower leg: No edema.     Comments: Deltoids 2-/5 due to probable RTC tears; biceps 4+/5 on R 5-/5 on L; Triceps 4+/5 on R and 5-/5 on L; Grip and FA 4+/5 on R and 5-/5 on L- but very limited by having to do hand walking up body for shoulders LE's- RLE- HF 4-/5; KE 4+/5; DF/PF 5-/5 LLE- HF 2-/5; cannot extend or flex L knee due to "bad L knee"; 4+/5 DF and PF Literally cannot extend L knee past 45 degrees, but says can when standing with assistive device  Skin:    General: Skin is warm and dry.     Comments: Incision with some sanguinous drainage- tegaderm placed with dressing underneath it   Neurological:     Mental Status: He is alert and oriented to person, place, and time.     Comments: Decreased to light touch B/L at midcalf- worse on RLE to light touch and pinprick- absent to pinprick on bottom of feet   Assessment/Plan: 1. Functional deficits which require 3+ hours per day of interdisciplinary therapy in a comprehensive inpatient rehab setting. Physiatrist is providing close team supervision and 24 hour management of active medical problems listed below. Physiatrist and rehab team continue to assess barriers to discharge/monitor patient progress toward functional and medical goals  Care Tool:  Bathing        Body parts bathed by helper: Right arm, Left upper leg, Left arm, Right lower leg, Chest, Abdomen, Left lower leg, Front perineal area, Buttocks, Right upper leg     Bathing assist Assist Level: Total Assistance - Patient < 25%     Upper Body Dressing/Undressing Upper body dressing   What is the patient wearing?: Pull over shirt    Upper body assist  Assist Level: Total Assistance - Patient < 25%    Lower Body Dressing/Undressing Lower body dressing      What is the patient wearing?: Underwear/pull up, Pants     Lower body assist Assist for lower body dressing: Total Assistance - Patient < 25%     Toileting Toileting Toileting Activity did not occur (Clothing management and hygiene only): N/A (no void or bm)  Toileting assist Assist for toileting: Moderate Assistance - Patient 50 - 74%     Transfers Chair/bed transfer  Transfers assist     Chair/bed transfer assist level: Moderate Assistance - Patient 50 - 74%     Locomotion Ambulation   Ambulation assist   Ambulation activity did not occur: Safety/medical concerns          Walk 10 feet activity   Assist  Walk 10 feet activity did not occur: Safety/medical concerns        Walk 50 feet activity   Assist Walk 50 feet with 2 turns activity did not occur: Safety/medical  concerns         Walk 150 feet activity   Assist Walk 150 feet activity did not occur: Safety/medical concerns         Walk 10 feet on uneven surface  activity   Assist Walk 10 feet on uneven surfaces activity did not occur: Safety/medical concerns         Wheelchair     Assist Is the patient using a wheelchair?: Yes Type of Wheelchair: Manual Wheelchair activity did not occur: Safety/medical concerns         Wheelchair 50 feet with 2 turns activity    Assist    Wheelchair 50 feet with 2 turns activity did not occur: Safety/medical concerns       Wheelchair 150 feet activity     Assist  Wheelchair 150 feet activity did not occur: Safety/medical concerns       Blood pressure 106/86, pulse (!) 106, temperature 98.2 F (36.8 C), temperature source Oral, resp. rate 18, height 5\' 6"  (1.676 m), weight (!) 136.2 kg, SpO2 94%.  Medical Problem List and Plan: 1. Functional deficits secondary to severe lumbar stenosis with B/L LE radiculopathy s/p PSF at L4-S1             -patient may  shower -ELOS/Goals: 10-12 days min A- was using scooter outside home prior- had been bedbound for 6 weeks prior to admission            -Con't CIR PT and OT 2.  Antithrombotics: -DVT/anticoagulation:  Mechanical: Sequential compression devices, entire leg Bilateral lower extremities -will need to check with surgeon at 7 days s/p surgery when can start DVT prophylaxis 5/2- will call Surgeon Monday to get ok to put on Lovenox             -antiplatelet therapy: ASA 81mg  daily 3. Pain Management:  tylenol  650mg  QID. Voltaren  gel QID. Cymbalta  60mg  daily. Hydrocodone  ineffective--will change to oxycodone  which he has tried in the past. 4. Mood/Behavior/Sleep: LCSW to follow for evaluation and support.             -antipsychotic agents: N/A 5. Neuropsych/cognition: This patient is capable of making decisions on his own behalf. 6. Skin/Wound Care: Routine pressure relief  measures.              --monitor drainage from wound. Dry dressing daily.  7. Fluids/Electrolytes/Nutrition: Monitor I/O and routine labs  8. T2DM w/neuropathy: Hgb A1c-5.9 and better controlled. On  Novolin 100 units am/30 units pm prn BS>190 and Metformin  2000 mg daily am.  --Monitor BS ac/hs. Followed by Dr. Marianna Shirk --Currently on NPH 10 units bid w/novolog  6 units ac TID/HS?             --will d/c hs coverage and use SSI for now             --Resume metformin  in am and d/c NPH to help transition to home regimen. -Can use Libre to monitor CBGs  5/2- will get pt back on some of home dose and titrate over-  -03/09/24 CBGs pretty good, cont regimen as is for now.  CBG (last 3)  Recent Labs    03/08/24 1709 03/08/24 2108 03/09/24 0619  GLUCAP 168* 173* 193*    9.  ABLA: Has had drop in Hgb from 14.0 to 11.2 due to post op anemia             --monitor H/H for trend. Monitor for signs of bleeding. 5/2- Hb 10.7- will monitor for bleeding- incision shows scant drainage, but not enough to explain drop- likely due to not being checked for a few days   10. Leucocytosis: WBC up to 13.3--likely reactive. Monitor for fevers and other signs of infection  5/2- WBC down to 10.4k- monitor clinically             --monitor drainage.  11. Hx of  DOE:  Echo 02/27/24 showed Moderate to severe concentric hypertrophy with EF 45-50% and mild LV hypokinesis. --hx of mild asymptomatic tachycardia per PCP notes. Cont metoprolol  XL 25mg  daily.  12. 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 Rozelle Corning 15. Morbid obesity-BMI 48.46: RD consult for dietary education.  --Encourage/educate on importance of diet and wt loss to help promote health and mobility.  16. Urinary retention- came to use with Foley catheter- will remove in AM  and do bladder scans to make sure is emptying-cath if volumes >350cc with coude' 5/2- removed foley this AM- hasn't been 6 hours yet, but in 1-2 hours, will need to be cathed if hasn't voided -03/09/24 low PVRs overnight, monitor. Continue flomax  0.4mg  nightly 17. Constipation- LBM 5 days ago- pt refuses Sorbitol  because scared of diarrhea/accidents. Continue miralax  BID and senokot 2 tabs qAM 5/2- no BM yet- if no BM by tomorrow- needs to get cleaned out some way over weekend -03/09/24 LBM this morning, large type 4; monitor  18. GI PPx: Protonix  20mg  daily       LOS: 2 days A FACE TO FACE EVALUATION WAS PERFORMED  [id number] 03/09/2024, 8:12 AM

## 2024-03-09 NOTE — Plan of Care (Signed)
  Problem: Consults Goal: RH GENERAL PATIENT EDUCATION Description: See Patient Education module for education specifics. Outcome: Progressing   Problem: RH BOWEL ELIMINATION Goal: RH STG MANAGE BOWEL WITH ASSISTANCE Description: STG Manage Bowel with  minimal Assistance. Outcome: Progressing   Problem: RH BLADDER ELIMINATION Goal: RH STG MANAGE BLADDER WITH ASSISTANCE Description: STG Manage Bladder With minimal Assistance Outcome: Progressing   Problem: RH SKIN INTEGRITY Goal: RH STG SKIN FREE OF INFECTION/BREAKDOWN Description: Manage skin free of infection/breakdown with minimal assistance Outcome: Progressing   Problem: RH SAFETY Goal: RH STG ADHERE TO SAFETY PRECAUTIONS W/ASSISTANCE/DEVICE Description: STG Adhere to Safety Precautions With minimal Assistance/Device. Outcome: Progressing   Problem: RH PAIN MANAGEMENT Goal: RH STG PAIN MANAGED AT OR BELOW PT'S PAIN GOAL Description: <4 w/ prns Outcome: Progressing   Problem: RH KNOWLEDGE DEFICIT GENERAL Goal: RH STG INCREASE KNOWLEDGE OF SELF CARE AFTER HOSPITALIZATION Description: Manage increase knowledge of self care after hospitalization with minimal assistance from spouse using educational materials provided Outcome: Progressing   Problem: Education: Goal: Ability to describe self-care measures that may prevent or decrease complications (Diabetes Survival Skills Education) will improve Outcome: Progressing Goal: Individualized Educational Video(s) Outcome: Progressing   Problem: Coping: Goal: Ability to adjust to condition or change in health will improve Outcome: Progressing   Problem: Fluid Volume: Goal: Ability to maintain a balanced intake and output will improve Outcome: Progressing   Problem: Health Behavior/Discharge Planning: Goal: Ability to identify and utilize available resources and services will improve Outcome: Progressing Goal: Ability to manage health-related needs will improve Outcome:  Progressing   Problem: Metabolic: Goal: Ability to maintain appropriate glucose levels will improve Outcome: Progressing   Problem: Nutritional: Goal: Maintenance of adequate nutrition will improve Outcome: Progressing Goal: Progress toward achieving an optimal weight will improve Outcome: Progressing   Problem: Skin Integrity: Goal: Risk for impaired skin integrity will decrease Outcome: Progressing   Problem: Tissue Perfusion: Goal: Adequacy of tissue perfusion will improve Outcome: Progressing

## 2024-03-10 DIAGNOSIS — R739 Hyperglycemia, unspecified: Secondary | ICD-10-CM | POA: Diagnosis not present

## 2024-03-10 DIAGNOSIS — M48062 Spinal stenosis, lumbar region with neurogenic claudication: Secondary | ICD-10-CM | POA: Diagnosis not present

## 2024-03-10 LAB — GLUCOSE, CAPILLARY
Glucose-Capillary: 209 mg/dL — ABNORMAL HIGH (ref 70–99)
Glucose-Capillary: 221 mg/dL — ABNORMAL HIGH (ref 70–99)
Glucose-Capillary: 190 mg/dL — ABNORMAL HIGH (ref 70–99)

## 2024-03-10 MED ORDER — SENNOSIDES-DOCUSATE SODIUM 8.6-50 MG PO TABS
1.0000 | ORAL_TABLET | Freq: Every day | ORAL | Status: DC
  Administered 2024-03-11 – 2024-03-15 (×2): 1 via ORAL
  Filled 2024-03-10 (×9): qty 1

## 2024-03-10 NOTE — Plan of Care (Signed)
  Problem: Consults Goal: RH GENERAL PATIENT EDUCATION Description: See Patient Education module for education specifics. Outcome: Progressing   Problem: RH BOWEL ELIMINATION Goal: RH STG MANAGE BOWEL WITH ASSISTANCE Description: STG Manage Bowel with  minimal Assistance. Outcome: Progressing   Problem: RH BLADDER ELIMINATION Goal: RH STG MANAGE BLADDER WITH ASSISTANCE Description: STG Manage Bladder With minimal Assistance Outcome: Progressing   Problem: RH SKIN INTEGRITY Goal: RH STG SKIN FREE OF INFECTION/BREAKDOWN Description: Manage skin free of infection/breakdown with minimal assistance Outcome: Progressing   Problem: RH SAFETY Goal: RH STG ADHERE TO SAFETY PRECAUTIONS W/ASSISTANCE/DEVICE Description: STG Adhere to Safety Precautions With minimal Assistance/Device. Outcome: Progressing   Problem: RH PAIN MANAGEMENT Goal: RH STG PAIN MANAGED AT OR BELOW PT'S PAIN GOAL Description: <4 w/ prns Outcome: Progressing   Problem: RH KNOWLEDGE DEFICIT GENERAL Goal: RH STG INCREASE KNOWLEDGE OF SELF CARE AFTER HOSPITALIZATION Description: Manage increase knowledge of self care after hospitalization with minimal assistance from spouse using educational materials provided Outcome: Progressing   Problem: Education: Goal: Ability to describe self-care measures that may prevent or decrease complications (Diabetes Survival Skills Education) will improve Outcome: Progressing Goal: Individualized Educational Video(s) Outcome: Progressing   Problem: Coping: Goal: Ability to adjust to condition or change in health will improve Outcome: Progressing   Problem: Fluid Volume: Goal: Ability to maintain a balanced intake and output will improve Outcome: Progressing   Problem: Health Behavior/Discharge Planning: Goal: Ability to identify and utilize available resources and services will improve Outcome: Progressing Goal: Ability to manage health-related needs will improve Outcome:  Progressing   Problem: Metabolic: Goal: Ability to maintain appropriate glucose levels will improve Outcome: Progressing   Problem: Nutritional: Goal: Maintenance of adequate nutrition will improve Outcome: Progressing Goal: Progress toward achieving an optimal weight will improve Outcome: Progressing   Problem: Skin Integrity: Goal: Risk for impaired skin integrity will decrease Outcome: Progressing   Problem: Tissue Perfusion: Goal: Adequacy of tissue perfusion will improve Outcome: Progressing

## 2024-03-10 NOTE — Progress Notes (Signed)
 Occupational Therapy Session Note  Patient Details  Name: Christian [name] MRN: [id number] Date of Birth: 12-Jan-1951  Today's Date: 03/10/2024 OT Individual Time: 0800-0915 & 1300-1405 OT Individual Time Calculation (min): 75 min & 65 min   Short Term Goals: Week 1:  OT Short Term Goal 1 (Week 1): LTG=STG  Skilled Therapeutic Interventions/Progress Updates:      Therapy Documentation Precautions:  Precautions Precautions: Fall, Back Precaution/Restrictions Comments: LSO, able to recall precautions Required Braces or Orthoses: Spinal Brace Spinal Brace: Lumbar corset, Applied in sitting position Restrictions Weight Bearing Restrictions Per Provider Order: No Session 1 General: "Hi there" Pt supine in bed upon OT arrival, agreeable to OT session.  Pain: no pain reported  ADL: OT providing skilled intervention on ADL retraining in order to increase independence with tasks and increase activity tolerance. Pt completed the following tasks at the current level of assist: Bed mobility: SBA with increased time and use of bed rails UB dressing: OT assisting with donning LSO at total A d/t pt shoulder limitations Transfers: Min A sit to stand from heightened bed and stand pivot from bed>W/C, OT simulating approximate bead height from home for increased generalization of tasks   Other Treatments: OT providing therapeutic use of self in order to build rapport and discuss patient current situation and goals for therapy. Pt reporting wife was assisting with toileting at home d/t shoulder limitations. OT discussing DME that pt currently owns for D/C planning. Pt reports does not own RW and wll need one for D/C.    Pt seated in W/C at end of session with W/C alarm donned, call light within reach and 4Ps assessed.    Session 2 General: "I feel so much better" Pt seated in W/C upon OT arrival, agreeable to OT.  Pain:  unrated pain reported in back, shower,activity, intermittent rest  breaks, distractions provided for pain management, pt reports tolerable to proceed.   ADL: OT providing skilled intervention on ADL retraining in order to increase independence with tasks and increase activity tolerance. Pt completed the following tasks at the current level of assist: Bed mobility: CGA, VC for positioning, OT assisting with supporting RLE  Toilet transfer: CGA from TTB>toilet with RW no LOB Toileting: Min A d/t shoulder limitations for thoroughness for peri care, pt able to manage pants on/off waist standing at RW UB dressing: Min A, assistance required overhead d/t shoulder limitations, able to thread arms into shirt LB dressing: Min A, requiring assistance for thoroughness on threading onto feet, able to manage pants in standing at RW with intermittent unsupported standing Footwear: total A for donning/doffing socks Shower transfer: CGA with RW ambulating from W/C ~20 ft  Bathing: Min A overall, assistance for peri hygiene   Intermittent rest breaks required during ADL tasks for energy conservation.   Pt supine in bed with bed alarm activated, 2 bed rails up, call light within reach and 4Ps assessed.   Therapy/Group: Individual Therapy  Nila Barth, OTD, OTR/L 03/10/2024, 4:02 PM

## 2024-03-10 NOTE — Progress Notes (Signed)
 PROGRESS NOTE   Subjective/Complaints:  Pt doing well again today, slept ok, pain managed well, LBM yesterday, urinating fine. Denies any other complaints or concerns today.   ROS: as per HPI. Denies CP, SOB, abd pain, N/V/D/C, or any other complaints at this time.    Objective:   No results found. Recent Labs    03/08/24 0633  WBC 10.4  HGB 10.7*  HCT 32.2*  PLT 199   Recent Labs    03/08/24 0633  NA 135  K 3.8  CL 104  CO2 25  GLUCOSE 190*  BUN 11  CREATININE 0.66  CALCIUM 8.3*    Intake/Output Summary (Last 24 hours) at 03/10/2024 1104 Last data filed at 03/10/2024 0829 Gross per 24 hour  Intake 840 ml  Output --  Net 840 ml        Physical Exam: Vital Signs Blood pressure 126/66, pulse 88, temperature 98 F (36.7 C), temperature source Oral, resp. rate 18, height 5\' 6"  (1.676 m), weight (!) 136.2 kg, SpO2 99%.   General: awake, alert, appropriate, laying in bed; NAD HENT: conjugate gaze; oropharynx moist CV: regular rhythm, reg rate today; no JVD Pulmonary: CTA B/L; no W/R/R- good air movement GI: soft, NT, ND, (+)BS- protuberant- BMI 48 Psychiatric: appropriate- interactive Neurological: Ox3  PRIOR EXAMS: Musculoskeletal:     Cervical back: Neck supple. No tenderness.     Right lower leg: No edema.     Comments: Deltoids 2-/5 due to probable RTC tears; biceps 4+/5 on R 5-/5 on L; Triceps 4+/5 on R and 5-/5 on L; Grip and FA 4+/5 on R and 5-/5 on L- but very limited by having to do hand walking up body for shoulders LE's- RLE- HF 4-/5; KE 4+/5; DF/PF 5-/5 LLE- HF 2-/5; cannot extend or flex L knee due to "bad L knee"; 4+/5 DF and PF Literally cannot extend L knee past 45 degrees, but says can when standing with assistive device  Skin:    General: Skin is warm and dry.     Comments: Incision with some sanguinous drainage- tegaderm placed with dressing underneath it  Neurological:     Mental  Status: He is alert and oriented to person, place, and time.     Comments: Decreased to light touch B/L at midcalf- worse on RLE to light touch and pinprick- absent to pinprick on bottom of feet   Assessment/Plan: 1. Functional deficits which require 3+ hours per day of interdisciplinary therapy in a comprehensive inpatient rehab setting. Physiatrist is providing close team supervision and 24 hour management of active medical problems listed below. Physiatrist and rehab team continue to assess barriers to discharge/monitor patient progress toward functional and medical goals  Care Tool:  Bathing        Body parts bathed by helper: Right arm, Left upper leg, Left arm, Right lower leg, Chest, Abdomen, Left lower leg, Front perineal area, Buttocks, Right upper leg     Bathing assist Assist Level: Total Assistance - Patient < 25%     Upper Body Dressing/Undressing Upper body dressing   What is the patient wearing?: Pull over shirt    Upper body assist Assist Level: Total  Assistance - Patient < 25%    Lower Body Dressing/Undressing Lower body dressing      What is the patient wearing?: Underwear/pull up, Pants     Lower body assist Assist for lower body dressing: Total Assistance - Patient < 25%     Toileting Toileting Toileting Activity did not occur (Clothing management and hygiene only): N/A (no void or bm)  Toileting assist Assist for toileting: Moderate Assistance - Patient 50 - 74%     Transfers Chair/bed transfer  Transfers assist     Chair/bed transfer assist level: Moderate Assistance - Patient 50 - 74%     Locomotion Ambulation   Ambulation assist   Ambulation activity did not occur: Safety/medical concerns          Walk 10 feet activity   Assist  Walk 10 feet activity did not occur: Safety/medical concerns        Walk 50 feet activity   Assist Walk 50 feet with 2 turns activity did not occur: Safety/medical concerns         Walk  150 feet activity   Assist Walk 150 feet activity did not occur: Safety/medical concerns         Walk 10 feet on uneven surface  activity   Assist Walk 10 feet on uneven surfaces activity did not occur: Safety/medical concerns         Wheelchair     Assist Is the patient using a wheelchair?: Yes Type of Wheelchair: Manual Wheelchair activity did not occur: Safety/medical concerns         Wheelchair 50 feet with 2 turns activity    Assist    Wheelchair 50 feet with 2 turns activity did not occur: Safety/medical concerns       Wheelchair 150 feet activity     Assist  Wheelchair 150 feet activity did not occur: Safety/medical concerns       Blood pressure 126/66, pulse 88, temperature 98 F (36.7 C), temperature source Oral, resp. rate 18, height 5\' 6"  (1.676 m), weight (!) 136.2 kg, SpO2 99%.  Medical Problem List and Plan: 1. Functional deficits secondary to severe lumbar stenosis with B/L LE radiculopathy s/p PSF at L4-S1             -patient may  shower -ELOS/Goals: 10-12 days min A- was using scooter outside home prior- had been bedbound for 6 weeks prior to admission            -Con't CIR PT and OT 2.  Antithrombotics: -DVT/anticoagulation:  Mechanical: Sequential compression devices, entire leg Bilateral lower extremities -will need to check with surgeon at 7 days s/p surgery when can start DVT prophylaxis 5/2- will call Surgeon Monday to get ok to put on Lovenox             -antiplatelet therapy: ASA 81mg  daily 3. Pain Management:  tylenol  650mg  QID. Voltaren  gel QID. Cymbalta  60mg  daily. Hydrocodone  ineffective--will change to oxycodone  which he has tried in the past. 4. Mood/Behavior/Sleep: LCSW to follow for evaluation and support.             -antipsychotic agents: N/A 5. Neuropsych/cognition: This patient is capable of making decisions on his own behalf. 6. Skin/Wound Care: Routine pressure relief measures.              --monitor  drainage from wound. Dry dressing daily.  7. Fluids/Electrolytes/Nutrition: Monitor I/O and routine labs  8. T2DM w/neuropathy: Hgb A1c-5.9 and better controlled. On Novolin 100 units am/30  units pm prn BS>190 and Metformin  2000 mg daily am.  --Monitor BS ac/hs. Followed by Dr. Marianna Shirk --Currently on NPH 10 units bid w/novolog  6 units ac TID/HS?             --will d/c hs coverage and use SSI for now             --Resume metformin  in am and d/c NPH to help transition to home regimen. -Can use Libre to monitor CBGs  5/2- will get pt back on some of home dose and titrate over-  -5/3-4/25 CBGs pretty good, cont regimen as is for now.  CBG (last 3)  Recent Labs    03/09/24 1628 03/09/24 2033 03/10/24 0543  GLUCAP 191* 214* 209*    9.  ABLA: Has had drop in Hgb from 14.0 to 11.2 due to post op anemia             --monitor H/H for trend. Monitor for signs of bleeding. 5/2- Hb 10.7- will monitor for bleeding- incision shows scant drainage, but not enough to explain drop- likely due to not being checked for a few days   10. Leucocytosis: WBC up to 13.3--likely reactive. Monitor for fevers and other signs of infection  5/2- WBC down to 10.4k- monitor clinically             --monitor drainage.  11. Hx of  DOE:  Echo 02/27/24 showed Moderate to severe concentric hypertrophy with EF 45-50% and mild LV hypokinesis. --hx of mild asymptomatic tachycardia per PCP notes. Cont metoprolol  XL 25mg  daily.  12. 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 Rozelle Corning 15. Morbid obesity-BMI 48.46: RD consult for dietary education.  --Encourage/educate on importance of diet and wt loss to help promote health and mobility.  16. Urinary retention- came to use with Foley catheter- will remove in AM and do bladder scans to make  sure is emptying-cath if volumes >350cc with coude' 5/2- removed foley this AM- hasn't been 6 hours yet, but in 1-2 hours, will need to be cathed if hasn't voided -03/09/24 low PVRs overnight, monitor. Continue flomax  0.4mg  nightly 17. Constipation- LBM 5 days ago- pt refuses Sorbitol  because scared of diarrhea/accidents. Continue miralax  BID and senokot 2 tabs qAM 5/2- no BM yet- if no BM by tomorrow- needs to get cleaned out some way over weekend -03/09/24 LBM this morning, large type 4; monitor -03/10/24 LBM yesterday several times, mushy, will decrease senokot to 1 tab daily, monitor  18. GI PPx: Protonix  20mg  daily       LOS: 3 days A FACE TO FACE EVALUATION WAS PERFORMED  [address] 03/10/2024, 11:04 AM

## 2024-03-10 NOTE — Progress Notes (Addendum)
 Physical Therapy Session Note  Patient Details  Name: Christian [name] MRN: [id number] Date of Birth: Apr 05, 1951  Today's Date: 03/10/2024 PT Individual Time: 4098-1191 PT Individual Time Calculation (min): 66 min   Short Term Goals: Week 1:  PT Short Term Goal 1 (Week 1): pt will ambulate 100 ft with LRAD PT Short Term Goal 2 (Week 1): Pt will perform STS with CGA PT Short Term Goal 3 (Week 1): Pt will maintain back precautions without cue throughout sessions.  Skilled Therapeutic Interventions/Progress Updates:  Pt received sitting in his w/c in his room. Agreeable to PT tx. Reports back pain to be 4-5/10 and requests pain meds. PT notified nursing who brought his meds.   Therex: w/c mobility forward/backwards with and without resistance > 300 ft with PT education on posture while performing. STS multiple trials from w/c to RW with PT verbal cues for hand placement on w/c and not RW for safety. Pt required min to moderate assist for initiation of all STS. Standing tolerance activities up to 20 sec prior to rest break request with PT CGA for safety. Pt with difficulty with eccentric descent with STS requiring verbal cues to perform. Pt very limited with mobility due to decreased endurance, weakness, and bil shoulder endurance. Pt did not have complaints of shoulder pain with w/c mobility this visit.   Pt left in w/c, seat belt alarm on, all needs within reach. No complaints of pain after tx. Pt very talkative throughout tx; PT had to assist with staying on task.      Therapy Documentation Precautions:  Precautions Precautions: Fall, Back Precaution/Restrictions Comments: LSO, able to recall precautions Required Braces or Orthoses: Spinal Brace Spinal Brace: Lumbar corset, Applied in sitting position Restrictions Weight Bearing Restrictions Per Provider Order: No   Therapy/Group: Individual Therapy  Jeannene Milling 03/10/2024, 7:38 AM

## 2024-03-11 DIAGNOSIS — M48062 Spinal stenosis, lumbar region with neurogenic claudication: Secondary | ICD-10-CM | POA: Diagnosis not present

## 2024-03-11 LAB — GLUCOSE, CAPILLARY
Glucose-Capillary: 180 mg/dL — ABNORMAL HIGH (ref 70–99)
Glucose-Capillary: 204 mg/dL — ABNORMAL HIGH (ref 70–99)

## 2024-03-11 LAB — BASIC METABOLIC PANEL WITH GFR
Anion gap: 10 (ref 5–15)
BUN: 17 mg/dL (ref 8–23)
CO2: 26 mmol/L (ref 22–32)
Calcium: 8.9 mg/dL (ref 8.9–10.3)
Chloride: 101 mmol/L (ref 98–111)
Creatinine, Ser: 0.78 mg/dL (ref 0.61–1.24)
GFR, Estimated: >60 mL/min (ref >60)
Glucose, Bld: 169 mg/dL — ABNORMAL HIGH (ref 70–99)
Potassium: 4 mmol/L (ref 3.5–5.1)
Sodium: 137 mmol/L (ref 135–145)

## 2024-03-11 LAB — CBC
HCT: 33.7 % — ABNORMAL LOW (ref 39.0–52.0)
Hemoglobin: 11 g/dL — ABNORMAL LOW (ref 13.0–17.0)
MCH: 32.4 pg (ref 26.0–34.0)
MCHC: 32.6 g/dL (ref 30.0–36.0)
MCV: 99.1 fL (ref 80.0–100.0)
Platelets: 276 10*3/uL (ref 150–400)
RBC: 3.4 MIL/uL — ABNORMAL LOW (ref 4.22–5.81)
RDW: 13.8 % (ref 11.5–15.5)
WBC: 12.2 10*3/uL — ABNORMAL HIGH (ref 4.0–10.5)
nRBC: 0 % (ref 0.0–0.2)

## 2024-03-11 MED ORDER — BENEPROTEIN PO POWD
1.0000 | Freq: Three times a day (TID) | ORAL | Status: DC
Start: 2024-03-11 — End: 2024-03-29

## 2024-03-11 NOTE — Plan of Care (Signed)
  Problem: Consults Goal: RH GENERAL PATIENT EDUCATION Description: See Patient Education module for education specifics. Outcome: Progressing   Problem: RH BOWEL ELIMINATION Goal: RH STG MANAGE BOWEL WITH ASSISTANCE Description: STG Manage Bowel with  minimal Assistance. Outcome: Progressing   Problem: RH BLADDER ELIMINATION Goal: RH STG MANAGE BLADDER WITH ASSISTANCE Description: STG Manage Bladder With minimal Assistance Outcome: Progressing   Problem: RH SKIN INTEGRITY Goal: RH STG SKIN FREE OF INFECTION/BREAKDOWN Description: Manage skin free of infection/breakdown with minimal assistance Outcome: Progressing   Problem: RH SAFETY Goal: RH STG ADHERE TO SAFETY PRECAUTIONS W/ASSISTANCE/DEVICE Description: STG Adhere to Safety Precautions With minimal Assistance/Device. Outcome: Progressing   Problem: RH PAIN MANAGEMENT Goal: RH STG PAIN MANAGED AT OR BELOW PT'S PAIN GOAL Description: <4 w/ prns Outcome: Progressing   Problem: RH KNOWLEDGE DEFICIT GENERAL Goal: RH STG INCREASE KNOWLEDGE OF SELF CARE AFTER HOSPITALIZATION Description: Manage increase knowledge of self care after hospitalization with minimal assistance from spouse using educational materials provided Outcome: Progressing   Problem: Education: Goal: Ability to describe self-care measures that may prevent or decrease complications (Diabetes Survival Skills Education) will improve Outcome: Progressing Goal: Individualized Educational Video(s) Outcome: Progressing   Problem: Coping: Goal: Ability to adjust to condition or change in health will improve Outcome: Progressing   Problem: Fluid Volume: Goal: Ability to maintain a balanced intake and output will improve Outcome: Progressing   Problem: Health Behavior/Discharge Planning: Goal: Ability to identify and utilize available resources and services will improve Outcome: Progressing Goal: Ability to manage health-related needs will improve Outcome:  Progressing   Problem: Metabolic: Goal: Ability to maintain appropriate glucose levels will improve Outcome: Progressing   Problem: Nutritional: Goal: Maintenance of adequate nutrition will improve Outcome: Progressing Goal: Progress toward achieving an optimal weight will improve Outcome: Progressing   Problem: Skin Integrity: Goal: Risk for impaired skin integrity will decrease Outcome: Progressing   Problem: Tissue Perfusion: Goal: Adequacy of tissue perfusion will improve Outcome: Progressing

## 2024-03-11 NOTE — Progress Notes (Signed)
 Physical Therapy Session Note  Patient Details  Name: Christian [name] MRN: [id number] Date of Birth: 07-Mar-1951  Today's Date: 03/11/2024 PT Individual Time: 1430-1500 PT Individual Time Calculation (min): 30 min   Short Term Goals: Week 1:  PT Short Term Goal 1 (Week 1): pt will ambulate 100 ft with LRAD PT Short Term Goal 2 (Week 1): Pt will perform STS with CGA PT Short Term Goal 3 (Week 1): Pt will maintain back precautions without cue throughout sessions.  Skilled Therapeutic Interventions/Progress Updates:    Session focused on d/c planning and home environment discussion with pt and family present. Pt stating that he hurt his back earlier today and has decided he "needs to take it easy" this afternoon when offered OOB mobility or changing his clothes (his wife's request so she could take laundry home).  Discussed overall goals and ELOS as well as how pt was performing mobility prior. Discussed likely recommendation for use of RW at home, not furniture walking and they agree this could be manageable if they moved some furniture. Also discussed stair situation as pt was requiring A for stair negotiation PTA (Stair rails (Down on left, up on R)) and has had several falls in the past in the home or difficulty with stairs. Recommended that maybe they look into a ramp for longer term independence and ease of accessibility with which both were in agreement and state that the plan is it is in by summertime. Also discussed the bed at home and the wife reports concerns of him "flailing his legs" while he sleeps and has fallen out in the past. I suggested potential of pillows or use of rails as an option. They are planning to get a new platform bed that sits lower as well.  Assisted with repositioning in the bed with pt in trendelenberg and him pushing through BUE to scoot up in the bed with supervision.     Therapy Documentation Precautions:  Precautions Precautions: Fall,  Back Precaution/Restrictions Comments: LSO, able to recall precautions Required Braces or Orthoses: Spinal Brace Spinal Brace: Lumbar corset, Applied in sitting position Restrictions Weight Bearing Restrictions Per Provider Order: No  Pain: Pain Assessment Pain Scale: 0-10 Pain Score: 5  Pain Location: Back Pain Intervention(s): Medication (See eMAR)    Therapy/Group: Individual Therapy  Maureen Sour ,sign  03/11/2024, 3:35 PM

## 2024-03-11 NOTE — Discharge Instructions (Addendum)
 Inpatient Rehab Discharge Instructions  Christian [name] Discharge date and time:    Activities/Precautions/ Functional Status: Activity: no lifting, driving, or strenuous exercise till cleared by MD Diet: diabetic diet Wound Care: keep wound clean and dry   Functional status:  ___ No restrictions     ___ Walk up steps independently _X__ 24/7 supervision/assistance   ___ Walk up steps with assistance ___ Intermittent supervision/assistance  ___ Bathe/dress independently ___ Walk with walker     _X__ Bathe/dress with assistance ___ Walk Independently    ___ Shower independently _X__ Walk with assistance    ___ Shower with assistance _X__ No alcohol     ___ Return to work/school ________  Special Instructions: Resume your libre--Monitor blood sugars and increase insulin  by 5 units to home dose. If running high after 24 hours--resume home regimen or contact Endocrine for input. .  2.   No bending, twisting or arching. Wear brace when at edge of brace or out of bed.     COMMUNITY REFERRALS UPON DISCHARGE:    Outpatient: PT             Agency:[facility]  [address] [place]  [address] Phone:[phone]              Appointment Date/Time:WILL CALL TO SCHEDULE FOLLOW UP APPOINTMENTS  Medical Equipment/Items Ordered:WIDE ROLLING WALKER AND BEDSIDE COMMODE                                                 Agency/Supplier:[facility]  [phone]     My questions have been answered and I understand these instructions. I will adhere to these goals and the provided educational materials after my discharge from the hospital.  Patient/Caregiver Signature _______________________________ Date __________  Clinician Signature _______________________________________ Date __________  Please bring this form and your medication list with you to all your follow-up doctor's appointments.

## 2024-03-11 NOTE — Progress Notes (Signed)
 Occupational Therapy Session Note  Patient Details  Name: Christian [name] MRN: [id number] Date of Birth: 02/21/1951  Today's Date: 03/11/2024 OT Individual Time: 4098-1191 OT Individual Time Calculation (min): 70 min    Short Term Goals: Week 1:  OT Short Term Goal 1 (Week 1): LTG=STG  Skilled Therapeutic Interventions/Progress Updates:    Pt resting in bed upon arrival. Skilled OT intervention with focus on bed mobility, sit<>stand, standing balance, functional amb with RW, DME/discharge planning, and toileting to increase independence with BADLs. Supine>sit EOB with supervision using hospital bed funcitons. Max A for donning LSO. Sit<>stand from EOB and stand step pivot transfer with CGA. Demonstrated use of TTB in tub room and discussed recommendaitons and current equipment use at home. Pt states he has a cane but usually "surfs" around home (furniture walking). Discussed therapy recommendations. Pt erquested to use toielt. Pt amb wirh RW to bathroom and sat on BSC over toilet. Mod A for toileting tasks. Sit<>stand with CGA using hand rails in bathroom. Pt amb with RW back into room and sat in recliner. CGA for all amb. Pt remained in relciner with all needs within reach. Daughter present.   Therapy Documentation Precautions:  Precautions Precautions: Fall, Back Precaution/Restrictions Comments: LSO, able to recall precautions Required Braces or Orthoses: Spinal Brace Spinal Brace: Lumbar corset, Applied in sitting position Restrictions Weight Bearing Restrictions Per Provider Order: No Pain: Pt reports 5/10 back pain with avtibity; meds admin by nursing during session  Therapy/Group: Individual Therapy  [name] 03/11/2024, 9:34 AM

## 2024-03-11 NOTE — Progress Notes (Addendum)
 Occupational Therapy Session Note  Patient Details  Name: MANISH PICARDO MRN: [id number] Date of Birth: [birth year]  Today's Date: 03/11/2024 OT Individual Time: 1000-1045 & 1345-1407 OT Individual Time Calculation (min): 45 min & 22 min Missed minutes: 23 min; pt fatigue and pain, see below   Short Term Goals: Week 1:  OT Short Term Goal 1 (Week 1): LTG=STG  Skilled Therapeutic Interventions/Progress Updates:      Therapy Documentation Precautions:  Precautions Precautions: Fall, Back Precaution/Restrictions Comments: LSO, able to recall precautions Required Braces or Orthoses: Spinal Brace Spinal Brace: Lumbar corset, Applied in sitting position Restrictions Weight Bearing Restrictions Per Provider Order: No Session 1 General: "I am hurting today" Pt supine in bed upon OT arrival, agreeable to OT session. Daughter present.   Pain:  7/10 pain reported in low back and BLE, activity, intermittent rest breaks, distractions provided for pain management, pt reports tolerable to proceed. Nsg aware and notified of pain levels  ADL: OT providing skilled intervention with pain management and positioning for decreased pain. OT applied Voltarin gel for pain management with nsg permission to low back and bilateral knees. OT providing Min A with bed mobility OOB, then back into bed after session with assistance with LE. Pt completed sit to stand at Min A from raised bed and completed side stepping at [facility] with Lt leg leading to get closer to EOB. OT providing education on alternative modalities for pain such as heat or ice for pain management. OT and pt also discussing DME for home, OT recommending TTB for bathing, pt has all other OT equipment.    Pt supine in bed with bed alarm activated, 2 bed rails up, call light within reach and 4Ps assessed.   Session 2 General: "Hello" Pt on commode upon OT arrival requesting to get back to bed. OT providing encouragement for further participation,  pt declining intervention beyond toileting.  Pain:  8/10 pain reported in low back and BLE, activity, intermittent rest breaks, distractions provided for pain management, pt reports tolerable to proceed.   ADL: OT providing skilled intervention on ADL retraining in order to increase independence with tasks and increase activity tolerance. Pt completed the following tasks at the current level of assist: Bed mobility: SBA with HOB raised and use of bed rails  Toilet transfer: SBA with RW ambulating to bed from toilet ~15 ft Toileting: voiding urine, SBA toileting, able to manage pants standing at RW   Pt supine in bed with bed alarm activated, 2 bed rails up, call light within reach and 4Ps assessed.   Therapy/Group: Individual Therapy  Nila Barth, OTD, OTR/L 03/11/2024, 3:50 PM

## 2024-03-11 NOTE — Progress Notes (Signed)
 PROGRESS NOTE   Subjective/Complaints:  Pt reports no issues- slept on and off overnight- woke 2x to go to bathroom.  LBM this am- overnight.  Pain 'about the same'.    ROS:  Pt denies SOB, abd pain, CP, N/V/C/D, and vision changes  Objective:   No results found. No results for input(s): "WBC", "HGB", "HCT", "PLT" in the last 72 hours.  No results for input(s): "NA", "K", "CL", "CO2", "GLUCOSE", "BUN", "CREATININE", "CALCIUM" in the last 72 hours.   Intake/Output Summary (Last 24 hours) at 03/11/2024 0809 Last data filed at 03/10/2024 1320 Gross per 24 hour  Intake 360 ml  Output --  Net 360 ml        Physical Exam: Vital Signs Blood pressure (!) 114/57, pulse (!) 107, temperature 97.8 F (36.6 C), temperature source Oral, resp. rate 18, height 5\' 6"  (1.676 m), weight (!) 136.2 kg, SpO2 99%.    General: awake, alert, appropriate, sitting up in bed; needs to use 1 arm to inch shoulder up; NAD HENT: conjugate gaze; oropharynx moist CV: regular rate and rhythm- in 80's- ; no JVD Pulmonary: CTA B/L; no W/R/R- good air movement GI: soft, NT, ND, (+)BS- slightly hypoactive Psychiatric: appropriate Neurological: Ox3   PRIOR EXAMS: Musculoskeletal:     Cervical back: Neck supple. No tenderness.     Right lower leg: No edema.     Comments: Deltoids 2-/5 due to probable RTC tears; biceps 4+/5 on R 5-/5 on L; Triceps 4+/5 on R and 5-/5 on L; Grip and FA 4+/5 on R and 5-/5 on L- but very limited by having to do hand walking up body for shoulders LE's- RLE- HF 4-/5; KE 4+/5; DF/PF 5-/5 LLE- HF 2-/5; cannot extend or flex L knee due to "bad L knee"; 4+/5 DF and PF Literally cannot extend L knee past 45 degrees, but says can when standing with assistive device  Skin:    General: Skin is warm and dry.     Comments: Incision with some sanguinous drainage- tegaderm placed with dressing underneath it  Neurological:      Mental Status: He is alert and oriented to person, place, and time.     Comments: Decreased to light touch B/L at [place]- worse on RLE to light touch and pinprick- absent to pinprick on bottom of feet   Assessment/Plan: 1. Functional deficits which require 3+ hours per day of interdisciplinary therapy in a comprehensive inpatient rehab setting. Physiatrist is providing close team supervision and 24 hour management of active medical problems listed below. Physiatrist and rehab team continue to assess barriers to discharge/monitor patient progress toward functional and medical goals  Care Tool:  Bathing        Body parts bathed by helper: Right arm, Left upper leg, Left arm, Right lower leg, Chest, Abdomen, Left lower leg, Front perineal area, Buttocks, Right upper leg     Bathing assist Assist Level: Total Assistance - Patient < 25%     Upper Body Dressing/Undressing Upper body dressing   What is the patient wearing?: Pull over shirt    Upper body assist Assist Level: Total Assistance - Patient < 25%    Lower Body  Dressing/Undressing Lower body dressing      What is the patient wearing?: Underwear/pull up, Pants     Lower body assist Assist for lower body dressing: Total Assistance - Patient < 25%     Toileting Toileting Toileting Activity did not occur (Clothing management and hygiene only): N/A (no void or bm)  Toileting assist Assist for toileting: Moderate Assistance - Patient 50 - 74%     Transfers Chair/bed transfer  Transfers assist     Chair/bed transfer assist level: Moderate Assistance - Patient 50 - 74%     Locomotion Ambulation   Ambulation assist   Ambulation activity did not occur: Safety/medical concerns          Walk 10 feet activity   Assist  Walk 10 feet activity did not occur: Safety/medical concerns        Walk 50 feet activity   Assist Walk 50 feet with 2 turns activity did not occur: Safety/medical concerns          Walk 150 feet activity   Assist Walk 150 feet activity did not occur: Safety/medical concerns         Walk 10 feet on uneven surface  activity   Assist Walk 10 feet on uneven surfaces activity did not occur: Safety/medical concerns         Wheelchair     Assist Is the patient using a wheelchair?: Yes Type of Wheelchair: Manual Wheelchair activity did not occur: Safety/medical concerns         Wheelchair 50 feet with 2 turns activity    Assist    Wheelchair 50 feet with 2 turns activity did not occur: Safety/medical concerns       Wheelchair 150 feet activity     Assist  Wheelchair 150 feet activity did not occur: Safety/medical concerns       Blood pressure (!) 114/57, pulse (!) 107, temperature 97.8 F (36.6 C), temperature source Oral, resp. rate 18, height 5\' 6"  (1.676 m), weight (!) 136.2 kg, SpO2 99%.  Medical Problem List and Plan: 1. Functional deficits secondary to severe lumbar stenosis with B/L LE radiculopathy s/p PSF at L4-S1             -patient may  shower -ELOS/Goals: 10-12 days min A- was using scooter outside home prior- had been bedbound for 6 weeks prior to admission            -Con't CIR PT and OT Team conference tomorrow 2.  Antithrombotics: -DVT/anticoagulation:  Mechanical: Sequential compression devices, entire leg Bilateral lower extremities -will need to check with surgeon at 7 days s/p surgery when can start DVT prophylaxis 5/2- will call Surgeon Monday to get ok to put on Lovenox 5/5- asked PA to get ahold of surgeon to start lovenox             -antiplatelet therapy: ASA 81mg  daily 3. Pain Management:  tylenol  650mg  QID. Voltaren  gel QID. Cymbalta  60mg  daily. Hydrocodone  ineffective--will change to oxycodone  which he has tried in the past. 4. Mood/Behavior/Sleep: LCSW to follow for evaluation and support.             -antipsychotic agents: N/A 5. Neuropsych/cognition: This patient is capable of making decisions  on his own behalf. 6. Skin/Wound Care: Routine pressure relief measures.              --monitor drainage from wound. Dry dressing daily.  7. Fluids/Electrolytes/Nutrition: Monitor I/O and routine labs  8. T2DM w/neuropathy: Hgb A1c-5.9 and better  controlled. On Novolin 100 units am/30 units pm prn BS>190 and Metformin  2000 mg daily am.  --Monitor BS ac/hs. Followed by Dr. Marianna Shirk --Currently on NPH 10 units bid w/novolog  6 units ac TID/HS?             --will d/c hs coverage and use SSI for now             --Resume metformin  in am and d/c NPH to help transition to home regimen. -Can use Libre to monitor CBGs  5/2- will get pt back on some of home dose and titrate over-  -5/3-4/25 CBGs pretty good, cont regimen as is for now.  5/5- CBG's running 180's to 220's- doing better- con't to monitor CBG (last 3)  Recent Labs    03/10/24 1610 03/10/24 2036 03/11/24 0531  GLUCAP 221* 190* 180*    9.  ABLA: Has had drop in Hgb from 14.0 to 11.2 due to post op anemia             --monitor H/H for trend. Monitor for signs of bleeding. 5/2- Hb 10.7- will monitor for bleeding- incision shows scant drainage, but not enough to explain drop- likely due to not being checked for a few days  5/5- Hb up to 11k 10. Leucocytosis: WBC up to 13.3--likely reactive. Monitor for fevers and other signs of infection  5/2- WBC down to 10.4k- monitor clinically  5/5- WBC up to 12.2 k- this AM- no Sx's of illness per pt- will monitor clinically.              --monitor drainage.  11. Hx of  DOE:  Echo 02/27/24 showed Moderate to severe concentric hypertrophy with EF 45-50% and mild LV hypokinesis. --hx of mild asymptomatic tachycardia per PCP notes. Cont metoprolol  XL 25mg  daily.  12. 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  Rozelle Corning 15. Morbid obesity-BMI 48.46: RD consult for dietary education.  --Encourage/educate on importance of diet and wt loss to help promote health and mobility.  16. Urinary retention- came to use with Foley catheter- will remove in AM and do bladder scans to make sure is emptying-cath if volumes >350cc with coude' 5/2- removed foley this AM- hasn't been 6 hours yet, but in 1-2 hours, will need to be cathed if hasn't voided -03/09/24 low PVRs overnight, monitor. Continue flomax  0.4mg  nightly 5/5- peeing well- con't regimen 17. Constipation- LBM 5 days ago- pt refuses Sorbitol  because scared of diarrhea/accidents. Continue miralax  BID and senokot 2 tabs qAM 5/2- no BM yet- if no BM by tomorrow- needs to get cleaned out some way over weekend -03/09/24 LBM this morning, large type 4; monitor -03/10/24 LBM yesterday several times, mushy, will decrease senokot to 1 tab daily, monitor  5/5- LBM overnight- going better  18. GI PPx: Protonix  20mg  daily    I spent a total of 40   minutes on total care today- >50% coordination of care- due to  D/w PA about trying to get ahold of surgeon for Lovenox.     LOS: 4 days A FACE TO FACE EVALUATION WAS PERFORMED  Christian [name] 03/11/2024, 8:09 AM

## 2024-03-12 DIAGNOSIS — M48062 Spinal stenosis, lumbar region with neurogenic claudication: Secondary | ICD-10-CM | POA: Diagnosis not present

## 2024-03-12 LAB — GLUCOSE, CAPILLARY
Glucose-Capillary: 159 mg/dL — ABNORMAL HIGH (ref 70–99)
Glucose-Capillary: 198 mg/dL — ABNORMAL HIGH (ref 70–99)
Glucose-Capillary: 172 mg/dL — ABNORMAL HIGH (ref 70–99)
Glucose-Capillary: 166 mg/dL — ABNORMAL HIGH (ref 70–99)

## 2024-03-12 MED ORDER — ENOXAPARIN SODIUM 40 MG/0.4ML IJ SOSY
40.0000 mg | PREFILLED_SYRINGE | INTRAMUSCULAR | Status: DC
  Administered 2024-03-12 – 2024-03-18 (×7): 40 mg via SUBCUTANEOUS
  Filled 2024-03-12 (×7): qty 0.4

## 2024-03-12 NOTE — Patient Care Conference (Signed)
 Inpatient RehabilitationTeam Conference and Plan of Care Update Date: 03/12/2024   Time: 1143 am    Patient Name: Christian [name]      Medical Record Number: [id number]  Date of Birth: [birth year] Sex: Male         Room/Bed: 4M06C/4M06C-01 Payor Info: Payor: MEDICARE / Plan: MEDICARE PART A AND B / Product Type: *No Product type* /    Admit Date/Time:  03/07/2024 12:12 PM  Primary Diagnosis:  Neurogenic claudication due to lumbar spinal stenosis  Hospital Problems: Principal Problem:   Neurogenic claudication due to lumbar spinal stenosis    Expected Discharge Date: Expected Discharge Date: 03/19/24  Team Members Present: Physician leading conference: Dr. Celia Coles Social Worker Present: Adrianna Albee, LCSW Nurse Present: Jerene Monks, RN PT Present: Gita Lamb, PT OT Present: Nila Barth, OT PPS Coordinator present : Jestine Moron, SLP     Current Status/Progress Goal Weekly Team Focus  Bowel/Bladder   continent of bowel and bladder   remain continent   continence    Swallow/Nutrition/ Hydration               ADL's   Min A all ADLs, CGA-SBA with transfers with RW depending on fatigue/pain levels   CGA transfers, Min bathing, SBA LB ADL, Mod A toileting (will upgrade goals)   global strength/endurance, pain management, dynamic balance    Mobility   min A overall with RW for transfers and short distance gait, min assist 3" steps with rails, S w/c mobility   supervision overall ambulatory, CGA for 4 stairs for home entry with single rail  sit <> stands, bed mobility, gait training, stair negotiation, functional strengthening, balance, d/c planning    Communication                Safety/Cognition/ Behavioral Observations               Pain   8/10 back pain   pain free   pain medicine, deep breathing exercises    Skin   healing   free from infection  turning and positioning      Discharge Planning:  Home with wife who is able to provide  supervision-light min assist. Pt is motivated to do what he can for himself.   Team Discussion: Patient was admitted post PSF at L4-S1 due to severe lumbar stenosis with B/L LE radiculopathy. Patient limited by pain, morbid obesity, fatigue, poor endurance  and self limiting behaviors refusing therapy.   Patient on target to meet rehab goals: yes, Patient requires minimal assistance with ADLs. Patient requires minimal assistance with transfers and short distance gait  using a rolling walker. Overall goals at discharge are set for supervision.  *See Care Plan and progress notes for long and short-term goals.   Revisions to Treatment Plan:   N/A  Teaching Needs: Safety, medications, dietary modifications, toileting, transfers, incision care, etc.    Current Barriers to Discharge: Decreased caregiver support, Weight, Weight bearing restrictions, and Behavior  Possible Resolutions to Barriers: Family education Outpatient follow-up DME: walker/TTB     Medical Summary Current Status: Pt has severe RTC B/L shoulder injuries- incision on lumbar spine- looks good- took the rest of day off yesterday due to pain that "hurt in AM"- wife doesn't think RW appropriat ein home- was furniture walker  Barriers to Discharge: Complicated Wound;Uncontrolled Pain;Self-care education;Weight bearing restrictions;Morbid Obesity;Behavior/Mood;Uncontrolled Diabetes  Barriers to Discharge Comments: wife wants pt to not use hospital bed or rails-  needs ramp- was using  scooter outside house-  self limiting as well -refuses therapy somewhat regularly Possible Resolutions to [employer] Focus: goals CGA- supervision- when does participate-- titrate DM meds- maintain pain meds- d/c 5/13   Continued Need for Acute Rehabilitation Level of Care: The patient requires daily medical management by a physician with specialized training in physical medicine and rehabilitation for the following reasons: Direction of a  multidisciplinary physical rehabilitation program to maximize functional independence : Yes Medical management of patient stability for increased activity during participation in an intensive rehabilitation regime.: Yes Analysis of laboratory values and/or radiology reports with any subsequent need for medication adjustment and/or medical intervention. : Yes   I attest that I was present, lead the team conference, and concur with the assessment and plan of the team.   Robyne Matar Gayo 03/12/2024, 1143 am

## 2024-03-12 NOTE — Progress Notes (Addendum)
 PROGRESS NOTE   Subjective/Complaints:  Pt reports slept really hard- "great' overnight- just starting to wake up. Didn't see them bring in breakfast.  LBM yesterday  Knows we have team conference today to determine LOS.     ROS:   Pt denies SOB, abd pain, CP, N/V/C/D, and vision changes   Objective:   No results found. Recent Labs    03/11/24 0537  WBC 12.2*  HGB 11.0*  HCT 33.7*  PLT 276    Recent Labs    03/11/24 0537  NA 137  K 4.0  CL 101  CO2 26  GLUCOSE 169*  BUN 17  CREATININE 0.78  CALCIUM 8.9     Intake/Output Summary (Last 24 hours) at 03/12/2024 0943 Last data filed at 03/12/2024 0855 Gross per 24 hour  Intake 120 ml  Output --  Net 120 ml        Physical Exam: Vital Signs Blood pressure (!) 93/56, pulse 89, temperature 98.2 F (36.8 C), temperature source Oral, resp. rate 18, height 5\' 6"  (1.676 m), weight (!) 136.2 kg, SpO2 96%.     General: awake, alert, appropriate,  supine in bed- just woke up; breakfast tray at bedside- hasn't been touched; NAD HENT: conjugate gaze; oropharynx moist CV: regular rate and rhythm; no JVD Pulmonary: CTA B/L; no W/R/R- good air movement GI: soft, NT, ND, (+)BS- protuberant Psychiatric: appropriate- somewhat quiet- but just woke up  Neurological: Ox3 Extremities 0- trace LE edema B/L   PRIOR EXAMS: Musculoskeletal:     Cervical back: Neck supple. No tenderness.     Right lower leg: No edema.     Comments: Deltoids 2-/5 due to probable RTC tears; biceps 4+/5 on R 5-/5 on L; Triceps 4+/5 on R and 5-/5 on L; Grip and FA 4+/5 on R and 5-/5 on L- but very limited by having to do hand walking up body for shoulders LE's- RLE- HF 4-/5; KE 4+/5; DF/PF 5-/5 LLE- HF 2-/5; cannot extend or flex L knee due to "bad L knee"; 4+/5 DF and PF Literally cannot extend L knee past 45 degrees, but says can when standing with assistive device  Skin:    General:  Skin is warm and dry.     Comments: Incision with some sanguinous drainage- tegaderm placed with dressing underneath it  Neurological:     Mental Status: He is alert and oriented to person, place, and time.     Comments: Decreased to light touch B/L at [place]- worse on RLE to light touch and pinprick- absent to pinprick on bottom of feet   Assessment/Plan: 1. Functional deficits which require 3+ hours per day of interdisciplinary therapy in a comprehensive inpatient rehab setting. Physiatrist is providing close team supervision and 24 hour management of active medical problems listed below. Physiatrist and rehab team continue to assess barriers to discharge/monitor patient progress toward functional and medical goals  Care Tool:  Bathing        Body parts bathed by helper: Right arm, Left upper leg, Left arm, Right lower leg, Chest, Abdomen, Left lower leg, Front perineal area, Buttocks, Right upper leg     Bathing assist Assist Level: Total Assistance -  Patient < 25%     Upper Body Dressing/Undressing Upper body dressing   What is the patient wearing?: Pull over shirt    Upper body assist Assist Level: Total Assistance - Patient < 25%    Lower Body Dressing/Undressing Lower body dressing      What is the patient wearing?: Underwear/pull up, Pants     Lower body assist Assist for lower body dressing: Total Assistance - Patient < 25%     Toileting Toileting Toileting Activity did not occur Press photographer and hygiene only): N/A (no void or bm)  Toileting assist Assist for toileting: Maximal Assistance - Patient 25 - 49%     Transfers Chair/bed transfer  Transfers assist     Chair/bed transfer assist level: Moderate Assistance - Patient 50 - 74%     Locomotion Ambulation   Ambulation assist   Ambulation activity did not occur: Safety/medical concerns  Assist level: Minimal Assistance - Patient > 75% Assistive device: Walker-rolling Max distance: 90    Walk 10 feet activity   Assist  Walk 10 feet activity did not occur: Safety/medical concerns  Assist level: Minimal Assistance - Patient > 75% Assistive device: Walker-rolling   Walk 50 feet activity   Assist Walk 50 feet with 2 turns activity did not occur: Safety/medical concerns  Assist level: Minimal Assistance - Patient > 75% Assistive device: Walker-rolling    Walk 150 feet activity   Assist Walk 150 feet activity did not occur: Safety/medical concerns         Walk 10 feet on uneven surface  activity   Assist Walk 10 feet on uneven surfaces activity did not occur: Safety/medical concerns         Wheelchair     Assist Is the patient using a wheelchair?: Yes Type of Wheelchair: Manual Wheelchair activity did not occur: Safety/medical concerns         Wheelchair 50 feet with 2 turns activity    Assist    Wheelchair 50 feet with 2 turns activity did not occur: Safety/medical concerns       Wheelchair 150 feet activity     Assist  Wheelchair 150 feet activity did not occur: Safety/medical concerns       Blood pressure (!) 93/56, pulse 89, temperature 98.2 F (36.8 C), temperature source Oral, resp. rate 18, height 5\' 6"  (1.676 m), weight (!) 136.2 kg, SpO2 96%.  Medical Problem List and Plan: 1. Functional deficits secondary to severe lumbar stenosis with B/L LE radiculopathy s/p PSF at L4-S1             -patient may  shower -ELOS/Goals: 10-12 days min A- was using scooter outside home prior- had been bedbound for 6 weeks prior to admission            -Con't CIR PT and OT  Team conference today to determine length of stay 2.  Antithrombotics: -DVT/anticoagulation:  Mechanical: Sequential compression devices, entire leg Bilateral lower extremities -will need to check with surgeon at 7 days s/p surgery when can start DVT prophylaxis 5/2- will call Surgeon Monday to get ok to put on Lovenox 5/5- asked PA to get ahold of surgeon to  start lovenox 5/6- will d/w PA about getting ahold of surgeon- if we cannot, it's been enough time, will start Lovenox- will let pharmacy dose- for prophyalxis- has been 9 days since surgery             -antiplatelet therapy: ASA 81mg  daily 3. Pain Management:  tylenol   650mg  QID. Voltaren  gel QID. Cymbalta  60mg  daily. Hydrocodone  ineffective--will change to oxycodone  which he has tried in the past. 4. Mood/Behavior/Sleep: LCSW to follow for evaluation and support.             -antipsychotic agents: N/A 5. Neuropsych/cognition: This patient is capable of making decisions on his own behalf. 6. Skin/Wound Care: Routine pressure relief measures.              --monitor drainage from wound. Dry dressing daily.  7. Fluids/Electrolytes/Nutrition: Monitor I/O and routine labs  8. T2DM w/neuropathy: Hgb A1c-5.9 and better controlled. On Novolin 100 units am/30 units pm prn BS>190 and Metformin  2000 mg daily am.  --Monitor BS ac/hs. Followed by Dr. Marianna Shirk --Currently on NPH 10 units bid w/novolog  6 units ac TID/HS?             --will d/c hs coverage and use SSI for now             --Resume metformin  in am and d/c NPH to help transition to home regimen. -Can use Libre to monitor CBGs  5/2- will get pt back on some of home dose and titrate over-  -5/3-4/25 CBGs pretty good, cont regimen as is for now.  5/5- CBG's running 180's to 220's- doing better- con't to monitor  5/6- Bgs doing better- 159-204- will con't regimen and monitor trend CBG (last 3)  Recent Labs    03/11/24 0531 03/11/24 2053 03/12/24 0621  GLUCAP 180* 204* 159*    9.  ABLA: Has had drop in Hgb from 14.0 to 11.2 due to post op anemia             --monitor H/H for trend. Monitor for signs of bleeding. 5/2- Hb 10.7- will monitor for bleeding- incision shows scant drainage, but not enough to explain drop- likely due to not being checked for a few days  5/5- Hb up to 11k 10. Leucocytosis: WBC up to 13.3--likely reactive.  Monitor for fevers and other signs of infection  5/2- WBC down to 10.4k- monitor clinically  5/5- WBC up to 12.2 k- this AM- no Sx's of illness per pt- will monitor clinically.              --monitor drainage.  11. Hx of  DOE:  Echo 02/27/24 showed Moderate to severe concentric hypertrophy with EF 45-50% and mild LV hypokinesis. --hx of mild asymptomatic tachycardia per PCP notes. Cont metoprolol  XL 25mg  daily.  12. 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 Rozelle Corning 15. Morbid obesity-BMI 48.46: RD consult for dietary education.  --Encourage/educate on importance of diet and wt loss to help promote health and mobility.  16. Urinary retention- came to use with Foley catheter- will remove in AM and do bladder scans to make sure is emptying-cath if volumes >350cc with coude' 5/2- removed foley this AM- hasn't been 6 hours yet, but in 1-2 hours, will need to be cathed if hasn't voided -03/09/24 low PVRs overnight, monitor. Continue flomax  0.4mg  nightly 5/5- peeing well- con't regimen 5/6- no issues- will stop bladder scans 17. Constipation- LBM 5 days ago- pt refuses Sorbitol  because scared of diarrhea/accidents. Continue miralax  BID and senokot 2 tabs qAM 5/2- no BM yet- if no BM by tomorrow- needs to get cleaned out some way over  weekend -03/09/24 LBM this morning, large type 4; monitor -03/10/24 LBM yesterday several times, mushy, will decrease senokot to 1 tab daily, monitor  5/5- LBM overnight- going better  5/6- LBM yesterday per pt  18. GI PPx: Protonix  20mg  daily    I spent a total of  41  minutes on total care today- >50% coordination of care- due to  D/w PA about lovenox as well as team conference to determine length of stay-     LOS: 5 days A FACE TO FACE EVALUATION WAS PERFORMED  Merek Niu 03/12/2024,  9:43 AM

## 2024-03-12 NOTE — Progress Notes (Signed)
 Physical Therapy Session Note  Patient Details  Name: Christian [name] MRN: [id number] Date of Birth: [birth year]  Today's Date: 03/12/2024 PT Individual Time: 0800-0900 PT Individual Time Calculation (min): 60 min   Short Term Goals: Week 1:  PT Short Term Goal 1 (Week 1): pt will ambulate 100 ft with LRAD PT Short Term Goal 2 (Week 1): Pt will perform STS with CGA PT Short Term Goal 3 (Week 1): Pt will maintain back precautions without cue throughout sessions.  Skilled Therapeutic Interventions/Progress Updates: Pt presents semi-reclined in bed finishing breakfast, and agreeable to therapy.  Pt transfers sup to sit w/ CGA.  LSO donned in sitting.  Pt states need for BR.  Pt transfers sit to stand w/ min A and amb into BR w/ min/CGA, cues for posture.  Pt requires max A for clothing management when pulling up 2/2 pant falling and spinal precautions.  Pt amb to sink for handwashing.  Pt required seated rest in w/c 2/2 pain.  Pt amb x 90' w/ RW and min A, cueing for posture, walker management.  Pt returned to room and amb from hallway into recliner.  Chair alarm on and all needs in reach.     Therapy Documentation Precautions:  Precautions Precautions: Fall, Back Precaution/Restrictions Comments: LSO, able to recall precautions Required Braces or Orthoses: Spinal Brace Spinal Brace: Lumbar corset, Applied in sitting position Restrictions Weight Bearing Restrictions Per Provider Order: No General:   Vital Signs:   Pain:6/10 w/ activity LB, 0/10 when supine.     Therapy/Group: Individual Therapy  Taysean Wager P Shanena Pellegrino 03/12/2024, 9:07 AM

## 2024-03-12 NOTE — Progress Notes (Signed)
 Occupational Therapy Session Note  Patient Details  Name: Christian [name] MRN: [id number] Date of Birth: April 24, 1951  Today's Date: 03/12/2024 OT Individual Time: 1300-1419 OT Individual Time Calculation (min): 79 min    Short Term Goals: Week 1:  OT Short Term Goal 1 (Week 1): LTG=STG  Skilled Therapeutic Interventions/Progress Updates:      Therapy Documentation Precautions:  Precautions Precautions: Fall, Back Precaution/Restrictions Comments: LSO, able to recall precautions Required Braces or Orthoses: Spinal Brace Spinal Brace: Lumbar corset, Applied in sitting position Restrictions Weight Bearing Restrictions Per Provider Order: No General: "I am working through it today" Pt seated in recliner upon OT arrival, agreeable to OT.  Pain:  6/10 pain reported in low back, activity, intermittent rest breaks, distractions provided for pain management, pt reports tolerable to proceed.   ADL: OT providing skilled intervention on ADL retraining in order to increase independence with tasks and increase activity tolerance. Pt completed the following tasks at the current level of assist: Toilet transfer: SBA overall, increased time for sit to stand with use of grab bar, able to ambulate from recliner to toilet with BSC over top with RW Toileting: Min A, assistance with peri care after BM, charted in I/O, pt able to manage pants on/off waist UB dressing: Min A, OT providing assistance over head d/t shoulder limitations LB dressing: Min A, required assistance over buttocks d/t shoulder limitations Footwear: SBA to slip on shoes in standing Shower transfer: SBA with RW ambulating from toilet  Bathing: SBA overall, OT issuing long handle sponge for increased independence with LB hygiene, OT noted increased independence Transfers: required Min A to stand from recliner to complete LB dressing d/t fatigue    Pt seated in recliner at end of session with W/C alarm donned, call light within  reach and 4Ps assessed.    Therapy/Group: Individual Therapy  Nila Barth, OTD, OTR/L 03/12/2024, 3:36 PM

## 2024-03-12 NOTE — Progress Notes (Signed)
 Patient ID: Christian [name], male   DOB: 02-Aug-1951, 73 y.o.   MRN: [id number]  Met with pt to inform team conference goals of supervision-CGA level and target discharge date of 5/13. He feels he will be ready by then. Discussed OP and rolling walker he reports [facility] is closer to them and agrees with rolling walker. Will make referral to [facility] and they will reach out to contact him to schedule follow up. Will order wide rolling walker for home.

## 2024-03-12 NOTE — Progress Notes (Signed)
 Physical Therapy Session Note  Patient Details  Name: Christian [name] MRN: [id number] Date of Birth: October 30, 1951  Today's Date: 03/12/2024 PT Individual Time: 1116-1200 PT Individual Time Calculation (min): 44 min   Short Term Goals: Week 1:  PT Short Term Goal 1 (Week 1): pt will ambulate 100 ft with LRAD PT Short Term Goal 2 (Week 1): Pt will perform STS with CGA PT Short Term Goal 3 (Week 1): Pt will maintain back precautions without cue throughout sessions.  Skilled Therapeutic Interventions/Progress Updates:      Pt seated in recliner upon arrival. Therapist present to make up missed minutes. Pt agreeable to therapy. Pt reports 7/10 pain in L knee and back, premedicated. Therapist provided rest breaks and repositioning as needed.   Pt wearing LSO.   Pt able to provide teach back of 2/3 precautions (pt needed assistance to recall no bending). Education rpovided regarding differentiation between no bending versus no twisting.    Pt performed sit to stand throughout session with RW and CGA/min A, verbal cues provided for technique with emphasis on anterior weight shift for power up. Pt provided for use of B UE for controlled descent.   Pt stood and performed standing marching 2x10 with RW and CGA, verbal cues provided for upright posture.   Pt ambulated 2x58 feet with RW and CGA with WC in tow, pt requied seated rest breaks for fatigue, verbal cues provided for upright posture and forward gaze, and for en block turn with navigating turns for maintenance of spinal precautions.   Pt seated in recliner with all needs within reach and seatbelt alarm on.       Therapy Documentation Precautions:  Precautions Precautions: Fall, Back Precaution/Restrictions Comments: LSO, able to recall precautions Required Braces or Orthoses: Spinal Brace Spinal Brace: Lumbar corset, Applied in sitting position Restrictions Weight Bearing Restrictions Per Provider Order: No  Therapy/Group:  Individual Therapy  [facility] [place], [place], DPT  03/12/2024, 11:22 AM

## 2024-03-12 NOTE — Evaluation (Signed)
 Speech Language Pathology Assessment and Plan  Patient Details  Name: Christian [name] MRN: [id number] Date of Birth: 29-Dec-1950  SLP Diagnosis:    Rehab Potential:   ELOS:     Today's Date: 03/12/2024 SLP Individual Time: 1308-6578 SLP Individual Time Calculation (min): 58 min   Hospital Problem: Principal Problem:   Neurogenic claudication due to lumbar spinal stenosis  Past Medical History:  Past Medical History:  Diagnosis Date   Arthritis of left knee    Chronic back pain 09/13/2013   Chronic pain syndrome 09/13/2013   Diabetic ulcer of right ankle (HCC)    GERD (gastroesophageal reflux disease)    hx. of   Gout 09/13/2013   Hemorrhoids    Hypertension    Morbid obesity with body mass index of 45.0-49.9 in adult [facility]) 09/13/2013   Osteoarthritis 09/13/2013   Pneumonia    hx. of   Seizures (HCC)    Sleep apnea    has never used C-pap machine due to insurance cost   Type 1 diabetes mellitus, uncontrolled    Past Surgical History:  Past Surgical History:  Procedure Laterality Date   broken toe 35 years ago     COLONOSCOPY WITH PROPOFOL  N/A 12/15/2022   Procedure: COLONOSCOPY WITH PROPOFOL ;  Surgeon: Luke Salaam, MD;  Location: [facility] ENDOSCOPY;  Service: Gastroenterology;  Laterality: N/A;   IR EMBO ARTERIAL NOT HEMORR HEMANG INC GUIDE ROADMAPPING  04/27/2023   IR RADIOLOGIST EVAL & MGMT  03/29/2023   IR RADIOLOGIST EVAL & MGMT  08/02/2023   TOTAL KNEE ARTHROPLASTY Right 05/01/2015   Procedure: RIGHT TOTAL KNEE ARTHROPLASTY;  Surgeon: Arnie Lao, MD;  Location: [facility] ORS;  Service: Orthopedics;  Laterality: Right;   WISDOM TOOTH EXTRACTION      Assessment / Plan / Recommendation Clinical Impression  Christian [name] is a 73 year old R handed  male with history of T2DM with peripheral neuropathy, HTN, morbid obesity with BMI-46, OA bilateral knees- Left> right, , left shoulder pain due to impingement, And RTC injuries, lumbar stenosis with radiculopathy RLE  and neurogenic claudication who elected to undergo L4-S1 laminectomy with trans-foraminal interbody fusion by Dr. Faylene Hoots at [facility] on 03/04/24. Post op reported to have improvement in radiculopathy but continues to be limited by pain and weakness. Hemovac removed on 04/29 and to wear LSO when out of bed. Post op with ABLA and rise in WBC felt to be reactive in nature. He was independent prior to admission but has been limited to bed due to pain for 6 weeks PTA. He was able to furniture walk or use cane for short distances and needed assistance to get in tub/tub seat. Uses electric scooter when out of home.    Cognitive/ Linguistic: The [employer] Mental Status Exam (SLUMS) administered with pt scoring a 27/30 [facility] >26). Strengths noted in orientation, problem solving, immediate and delayed recall, and executive function. Informally, pt able to verbalize recent medical hx as well as PLOF. PTA he was independent with medication management, finances, and reported recent completion of taxes. Pt demonstrated no overt cognitive linguistic deficits at this time. No further cognitive-linguistic needs are indicated at this time.   Skilled Therapeutic Interventions          No further cognitive-linguistic needs are indicated at this time.  SLP Assessment  Patient does not need any further Speech Lanaguage Pathology Services    Recommendations  Patient destination: Home Follow up Recommendations: None Equipment Recommended: None recommended by  SLP    SLP Frequency     SLP Duration  SLP Intensity  SLP Treatment/Interventions            Pain Pain Assessment Pain Scale: 0-10 Pain Score: 5  Pain Location: Back Pain Orientation: Left Pain Descriptors / Indicators: Aching Pain Intervention(s): Medication (See eMAR)  Prior Functioning Cognitive/Linguistic Baseline: Within functional limits Type of Home: House  Lives With: Spouse Available Help at Discharge:  Family;Available 24 hours/day Vocation: Retired  Architectural technologist Overall Cognitive Status: Within Functional Limits for tasks assessed Arousal/Alertness: Awake/alert Orientation Level: Oriented X4 Year: 2025 Month: May Day of Week: Correct Attention: Focused;Sustained;Selective Focused Attention: Appears intact Sustained Attention: Appears intact Selective Attention: Appears intact Memory: Appears intact Awareness: Appears intact Problem Solving: Appears intact Safety/Judgment: Appears intact  Comprehension Auditory Comprehension Overall Auditory Comprehension: Appears within functional limits for tasks assessed Expression Expression Primary Mode of Expression: Verbal Verbal Expression Overall Verbal Expression: Appears within functional limits for tasks assessed Written Expression Dominant Hand: Right Oral Motor Oral Motor/Sensory Function Overall Oral Motor/Sensory Function: Within functional limits Motor Speech Overall Motor Speech: Appears within functional limits for tasks assessed  Care Tool Care Tool Cognition Ability to hear (with hearing aid or hearing appliances if normally used Ability to hear (with hearing aid or hearing appliances if normally used): 0. Adequate - no difficulty in normal conservation, social interaction, listening to TV   Expression of Ideas and Wants Expression of Ideas and Wants: 4. Without difficulty (complex and basic) - expresses complex messages without difficulty and with speech that is clear and easy to understand   Understanding Verbal and Non-Verbal Content Understanding Verbal and Non-Verbal Content: 4. Understands (complex and basic) - clear comprehension without cues or repetitions  Memory/Recall Ability Memory/Recall Ability : Current season;Location of own room;Staff names and faces;That he or she is in a hospital/hospital unit   Oral Care Assessment Oral Assessment  (WDL): Within Defined Limits Teeth: Intact Tongue:  Pink;Moist Mucous Membrane(s): Moist;Pink Level of Consciousness: Alert Is patient on any of following O2 devices?: None of the above Nutritional status: No high risk factors Oral Assessment Risk : Low Risk   Short Term Goals: Week 1:    Refer to Care Plan for Long Term Goals  Recommendations for other services: None   Discharge Criteria: Patient will be discharged from SLP if patient refuses treatment 3 consecutive times without medical reason, if treatment goals not met, if there is a change in medical status, if patient makes no progress towards goals or if patient is discharged from hospital.  The above assessment, treatment plan, treatment alternatives and goals were discussed and mutually agreed upon: by patient  Adela Holter 03/12/2024, 12:03 PM

## 2024-03-12 NOTE — Progress Notes (Signed)
 Occupational Therapy Session Note  Patient Details  Name: Christian [name] MRN: [id number] Date of Birth: 03/03/1951  Today's Date: 03/12/2024 OT Individual Time: 4098-1191 OT Individual Time Calculation (min): 23 min  and Today's Date: 03/12/2024 OT Missed Time: 22 Minutes Missed Time Reason: Patient fatigue;Pain   Short Term Goals: Week 1:  OT Short Term Goal 1 (Week 1): LTG=STG  Skilled Therapeutic Interventions/Progress Updates:  Skilled OT intervention completed with focus on pain management and repositioning education. Pt received slightly reclined in recliner. Pt was huffing and puffing in pain- reporting location of lumbar spine and R shoulder. Pt also reporting significant fatigue, originally declining therapy.  OT offered education to pt about positioning for comfort as pt reclined back in posterior pelvic tilt, thus increasing pressure on lumbar/sacral regions of spine. Advised pt that compression relief is often achieved by mobility, transitioning to supine position to off load the vertebrae or using pillows to support sitting in recliner vs leaning back. Pt opted to trial pillow method. 2 pillows applied for support, and ice applied to incisional region.   Discussed ROM exercises including table slides that pt could do to keep BUE mobile when gravity is eliminated to reduce pain however pt declined trial with it. Agreeable to topical hot packs; donned dependently to R shoulder and reinforced with ACE wrap make shift sling.  Pt remained seated in recliner, with chair alarm on/activated, and with all needs in reach at end of session. Pt missed 22 mins of OT intervention secondary to pain/fatigue; OT will make up missed time as able.    Therapy Documentation Precautions:  Precautions Precautions: Fall, Back Precaution/Restrictions Comments: LSO, able to recall precautions Required Braces or Orthoses: Spinal Brace Spinal Brace: Lumbar corset, Applied in sitting  position Restrictions Weight Bearing Restrictions Per Provider Order: No    Therapy/Group: Individual Therapy  Ruthanna Covert, MS, OTR/L  03/12/2024, 3:17 PM

## 2024-03-13 DIAGNOSIS — M48062 Spinal stenosis, lumbar region with neurogenic claudication: Secondary | ICD-10-CM | POA: Diagnosis not present

## 2024-03-13 LAB — GLUCOSE, CAPILLARY
Glucose-Capillary: 168 mg/dL — ABNORMAL HIGH (ref 70–99)
Glucose-Capillary: 220 mg/dL — ABNORMAL HIGH (ref 70–99)
Glucose-Capillary: 253 mg/dL — ABNORMAL HIGH (ref 70–99)
Glucose-Capillary: 190 mg/dL — ABNORMAL HIGH (ref 70–99)

## 2024-03-13 NOTE — Progress Notes (Signed)
 Physical Therapy Session Note  Patient Details  Name: Christian [name] MRN: [id number] Date of Birth: 1951/11/04  Today's Date: 03/13/2024 PT Individual Time: 1101-1126 PT Individual Time Calculation (min): 25 min   Short Term Goals: Week 1:  PT Short Term Goal 1 (Week 1): pt will ambulate 100 ft with LRAD PT Short Term Goal 2 (Week 1): Pt will perform STS with CGA PT Short Term Goal 3 (Week 1): Pt will maintain back precautions without cue throughout sessions.  Skilled Therapeutic Interventions/Progress Updates:   Received pt sitting in recliner. Pt agreeable to PT treatment but reported pain 8/10 in low back - RN notified and present to adminster medications. Requested order for K-pad from MD and placed heat packs on low back for pain relief. Attempted numerous times to stand from recliner with RW, however pt unable due to pain. Performed seated heel raises x20, then engaged with pet therapy dog Dixie to uplift mood/spirits. Concluded session with pt sitting in recliner, needs within reach, and chair pad alarm on. RN at bedside.   Therapy Documentation Precautions:  Precautions Precautions: Fall, Back Precaution/Restrictions Comments: LSO, able to recall precautions Required Braces or Orthoses: Spinal Brace Spinal Brace: Lumbar corset, Applied in sitting position Restrictions Weight Bearing Restrictions Per Provider Order: No  Therapy/Group: Individual Therapy Nicolas Barren Zaunegger Nena Bank PT, DPT 03/13/2024, 7:14 AM

## 2024-03-13 NOTE — Progress Notes (Signed)
 Occupational Therapy Session Note  Patient Details  Name: Christian [name] MRN: [id number] Date of Birth: 07-05-1951  Today's Date: 03/13/2024 OT Individual Time: 1430-1535 OT Individual Time Calculation (min): 65 min    Short Term Goals: Week 1:  OT Short Term Goal 1 (Week 1): LTG=STG  Skilled Therapeutic Interventions/Progress Updates:      Therapy Documentation Precautions:  Precautions Precautions: Fall, Back Precaution/Restrictions Comments: LSO, able to recall precautions Required Braces or Orthoses: Spinal Brace Spinal Brace: Lumbar corset, Applied in sitting position Restrictions Weight Bearing Restrictions Per Provider Order: No General: "Thank you!" Pt seated in recliner upon OT arrival, agreeable to OT. Wife and brother present.   Pain: unrated pain reported in low back and bilateral shoulders positioning, activity, intermittent rest breaks, distractions provided for pain management, pt reports tolerable to proceed. Pt on kpad on low back, reports decreased pain from earlier.  Other Treatments: OT applied KT tape to bilateral shoulders in order to provide stability and decrease pain with use. Pt educated on risks and benefits of tape and OT educated to call nsg if skin irritated to remove taping. OT noted increased atrophy of deltoid musculature in Lt deltoid vs Rt. OT encouraged stretching for decreased muscle tightness.    Pt seated in recliner at end of session with W/C alarm donned, call light within reach and 4Ps assessed. Family member present   Therapy/Group: Individual Therapy  Nila Barth, OTD, OTR/L 03/13/2024, 3:51 PM

## 2024-03-13 NOTE — Progress Notes (Signed)
 Physical Therapy Session Note  Patient Details  Name: Christian [name] MRN: [id number] Date of Birth: [birth year]  Today's Date: 03/13/2024 PT Individual Time: 0900-0930 PT Individual Time Calculation (min): 30 min   Short Term Goals: Week 1:  PT Short Term Goal 1 (Week 1): pt will ambulate 100 ft with LRAD PT Short Term Goal 2 (Week 1): Pt will perform STS with CGA PT Short Term Goal 3 (Week 1): Pt will maintain back precautions without cue throughout sessions.  Skilled Therapeutic Interventions/Progress Updates: Pt presented in recliner agreeable to therapy with encouragement. Pt states pain 7/10, premedicated. Rest and repositioning provided during session. Pt voiced frustration regarding belt alarm, pt agreeable to perform Sit to stand requiring several attempts and ultimate minA from recliner. Pt c/o increased pain in L knee with standing. Participated in seated LE therex as tolerated including ankle pumps, LAQ (WLP) and seated hip flexion to fatigue. Pt left seated in recliner at end of session with call bell within reach and needs met.      Therapy Documentation Precautions:  Precautions Precautions: Fall, Back Precaution/Restrictions Comments: LSO, able to recall precautions Required Braces or Orthoses: Spinal Brace Spinal Brace: Lumbar corset, Applied in sitting position Restrictions Weight Bearing Restrictions Per Provider Order: No General:   Vital Signs: Therapy Vitals Pulse Rate: 93 Resp: 19 BP: 114/80 Patient Position (if appropriate): Lying Oxygen Therapy SpO2: 97 % O2 Device: Room Air   Therapy/Group: Individual Therapy  Nazly Digilio 03/13/2024, 4:30 PM

## 2024-03-13 NOTE — Plan of Care (Signed)
  Problem: Sit to Stand Goal: LTG:  Patient will perform sit to stand in prep for activites of daily living with assistance level (OT) Description: LTG:  Patient will perform sit to stand in prep for activites of daily living with assistance level (OT) Flowsheets (Taken 03/13/2024 1630) LTG: PT will perform sit to stand in prep for activites of daily living with assistance level: (upgraded 2/2 progress) Supervision/Verbal cueing   Problem: RH Dressing Goal: LTG Patient will perform upper body dressing (OT) Description: LTG Patient will perform upper body dressing with assist, with/without cues (OT). Flowsheets (Taken 03/13/2024 1630) LTG: Pt will perform upper body dressing with assistance level of: (downgraded d/t shoulder limitations) Minimal Assistance - Patient > 75%   Problem: RH Toileting Goal: LTG Patient will perform toileting task (3/3 steps) with assistance level (OT) Description: LTG: Patient will perform toileting task (3/3 steps) with assistance level (OT)  Flowsheets (Taken 03/13/2024 1630) LTG: Pt will perform toileting task (3/3 steps) with assistance level: (upgraded 2/2 progress) Minimal Assistance - Patient > 75%   Problem: RH Tub/Shower Transfers Goal: LTG Patient will perform tub/shower transfers w/assist (OT) Description: LTG: Patient will perform tub/shower transfers with assist, with/without cues using equipment (OT) Flowsheets (Taken 03/13/2024 1630) LTG: Pt will perform tub/shower stall transfers with assistance level of: (upgraded 2/2 progress) Supervision/Verbal cueing

## 2024-03-13 NOTE — Progress Notes (Signed)
 Physical Therapy Session Note  Patient Details  Name: Christian [name] MRN: [id number] Date of Birth: [birth year]  Today's Date: 03/13/2024 PT Individual Time: 0815-0900 PT Individual Time Calculation (min): 45 min   Short Term Goals: Week 1:  PT Short Term Goal 1 (Week 1): pt will ambulate 100 ft with LRAD PT Short Term Goal 2 (Week 1): Pt will perform STS with CGA PT Short Term Goal 3 (Week 1): Pt will maintain back precautions without cue throughout sessions.  Skilled Therapeutic Interventions/Progress Updates: Pt presents sitting in recliner, finishing breakfast.  Nursing present for morning med , Missed PT time of 15'.  Pt agreeable to therapy, but increased pain today.  Pt transferred sit to stand w/ mod A and increased attempts to transfer.  Pt amb short distance to w/c and wheeled to small gym 2/2 pain.  Pt performed sit to stand transfers x 3 but c/o lightheadedness.  Pt BP in sitting after standing 126/81, HR 107.  Pt unable to remained standing long enough for standing BP.  Pt performed calf raises in sitting.  Pt returned to room and amb x 3' to recliner.  Pt remained sitting w/ chair alarm on and all needs in reach.     Therapy Documentation Precautions:  Precautions Precautions: Fall, Back Precaution/Restrictions Comments: LSO, able to recall precautions Required Braces or Orthoses: Spinal Brace Spinal Brace: Lumbar corset, Applied in sitting position Restrictions Weight Bearing Restrictions Per Provider Order: No General: PT Amount of Missed Time (min): 15 Minutes PT Missed Treatment Reason: Nursing care Vital Signs:   Pain:8/10 Pain Assessment Pain Scale: 0-10 Pain Score: 0-No pain Pain Location: Back Pain Intervention(s): Medication (See eMAR)    Therapy/Group: Individual Therapy  [name] 03/13/2024, 9:05 AM

## 2024-03-13 NOTE — Progress Notes (Signed)
 PROGRESS NOTE   Subjective/Complaints:  Pt reports slept great- very hard again- no issues this AM.  LBM last night Said they changed his dressing on back yesterday  ROS:    Pt denies SOB, abd pain, CP, N/V/C/D, and vision changes    Objective:   No results found. Recent Labs    03/11/24 0537  WBC 12.2*  HGB 11.0*  HCT 33.7*  PLT 276    Recent Labs    03/11/24 0537  NA 137  K 4.0  CL 101  CO2 26  GLUCOSE 169*  BUN 17  CREATININE 0.78  CALCIUM 8.9     Intake/Output Summary (Last 24 hours) at 03/13/2024 0835 Last data filed at 03/12/2024 1835 Gross per 24 hour  Intake 420 ml  Output --  Net 420 ml        Physical Exam: Vital Signs Blood pressure 124/76, pulse (!) 105, temperature 98 F (36.7 C), temperature source Oral, resp. rate 18, height 5\' 6"  (1.676 m), weight (!) 136.2 kg, SpO2 93%.      General: awake, alert, appropriate, just woke up- trying to boost self in bed to eat; NAD HENT: conjugate gaze; oropharynx moist CV: regular rhythm, mildly tachycardic rate; no JVD Pulmonary: CTA B/L; no W/R/R- good air movement GI: soft, protuberant- BMI 48; NT, ND; normoactive Psychiatric: appropriate Neurological: Ox3 Skin- Has new tegaderm dressing on back- C/D/I Neurological: Ox3 Extremities 0- trace LE edema B/L   PRIOR EXAMS: Musculoskeletal:     Cervical back: Neck supple. No tenderness.     Right lower leg: No edema.     Comments: Deltoids 2-/5 due to probable RTC tears; biceps 4+/5 on R 5-/5 on L; Triceps 4+/5 on R and 5-/5 on L; Grip and FA 4+/5 on R and 5-/5 on L- but very limited by having to do hand walking up body for shoulders LE's- RLE- HF 4-/5; KE 4+/5; DF/PF 5-/5 LLE- HF 2-/5; cannot extend or flex L knee due to "bad L knee"; 4+/5 DF and PF Literally cannot extend L knee past 45 degrees, but says can when standing with assistive device  Skin:    General: Skin is warm and dry.      Comments: Incision with some sanguinous drainage- tegaderm placed with dressing underneath it  Neurological:     Mental Status: He is alert and oriented to person, place, and time.     Comments: Decreased to light touch B/L at midcalf- worse on RLE to light touch and pinprick- absent to pinprick on bottom of feet   Assessment/Plan: 1. Functional deficits which require 3+ hours per day of interdisciplinary therapy in a comprehensive inpatient rehab setting. Physiatrist is providing close team supervision and 24 hour management of active medical problems listed below. Physiatrist and rehab team continue to assess barriers to discharge/monitor patient progress toward functional and medical goals  Care Tool:  Bathing        Body parts bathed by helper: Right arm, Left upper leg, Left arm, Right lower leg, Chest, Abdomen, Left lower leg, Front perineal area, Buttocks, Right upper leg     Bathing assist Assist Level: Total Assistance - Patient < 25%  Upper Body Dressing/Undressing Upper body dressing   What is the patient wearing?: Pull over shirt    Upper body assist Assist Level: Total Assistance - Patient < 25%    Lower Body Dressing/Undressing Lower body dressing      What is the patient wearing?: Underwear/pull up, Pants     Lower body assist Assist for lower body dressing: Total Assistance - Patient < 25%     Toileting Toileting Toileting Activity did not occur Press photographer and hygiene only): N/A (no void or bm)  Toileting assist Assist for toileting: Maximal Assistance - Patient 25 - 49%     Transfers Chair/bed transfer  Transfers assist     Chair/bed transfer assist level: Minimal Assistance - Patient > 75%     Locomotion Ambulation   Ambulation assist   Ambulation activity did not occur: Safety/medical concerns  Assist level: Minimal Assistance - Patient > 75% Assistive device: Walker-rolling Max distance: 90   Walk 10 feet  activity   Assist  Walk 10 feet activity did not occur: Safety/medical concerns  Assist level: Minimal Assistance - Patient > 75% Assistive device: Walker-rolling   Walk 50 feet activity   Assist Walk 50 feet with 2 turns activity did not occur: Safety/medical concerns  Assist level: Minimal Assistance - Patient > 75% Assistive device: Walker-rolling    Walk 150 feet activity   Assist Walk 150 feet activity did not occur: Safety/medical concerns         Walk 10 feet on uneven surface  activity   Assist Walk 10 feet on uneven surfaces activity did not occur: Safety/medical concerns         Wheelchair     Assist Is the patient using a wheelchair?: Yes Type of Wheelchair: Manual Wheelchair activity did not occur: Safety/medical concerns         Wheelchair 50 feet with 2 turns activity    Assist    Wheelchair 50 feet with 2 turns activity did not occur: Safety/medical concerns       Wheelchair 150 feet activity     Assist  Wheelchair 150 feet activity did not occur: Safety/medical concerns       Blood pressure 124/76, pulse (!) 105, temperature 98 F (36.7 C), temperature source Oral, resp. rate 18, height 5\' 6"  (1.676 m), weight (!) 136.2 kg, SpO2 93%.  Medical Problem List and Plan: 1. Functional deficits secondary to severe lumbar stenosis with B/L LE radiculopathy s/p PSF at L4-S1             -patient may  shower -ELOS/Goals: 10-12 days min A- was using scooter outside home prior- had been bedbound for 6 weeks prior to admission            D/c 5/13  Con't CIR PT and OT- wife worried that  wasn't doing great at home and anxious about d/c- per SW, have spoken with her and explained will need RW at home.  2.  Antithrombotics: -DVT/anticoagulation:  Mechanical: Sequential compression devices, entire leg Bilateral lower extremities -will need to check with surgeon at 7 days s/p surgery when can start DVT prophylaxis 5/2- will call Surgeon  Monday to get ok to put on Lovenox 5/5- asked PA to get ahold of surgeon to start lovenox 5/6- will d/w PA about getting ahold of surgeon- if we cannot, it's been enough time, will start Lovenox- will let pharmacy dose- for prophyalxis- has been 9 days since surgery             -  antiplatelet therapy: ASA 81mg  daily 3. Pain Management:  tylenol  650mg  QID. Voltaren  gel QID. Cymbalta  60mg  daily. Hydrocodone  ineffective--will change to oxycodone  which he has tried in the past. 4. Mood/Behavior/Sleep: LCSW to follow for evaluation and support.             -antipsychotic agents: N/A 5. Neuropsych/cognition: This patient is capable of making decisions on his own behalf. 6. Skin/Wound Care: Routine pressure relief measures.              --monitor drainage from wound. Dry dressing daily.  7. Fluids/Electrolytes/Nutrition: Monitor I/O and routine labs  8. T2DM w/neuropathy: Hgb A1c-5.9 and better controlled. On Novolin 100 units am/30 units pm prn BS>190 and Metformin  2000 mg daily am.  --Monitor BS ac/hs. Followed by Dr. Marianna Shirk --Currently on NPH 10 units bid w/novolog  6 units ac TID/HS?             --will d/c hs coverage and use SSI for now             --Resume metformin  in am and d/c NPH to help transition to home regimen. -Can use Libre to monitor CBGs  5/2- will get pt back on some of home dose and titrate over-  -5/3-4/25 CBGs pretty good, cont regimen as is for now.  5/5- CBG's running 180's to 220's- doing better- con't to monitor  5/6- Bgs doing better- 159-204- will con't regimen and monitor trend 5/7- doing better daily-  CBG (last 3)  Recent Labs    03/12/24 1641 03/12/24 2105 03/13/24 0610  GLUCAP 172* 166* 168*    9.  ABLA: Has had drop in Hgb from 14.0 to 11.2 due to post op anemia             --monitor H/H for trend. Monitor for signs of bleeding. 5/2- Hb 10.7- will monitor for bleeding- incision shows scant drainage, but not enough to explain drop- likely due to not  being checked for a few days  5/5- Hb up to 11k 10. Leucocytosis: WBC up to 13.3--likely reactive. Monitor for fevers and other signs of infection  5/2- WBC down to 10.4k- monitor clinically  5/5- WBC up to 12.2 k- this AM- no Sx's of illness per pt- will monitor clinically. 5/7- will recheck Thursday- incision not really draining             11. Hx of  DOE:  Echo 02/27/24 showed Moderate to severe concentric hypertrophy with EF 45-50% and mild LV hypokinesis. --hx of mild asymptomatic tachycardia per PCP notes. Cont metoprolol  XL 25mg  daily.  12. 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 Rozelle Corning 15. Morbid obesity-BMI 48.46: RD consult for dietary education.  --Encourage/educate on importance of diet and wt loss to help promote health and mobility.  16. Urinary retention- came to use with Foley catheter- will remove in AM and do bladder scans to make sure is emptying-cath if volumes >350cc with coude' 5/2- removed foley this AM- hasn't been 6 hours yet, but in 1-2 hours, will need to be cathed if hasn't voided -03/09/24 low PVRs overnight, monitor. Continue flomax  0.4mg  nightly 5/5- peeing well- con't regimen 5/6- no issues- will stop bladder scans 17. Constipation- LBM 5 days ago- pt refuses Sorbitol  because scared of diarrhea/accidents. Continue miralax  BID and senokot 2  tabs qAM 5/7- LBM last night  18. GI PPx: Protonix  20mg  daily     LOS: 6 days A FACE TO FACE EVALUATION WAS PERFORMED  Ashwin Tibbs 03/13/2024, 8:35 AM

## 2024-03-14 DIAGNOSIS — M48062 Spinal stenosis, lumbar region with neurogenic claudication: Secondary | ICD-10-CM | POA: Diagnosis not present

## 2024-03-14 LAB — CBC
HCT: 34.3 % — ABNORMAL LOW (ref 39.0–52.0)
Hemoglobin: 11.3 g/dL — ABNORMAL LOW (ref 13.0–17.0)
MCH: 32.7 pg (ref 26.0–34.0)
MCHC: 32.9 g/dL (ref 30.0–36.0)
MCV: 99.1 fL (ref 80.0–100.0)
Platelets: 287 10*3/uL (ref 150–400)
RBC: 3.46 MIL/uL — ABNORMAL LOW (ref 4.22–5.81)
RDW: 13.6 % (ref 11.5–15.5)
WBC: 10.9 10*3/uL — ABNORMAL HIGH (ref 4.0–10.5)
nRBC: 0 % (ref 0.0–0.2)

## 2024-03-14 LAB — BASIC METABOLIC PANEL WITH GFR
Anion gap: 9 (ref 5–15)
BUN: 21 mg/dL (ref 8–23)
CO2: 25 mmol/L (ref 22–32)
Calcium: 8.8 mg/dL — ABNORMAL LOW (ref 8.9–10.3)
Chloride: 104 mmol/L (ref 98–111)
Creatinine, Ser: 0.87 mg/dL (ref 0.61–1.24)
GFR, Estimated: >60 mL/min (ref >60)
Glucose, Bld: 199 mg/dL — ABNORMAL HIGH (ref 70–99)
Potassium: 4.2 mmol/L (ref 3.5–5.1)
Sodium: 138 mmol/L (ref 135–145)

## 2024-03-14 LAB — GLUCOSE, CAPILLARY
Glucose-Capillary: 192 mg/dL — ABNORMAL HIGH (ref 70–99)
Glucose-Capillary: 236 mg/dL — ABNORMAL HIGH (ref 70–99)
Glucose-Capillary: 140 mg/dL — ABNORMAL HIGH (ref 70–99)
Glucose-Capillary: 178 mg/dL — ABNORMAL HIGH (ref 70–99)

## 2024-03-14 NOTE — Progress Notes (Signed)
 Physical Therapy Session Note  Patient Details  Name: Christian [name] MRN: [id number] Date of Birth: May 08, 1951  Today's Date: 03/14/2024 PT Individual Time: 1345-1420 PT Individual Time Calculation (min): 35 min   Short Term Goals: Week 1:  PT Short Term Goal 1 (Week 1): pt will ambulate 100 ft with LRAD PT Short Term Goal 2 (Week 1): Pt will perform STS with CGA PT Short Term Goal 3 (Week 1): Pt will maintain back precautions without cue throughout sessions.  Skilled Therapeutic Interventions/Progress Updates: Pt presented in bed agreeable to therapy. Pt states unrated pain in back and LLE but also states well controlled at this time. Session focused on functional transfers and initiation of stair training. Pt completed supine to sit with supervision and use of bed features. Pt able to don socks with supervision and maintain spinal precautions. Pt required minA for donning LSO due to limited shoulder ROM. RW was delivered during this time therefore PTA adjusted to appropriate height and pt completed stand step transfer to w/c with RW and CGA. Pt transported to main gym for time management and energy conservation. Initiated stair training with pt completing ascending/descending x 8 3in steps with B rails and CGA. Pt led with RLE and performed with step to pattern. Per pt would use rail and SPC. Advised should be able to trial before d/c. Pt then agreeable to initiate 6in steps as pt has a combination of lower and taller steps. Pt stood with CGA and ascended/descended x 1 6 in step with CGA and significant increased effort. Pt transported back to room and completed stand step transfer back to bed. Completed sit to supine with supervision and use of bed features. Pt left in bed at end of session with call bell within reach and needs met.      Therapy Documentation Precautions:  Precautions Precautions: Fall, Back Precaution/Restrictions Comments: LSO, able to recall precautions Required Braces  or Orthoses: Spinal Brace Spinal Brace: Lumbar corset, Applied in sitting position Restrictions Weight Bearing Restrictions Per Provider Order: No   Therapy/Group: Individual Therapy  [name] 03/14/2024, 4:36 PM

## 2024-03-14 NOTE — Progress Notes (Signed)
 Occupational Therapy Session Note  Patient Details  Name: Christian [name] MRN: [id number] Date of Birth: 1951/06/06  Today's Date: 03/14/2024 OT Individual Time: 4098-1191 OT Individual Time Calculation (min): 30 min  and Today's Date: 03/14/2024 OT Missed Time: 15 Minutes Missed Time Reason: Other (comment) (eating breakfast)   Short Term Goals: Week 1:  OT Short Term Goal 1 (Week 1): LTG=STG  Skilled Therapeutic Interventions/Progress Updates:    Pt sitting in recliner upon arrival eating breakfast. Pt missed 15 mins at beginning of session to allow pt time to complete breakfast. Skilled OT intervention with focus on discharge planning, DME requirements/recommendations, sit<>stand, standing balance, funcitonal amb with RW, and bed monbility to increase independence with bADLs. Pt repositioning in recliner numerous time 2/2 back discomfort. Pt requested BSC and RW. Recommended TTB but pt states bathroom layout will not accommodate. Sit<>stand with CGA. Amb to EOB with CGA. Sit>supine with supervisoin. Pt remained in bed with all needs within reach. Bed alarm activated.   Therapy Documentation Precautions:  Precautions Precautions: Fall, Back Precaution/Restrictions Comments: LSO, able to recall precautions Required Braces or Orthoses: Spinal Brace Spinal Brace: Lumbar corset, Applied in sitting position Restrictions Weight Bearing Restrictions Per Provider Order: No General: General OT Amount of Missed Time: 15 Minutes Vital Signs:   Pain: Pt reports 4/10 back pain; repositioned and meds admin prior to therapy  Therapy/Group: Individual Therapy  [name] 03/14/2024, 9:07 AM

## 2024-03-14 NOTE — Progress Notes (Signed)
 PROGRESS NOTE   Subjective/Complaints:  Pt reports up early due ot having Christian [name] at 7am- went away so he could eat- ate 100% tray.  LBM yesterday  Sore this AM- stiff leg on RLE- not pain, but radiates stiffness into R low back for some reason.  Kpad worked well for him.   ROS:   Pt denies SOB, abd pain, CP, N/V/C/D, and vision changes     Objective:   No results found. Recent Labs    03/14/24 0617  WBC 10.9*  HGB 11.3*  HCT 34.3*  PLT 287    Recent Labs    03/14/24 0617  NA 138  K 4.2  CL 104  CO2 25  GLUCOSE 199*  BUN 21  CREATININE 0.87  CALCIUM 8.8*     Intake/Output Summary (Last 24 hours) at 03/14/2024 0842 Last data filed at 03/14/2024 0700 Gross per 24 hour  Intake 696 ml  Output --  Net 696 ml        Physical Exam: Vital Signs Blood pressure 129/78, pulse 82, temperature 98 F (36.7 C), temperature source Oral, resp. rate 19, height 5\' 6"  (1.676 m), weight (!) 136.2 kg, SpO2 98%.      General: awake, alert, appropriate, sitting up in bedside chair; 100% tray eaten; NAD HENT: conjugate gaze; oropharynx moist CV: regular rate and rhythm no JVD Pulmonary: CTA B/L; no W/R/R- good air movement GI: soft, NT, ND, (+)BS- protuberant- BMI 48 Psychiatric: appropriate- interactive Neurological: Ox3  Skin- Has new tegaderm dressing on back- C/D/I Neurological: Ox3 Extremities 0- trace LE edema B/L   PRIOR EXAMS: Musculoskeletal:     Cervical back: Neck supple. No tenderness.     Right lower leg: No edema.     Comments: Deltoids 2-/5 due to probable RTC tears; biceps 4+/5 on R 5-/5 on L; Triceps 4+/5 on R and 5-/5 on L; Grip and FA 4+/5 on R and 5-/5 on L- but very limited by having to do hand walking up body for shoulders LE's- RLE- HF 4-/5; KE 4+/5; DF/PF 5-/5 LLE- HF 2-/5; cannot extend or flex L knee due to "bad L knee"; 4+/5 DF and PF Literally cannot extend L knee past 45 degrees, but  says can when standing with assistive device  Skin:    General: Skin is warm and dry.     Comments: Incision with some sanguinous drainage- tegaderm placed with dressing underneath it  Neurological:     Mental Status: He is alert and oriented to person, place, and time.     Comments: Decreased to light touch B/L at midcalf- worse on RLE to light touch and pinprick- absent to pinprick on bottom of feet   Assessment/Plan: 1. Functional deficits which require 3+ hours per day of interdisciplinary therapy in a comprehensive inpatient rehab setting. Physiatrist is providing close team supervision and 24 hour management of active medical problems listed below. Physiatrist and rehab team continue to assess barriers to discharge/monitor patient progress toward functional and medical goals  Care Tool:  Bathing        Body parts bathed by helper: Right arm, Left upper leg, Left arm, Right lower leg, Chest, Abdomen, Left lower  leg, Front perineal area, Buttocks, Right upper leg     Bathing assist Assist Level: Total Assistance - Patient < 25%     Upper Body Dressing/Undressing Upper body dressing   What is the patient wearing?: Pull over shirt    Upper body assist Assist Level: Total Assistance - Patient < 25%    Lower Body Dressing/Undressing Lower body dressing      What is the patient wearing?: Underwear/pull up, Pants     Lower body assist Assist for lower body dressing: Total Assistance - Patient < 25%     Toileting Toileting Toileting Activity did not occur Press photographer and hygiene only): N/A (no void or bm)  Toileting assist Assist for toileting: Maximal Assistance - Patient 25 - 49%     Transfers Chair/bed transfer  Transfers assist     Chair/bed transfer assist level: Minimal Assistance - Patient > 75%     Locomotion Ambulation   Ambulation assist   Ambulation activity did not occur: Safety/medical concerns  Assist level: Minimal Assistance -  Patient > 75% Assistive device: Walker-rolling Max distance: 90   Walk 10 feet activity   Assist  Walk 10 feet activity did not occur: Safety/medical concerns  Assist level: Minimal Assistance - Patient > 75% Assistive device: Walker-rolling   Walk 50 feet activity   Assist Walk 50 feet with 2 turns activity did not occur: Safety/medical concerns  Assist level: Minimal Assistance - Patient > 75% Assistive device: Walker-rolling    Walk 150 feet activity   Assist Walk 150 feet activity did not occur: Safety/medical concerns         Walk 10 feet on uneven surface  activity   Assist Walk 10 feet on uneven surfaces activity did not occur: Safety/medical concerns         Wheelchair     Assist Is the patient using a wheelchair?: Yes Type of Wheelchair: Manual Wheelchair activity did not occur: Safety/medical concerns         Wheelchair 50 feet with 2 turns activity    Assist    Wheelchair 50 feet with 2 turns activity did not occur: Safety/medical concerns       Wheelchair 150 feet activity     Assist  Wheelchair 150 feet activity did not occur: Safety/medical concerns       Blood pressure 129/78, pulse 82, temperature 98 F (36.7 C), temperature source Oral, resp. rate 19, height 5\' 6"  (1.676 m), weight (!) 136.2 kg, SpO2 98%.  Medical Problem List and Plan: 1. Functional deficits secondary to severe lumbar stenosis with B/L LE radiculopathy s/p PSF at L4-S1             -patient may  shower -ELOS/Goals: 10-12 days min A- was using scooter outside home prior- had been bedbound for 6 weeks prior to admission            D/c 5/13  - wife worried that  wasn't doing great at home and anxious about d/c- per SW, have spoken with her and explained will need RW at home.   Con't CIR PT and OT- not happy with 7am therapy today 2.  Antithrombotics: -DVT/anticoagulation:  Mechanical: Sequential compression devices, entire leg Bilateral lower  extremities -will need to check with surgeon at 7 days s/p surgery when can start DVT prophylaxis 5/2- will call Surgeon Monday to get ok to put on Lovenox 5/5- asked PA to get ahold of surgeon to start lovenox 5/6- will d/w PA about getting ahold  of surgeon- if we cannot, it's been enough time, will start Lovenox- will let pharmacy dose- for prophyalxis- has been 9 days since surgery             -antiplatelet therapy: ASA 81mg  daily 3. Pain Management:  tylenol  650mg  QID. Voltaren  gel QID. Cymbalta  60mg  daily. Hydrocodone  ineffective--will change to oxycodone  which he has tried in the past.  5/8- pt reports no real pain this AM- just stiffness- con't regimen, but advised pt to use Robaxin  as needed 4. Mood/Behavior/Sleep: LCSW to follow for evaluation and support.             -antipsychotic agents: N/A 5. Neuropsych/cognition: This patient is capable of making decisions on his own behalf. 6. Skin/Wound Care: Routine pressure relief measures.              --monitor drainage from wound. Dry dressing daily.   5/8- No drainage on dressing 7. Fluids/Electrolytes/Nutrition: Monitor I/O and routine labs  8. T2DM w/neuropathy: Hgb A1c-5.9 and better controlled. On Novolin 100 units am/30 units pm prn BS>190 and Metformin  2000 mg daily am.  --Monitor BS ac/hs. Followed by Dr. Marianna Shirk --Currently on NPH 10 units bid w/novolog  6 units ac TID/HS?             --will d/c hs coverage and use SSI for now             --Resume metformin  in am and d/c NPH to help transition to home regimen. -Can use Libre to monitor CBGs  5/2- will get pt back on some of home dose and titrate over-  -5/3-4/25 CBGs pretty good, cont regimen as is for now.  5/5- CBG's running 180's to 220's- doing better- con't to monitor  5/6- Bgs doing better- 159-204- will con't regimen and monitor trend 5/8- Cbgs running a little higher 190s- 253- on home dose- pt doesn't want to change meds CBG (last 3)  Recent Labs     03/13/24 1653 03/13/24 2059 03/14/24 0606  GLUCAP 253* 190* 192*    9.  ABLA: Has had drop in Hgb from 14.0 to 11.2 due to post op anemia             --monitor H/H for trend. Monitor for signs of bleeding. 5/2- Hb 10.7- will monitor for bleeding- incision shows scant drainage, but not enough to explain drop- likely due to not being checked for a few days  5/5- Hb up to 11k 5/8- Hb 11.3k 10. Leucocytosis: WBC up to 13.3--likely reactive. Monitor for fevers and other signs of infection  5/2- WBC down to 10.4k- monitor clinically  5/5- WBC up to 12.2 k- this AM- no Sx's of illness per pt- will monitor clinically. 5/7- will recheck Thursday- incision not really draining            5/8- WBC down to 10.9 from 12.2- will con't to monitor clinically 11. Hx of  DOE:  Echo 02/27/24 showed Moderate to severe concentric hypertrophy with EF 45-50% and mild LV hypokinesis. --hx of mild asymptomatic tachycardia per PCP notes. Cont metoprolol  XL 25mg  daily.  12. 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 Rozelle Corning 15. Morbid obesity-BMI 48.46: RD consult for dietary education.  --Encourage/educate on importance of diet and wt loss to help promote health and mobility.  16. Urinary retention- came to use with Foley catheter- will remove in AM and do bladder scans to make sure is emptying-cath if volumes >350cc with coude' 5/2- removed foley this AM- hasn't been 6 hours yet, but in 1-2 hours, will need to be cathed if hasn't voided -03/09/24 low PVRs overnight, monitor. Continue flomax  0.4mg  nightly 5/5- peeing well- con't regimen 5/6- no issues- will stop bladder scans 17. Constipation- LBM 5 days ago- pt refuses Sorbitol  because scared of diarrhea/accidents. Continue miralax  BID and senokot 2 tabs qAM 5/7- LBM last night  5/8-  LBM yesterday 18. GI PPx: Protonix  20mg  daily   I spent a total of  36  minutes on total care today- >50% coordination of care- due to  D/w OT about pt's case as well as pt and nursing- we discussed robaxin  for stiffness and reviewed labs including CBGs- pt to mediate his lunch meal more  LOS: 7 days A FACE TO FACE EVALUATION WAS PERFORMED  Christian [name] 03/14/2024, 8:42 AM

## 2024-03-14 NOTE — Progress Notes (Signed)
 Physical Therapy Session Note  Patient Details  Name: Christian [name] MRN: [id number] Date of Birth: [birth year]  Today's Date: 03/14/2024 PT Individual Time: 0830-0915 PT Individual Time Calculation (min): 45 min   Short Term Goals: Week 1:  PT Short Term Goal 1 (Week 1): pt will ambulate 100 ft with LRAD PT Short Term Goal 2 (Week 1): Pt will perform STS with CGA PT Short Term Goal 3 (Week 1): Pt will maintain back precautions without cue throughout sessions.   Skilled Therapeutic Interventions/Progress Updates:    Pt presents in bed, nursing administering medication. Supervision to come to EOB with extra time and use of momentum and bedrail for support. Donned shoes with supervision from EOB. Short distance gait with RW into and out of the bathroom with min assist for sit > stand and cues for hand placement, CGA for gait. Pt doffed clothing but requires min for pulling up pants. CGA for balance in standing. Gait training for functional strengthening, endurance and moblity x 135 x 2 with seated rest break in between trials. Suggested to work on stairs after first gait bout, pt declined and agreed he would do it in the PM session.   Therapy Documentation Precautions:  Precautions Precautions: Fall, Back Precaution/Restrictions Comments: LSO, able to recall precautions Required Braces or Orthoses: Spinal Brace Spinal Brace: Lumbar corset, Applied in sitting position Restrictions Weight Bearing Restrictions Per Provider Order: No  Pain: Pain Assessment Pain Scale: 0-10 Pain Score: 4  Pain Location: Back Pain Intervention(s): Medication (See eMAR)     Therapy/Group: Individual Therapy  Gita Lamb Amadeo June, PT, DPT, CBIS  03/14/2024, 9:41 AM

## 2024-03-15 DIAGNOSIS — M48062 Spinal stenosis, lumbar region with neurogenic claudication: Secondary | ICD-10-CM | POA: Diagnosis not present

## 2024-03-15 LAB — GLUCOSE, CAPILLARY
Glucose-Capillary: 155 mg/dL — ABNORMAL HIGH (ref 70–99)
Glucose-Capillary: 192 mg/dL — ABNORMAL HIGH (ref 70–99)
Glucose-Capillary: 157 mg/dL — ABNORMAL HIGH (ref 70–99)
Glucose-Capillary: 204 mg/dL — ABNORMAL HIGH (ref 70–99)

## 2024-03-15 NOTE — Progress Notes (Signed)
 Physical Therapy Weekly Progress Note  Patient Details  Name: Christian [name] MRN: [id number] Date of Birth: 1950/11/22  Beginning of progress report period: Mar 08, 2024 End of progress report period: Mar 15, 2024  Today's Date: 03/15/2024   Patient has met 3 of 3 short term goals.  Pt is making steady progress towards goals. He currently performs transfers and ambulation at consistent CGA nearing close supervision. He intermittent requires cues for spinal precautions however maintains them mostly consistently. At times pt is limited by chronic LLE pain but has not demonstrated any instability with standing and ambulating tasks. Pt has initiation stair training and is able to ascend/descend x 8 3in steps with B rail and progressed to [facility] with rail x 4 steps with 6" height and CGA. Pt has also been educated on continued practice on bed mobility without use of bed features until d/c - pt uses a wedge at home and has been able to complete at supervision level.  Patient continues to demonstrate the following deficits muscle weakness and muscle joint tightness and decreased coordination and therefore will continue to benefit from skilled PT intervention to increase functional independence with mobility.  Patient progressing toward long term goals..  Continue plan of care.  PT Short Term Goals Week 1:  PT Short Term Goal 1 (Week 1): pt will ambulate 100 ft with LRAD PT Short Term Goal 1 - Progress (Week 1): Met PT Short Term Goal 2 (Week 1): Pt will perform STS with CGA PT Short Term Goal 2 - Progress (Week 1): Met PT Short Term Goal 3 (Week 1): Pt will maintain back precautions without cue throughout sessions. PT Short Term Goal 3 - Progress (Week 1): Met Week 2:  PT Short Term Goal 1 (Week 2): = LTGs due to ELOS  Skilled Therapeutic Interventions/Progress Updates:  Ambulation/gait training;Cognitive remediation/compensation;Discharge planning;DME/adaptive equipment instruction;Functional  mobility training;Pain management;Psychosocial support;Splinting/orthotics;Therapeutic Activities;UE/LE Strength taining/ROM;Visual/perceptual remediation/compensation;Wheelchair propulsion/positioning;UE/LE Coordination activities;Therapeutic Exercise;Stair training;Skin care/wound management;Patient/family education;Neuromuscular re-education;Functional electrical stimulation;Disease management/prevention;Community reintegration;Balance/vestibular training   Therapy Documentation Precautions:  Precautions Precautions: Fall, Back Precaution/Restrictions Comments: LSO, able to recall precautions Required Braces or Orthoses: Spinal Brace Spinal Brace: Lumbar corset, Applied in sitting position Restrictions Weight Bearing Restrictions Per Provider Order: No   Rosita DeChalus 03/15/2024, 12:39 PM   Ouida Bloom, PT, DPT, CBIS 03/15/24 1:59 PM

## 2024-03-15 NOTE — Progress Notes (Signed)
 Occupational Therapy Weekly Progress Note  Patient Details  Name: Christian [name] MRN: [id number] Date of Birth: September 08, 1951  Beginning of progress report period: Mar 11, 2024 End of progress report period: Mar 15, 2024  Today's Date: 03/15/2024 OT Individual Time: 1308-6578 OT Individual Time Calculation (min): 28 min    Patient with no STGs established due to estimated length of stay. Pt currently requires Min A for UB/LB ADLs, impaired by limited BUE ROM and surgical pain. Discharge planning ongoing.   Patient continues to demonstrate the following deficits: muscle weakness and muscle joint tightness, decreased cardiorespiratoy endurance, and decreased standing balance, decreased balance strategies, and difficulty maintaining precautions and therefore will continue to benefit from skilled OT intervention to enhance overall performance with BADL and Reduce care partner burden.  Patient progressing toward long term goals..  Continue plan of care.  OT Short Term Goals Week 1:  OT Short Term Goal 1 (Week 1): LTG=STG OT Short Term Goal 1 - Progress (Week 1): Progressing toward goal Week 2:  OT Short Term Goal 1 (Week 2): STGs=LTGs due to patient's estimated length of stay.  Skilled Therapeutic Interventions/Progress Updates:   Pt received from direct PTA handoff, un-rated surgical pain with rest provided as needed. Pt performs sit>stand from recliner with Min A + RW, using momentum to power-up. Stand-step transfer from recliner>WC>EOB with CGA + RW. Dependent WC transport to main therapy gym for time management. At table-top, pt instructed in shoulder flexion/extension and abduction/adduction for strengthening/ROM within pain-free zone. Pt returns to supine with supervision, remained resting in bed with all immediate needs met.   Therapy Documentation Precautions:  Precautions Precautions: Fall, Back Precaution/Restrictions Comments: LSO, able to recall precautions Required Braces or  Orthoses: Spinal Brace Spinal Brace: Lumbar corset, Applied in sitting position Restrictions Weight Bearing Restrictions Per Provider Order: No   Therapy/Group: Individual Therapy  Artemus Biles, OTR/L, MSOT  03/15/2024, 6:28 AM

## 2024-03-15 NOTE — Progress Notes (Signed)
 PROGRESS NOTE   Subjective/Complaints:  Pt reports was up all night- every 1 hour woke up- couldn't get comfortable- and was cold.   Bandage loose again and they reinforced it.  R shoulder also hurting really bad as well  LBM 2 days ago but normal for him.    ROS:   Pt denies SOB, abd pain, CP, N/V/C/D, and vision changes     Objective:   No results found. Recent Labs    03/14/24 0617  WBC 10.9*  HGB 11.3*  HCT 34.3*  PLT 287    Recent Labs    03/14/24 0617  NA 138  K 4.2  CL 104  CO2 25  GLUCOSE 199*  BUN 21  CREATININE 0.87  CALCIUM 8.8*     Intake/Output Summary (Last 24 hours) at 03/15/2024 0831 Last data filed at 03/15/2024 0820 Gross per 24 hour  Intake 476 ml  Output --  Net 476 ml        Physical Exam: Vital Signs Blood pressure 122/81, pulse 86, temperature 98.5 F (36.9 C), temperature source Oral, resp. rate 17, height 5\' 6"  (1.676 m), weight (!) 136.2 kg, SpO2 96%.       General: awake, alert, appropriate, BMI 48; struggles to move in bed, but does it, slowly;  NAD HENT: conjugate gaze; oropharynx moist CV: regular rate and rhythm; no JVD Pulmonary: CTA B/L; no W/R/R- good air movement GI: soft, NT, ND, (+)BS- protuberant-  Psychiatric: appropriate Neurological: Ox3  Skin- Has new reinforced tegaderm dressing on back- C/D/I- but ocvering old dressing Neurological: Ox3 Extremities 0- trace LE edema B/L   PRIOR EXAMS: Musculoskeletal:     Cervical back: Neck supple. No tenderness.     Right lower leg: No edema.     Comments: Deltoids 2-/5 due to probable RTC tears; biceps 4+/5 on R 5-/5 on L; Triceps 4+/5 on R and 5-/5 on L; Grip and FA 4+/5 on R and 5-/5 on L- but very limited by having to do hand walking up body for shoulders LE's- RLE- HF 4-/5; KE 4+/5; DF/PF 5-/5 LLE- HF 2-/5; cannot extend or flex L knee due to "bad L knee"; 4+/5 DF and PF Literally cannot extend L  knee past 45 degrees, but says can when standing with assistive device  Skin:    General: Skin is warm and dry.     Comments: Incision with some sanguinous drainage- tegaderm placed with dressing underneath it  Neurological:     Mental Status: He is alert and oriented to person, place, and time.     Comments: Decreased to light touch B/L at midcalf- worse on RLE to light touch and pinprick- absent to pinprick on bottom of feet   Assessment/Plan: 1. Functional deficits which require 3+ hours per day of interdisciplinary therapy in a comprehensive inpatient rehab setting. Physiatrist is providing close team supervision and 24 hour management of active medical problems listed below. Physiatrist and rehab team continue to assess barriers to discharge/monitor patient progress toward functional and medical goals  Care Tool:  Bathing        Body parts bathed by helper: Right arm, Left upper leg, Left arm, Right lower  leg, Chest, Abdomen, Left lower leg, Front perineal area, Buttocks, Right upper leg     Bathing assist Assist Level: Total Assistance - Patient < 25%     Upper Body Dressing/Undressing Upper body dressing   What is the patient wearing?: Pull over shirt    Upper body assist Assist Level: Total Assistance - Patient < 25%    Lower Body Dressing/Undressing Lower body dressing      What is the patient wearing?: Underwear/pull up, Pants     Lower body assist Assist for lower body dressing: Total Assistance - Patient < 25%     Toileting Toileting Toileting Activity did not occur Press photographer and hygiene only): N/A (no void or bm)  Toileting assist Assist for toileting: Maximal Assistance - Patient 25 - 49%     Transfers Chair/bed transfer  Transfers assist     Chair/bed transfer assist level: Minimal Assistance - Patient > 75%     Locomotion Ambulation   Ambulation assist   Ambulation activity did not occur: Safety/medical concerns  Assist level:  Contact Guard/Touching assist Assistive device: Walker-rolling Max distance: 135   Walk 10 feet activity   Assist  Walk 10 feet activity did not occur: Safety/medical concerns  Assist level: Contact Guard/Touching assist Assistive device: Walker-rolling, Orthosis   Walk 50 feet activity   Assist Walk 50 feet with 2 turns activity did not occur: Safety/medical concerns  Assist level: Contact Guard/Touching assist Assistive device: Walker-rolling, Orthosis    Walk 150 feet activity   Assist Walk 150 feet activity did not occur: Safety/medical concerns         Walk 10 feet on uneven surface  activity   Assist Walk 10 feet on uneven surfaces activity did not occur: Safety/medical concerns         Wheelchair     Assist Is the patient using a wheelchair?: Yes Type of Wheelchair: Manual Wheelchair activity did not occur: Safety/medical concerns         Wheelchair 50 feet with 2 turns activity    Assist    Wheelchair 50 feet with 2 turns activity did not occur: Safety/medical concerns       Wheelchair 150 feet activity     Assist  Wheelchair 150 feet activity did not occur: Safety/medical concerns       Blood pressure 122/81, pulse 86, temperature 98.5 F (36.9 C), temperature source Oral, resp. rate 17, height 5\' 6"  (1.676 m), weight (!) 136.2 kg, SpO2 96%.  Medical Problem List and Plan: 1. Functional deficits secondary to severe lumbar stenosis with B/L LE radiculopathy s/p PSF at L4-S1             -patient may  shower -ELOS/Goals: 10-12 days min A- was using scooter outside home prior- had been bedbound for 6 weeks prior to admission            D/c 5/13  - wife worried that  wasn't doing great at home and anxious about d/c- per SW, have spoken with her and explained will need RW at home.   Con't CIR PT and OT- will change back dressing- placed order to change not just reinforce current dressing 2.   Antithrombotics: -DVT/anticoagulation:  Mechanical: Sequential compression devices, entire leg Bilateral lower extremities -will need to check with surgeon at 7 days s/p surgery when can start DVT prophylaxis 5/2- will call Surgeon Monday to get ok to put on Lovenox  5/5- asked PA to get ahold of surgeon to start lovenox  5/6- will  d/w PA about getting ahold of surgeon- if we cannot, it's been enough time, will start Lovenox - will let pharmacy dose- for prophyalxis- has been 9 days since surgery             -antiplatelet therapy: ASA 81mg  daily 3. Pain Management:  tylenol  650mg  QID. Voltaren  gel QID. Cymbalta  60mg  daily. Hydrocodone  ineffective--will change to oxycodone  which he has tried in the past.  5/8- pt reports no real pain this AM- just stiffness- con't regimen, but advised pt to use Robaxin  as needed 4. Mood/Behavior/Sleep: LCSW to follow for evaluation and support.             -antipsychotic agents: N/A 5. Neuropsych/cognition: This patient is capable of making decisions on his own behalf. 6. Skin/Wound Care: Routine pressure relief measures.              --monitor drainage from wound. Dry dressing daily.   5/8- No drainage on dressing  5/9- dressing reinforced- asked them ot change it completely 7. Fluids/Electrolytes/Nutrition: Monitor I/O and routine labs  8. T2DM w/neuropathy: Hgb A1c-5.9 and better controlled. On Novolin 100 units am/30 units pm prn BS>190 and Metformin  2000 mg daily am.  --Monitor BS ac/hs. Followed by Dr. Marianna Shirk --Currently on NPH 10 units bid w/novolog  6 units ac TID/HS?             --will d/c hs coverage and use SSI for now             --Resume metformin  in am and d/c NPH to help transition to home regimen. -Can use Libre to monitor CBGs  5/2- will get pt back on some of home dose and titrate over-  -5/3-4/25 CBGs pretty good, cont regimen as is for now.  5/5- CBG's running 180's to 220's- doing better- con't to monitor  5/6- Bgs doing better-  159-204- will con't regimen and monitor trend 5/8- Cbgs running a little higher 190s- 253- on home dose- pt doesn't want to change meds 5/9- Cbgs look much better- 140-178- cont' regimen CBG (last 3)  Recent Labs    03/14/24 1647 03/14/24 2055 03/15/24 0600  GLUCAP 140* 178* 155*    9.  ABLA: Has had drop in Hgb from 14.0 to 11.2 due to post op anemia             --monitor H/H for trend. Monitor for signs of bleeding. 5/2- Hb 10.7- will monitor for bleeding- incision shows scant drainage, but not enough to explain drop- likely due to not being checked for a few days  5/5- Hb up to 11k 5/8- Hb 11.3k 10. Leucocytosis: WBC up to 13.3--likely reactive. Monitor for fevers and other signs of infection  5/2- WBC down to 10.4k- monitor clinically  5/5- WBC up to 12.2 k- this AM- no Sx's of illness per pt- will monitor clinically. 5/7- will recheck Thursday- incision not really draining            5/8- WBC down to 10.9 from 12.2- will con't to monitor clinically 11. Hx of  DOE:  Echo 02/27/24 showed Moderate to severe concentric hypertrophy with EF 45-50% and mild LV hypokinesis. --hx of mild asymptomatic tachycardia per PCP notes. Cont metoprolol  XL 25mg  daily.  12. 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 Rozelle Corning 15. Morbid  obesity-BMI 48.46: RD consult for dietary education.  --Encourage/educate on importance of diet and wt loss to help promote health and mobility.  16. Urinary retention- came to use with Foley catheter- will remove in AM and do bladder scans to make sure is emptying-cath if volumes >350cc with coude' 5/2- removed foley this AM- hasn't been 6 hours yet, but in 1-2 hours, will need to be cathed if hasn't voided -03/09/24 low PVRs overnight, monitor. Continue flomax  0.4mg  nightly 5/5- peeing well-  con't regimen 5/6- no issues- will stop bladder scans 17. Constipation- LBM 5 days ago- pt refuses Sorbitol  because scared of diarrhea/accidents. Continue miralax  BID and senokot 2 tabs qAM 5/7- LBM last night  5/8- LBM yesterday 5/9- LBM 2 days ago- normal for him 18. GI PPx: Protonix  20mg  daily  I spent a total of 35   minutes on total care today- >50% coordination of care- due to  D/w nursing about bandage, pain and sleep- wait to add sleeping meds. Also spoke with pt about plans- reviewed labs, CBGs, vitals and bowels  LOS: 8 days A FACE TO FACE EVALUATION WAS PERFORMED  Otha Monical 03/15/2024, 8:31 AM

## 2024-03-15 NOTE — Progress Notes (Signed)
 Occupational Therapy Session Note  Patient Details  Name: Christian [name] MRN: [id number] Date of Birth: 07-Mar-1951  Today's Date: 03/15/2024 OT Individual Time: 1345-1430 OT Individual Time Calculation (min): 45 min    Short Term Goals: Week 1:  OT Short Term Goal 1 (Week 1): LTG=STG OT Short Term Goal 1 - Progress (Week 1): Progressing toward goal  Skilled Therapeutic Interventions/Progress Updates:    Pt seated EOB upon arrival. Initial OT intervention focused on discharge planning. Confirmed pt will need BSC for home. KT tape reapplied to BIL shoulders with pt report that KT tape "makes a difference." Pt requrested KT tape for LLE/knee. KT tape applied. Focus on sit<>stand, standing balance, and bed mobility. All tasks with CGA/supervisoin. Pt reports he has had busy day. Schedule reflects he had 5 earlier therapies. Pt reamined in bed with all needs within reach.   Therapy Documentation Precautions:  Precautions Precautions: Fall, Back Precaution/Restrictions Comments: LSO, able to recall precautions Required Braces or Orthoses: Spinal Brace Spinal Brace: Lumbar corset, Applied in sitting position Restrictions Weight Bearing Restrictions Per Provider Order: No Pain: Pt reports 7/10 back pain; repositioned   Therapy/Group: Individual Therapy  [name] 03/15/2024, 3:02 PM

## 2024-03-15 NOTE — Progress Notes (Signed)
 Physical Therapy Session Note  Patient Details  Name: Christian [name] MRN: [id number] Date of Birth: 11-05-1951  Today's Date: 03/15/2024 PT Individual Time: 0800-0900 PT Individual Time Calculation (min): 60 min   Short Term Goals: Week 1:  PT Short Term Goal 1 (Week 1): pt will ambulate 100 ft with LRAD PT Short Term Goal 2 (Week 1): Pt will perform STS with CGA PT Short Term Goal 3 (Week 1): Pt will maintain back precautions without cue throughout sessions.  Skilled Therapeutic Interventions/Progress Updates: Pt presents sidelying in bed and agreeable to therapy, although states was up every hour last night.  Pt transfers sup to sit w/ supervision and use of side rails.  Pt donned B slipper socks in modified Figure 4 positioning 2/2 body habitus.  LSO donned w/ total A.  Pt transferred sit to stand w/ min A.  Pt amb to w/c w/ CGA.  Pt wheeled to small gym for time conservation.  Pt performed LE there ex for calf raises, LAQ RLE, hip flexion B 3 x 10-15.  Pt performed 1# concentration curls and then manually resisted triceps pushdowns maintaining elbows at sides.  Pt amb x 90' w/ RW CGA, and 120' w/ increasing reliance on RW 2/2 L knee pain.  Pt amb to recliner and remained sitting w/ seat alarm on and all needs in reach.     Therapy Documentation Precautions:  Precautions Precautions: Fall, Back Precaution/Restrictions Comments: LSO, able to recall precautions Required Braces or Orthoses: Spinal Brace Spinal Brace: Lumbar corset, Applied in sitting position Restrictions Weight Bearing Restrictions Per Provider Order: No General:   Vital Signs: Therapy Vitals Temp: 98.5 F (36.9 C) Temp Source: Oral Pulse Rate: 86 BP: 122/81 Patient Position (if appropriate): Lying Oxygen Therapy SpO2: 96 % O2 Device: Room Air Pain:7/10 R shoulder and L knee w/ meds. Pain Assessment Pain Scale: 0-10 Pain Score: 7  Pain Location: Back Pain Intervention(s): Medication (See  eMAR)   Therapy/Group: Individual Therapy  Pinchus Weckwerth P Louay Myrie 03/15/2024, 9:00 AM

## 2024-03-15 NOTE — Progress Notes (Signed)
 Occupational Therapy Session Note  Patient Details  Name: Christian [name] MRN: [id number] Date of Birth: [birth year]  Today's Date: 03/15/2024 OT Individual Time: 3329-5188 OT Individual Time Calculation (min): 30 min    Short Term Goals: Week 1:  OT Short Term Goal 1 (Week 1): LTG=STG OT Short Term Goal 1 - Progress (Week 1): Progressing toward goal  Skilled Therapeutic Interventions/Progress Updates:    1:1 Pt received sitting in the recliner. Pt reported he didn't sleep well last night. Discussion about toileting. Pt reports he is able to manage his pants up and down. However pt is unable to perform hygiene after a BM due to back and premorbid shoulder difficulties. Discussion and trial (simulated) of two different styles of toilet aids. Pt with success with tongs. Printed out information where he can purchase some.   Pt able to perform sit to stands with CGA. Pt ambulated from room to the RN station with close supervision. Pt propelled back to the room with bilateral LEs for continued activity and strengthening.   Pt left sitting up in recliner at end of session.   Therapy Documentation Precautions:  Precautions Precautions: Fall, Back Precaution/Restrictions Comments: LSO, able to recall precautions Required Braces or Orthoses: Spinal Brace Spinal Brace: Lumbar corset, Applied in sitting position Restrictions Weight Bearing Restrictions Per Provider Order: No  Pain: Pain Assessment Pain Scale: 0-10 Pain Score: 7  Pain Location: Back Pain Intervention(s): Medication (See eMAR) asked for meds and allowed for rest breaks prn   Therapy/Group: Individual Therapy  [name] [facility] 03/15/2024, 1:44 PM

## 2024-03-15 NOTE — Progress Notes (Signed)
 Physical Therapy Session Note  Patient Details  Name: Christian [name] MRN: [id number] Date of Birth: 17-Nov-1950  Today's Date: 03/15/2024 PT Individual Time: 1035-1100 and 4098-1191 PT Individual Time Calculation (min): 25 min and 37 min  Short Term Goals: Week 1:  PT Short Term Goal 1 (Week 1): pt will ambulate 100 ft with LRAD PT Short Term Goal 2 (Week 1): Pt will perform STS with CGA PT Short Term Goal 3 (Week 1): Pt will maintain back precautions without cue throughout sessions.  Skilled Therapeutic Interventions/Progress Updates: Pt presented in recliner agreeable to therapy. Pt c/o some increased pain in low back and LLE, no intervention requested during session. Discussed home set up and any potential barriers on mobility upon d/c. Provided education will work on bed mobility as pt will have new bed upon d/c but HOB will be minimally elevated. Pt stating need for toiilet. Completed Sit to stand with CGA however pt required x 3 attempts to stand. Pt ambulated to bathroom with CGA nearing close supervision. Pt was able to doff LB clothing and complete toilet transfer with supervision (+ urinary void/charted). Pt stood from elevated toilet with supervision but required minA to pull pants within reach due to falling to ground. Pt returned to recliner and discussed use of alternatives for peri-hygiene including use of bidet at home. Pt agreeable to work on Dentist in next session. Pt left in recliner at end of session with call bell within reach and needs met.   Tx2: Pt presented in bed agreeable to therapy. Pt c/o unrated pain, no intervention requested with rest breaks and repositioning provided during session. Pt completed bed mobility with supervision and increased time with use of bed features. Pt donned shoes supervision. Stood with bed slightly elevated due to multiple attempts at lowered height. Pt was able to complete stand with CGA and RW. Pt then ambulated ~117ft with RW and  CGA to main gym. Pt required seated rest break then was able to complete stair training. Pt provided with own SPC and ascended/descended x 4 steps with L rail and SPC. Per home set up has B rails but unable to reach both. Pt ascended/descended with step to pattern but did not show any instability when completing stairs. Pt returned to w/c and transported back to room due to fatigue. Pt completed ambulatory transfer to bed and left seated EOB at end of session awaiting final session of day with call bell within reach and needs met.      Therapy Documentation Precautions:  Precautions Precautions: Fall, Back Precaution/Restrictions Comments: LSO, able to recall precautions Required Braces or Orthoses: Spinal Brace Spinal Brace: Lumbar corset, Applied in sitting position Restrictions Weight Bearing Restrictions Per Provider Order: No General:   Vital Signs:   Pain: Pain Assessment Pain Scale: 0-10 Pain Score: 7  Pain Location: Back Pain Intervention(s): Medication (See eMAR)    Therapy/Group: Individual Therapy  Irene Collings 03/15/2024, 12:38 PM

## 2024-03-15 NOTE — Plan of Care (Signed)
  Problem: Consults Goal: RH GENERAL PATIENT EDUCATION Description: See Patient Education module for education specifics. Outcome: Progressing   Problem: RH BOWEL ELIMINATION Goal: RH STG MANAGE BOWEL WITH ASSISTANCE Description: STG Manage Bowel with  minimal Assistance. Outcome: Progressing   Problem: RH BLADDER ELIMINATION Goal: RH STG MANAGE BLADDER WITH ASSISTANCE Description: STG Manage Bladder With minimal Assistance Outcome: Progressing   Problem: RH SKIN INTEGRITY Goal: RH STG SKIN FREE OF INFECTION/BREAKDOWN Description: Manage skin free of infection/breakdown with minimal assistance Outcome: Progressing   Problem: RH SAFETY Goal: RH STG ADHERE TO SAFETY PRECAUTIONS W/ASSISTANCE/DEVICE Description: STG Adhere to Safety Precautions With minimal Assistance/Device. Outcome: Progressing   Problem: RH PAIN MANAGEMENT Goal: RH STG PAIN MANAGED AT OR BELOW PT'S PAIN GOAL Description: <4 w/ prns Outcome: Progressing   Problem: RH KNOWLEDGE DEFICIT GENERAL Goal: RH STG INCREASE KNOWLEDGE OF SELF CARE AFTER HOSPITALIZATION Description: Manage increase knowledge of self care after hospitalization with minimal assistance from spouse using educational materials provided Outcome: Progressing   Problem: Education: Goal: Ability to describe self-care measures that may prevent or decrease complications (Diabetes Survival Skills Education) will improve Outcome: Progressing Goal: Individualized Educational Video(s) Outcome: Progressing   Problem: Coping: Goal: Ability to adjust to condition or change in health will improve Outcome: Progressing   Problem: Fluid Volume: Goal: Ability to maintain a balanced intake and output will improve Outcome: Progressing   Problem: Health Behavior/Discharge Planning: Goal: Ability to identify and utilize available resources and services will improve Outcome: Progressing Goal: Ability to manage health-related needs will improve Outcome:  Progressing   Problem: Metabolic: Goal: Ability to maintain appropriate glucose levels will improve Outcome: Progressing   Problem: Nutritional: Goal: Maintenance of adequate nutrition will improve Outcome: Progressing Goal: Progress toward achieving an optimal weight will improve Outcome: Progressing   Problem: Skin Integrity: Goal: Risk for impaired skin integrity will decrease Outcome: Progressing   Problem: Tissue Perfusion: Goal: Adequacy of tissue perfusion will improve Outcome: Progressing

## 2024-03-16 DIAGNOSIS — M48062 Spinal stenosis, lumbar region with neurogenic claudication: Secondary | ICD-10-CM | POA: Diagnosis not present

## 2024-03-16 DIAGNOSIS — R739 Hyperglycemia, unspecified: Secondary | ICD-10-CM | POA: Diagnosis not present

## 2024-03-16 LAB — GLUCOSE, CAPILLARY: Glucose-Capillary: 176 mg/dL — ABNORMAL HIGH (ref 70–99)

## 2024-03-16 NOTE — Progress Notes (Signed)
 PROGRESS NOTE   Subjective/Complaints:  Pt doing well, slept well, pain well managed, LBM just now. Urinating fine. Denies any other complaints or concerns.    ROS:   Pt denies SOB, abd pain, CP, N/V/C/D, and vision changes     Objective:   No results found. Recent Labs    03/14/24 0617  WBC 10.9*  HGB 11.3*  HCT 34.3*  PLT 287    Recent Labs    03/14/24 0617  NA 138  K 4.2  CL 104  CO2 25  GLUCOSE 199*  BUN 21  CREATININE 0.87  CALCIUM 8.8*     Intake/Output Summary (Last 24 hours) at 03/16/2024 1023 Last data filed at 03/16/2024 1610 Gross per 24 hour  Intake 720 ml  Output --  Net 720 ml        Physical Exam: Vital Signs Blood pressure (!) 103/55, pulse 70, temperature 98 F (36.7 C), temperature source Oral, resp. rate 18, height 5\' 6"  (1.676 m), weight (!) 136.2 kg, SpO2 96%.   General: awake, alert, appropriate, obese; struggles to move in bed, but does it, slowly;  NAD HENT: conjugate gaze; oropharynx moist CV: regular rate and rhythm; no JVD Pulmonary: CTA B/L; no W/R/R- good air movement GI: soft, NT, ND, (+)BS- protuberant Psychiatric: appropriate, pleasant Neurological: Ox3 Skin- Has new reinforced tegaderm dressing on back- C/D/I- but ocvering old dressing-- not reassessed 5/10 Extremities- trace LE edema B/L   PRIOR EXAMS: Musculoskeletal:     Cervical back: Neck supple. No tenderness.     Right lower leg: No edema.     Comments: Deltoids 2-/5 due to probable RTC tears; biceps 4+/5 on R 5-/5 on L; Triceps 4+/5 on R and 5-/5 on L; Grip and FA 4+/5 on R and 5-/5 on L- but very limited by having to do hand walking up body for shoulders LE's- RLE- HF 4-/5; KE 4+/5; DF/PF 5-/5 LLE- HF 2-/5; cannot extend or flex L knee due to "bad L knee"; 4+/5 DF and PF Literally cannot extend L knee past 45 degrees, but says can when standing with assistive device  Skin:    General: Skin is  warm and dry.     Comments: Incision with some sanguinous drainage- tegaderm placed with dressing underneath it  Neurological:     Mental Status: He is alert and oriented to person, place, and time.     Comments: Decreased to light touch B/L at midcalf- worse on RLE to light touch and pinprick- absent to pinprick on bottom of feet   Assessment/Plan: 1. Functional deficits which require 3+ hours per day of interdisciplinary therapy in a comprehensive inpatient rehab setting. Physiatrist is providing close team supervision and 24 hour management of active medical problems listed below. Physiatrist and rehab team continue to assess barriers to discharge/monitor patient progress toward functional and medical goals  Care Tool:  Bathing        Body parts bathed by helper: Right arm, Left upper leg, Left arm, Right lower leg, Chest, Abdomen, Left lower leg, Front perineal area, Buttocks, Right upper leg     Bathing assist Assist Level: Total Assistance - Patient < 25%  Upper Body Dressing/Undressing Upper body dressing   What is the patient wearing?: Pull over shirt    Upper body assist Assist Level: Total Assistance - Patient < 25%    Lower Body Dressing/Undressing Lower body dressing      What is the patient wearing?: Underwear/pull up, Pants     Lower body assist Assist for lower body dressing: Total Assistance - Patient < 25%     Toileting Toileting Toileting Activity did not occur Press photographer and hygiene only): N/A (no void or bm)  Toileting assist Assist for toileting: Maximal Assistance - Patient 25 - 49%     Transfers Chair/bed transfer  Transfers assist     Chair/bed transfer assist level: Contact Guard/Touching assist     Locomotion Ambulation   Ambulation assist   Ambulation activity did not occur: Safety/medical concerns  Assist level: Contact Guard/Touching assist Assistive device: Walker-rolling Max distance: 185   Walk 10 feet  activity   Assist  Walk 10 feet activity did not occur: Safety/medical concerns  Assist level: Contact Guard/Touching assist Assistive device: Walker-rolling   Walk 50 feet activity   Assist Walk 50 feet with 2 turns activity did not occur: Safety/medical concerns  Assist level: Contact Guard/Touching assist Assistive device: Walker-rolling    Walk 150 feet activity   Assist Walk 150 feet activity did not occur: Safety/medical concerns  Assist level: Contact Guard/Touching assist Assistive device: Walker-rolling    Walk 10 feet on uneven surface  activity   Assist Walk 10 feet on uneven surfaces activity did not occur: Safety/medical concerns         Wheelchair     Assist Is the patient using a wheelchair?: Yes Type of Wheelchair: Manual Wheelchair activity did not occur: Safety/medical concerns         Wheelchair 50 feet with 2 turns activity    Assist    Wheelchair 50 feet with 2 turns activity did not occur: Safety/medical concerns       Wheelchair 150 feet activity     Assist  Wheelchair 150 feet activity did not occur: Safety/medical concerns       Blood pressure (!) 103/55, pulse 70, temperature 98 F (36.7 C), temperature source Oral, resp. rate 18, height 5\' 6"  (1.676 m), weight (!) 136.2 kg, SpO2 96%.  Medical Problem List and Plan: 1. Functional deficits secondary to severe lumbar stenosis with B/L LE radiculopathy s/p PSF at L4-S1             -patient may  shower -ELOS/Goals: 10-12 days min A- was using scooter outside home prior- had been bedbound for 6 weeks prior to admission            D/c 5/13 - wife worried that  wasn't doing great at home and anxious about d/c- per SW, have spoken with her and explained will need RW at home.  Con't CIR PT and OT- will change back dressing- placed order to change not just reinforce current dressing 2.  Antithrombotics: -DVT/anticoagulation:  Mechanical: Sequential compression devices,  entire leg Bilateral lower extremities -will need to check with surgeon at 7 days s/p surgery when can start DVT prophylaxis 5/2- will call Surgeon Monday to get ok to put on Lovenox  5/5- asked PA to get ahold of surgeon to start lovenox  5/6- will d/w PA about getting ahold of surgeon- if we cannot, it's been enough time, will start Lovenox - will let pharmacy dose- for prophyalxis- has been 9 days since surgery             -  antiplatelet therapy: ASA 81mg  daily 3. Pain Management:  tylenol  650mg  QID. Voltaren  gel QID. Cymbalta  60mg  daily. Hydrocodone  ineffective--will change to oxycodone  which he has tried in the past. 5/8- pt reports no real pain this AM- just stiffness- con't regimen, but advised pt to use Robaxin  as needed 4. Mood/Behavior/Sleep: LCSW to follow for evaluation and support.             -antipsychotic agents: N/A 5. Neuropsych/cognition: This patient is capable of making decisions on his own behalf. 6. Skin/Wound Care: Routine pressure relief measures.              --monitor drainage from wound. Dry dressing daily.   5/8- No drainage on dressing  5/9- dressing reinforced- asked them ot change it completely 7. Fluids/Electrolytes/Nutrition: Monitor I/O and routine labs  8. T2DM w/neuropathy: Hgb A1c-5.9 and better controlled. On Novolin 100 units am/30 units pm prn BS>190 and Metformin  2000 mg daily am.  --Monitor BS ac/hs. Followed by Dr. Marianna Shirk --Currently on NPH 10 units bid w/novolog  6 units ac TID/HS?             --will d/c hs coverage and use SSI for now             --Resume metformin  in am and d/c NPH to help transition to home regimen. -Can use Libre to monitor CBGs  5/2- will get pt back on some of home dose and titrate over-  -5/3-4/25 CBGs pretty good, cont regimen as is for now.  5/5- CBG's running 180's to 220's- doing better- con't to monitor 5/6- Bgs doing better- 159-204- will con't regimen and monitor trend 5/8- Cbgs running a little higher 190s-  253- on home dose- pt doesn't want to change meds 03/16/24 CBGs mostly <200, cont regimen CBG (last 3)  Recent Labs    03/15/24 1634 03/15/24 2055 03/16/24 0550  GLUCAP 157* 204* 176*    9.  ABLA: Has had drop in Hgb from 14.0 to 11.2 due to post op anemia             --monitor H/H for trend. Monitor for signs of bleeding. 5/2- Hb 10.7- will monitor for bleeding- incision shows scant drainage, but not enough to explain drop- likely due to not being checked for a few days  5/5- Hb up to 11k 5/8- Hb 11.3k 10. Leucocytosis: WBC up to 13.3--likely reactive. Monitor for fevers and other signs of infection  5/2- WBC down to 10.4k- monitor clinically 5/5- WBC up to 12.2 k- this AM- no Sx's of illness per pt- will monitor clinically. 5/7- will recheck Thursday- incision not really draining            5/8- WBC down to 10.9 from 12.2- will con't to monitor clinically 11. Hx of  DOE:  Echo 02/27/24 showed Moderate to severe concentric hypertrophy with EF 45-50% and mild LV hypokinesis. --hx of mild asymptomatic tachycardia per PCP notes. Cont metoprolol  XL 25mg  daily.  12. 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 Rozelle Corning 15. Morbid obesity-BMI 48.46: RD consult for dietary education.  --Encourage/educate on importance of diet and wt loss to help promote health and mobility.  16. Urinary retention- came to use with Foley catheter- will remove in AM and do bladder scans to make sure is emptying-cath if  volumes >350cc with coude' 5/2- removed foley this AM- hasn't been 6 hours yet, but in 1-2 hours, will need to be cathed if hasn't voided -03/09/24 low PVRs overnight, monitor. Continue flomax  0.4mg  nightly 5/5- peeing well- con't regimen 5/6- no issues- will stop bladder scans 17. Constipation- LBM 5 days ago- pt refuses  Sorbitol  because scared of diarrhea/accidents. Continue miralax  BID and senokot 2 tabs qAM -03/16/24 LBM just now, monitor 18. GI PPx: Protonix  20mg  daily   LOS: 9 days A FACE TO FACE EVALUATION WAS PERFORMED  [address] 03/16/2024, 10:23 AM

## 2024-03-16 NOTE — Progress Notes (Signed)
 Occupational Therapy Session Note  Patient Details  Name: Christian [name] MRN: [id number] Date of Birth: [birth year]  Today's Date: 03/16/2024 OT Individual Time: 0912-1015 OT Individual Time Calculation (min): 63 min    Short Term Goals: Week 1:  OT Short Term Goal 1 (Week 1): LTG=STG OT Short Term Goal 1 - Progress (Week 1): Progressing toward goal  Skilled Therapeutic Interventions/Progress Updates:     (1st ) OT Treatment Session:   Patient resting in bed at the time of arrival. Patient met with challenging in resting during the night. Patient reports a pain response 4/5 on 0-10 for low back.. Patient in agreement with completing BADL related  task at sink LOF.  Nursing came in to administer morning meds.  Patient was able to transfer from bed LOF to EOB with close S.The pt was able to transfer from EOB to w/c LOF using the RW for a stand pivot transfer with MinA.  The pt was able to use his feet for going to the sink.  The pt was able to doff his over head shirt with MinA.The pt was able to wash his face, chest,arms,and abdomin with s/uA.  The pt was transferred back to the bed LOF and was able to transfer from the w/c to the bed with MinA using the arm of the w/c using the  RW for additional balance for sitting EOB. The pt was able to transition from EOB to supine with close S.  The pt was able to wash his private with s/u A.  The pt was able to donn his underwear and pants with MinA. The pt was Dep with donning his non-skid sock.The pt was able to transfer to the recliner with CGA  using the RW.  The pt  was able to brush his teeth with s/uA incorporating a foam handle. At the end of the session the call light and bedside table were placed within reach with all additional needs addressed.     (2nd) OT Treatment Session: Patient resting in bed at the time of arrival, Patient indicated that he was having pain 7 on a 0-10 for his leg and backs.The pt was able to come from supine in bed to EOB  with SBA using the bed rail.  The pt was able to complete UB exercises using a 2lb dowel for shld rotation and shld flexion ,below the point of pain,  2 sets of 10 with rest breaks as needed. The pt required 2 rest breaks.  The pt went on to complete the UB cycle for 17 minutes in duration with rest breaks as needed.  The pt required 2 rest breaks. The pt was instructed in AE for LB task performs, a sock aid, long hand shoe Bary Boss and a Sports administrator.  The pt was very receptive.  The pt was able to simulate UB dressing of over head garment  using theraband 3x's .  At the end of the session, the pt was able to transfer form EOB to supine in bed with SBA.  The call light and bedside table were place within reach with all additional needs addressed.   Therapy Documentation Precautions:  Precautions Precautions: Fall, Back Precaution/Restrictions Comments: LSO, able to recall precautions Required Braces or Orthoses: Spinal Brace Spinal Brace: Lumbar corset, Applied in sitting position Restrictions Weight Bearing Restrictions Per Provider Order: No  Moises Ang 03/16/2024, 1:02 PM

## 2024-03-16 NOTE — Progress Notes (Signed)
 Physical Therapy Session Note  Patient Details  Name: Christian [name] MRN: [id number] Date of Birth: 18-Nov-1950  Today's Date: 03/16/2024 PT Individual Time: 4098-1191 PT Individual Time Calculation (min): 29 min  Today's Date: 03/16/2024 PT Missed Time: 31 Minutes Missed Time Reason: Pain  Short Term Goals: Week 2:  PT Short Term Goal 1 (Week 2): = LTGs due to ELOS  Skilled Therapeutic Interventions/Progress Updates: Patient sitting in recliner on entrance to room. Patient alert and agreeable to PT session.   Patient reported 5/10 pain in L low back on incision site (rest breaks and active movement provided to assist).   Therapeutic Activity: Bed Mobility: Pt performed sit<supine from EOB with CGA and use of railing on HOB Transfers: Pt performed sit<>stand pivot transfers throughout session with RW and with CGA (some instances required more than one attempt to stand due to pain - pt also reported unrated pain R LE. Pt stated having varicose veins with medical staff already aware - pt participated as much as able during session, but was limited due to pain). Pt transported to ortho gym in [facility] for energy conservation.   - Pt ambulated (roughly 120') from ortho gym<room (attempted to use NuStep, but pt reported increase in pain in R knee due to "bone on bone" with pt wanting to end session, but was able to be encouraged to ambulate back to room at least. Pt CGA throughout for safety with brief standing rest breaks. Pt with decreased cadence and step length due to pain.   Patient L sidelying in bed at end of session with brakes locked, nsg present, bed alarm set, and all needs within reach.      Therapy Documentation Precautions:  Precautions Precautions: Fall, Back Precaution/Restrictions Comments: LSO, able to recall precautions Required Braces or Orthoses: Spinal Brace Spinal Brace: Lumbar corset, Applied in sitting position Restrictions Weight Bearing Restrictions Per Provider  Order: No  Therapy/Group: Individual Therapy  Damauri Minion PTA 03/16/2024, 11:50 AM

## 2024-03-16 NOTE — Plan of Care (Signed)
  Problem: Consults Goal: RH GENERAL PATIENT EDUCATION Description: See Patient Education module for education specifics. Outcome: Progressing   Problem: RH BOWEL ELIMINATION Goal: RH STG MANAGE BOWEL WITH ASSISTANCE Description: STG Manage Bowel with  minimal Assistance. Outcome: Progressing   Problem: RH BLADDER ELIMINATION Goal: RH STG MANAGE BLADDER WITH ASSISTANCE Description: STG Manage Bladder With minimal Assistance Outcome: Progressing   Problem: RH SKIN INTEGRITY Goal: RH STG SKIN FREE OF INFECTION/BREAKDOWN Description: Manage skin free of infection/breakdown with minimal assistance Outcome: Progressing   Problem: RH SAFETY Goal: RH STG ADHERE TO SAFETY PRECAUTIONS W/ASSISTANCE/DEVICE Description: STG Adhere to Safety Precautions With minimal Assistance/Device. Outcome: Progressing   Problem: RH PAIN MANAGEMENT Goal: RH STG PAIN MANAGED AT OR BELOW PT'S PAIN GOAL Description: <4 w/ prns Outcome: Progressing   Problem: RH KNOWLEDGE DEFICIT GENERAL Goal: RH STG INCREASE KNOWLEDGE OF SELF CARE AFTER HOSPITALIZATION Description: Manage increase knowledge of self care after hospitalization with minimal assistance from spouse using educational materials provided Outcome: Progressing   Problem: Education: Goal: Ability to describe self-care measures that may prevent or decrease complications (Diabetes Survival Skills Education) will improve Outcome: Progressing Goal: Individualized Educational Video(s) Outcome: Progressing   Problem: Coping: Goal: Ability to adjust to condition or change in health will improve Outcome: Progressing   Problem: Fluid Volume: Goal: Ability to maintain a balanced intake and output will improve Outcome: Progressing   Problem: Health Behavior/Discharge Planning: Goal: Ability to identify and utilize available resources and services will improve Outcome: Progressing Goal: Ability to manage health-related needs will improve Outcome:  Progressing   Problem: Metabolic: Goal: Ability to maintain appropriate glucose levels will improve Outcome: Progressing   Problem: Nutritional: Goal: Maintenance of adequate nutrition will improve Outcome: Progressing Goal: Progress toward achieving an optimal weight will improve Outcome: Progressing   Problem: Skin Integrity: Goal: Risk for impaired skin integrity will decrease Outcome: Progressing   Problem: Tissue Perfusion: Goal: Adequacy of tissue perfusion will improve Outcome: Progressing

## 2024-03-17 DIAGNOSIS — M48062 Spinal stenosis, lumbar region with neurogenic claudication: Secondary | ICD-10-CM | POA: Diagnosis not present

## 2024-03-17 DIAGNOSIS — R739 Hyperglycemia, unspecified: Secondary | ICD-10-CM | POA: Diagnosis not present

## 2024-03-17 LAB — GLUCOSE, CAPILLARY
Glucose-Capillary: 198 mg/dL — ABNORMAL HIGH (ref 70–99)
Glucose-Capillary: 159 mg/dL — ABNORMAL HIGH (ref 70–99)

## 2024-03-17 NOTE — Plan of Care (Signed)
  Problem: Consults Goal: RH GENERAL PATIENT EDUCATION Description: See Patient Education module for education specifics. Outcome: Progressing   Problem: RH BOWEL ELIMINATION Goal: RH STG MANAGE BOWEL WITH ASSISTANCE Description: STG Manage Bowel with  minimal Assistance. Outcome: Progressing   Problem: RH BLADDER ELIMINATION Goal: RH STG MANAGE BLADDER WITH ASSISTANCE Description: STG Manage Bladder With minimal Assistance Outcome: Progressing   Problem: RH SKIN INTEGRITY Goal: RH STG SKIN FREE OF INFECTION/BREAKDOWN Description: Manage skin free of infection/breakdown with minimal assistance Outcome: Progressing   Problem: RH SAFETY Goal: RH STG ADHERE TO SAFETY PRECAUTIONS W/ASSISTANCE/DEVICE Description: STG Adhere to Safety Precautions With minimal Assistance/Device. Outcome: Progressing   Problem: RH PAIN MANAGEMENT Goal: RH STG PAIN MANAGED AT OR BELOW PT'S PAIN GOAL Description: <4 w/ prns Outcome: Progressing   Problem: RH KNOWLEDGE DEFICIT GENERAL Goal: RH STG INCREASE KNOWLEDGE OF SELF CARE AFTER HOSPITALIZATION Description: Manage increase knowledge of self care after hospitalization with minimal assistance from spouse using educational materials provided Outcome: Progressing   Problem: Education: Goal: Ability to describe self-care measures that may prevent or decrease complications (Diabetes Survival Skills Education) will improve Outcome: Progressing Goal: Individualized Educational Video(s) Outcome: Progressing   Problem: Coping: Goal: Ability to adjust to condition or change in health will improve Outcome: Progressing   Problem: Fluid Volume: Goal: Ability to maintain a balanced intake and output will improve Outcome: Progressing   Problem: Health Behavior/Discharge Planning: Goal: Ability to identify and utilize available resources and services will improve Outcome: Progressing Goal: Ability to manage health-related needs will improve Outcome:  Progressing   Problem: Metabolic: Goal: Ability to maintain appropriate glucose levels will improve Outcome: Progressing   Problem: Nutritional: Goal: Maintenance of adequate nutrition will improve Outcome: Progressing Goal: Progress toward achieving an optimal weight will improve Outcome: Progressing   Problem: Skin Integrity: Goal: Risk for impaired skin integrity will decrease Outcome: Progressing   Problem: Tissue Perfusion: Goal: Adequacy of tissue perfusion will improve Outcome: Progressing

## 2024-03-17 NOTE — Progress Notes (Signed)
 PROGRESS NOTE   Subjective/Complaints:  Pt doing well again, slept well, pain well managed, LBM early this morning per pt. Urinating fine. Denies any other complaints or concerns.    ROS:   Pt denies SOB, abd pain, CP, N/V/C/D, and vision changes     Objective:   No results found. No results for input(s): "WBC", "HGB", "HCT", "PLT" in the last 72 hours.   No results for input(s): "NA", "K", "CL", "CO2", "GLUCOSE", "BUN", "CREATININE", "CALCIUM" in the last 72 hours.    Intake/Output Summary (Last 24 hours) at 03/17/2024 1012 Last data filed at 03/16/2024 1809 Gross per 24 hour  Intake 597 ml  Output --  Net 597 ml        Physical Exam: Vital Signs Blood pressure (!) 154/87, pulse 87, temperature 97.7 F (36.5 C), temperature source Oral, resp. rate 18, height 5\' 6"  (1.676 m), weight (!) 136.2 kg, SpO2 94%.   General: awake, alert, appropriate, obese; struggles to move in bed, but does it, slowly;  NAD HENT: conjugate gaze; oropharynx moist CV: regular rate and rhythm; no JVD Pulmonary: CTA B/L; no W/R/R- good air movement GI: soft, NT, ND, (+)BS- protuberant Psychiatric: appropriate, pleasant Neurological: Ox3 Skin- Has new reinforced tegaderm dressing on back- C/D/I- but ocvering old dressing-- not reassessed 5/11 Extremities- trace LE edema B/L   PRIOR EXAMS: Musculoskeletal:     Cervical back: Neck supple. No tenderness.     Right lower leg: No edema.     Comments: Deltoids 2-/5 due to probable RTC tears; biceps 4+/5 on R 5-/5 on L; Triceps 4+/5 on R and 5-/5 on L; Grip and FA 4+/5 on R and 5-/5 on L- but very limited by having to do hand walking up body for shoulders LE's- RLE- HF 4-/5; KE 4+/5; DF/PF 5-/5 LLE- HF 2-/5; cannot extend or flex L knee due to "bad L knee"; 4+/5 DF and PF Literally cannot extend L knee past 45 degrees, but says can when standing with assistive device  Skin:    General:  Skin is warm and dry.     Comments: Incision with some sanguinous drainage- tegaderm placed with dressing underneath it  Neurological:     Mental Status: He is alert and oriented to person, place, and time.     Comments: Decreased to light touch B/L at midcalf- worse on RLE to light touch and pinprick- absent to pinprick on bottom of feet   Assessment/Plan: 1. Functional deficits which require 3+ hours per day of interdisciplinary therapy in a comprehensive inpatient rehab setting. Physiatrist is providing close team supervision and 24 hour management of active medical problems listed below. Physiatrist and rehab team continue to assess barriers to discharge/monitor patient progress toward functional and medical goals  Care Tool:  Bathing        Body parts bathed by helper: Right arm, Left upper leg, Left arm, Right lower leg, Chest, Abdomen, Left lower leg, Front perineal area, Buttocks, Right upper leg     Bathing assist Assist Level: Total Assistance - Patient < 25%     Upper Body Dressing/Undressing Upper body dressing   What is the patient wearing?: Pull over shirt  Upper body assist Assist Level: Total Assistance - Patient < 25%    Lower Body Dressing/Undressing Lower body dressing      What is the patient wearing?: Underwear/pull up, Pants     Lower body assist Assist for lower body dressing: Total Assistance - Patient < 25%     Toileting Toileting Toileting Activity did not occur Press photographer and hygiene only): N/A (no void or bm)  Toileting assist Assist for toileting: Maximal Assistance - Patient 25 - 49%     Transfers Chair/bed transfer  Transfers assist     Chair/bed transfer assist level: Contact Guard/Touching assist     Locomotion Ambulation   Ambulation assist   Ambulation activity did not occur: Safety/medical concerns  Assist level: Contact Guard/Touching assist Assistive device: Walker-rolling Max distance: 185   Walk 10  feet activity   Assist  Walk 10 feet activity did not occur: Safety/medical concerns  Assist level: Contact Guard/Touching assist Assistive device: Walker-rolling   Walk 50 feet activity   Assist Walk 50 feet with 2 turns activity did not occur: Safety/medical concerns  Assist level: Contact Guard/Touching assist Assistive device: Walker-rolling    Walk 150 feet activity   Assist Walk 150 feet activity did not occur: Safety/medical concerns  Assist level: Contact Guard/Touching assist Assistive device: Walker-rolling    Walk 10 feet on uneven surface  activity   Assist Walk 10 feet on uneven surfaces activity did not occur: Safety/medical concerns         Wheelchair     Assist Is the patient using a wheelchair?: Yes Type of Wheelchair: Manual Wheelchair activity did not occur: Safety/medical concerns         Wheelchair 50 feet with 2 turns activity    Assist    Wheelchair 50 feet with 2 turns activity did not occur: Safety/medical concerns       Wheelchair 150 feet activity     Assist  Wheelchair 150 feet activity did not occur: Safety/medical concerns       Blood pressure (!) 154/87, pulse 87, temperature 97.7 F (36.5 C), temperature source Oral, resp. rate 18, height 5\' 6"  (1.676 m), weight (!) 136.2 kg, SpO2 94%.  Medical Problem List and Plan: 1. Functional deficits secondary to severe lumbar stenosis with B/L LE radiculopathy s/p PSF at L4-S1             -patient may  shower -ELOS/Goals: 10-12 days min A- was using scooter outside home prior- had been bedbound for 6 weeks prior to admission            D/c 5/13 - wife worried that  wasn't doing great at home and anxious about d/c- per SW, have spoken with her and explained will need RW at home.  Con't CIR PT and OT- will change back dressing- placed order to change not just reinforce current dressing 2.  Antithrombotics: -DVT/anticoagulation:  Mechanical: Sequential compression  devices, entire leg Bilateral lower extremities -will need to check with surgeon at 7 days s/p surgery when can start DVT prophylaxis 5/2- will call Surgeon Monday to get ok to put on Lovenox  5/5- asked PA to get ahold of surgeon to start lovenox  5/6- will d/w PA about getting ahold of surgeon- if we cannot, it's been enough time, will start Lovenox - will let pharmacy dose- for prophyalxis- has been 9 days since surgery             -antiplatelet therapy: ASA 81mg  daily 3. Pain Management:  tylenol  650mg   QID. Voltaren  gel QID. Cymbalta  60mg  daily. Hydrocodone  ineffective--will change to oxycodone  which he has tried in the past. 5/8- pt reports no real pain this AM- just stiffness- con't regimen, but advised pt to use Robaxin  as needed 4. Mood/Behavior/Sleep: LCSW to follow for evaluation and support.             -antipsychotic agents: N/A 5. Neuropsych/cognition: This patient is capable of making decisions on his own behalf. 6. Skin/Wound Care: Routine pressure relief measures.              --monitor drainage from wound. Dry dressing daily.   5/8- No drainage on dressing  5/9- dressing reinforced- asked them ot change it completely 7. Fluids/Electrolytes/Nutrition: Monitor I/O and routine labs  8. T2DM w/neuropathy: Hgb A1c-5.9 and better controlled. On Novolin 100 units am/30 units pm prn BS>190 and Metformin  2000 mg daily am.  --Monitor BS ac/hs. Followed by Dr. Marianna Shirk --Currently on NPH 10 units bid w/novolog  6 units ac TID/HS?             --will d/c hs coverage and use SSI for now             --Resume metformin  in am and d/c NPH to help transition to home regimen. -Can use Libre to monitor CBGs  5/2- will get pt back on some of home dose and titrate over-  -5/3-4/25 CBGs pretty good, cont regimen as is for now.  5/5- CBG's running 180's to 220's- doing better- con't to monitor 5/6- Bgs doing better- 159-204- will con't regimen and monitor trend 5/8- Cbgs running a little  higher 190s- 253- on home dose- pt doesn't want to change meds 5/10-11/25 CBGs mostly <200, cont regimen CBG (last 3)  Recent Labs    03/15/24 1634 03/15/24 2055 03/16/24 0550  GLUCAP 157* 204* 176*    9.  ABLA: Has had drop in Hgb from 14.0 to 11.2 due to post op anemia             --monitor H/H for trend. Monitor for signs of bleeding. 5/2- Hb 10.7- will monitor for bleeding- incision shows scant drainage, but not enough to explain drop- likely due to not being checked for a few days  5/5- Hb up to 11k 5/8- Hb 11.3k 10. Leucocytosis: WBC up to 13.3--likely reactive. Monitor for fevers and other signs of infection  5/2- WBC down to 10.4k- monitor clinically 5/5- WBC up to 12.2 k- this AM- no Sx's of illness per pt- will monitor clinically. 5/7- will recheck Thursday- incision not really draining            5/8- WBC down to 10.9 from 12.2- will con't to monitor clinically 11. Hx of  DOE:  Echo 02/27/24 showed Moderate to severe concentric hypertrophy with EF 45-50% and mild LV hypokinesis. --hx of mild asymptomatic tachycardia per PCP notes. Cont metoprolol  XL 25mg  daily.  12. 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 Rozelle Corning 15. Morbid obesity-BMI 48.46: RD consult for dietary education.  --Encourage/educate on importance of diet and wt loss to help promote health and mobility.  16. Urinary retention- came to use with Foley catheter- will remove in AM and do bladder scans to make sure is emptying-cath if volumes >350cc with coude' 5/2- removed foley this AM- hasn't been  6 hours yet, but in 1-2 hours, will need to be cathed if hasn't voided -03/09/24 low PVRs overnight, monitor. Continue flomax  0.4mg  nightly 5/5- peeing well- con't regimen 5/6- no issues- will stop bladder scans 17. Constipation- LBM 5 days  ago- pt refuses Sorbitol  because scared of diarrhea/accidents. Continue miralax  BID and senokot 2 tabs qAM -03/17/24 LBM this morning per pt, monitor 18. GI PPx: Protonix  20mg  daily   LOS: 10 days A FACE TO FACE EVALUATION WAS PERFORMED  [address] 03/17/2024, 10:12 AM

## 2024-03-18 ENCOUNTER — Other Ambulatory Visit (HOSPITAL_COMMUNITY): Payer: Self-pay

## 2024-03-18 DIAGNOSIS — D72829 Elevated white blood cell count, unspecified: Secondary | ICD-10-CM

## 2024-03-18 DIAGNOSIS — D62 Acute posthemorrhagic anemia: Secondary | ICD-10-CM | POA: Diagnosis not present

## 2024-03-18 DIAGNOSIS — R339 Retention of urine, unspecified: Secondary | ICD-10-CM

## 2024-03-18 DIAGNOSIS — Z794 Long term (current) use of insulin: Secondary | ICD-10-CM

## 2024-03-18 DIAGNOSIS — M48062 Spinal stenosis, lumbar region with neurogenic claudication: Secondary | ICD-10-CM | POA: Diagnosis not present

## 2024-03-18 DIAGNOSIS — E1142 Type 2 diabetes mellitus with diabetic polyneuropathy: Secondary | ICD-10-CM

## 2024-03-18 DIAGNOSIS — K5900 Constipation, unspecified: Secondary | ICD-10-CM

## 2024-03-18 LAB — CBC
HCT: 35 % — ABNORMAL LOW (ref 39.0–52.0)
Hemoglobin: 11.4 g/dL — ABNORMAL LOW (ref 13.0–17.0)
MCH: 32.3 pg (ref 26.0–34.0)
MCHC: 32.6 g/dL (ref 30.0–36.0)
MCV: 99.2 fL (ref 80.0–100.0)
Platelets: 320 10*3/uL (ref 150–400)
RBC: 3.53 MIL/uL — ABNORMAL LOW (ref 4.22–5.81)
RDW: 13.7 % (ref 11.5–15.5)
WBC: 13.7 10*3/uL — ABNORMAL HIGH (ref 4.0–10.5)
nRBC: 0 % (ref 0.0–0.2)

## 2024-03-18 LAB — BASIC METABOLIC PANEL WITH GFR
Anion gap: 7 (ref 5–15)
BUN: 13 mg/dL (ref 8–23)
CO2: 26 mmol/L (ref 22–32)
Calcium: 8.7 mg/dL — ABNORMAL LOW (ref 8.9–10.3)
Chloride: 105 mmol/L (ref 98–111)
Creatinine, Ser: 0.79 mg/dL (ref 0.61–1.24)
GFR, Estimated: >60 mL/min (ref >60)
Glucose, Bld: 161 mg/dL — ABNORMAL HIGH (ref 70–99)
Potassium: 3.8 mmol/L (ref 3.5–5.1)
Sodium: 138 mmol/L (ref 135–145)

## 2024-03-18 MED ORDER — DICLOFENAC SODIUM 1 % EX GEL
2.0000 g | Freq: Four times a day (QID) | CUTANEOUS | 0 refills | Status: DC
  Filled 2024-03-18: qty 300, 30d supply, fill #0

## 2024-03-18 MED ORDER — ACETAMINOPHEN 325 MG PO TABS
650.0000 mg | ORAL_TABLET | Freq: Three times a day (TID) | ORAL | 0 refills | Status: DC
  Filled 2024-03-18: qty 120, 15d supply, fill #0

## 2024-03-18 MED ORDER — SENNOSIDES-DOCUSATE SODIUM 8.6-50 MG PO TABS
1.0000 | ORAL_TABLET | Freq: Every day | ORAL | 0 refills | Status: DC
  Filled 2024-03-18: qty 30, 30d supply, fill #0

## 2024-03-18 MED ORDER — ADULT MULTIVITAMIN W/MINERALS CH: 1.0000 | ORAL_TABLET | Freq: Every day | ORAL | Status: AC

## 2024-03-18 MED ORDER — DULOXETINE HCL 60 MG PO CPEP
60.0000 mg | ORAL_CAPSULE | Freq: Every day | ORAL | 0 refills | Status: DC
  Filled 2024-03-18: qty 30, 30d supply, fill #0

## 2024-03-18 MED ORDER — OXYCODONE HCL 5 MG PO TABS
5.0000 mg | ORAL_TABLET | Freq: Four times a day (QID) | ORAL | 0 refills | Status: DC | PRN
Start: 2024-03-18 — End: 2024-07-02
  Filled 2024-03-18: qty 30, 4d supply, fill #0

## 2024-03-18 MED ORDER — METFORMIN HCL 1000 MG PO TABS: 2000.0000 mg | ORAL_TABLET | Freq: Every day | ORAL | Status: DC

## 2024-03-18 MED ORDER — INSULIN REGULAR HUMAN (CONC) 500 UNIT/ML ~~LOC~~ SOPN
10.0000 [IU] | PEN_INJECTOR | Freq: Three times a day (TID) | SUBCUTANEOUS | Status: DC
  Administered 2024-03-18 – 2024-03-19 (×3): 10 [IU] via SUBCUTANEOUS
  Filled 2024-03-18: qty 3

## 2024-03-18 MED ORDER — POLYETHYLENE GLYCOL 3350 17 GM/SCOOP PO POWD
17.0000 g | Freq: Every day | ORAL | 0 refills | Status: DC
  Filled 2024-03-18: qty 238, 14d supply, fill #0

## 2024-03-18 MED ORDER — METOPROLOL SUCCINATE ER 25 MG PO TB24
25.0000 mg | ORAL_TABLET | Freq: Every day | ORAL | 0 refills | Status: DC
  Filled 2024-03-18: qty 30, 30d supply, fill #0

## 2024-03-18 MED ORDER — TAMSULOSIN HCL 0.4 MG PO CAPS
0.4000 mg | ORAL_CAPSULE | Freq: Every day | ORAL | 0 refills | Status: DC
  Filled 2024-03-18: qty 30, 30d supply, fill #0

## 2024-03-18 MED ORDER — TRAMADOL HCL 50 MG PO TABS
50.0000 mg | ORAL_TABLET | Freq: Two times a day (BID) | ORAL | 0 refills | Status: DC | PRN
  Filled 2024-03-18: qty 10, 5d supply, fill #0

## 2024-03-18 MED ORDER — METHOCARBAMOL 500 MG PO TABS
500.0000 mg | ORAL_TABLET | Freq: Four times a day (QID) | ORAL | 0 refills | Status: DC | PRN
  Filled 2024-03-18: qty 45, 12d supply, fill #0

## 2024-03-18 MED ORDER — MELATONIN 5 MG PO TABS: 5.0000 mg | ORAL_TABLET | Freq: Every evening | ORAL | Status: DC | PRN

## 2024-03-18 MED ORDER — TRAMADOL HCL 50 MG PO TABS
50.0000 mg | ORAL_TABLET | Freq: Two times a day (BID) | ORAL | 0 refills | Status: DC | PRN
  Filled 2024-03-18: qty 14, 7d supply, fill #0

## 2024-03-18 NOTE — Discharge Summary (Signed)
 Physician Discharge Summary  Patient ID: Christian [name] MRN: [id number] DOB/AGE: September 25, 1951 73 y.o.  Admit date: 03/07/2024 Discharge date: 03/19/2024  Discharge Diagnoses:  Principal Problem:   Neurogenic claudication due to lumbar spinal stenosis Active Problems:   Hypertension   Primary osteoarthritis of left knee   Morbid obesity (HCC)   Status post total right knee replacement   Chronic left shoulder pain   Polyneuropathy associated with underlying disease (HCC)   Impingement syndrome, shoulder, right   Insulin  dependent type 2 diabetes mellitus (HCC)   Acute blood loss anemia   Leucocytosis   Discharged Condition: stable  Significant Diagnostic Studies: No results found.  Labs:  Basic Metabolic Panel: Recent Labs  Lab 03/14/24 0617 03/18/24 0608  NA 138 138  K 4.2 3.8  CL 104 105  CO2 25 26  GLUCOSE 199* 161*  BUN 21 13  CREATININE 0.87 0.79  CALCIUM 8.8* 8.7*    CBC: Recent Labs  Lab 03/14/24 0617 03/18/24 0608 03/19/24 0534  WBC 10.9* 13.7* 11.4*  HGB 11.3* 11.4* 11.2*  HCT 34.3* 35.0* 34.1*  MCV 99.1 99.2 97.7  PLT 287 320 279    CBG: Recent Labs  Lab 03/15/24 1634 03/15/24 2055 03/16/24 0550 03/17/24 1201 03/17/24 1726  GLUCAP 157* 204* 176* 198* 159*    Brief HPI:   Christian [name] is a 73 y.o. RH-male with history of T2DM with peripheral neuropathy, HTN, morbid obesity-BMI 46, OA bilateral knees left greater than right, left shoulder pain, rotator cuff injuries, lumbar stenosis with radiculopathy RLE neurogenic claudication; who elected to undergo L4-S1 laminectomy transforaminal interbody fusion by Dr. Robbert Childes at [facility] on 0/20/25.  Postop recommendations of wearing LSO when out of bed.  He has had issues with acute blood loss anemia as well as rising WBC felt to be reactive in nature.  Pain control was improving but he was noted to be limited on was requiring mod to max assist with mobility.  Therapy was  working with patient and CIR was recommended due to functional decline.   Hospital Course: Christian [name] was admitted to rehab 03/07/2024 for inpatient therapies to consist of PT and OT at least three hours five days a week. Past admission physiatrist, therapy team and rehab RN have worked together to provide customized collaborative inpatient rehab.   Blood pressures were monitored on TID basis and   Diabetes has been monitored with ac/hs CBG checks and SSI was use prn for tighter BS control.    Rehab course: During patient's stay in rehab weekly team conferences were held to monitor patient's progress, set goals and discuss barriers to discharge. At admission, patient required  He  has had improvement in activity tolerance, balance, postural control as well as ability to compensate for deficits. He requires supervision to min assist with ADL tasks.  He is independent for bed mobility and requires supervision with transfers and to ambulate 150 feet with rolling walker and cues for posture.  He requires contact-guard assist to climb 8 stairs     Discharge disposition: 01-Home or Self Care  Diet: Carb Modified.   Special Instructions:  Discharge Instructions     Amb Referral to Nutrition and Diabetic Education   Complete by: As directed    Diabetes and weight loss education.      Allergies as of 03/19/2024       Reactions   Jardiance  [empagliflozin ]    Yeast infections   Penicillins Rash  Medication List     STOP taking these medications    diclofenac  75 MG EC tablet Commonly known as: VOLTAREN    oxyCODONE -acetaminophen  5-325 MG tablet Commonly known as: PERCOCET/ROXICET       TAKE these medications    acetaminophen  325 MG tablet Commonly known as: TYLENOL  Take 2 tablets (650 mg total) by mouth 4 (four) times daily -  with meals and at bedtime.   aspirin  81 MG tablet Take 81 mg by mouth daily.   BD Pen Needle Nano 2nd Gen 32G X 4 MM Misc Generic  drug: Insulin  Pen Needle USE AS DIRECTED 5 TIMES A DAY WITH INSULIN  INJECTIONS   diclofenac  Sodium 1 % Gel Commonly known as: VOLTAREN  Apply 2 g topically 4 (four) times daily. To your left knee ( and bilateral shoulder if needed)   DULoxetine  60 MG capsule Commonly known as: CYMBALTA  Take 1 capsule (60 mg total) by mouth daily.   FreeStyle Libre 3 Sensor Misc by Does not apply route. Use to check glucose continuously change every 14 days   HumuLIN  R U-500 KwikPen 500 UNIT/ML KwikPen Generic drug: insulin  regular human CONCENTRATED INJECT 200 UNITS UNDER SKIN A DAY AS ADVISED What changed: See the new instructions. Notes to patient: You have been getting 10 units three times a day--slow increase by 5 units to home dose if blood sugars are consistently over 150   melatonin 5 MG Tabs Take 1 tablet (5 mg total) by mouth at bedtime as needed.   metFORMIN  1000 MG tablet Commonly known as: GLUCOPHAGE  Take 2 tablets (2,000 mg total) by mouth daily with breakfast. What changed:  how much to take when to take this   methocarbamol  500 MG tablet Commonly known as: ROBAXIN  Take 1 tablet (500 mg total) by mouth 4 (four) times daily as needed for muscle spasms.   metoprolol  succinate 25 MG 24 hr tablet Commonly known as: TOPROL -XL Take 1 tablet (25 mg total) by mouth daily.   multivitamin with minerals Tabs tablet Take 1 tablet by mouth daily with lunch.   omeprazole  20 MG capsule Commonly known as: PRILOSEC Take 20 mg by mouth daily.   oxyCODONE  5 MG immediate release tablet--Rx # 30 pills Commonly known as: Oxy IR/ROXICODONE  Take 1-2 tablets (5-10 mg total) by mouth every 6 (six) hours as needed for severe pain (pain score 7-10) (5 mg for pain range 7-8 and 10 mg for pain > level 8). Notes to patient: Start cutting down to one pill twice a day AS NEEDED FOR SEVERE PAIN. MAX 3 PILLS PER DAY   polyethylene glycol powder 17 GM/SCOOP powder Commonly known as: GLYCOLAX /MIRALAX  Take  1 capful (17 g) by mouth daily.   protein supplement Powd Take 6 g by mouth 3 (three) times daily with meals.   senna-docusate 8.6-50 MG tablet Commonly known as: Senokot-S Take 1 tablet by mouth daily at 6 (six) AM.   tamsulosin  0.4 MG Caps capsule Commonly known as: FLOMAX  Take 1 capsule (0.4 mg total) by mouth daily after supper.   traMADol  50 MG tablet--Rx # 10 pills Commonly known as: ULTRAM  Take 1 tablet (50 mg total) by mouth every 12 (twelve) hours as needed (moderate pain level 4-6). Notes to patient: Use this in between oxycodone  to wean off narcotics.         Follow-up Information     Cloteal Daniels, MD Follow up.   Specialty: Orthopedic Surgery Why: Call in 1-2 days for post hospital follow up Contact information: [address] [place] [address] [phone]         Gean Keels, MD Follow up on 03/25/2024.   Specialties: Family Medicine, Sports Medicine, Radiology Why: Appointment at 10:45 am., Call in 1-2 days for post hospital follow up Contact information: [address] [place] [address] [phone]         Celia Coles, MD Follow up.   Specialty: Physical Medicine and Rehabilitation Why: office will call you with follow up appointment Contact information: [address] [place] [address] [phone]                 Signed: Zelda Hickman 03/19/2024, 9:48 AM

## 2024-03-18 NOTE — Progress Notes (Signed)
 Inpatient Rehabilitation Care Coordinator Discharge Note   Patient Details  Name: Christian [name] MRN: [id number] Date of Birth: [birth year]   Discharge location: HOME WITH WIFE WHO IS ABLE TO ASSIST AND WAS PRIOR TO ADMISSION  Length of Stay: 12 DAYS  Discharge activity level: MOD/I-SUPERVISION LEVEL  Home/community participation: ACTIVE  Patient response NW:GNFAOZ Literacy - How often do you need to have someone help you when you read instructions, pamphlets, or other written material from your doctor or pharmacy?: Never  Patient response HY:[id number] Isolation - How often do you feel lonely or isolated from those around you?: Rarely  Services provided included: MD, RD, PT, OT, RN, CM, Pharmacy, SW  Financial Services:  Financial Services Utilized: Medicare    Choices offered to/list presented to: PT  Follow-up services arranged:  Outpatient, DME, Patient/Family has no preference for HH/DME agencies    Outpatient Servicies: [facility] WILL CALL TO SET UP FOLLOW UP APPOINTMENTS DME : [facility] AND BEDSIDE [facility]    Patient response to transportation need: Is the patient able to respond to transportation needs?: Yes In the past 12 months, has lack of transportation kept you from medical appointments or from getting medications?: No In the past 12 months, has lack of transportation kept you from meetings, work, or from getting things needed for daily living?: No   Patient/Family verbalized understanding of follow-up arrangements:  Yes  Individual responsible for coordination of the follow-up plan: SELF  MARIE-WIFE [phone]  Confirmed correct DME delivered: Mardell Shade 03/18/2024    Comments (or additional information):PT DID WELL AND RECOVERED FROM HIS BACK SURGERY. NO FAMILY ED NEEDED DUE TO MOD/I AND CAN DIRECT OWN CARE  Summary of Stay    Date/Time Discharge Planning CSW  03/12/24 1013 Home with wife who is able to provide  supervision-light min assist. Pt is motivated to do what he can for himself. RGD       Ertha Nabor G

## 2024-03-18 NOTE — Plan of Care (Signed)
  Problem: Consults Goal: RH GENERAL PATIENT EDUCATION Description: See Patient Education module for education specifics. Outcome: Progressing   Problem: RH BOWEL ELIMINATION Goal: RH STG MANAGE BOWEL WITH ASSISTANCE Description: STG Manage Bowel with  minimal Assistance. Outcome: Progressing   Problem: RH BLADDER ELIMINATION Goal: RH STG MANAGE BLADDER WITH ASSISTANCE Description: STG Manage Bladder With minimal Assistance Outcome: Progressing   Problem: RH SKIN INTEGRITY Goal: RH STG SKIN FREE OF INFECTION/BREAKDOWN Description: Manage skin free of infection/breakdown with minimal assistance Outcome: Progressing   Problem: RH SAFETY Goal: RH STG ADHERE TO SAFETY PRECAUTIONS W/ASSISTANCE/DEVICE Description: STG Adhere to Safety Precautions With minimal Assistance/Device. Outcome: Progressing   Problem: Education: Goal: Ability to describe self-care measures that may prevent or decrease complications (Diabetes Survival Skills Education) will improve Outcome: Progressing Goal: Individualized Educational Video(s) Outcome: Progressing   Problem: Health Behavior/Discharge Planning: Goal: Ability to identify and utilize available resources and services will improve Outcome: Progressing Goal: Ability to manage health-related needs will improve Outcome: Progressing   Problem: Nutritional: Goal: Maintenance of adequate nutrition will improve Outcome: Progressing Goal: Progress toward achieving an optimal weight will improve Outcome: Progressing

## 2024-03-18 NOTE — Plan of Care (Signed)
  Problem: Consults Goal: RH GENERAL PATIENT EDUCATION Description: See Patient Education module for education specifics. Outcome: Progressing   Problem: RH BOWEL ELIMINATION Goal: RH STG MANAGE BOWEL WITH ASSISTANCE Description: STG Manage Bowel with mod I  Assistance. Outcome: Progressing Goal: RH STG MANAGE BOWEL W/MEDICATION W/ASSISTANCE Description: STG Manage Bowel with Medication with mod I Assistance. Outcome: Progressing   Problem: RH BLADDER ELIMINATION Goal: RH STG MANAGE BLADDER WITH ASSISTANCE Description: STG Manage Bladder With toileting / mod I Assistance Outcome: Progressing Goal: RH STG MANAGE BLADDER WITH MEDICATION WITH ASSISTANCE Description: STG Manage Bladder With Medication With mod I Assistance. Outcome: Progressing   Problem: RH SAFETY Goal: RH STG ADHERE TO SAFETY PRECAUTIONS W/ASSISTANCE/DEVICE Description: STG Adhere to Safety Precautions With cues Assistance/Device. Outcome: Progressing   Problem: RH PAIN MANAGEMENT Goal: RH STG PAIN MANAGED AT OR BELOW PT'S PAIN GOAL Description: < 4 with prns Outcome: Progressing   Problem: RH KNOWLEDGE DEFICIT GENERAL Goal: RH STG INCREASE KNOWLEDGE OF SELF CARE AFTER HOSPITALIZATION Description: Patient and wife will be able to manage care at discharge using educational resources for medication, skin care,dietary modification and fall prevention independently Outcome: Progressing   Problem: Education: Goal: Ability to describe self-care measures that may prevent or decrease complications (Diabetes Survival Skills Education) will improve Outcome: Progressing Goal: Individualized Educational Video(s) Outcome: Progressing   Problem: Coping: Goal: Ability to adjust to condition or change in health will improve Outcome: Progressing   Problem: Fluid Volume: Goal: Ability to maintain a balanced intake and output will improve Outcome: Progressing   Problem: Health Behavior/Discharge Planning: Goal: Ability  to identify and utilize available resources and services will improve Outcome: Progressing Goal: Ability to manage health-related needs will improve Outcome: Progressing   Problem: Metabolic: Goal: Ability to maintain appropriate glucose levels will improve Outcome: Progressing   Problem: Nutritional: Goal: Maintenance of adequate nutrition will improve Outcome: Progressing Goal: Progress toward achieving an optimal weight will improve Outcome: Progressing   Problem: Skin Integrity: Goal: Risk for impaired skin integrity will decrease Outcome: Progressing   Problem: Tissue Perfusion: Goal: Adequacy of tissue perfusion will improve Outcome: Progressing

## 2024-03-18 NOTE — Progress Notes (Signed)
 Occupational Therapy Discharge Summary  Patient Details  Name: Christian [name] MRN: [id number] Date of Birth: 1951/05/29  Date of Discharge from OT service:Mar 18, 2024  Today's Date: 03/18/2024 OT Individual Time: 0805-0900 & 1300-1415 OT Individual Time Calculation (min): 55 min & 75 min   Patient has met 8 of 8 long term goals due to improved activity tolerance, improved balance, postural control, and ability to compensate for deficits.  Patient to discharge at overall supervision-Min A level.  Patient's care partner is independent to provide the necessary physical assistance at discharge.    Reasons goals not met: All goals met  Recommendation:  Patient will benefit from ongoing skilled OT services in outpatient setting to continue to advance functional skills in the area of BADL, iADL, and Reduce care partner burden.  Equipment: TTB  Reasons for discharge: treatment goals met and discharge from hospital  Patient/family agrees with progress made and goals achieved: Yes  OT Discharge Precautions/Restrictions  Precautions Precautions: Fall;Back Precaution/Restrictions Comments: LSO, able to recall precautions Required Braces or Orthoses: Spinal Brace Spinal Brace: Lumbar corset;Applied in sitting position Restrictions Weight Bearing Restrictions Per Provider Order: No Pain Pain Assessment Pain Scale: 0-10 Pain Score: 5  Pain Location: Back Pain Descriptors / Indicators: Aching ADL ADL Eating: Modified independent Grooming: Modified independent Where Assessed-Grooming: Sitting at sink Upper Body Bathing: Supervision/safety Where Assessed-Upper Body Bathing: Shower Lower Body Bathing: Supervision/safety Where Assessed-Lower Body Bathing: Shower Upper Body Dressing: Minimal assistance Where Assessed-Upper Body Dressing: Wheelchair, Chair Lower Body Dressing: Supervision/safety Where Assessed-Lower Body Dressing: Edge of bed Toileting: Minimal assistance Where  Assessed-Toileting: Toilet, Bedside Commode Toilet Transfer: Distant supervision Toilet Transfer Method: Stand pivot, Proofreader: Bedside commode, Grab bars Tub/Shower Transfer: Distant supervision Tub/Shower Equipment: Grab bars, Insurance underwriter: Distant supervision Film/video editor Method: Ambulating, Stand pivot Astronomer: Emergency planning/management officer, Grab bars Vision Baseline Vision/History: 1 Wears glasses Patient Visual Report: No change from baseline Vision Assessment?: No apparent visual deficits Perception  Perception: Within Functional Limits Praxis Praxis: WFL Cognition Cognition Overall Cognitive Status: Within Functional Limits for tasks assessed Arousal/Alertness: Awake/alert Orientation Level: Person;Place;Situation Person: Oriented Place: Oriented Situation: Oriented Memory: Appears intact Awareness: Appears intact Problem Solving: Appears intact Safety/Judgment: Appears intact Brief Interview for Mental Status (BIMS) Repetition of Three Words (First Attempt): 3 Temporal Orientation: Year: Correct Temporal Orientation: Month: Accurate within 5 days Temporal Orientation: Day: Correct Recall: "Sock": Yes, no cue required Recall: "Blue": Yes, no cue required Recall: "Bed": Yes, no cue required BIMS Summary Score: 15 Sensation Sensation Peripheral sensation comments: neuropathy at baseline Hot/Cold: Appears Intact Proprioception: Appears Intact Stereognosis: Not tested Coordination Gross Motor Movements are Fluid and Coordinated: No Fine Motor Movements are Fluid and Coordinated: Yes Coordination and Movement Description: dyscoordinated/t pain/weakness L>R, decreased since eval Motor  Motor Motor: Within Functional Limits;Other (comment) Motor - Skilled Clinical Observations: limited by pain, general weakness. Mobility  Bed Mobility Bed Mobility: Supine to Sit;Sit to Supine Supine to  Sit: Independent with assistive device Sit to Supine: Independent with assistive device Transfers Sit to Stand: Supervision/Verbal cueing Stand to Sit: Supervision/Verbal cueing  Trunk/Postural Assessment  Cervical Assessment Cervical Assessment: Within Functional Limits Thoracic Assessment Thoracic Assessment: Exceptions to [facility] (rounded shoulders) Lumbar Assessment Lumbar Assessment: Exceptions to [facility] (posterior pelvic tilt) Postural Control Postural Control: Within Functional Limits  Balance Balance Balance Assessed: Yes Static Sitting Balance Static Sitting - Balance Support: Feet supported Static Sitting - Level of Assistance: 6: Modified independent (Device/Increase  time) Dynamic Sitting Balance Dynamic Sitting - Balance Support: Feet supported Dynamic Sitting - Level of Assistance: 6: Modified independent (Device/Increase time) Dynamic Sitting - Balance Activities: Lateral lean/weight shifting;Forward lean/weight shifting;Reaching for objects Static Standing Balance Static Standing - Balance Support: During functional activity Static Standing - Level of Assistance: 5: Stand by assistance Dynamic Standing Balance Dynamic Standing - Balance Support: During functional activity Dynamic Standing - Level of Assistance: 5: Stand by assistance Dynamic Standing - Balance Activities: Reaching for objects Extremity/Trunk Assessment RUE Assessment RUE Assessment: Exceptions to [facility] General Strength Comments: Deltoids 2-/5 due to RTC tears; biceps 4+/5; Triceps 4+/5; Grip and FA 4+/5 LUE Assessment LUE Assessment: Exceptions to [facility] General Strength Comments: Deltoids 2-/5 due to RTC tears; bicep 5-/5 on L; Triceps  5-/5 on L; Grip  5-/5 on L-   Session 1 General: "Hello there!" Pt supine in bed upon OT arrival, agreeable to OT session.  Pain:  6/10 pain reported in low back, activity, intermittent rest breaks, distractions provided for pain management, pt reports tolerable to  proceed.   ADL: OT providing handout for energy conservation describing what it is, purpose of conserving energy and specific examples of how to conserve energy during ADL and IADL tasks. Pt appreciative for information with good carryover.  Exercises: OT issued HEP in order to increase functional strength, endurance and activity tolerance in order to increase independence in ADLs such as bathing. Pt issued yellow theraband and discussed direction/technique of exercises, demonstrating verbal understanding. Pt exercises listed below: -sit to stands -bicep curls  -shoulder flexion -seated marches   Other Treatments: OT providing kinesio-taping techniques handouts to patient d/t pt reporting taping has assisting with decreasing pain in bilateral shoulders. OT discussing technique and encouraging watching credible videos online for specific technique and applying correct amount of stretch to tape for increased stability.    Pt supine in bed with bed alarm activated, 2 bed rails up, call light within reach and 4Ps assessed.   Session 2 General: "It has been great working with you!" Pt supine in bed upon OT arrival, agreeable to OT session.  Pain: see above  ADL: OT providing skilled intervention with ADL tasks in order to prepare for D/C tomorrow. Pt completed all tasks listed above at listed levels of assist. Pt reported feeling confident with skills learned in therapies and feels good about going home tomorrow. OT re-iterating safe use of RW once home for fall prevention.    Pt supine in bed with bed alarm activated, 2 bed rails up, call light within reach and 4Ps assessed.   Nila Barth, OTD, OTR/L 03/18/2024, 3:59 PM

## 2024-03-18 NOTE — Progress Notes (Signed)
 Physical Therapy Discharge Summary  Patient Details  Name: Christian [name] MRN: [id number] Date of Birth: Jan 28, 1951  Date of Discharge from PT service:Mar 18, 2024  Today's Date: 03/18/2024 PT Individual Time: 1500-1530 PT Individual Time Calculation (min): 30 min    Patient has met 9 of 9 long term goals due to improved activity tolerance, improved balance, increased strength, and ability to compensate for deficits.  Patient to discharge at an ambulatory level Supervision.   Patient's care partner is independent to provide the necessary physical assistance at discharge.  Reasons goals not met: NA  Recommendation:  Patient will benefit from ongoing skilled PT services in outpatient setting to continue to advance safe functional mobility, address ongoing impairments in strength, ROM, balance, endurance, and minimize fall risk.  Equipment: RW  Reasons for discharge: treatment goals met and discharge from hospital  Patient/family agrees with progress made and goals achieved: Yes  Skilled Therapeutic Interventions/Progress Updates:  Pt recd in bed. Reports unrated knee pain with LLE mobility and intermittent back pain, premedicated. Rest and positioning provided as needed.   Bed mobility mod I with and without bed rail, discussed pt's home set up and benefit of bedrail for safety and ease of transfer, pt states his wife has talked about getting one in the past. Performed MMT with pt seated EOB. Discussed home set up, plan for outings after d/c, and activity levels post d/c. Recommended pt get up to move around the house ~1x/hour to maintain endurance and strength gains from rehab.   Pt participated in bed level therex for LE strength including BKFO and supine marches. Pt was limited by pain and knee ROM.   Pt remained in bed at end of session and was left with all needs in reach and alarm active.   PT Discharge Precautions/Restrictions Precautions Precautions:  Fall;Back Precaution/Restrictions Comments: LSO, able to recall precautions Required Braces or Orthoses: Spinal Brace Spinal Brace: Lumbar corset;Applied in sitting position Restrictions Weight Bearing Restrictions Per Provider Order: No  Pain Pain Assessment Pain Scale: 0-10 Pain Score: 5  Pain Location: Back Pain Descriptors / Indicators: Aching Pain Interference Pain Interference Pain Effect on Sleep: 2. Occasionally Pain Interference with Therapy Activities: 2. Occasionally Pain Interference with Day-to-Day Activities: 1. Rarely or not at all Vision/Perception  Vision - History Ability to See in Adequate Light: 0 Adequate Perception Perception: Within Functional Limits Praxis Praxis: WFL  Cognition Overall Cognitive Status: Within Functional Limits for tasks assessed Arousal/Alertness: Awake/alert Orientation Level: Oriented X4 Memory: Appears intact Awareness: Appears intact Problem Solving: Appears intact Safety/Judgment: Appears intact Sensation Sensation Light Touch: Impaired Detail Peripheral sensation comments: neuropathy at baseline Light Touch Impaired Details: Impaired LLE;Impaired RLE Hot/Cold: Appears Intact Proprioception: Appears Intact Stereognosis: Not tested Coordination Gross Motor Movements are Fluid and Coordinated: No Fine Motor Movements are Fluid and Coordinated: Yes Coordination and Movement Description: dyscoordinated/t pain/weakness L>R, decreased since eval Motor  Motor Motor: Within Functional Limits;Other (comment) Motor - Skilled Clinical Observations: limited by pain, general weakness.  Mobility Bed Mobility Bed Mobility: Supine to Sit;Sit to Supine Supine to Sit: Independent with assistive device Sit to Supine: Independent with assistive device Transfers Transfers: Sit to Stand;Stand to Sit;Stand Pivot Transfers Sit to Stand: Supervision/Verbal cueing Stand to Sit: Supervision/Verbal cueing Stand Pivot Transfers:  Supervision/Verbal cueing Stand Pivot Transfer Details: Verbal cues for safe use of DME/AE Transfer (Assistive device): Rolling walker Locomotion  Gait Ambulation: Yes Gait Assistance: Supervision/Verbal cueing Gait Distance (Feet): 150 Feet Assistive device: Rolling walker Gait Assistance Details:  cues for posture limited by pain Gait Gait: Yes Gait Pattern: Antalgic Stairs / Additional Locomotion Stairs: Yes Stairs Assistance: Contact Guard/Touching assist Stair Management Technique: With cane;One rail Left Number of Stairs: 8 Height of Stairs: 3 Ramp: Supervision/Verbal cueing Wheelchair Mobility Wheelchair Mobility: Yes Wheelchair Assistance: Independent with Scientist, research (life sciences): Both upper extremities Wheelchair Parts Management: Supervision/cueing  Trunk/Postural Assessment  Cervical Assessment Cervical Assessment: Within Functional Limits Thoracic Assessment Thoracic Assessment: Exceptions to [facility] Lumbar Assessment Lumbar Assessment: Exceptions to [facility] Postural Control Postural Control: Within Functional Limits Trunk Control: weakness  Balance Balance Balance Assessed: Yes Static Sitting Balance Static Sitting - Balance Support: Feet supported Static Sitting - Level of Assistance: 6: Modified independent (Device/Increase time) Dynamic Sitting Balance Dynamic Sitting - Balance Support: Feet supported Dynamic Sitting - Level of Assistance: 6: Modified independent (Device/Increase time) Dynamic Sitting - Balance Activities: Lateral lean/weight shifting;Forward lean/weight shifting;Reaching for objects Static Standing Balance Static Standing - Balance Support: During functional activity Static Standing - Level of Assistance: 5: Stand by assistance Dynamic Standing Balance Dynamic Standing - Balance Support: During functional activity Dynamic Standing - Level of Assistance: 5: Stand by assistance Dynamic Standing - Balance Activities: Reaching for  objects Extremity Assessment  RUE Assessment RUE Assessment: Exceptions to [facility] General Strength Comments: Deltoids 2-/5 due to RTC tears; biceps 4+/5; Triceps 4+/5; Grip and FA 4+/5 LUE Assessment LUE Assessment: Exceptions to [facility] General Strength Comments: Deltoids 2-/5 due to RTC tears; bicep 5-/5 on L; Triceps  5-/5 on L; Grip  5-/5 on L- RLE Assessment RLE Assessment: Exceptions to [facility] General Strength Comments: Grossly 4+ to 5/5 LLE Assessment LLE Assessment: Exceptions to [facility] Passive Range of Motion (PROM) Comments: limited by pain, lacking 30 deg extension General Strength Comments: hip flexion and knee extension 3-/5 limited by pain, remaining muscle groups 5/5   Christian [name] 03/18/2024, 3:20 PM

## 2024-03-18 NOTE — Plan of Care (Signed)
  Problem: RH Balance Goal: LTG Patient will maintain dynamic standing with ADLs (OT) Description: LTG:  Patient will maintain dynamic standing balance with assist during activities of daily living (OT)  Outcome: Completed/Met Flowsheets (Taken 03/08/2024 1329 by Myra Aschoff, OT) LTG: Pt will maintain dynamic standing balance during ADLs with: Supervision/Verbal cueing   Problem: Sit to Stand Goal: LTG:  Patient will perform sit to stand in prep for activites of daily living with assistance level (OT) Description: LTG:  Patient will perform sit to stand in prep for activites of daily living with assistance level (OT) Outcome: Completed/Met Flowsheets (Taken 03/13/2024 1630) LTG: PT will perform sit to stand in prep for activites of daily living with assistance level: (upgraded 2/2 progress) Supervision/Verbal cueing   Problem: RH Bathing Goal: LTG Patient will bathe all body parts with assist levels (OT) Description: LTG: Patient will bathe all body parts with assist levels (OT) Outcome: Completed/Met Flowsheets (Taken 03/08/2024 1329 by Myra Aschoff, OT) LTG: Pt will perform bathing with assistance level/cueing: Minimal Assistance - Patient > 75%   Problem: RH Dressing Goal: LTG Patient will perform upper body dressing (OT) Description: LTG Patient will perform upper body dressing with assist, with/without cues (OT). Outcome: Completed/Met Flowsheets (Taken 03/13/2024 1630) LTG: Pt will perform upper body dressing with assistance level of: (downgraded d/t shoulder limitations) Minimal Assistance - Patient > 75% Goal: LTG Patient will perform lower body dressing w/assist (OT) Description: LTG: Patient will perform lower body dressing with assist, with/without cues in positioning using equipment (OT) Outcome: Completed/Met Flowsheets (Taken 03/08/2024 1329 by Myra Aschoff, OT) LTG: Pt will perform lower body dressing with assistance level of: Supervision/Verbal cueing   Problem:  RH Toileting Goal: LTG Patient will perform toileting task (3/3 steps) with assistance level (OT) Description: LTG: Patient will perform toileting task (3/3 steps) with assistance level (OT)  Outcome: Completed/Met Flowsheets (Taken 03/13/2024 1630) LTG: Pt will perform toileting task (3/3 steps) with assistance level: (upgraded 2/2 progress) Minimal Assistance - Patient > 75%   Problem: RH Toilet Transfers Goal: LTG Patient will perform toilet transfers w/assist (OT) Description: LTG: Patient will perform toilet transfers with assist, with/without cues using equipment (OT) Outcome: Completed/Met Flowsheets (Taken 03/08/2024 1329 by Myra Aschoff, OT) LTG: Pt will perform toilet transfers with assistance level of: Supervision/Verbal cueing   Problem: RH Tub/Shower Transfers Goal: LTG Patient will perform tub/shower transfers w/assist (OT) Description: LTG: Patient will perform tub/shower transfers with assist, with/without cues using equipment (OT) Outcome: Completed/Met Flowsheets (Taken 03/13/2024 1630) LTG: Pt will perform tub/shower stall transfers with assistance level of: (upgraded 2/2 progress) Supervision/Verbal cueing

## 2024-03-18 NOTE — Progress Notes (Addendum)
 PROGRESS NOTE   Subjective/Complaints:  No new concerns or complaints this morning.  Reports his lower back pain is under control.  LBM yesterday.  Denies any concerns with emptying his bladder.    ROS:   Pt denies fever, chills, SOB, abd pain, CP, N/V/C/D, and vision changes     Objective:   No results found. Recent Labs    03/18/24 0608  WBC 13.7*  HGB 11.4*  HCT 35.0*  PLT 320     Recent Labs    03/18/24 0608  NA 138  K 3.8  CL 105  CO2 26  GLUCOSE 161*  BUN 13  CREATININE 0.79  CALCIUM 8.7*      Intake/Output Summary (Last 24 hours) at 03/18/2024 1108 Last data filed at 03/18/2024 0723 Gross per 24 hour  Intake 236 ml  Output --  Net 236 ml        Physical Exam: Vital Signs Blood pressure 131/68, pulse 90, temperature 98.2 F (36.8 C), temperature source Oral, resp. rate 18, height 5\' 6"  (1.676 m), weight (!) 136.2 kg, SpO2 99%.   General: awake, alert, appropriate, obese; NAD, laying in bed, appears comfortable HENT: conjugate gaze; oropharynx moist CV: regular rate and rhythm; no JVD Pulmonary: CTA B/L; nonlabored breathing GI: soft, NT, ND, (+)BS Psychiatric: appropriate, pleasant Neurological: Ox3 Skin- Spinal incision CDI-no signs of infection noted, no drainage, please see image Extremities- trace LE edema B/L  Neuro: Altered sensation bilateral lower extremites mid calf and below, moving all extremities in bed    PRIOR EXAMS: Musculoskeletal:     Cervical back: Neck supple. No tenderness.     Right lower leg: No edema.     Comments: Deltoids 2-/5 due to probable RTC tears; biceps 4+/5 on R 5-/5 on L; Triceps 4+/5 on R and 5-/5 on L; Grip and FA 4+/5 on R and 5-/5 on L- but very limited by having to do hand walking up body for shoulders LE's- RLE- HF 4-/5; KE 4+/5; DF/PF 5-/5 LLE- HF 2-/5; cannot extend or flex L knee due to "bad L knee"; 4+/5 DF and PF Literally cannot  extend L knee past 45 degrees, but says can when standing with assistive device  Skin:    General: Skin is warm and dry.     Comments: Incision with some sanguinous drainage- tegaderm placed with dressing underneath it  Neurological:     Mental Status: He is alert and oriented to person, place, and time.     Comments: Decreased to light touch B/L at midcalf- worse on RLE to light touch and pinprick- absent to pinprick on bottom of feet   Assessment/Plan: 1. Functional deficits which require 3+ hours per day of interdisciplinary therapy in a comprehensive inpatient rehab setting. Physiatrist is providing close team supervision and 24 hour management of active medical problems listed below. Physiatrist and rehab team continue to assess barriers to discharge/monitor patient progress toward functional and medical goals  Care Tool:  Bathing        Body parts bathed by helper: Right arm, Left upper leg, Left arm, Right lower leg, Chest, Abdomen, Left lower leg, Front perineal area, Buttocks, Right upper leg  Bathing assist Assist Level: Total Assistance - Patient < 25%     Upper Body Dressing/Undressing Upper body dressing   What is the patient wearing?: Pull over shirt    Upper body assist Assist Level: Total Assistance - Patient < 25%    Lower Body Dressing/Undressing Lower body dressing      What is the patient wearing?: Underwear/pull up, Pants     Lower body assist Assist for lower body dressing: Total Assistance - Patient < 25%     Toileting Toileting Toileting Activity did not occur Press photographer and hygiene only): N/A (no void or bm)  Toileting assist Assist for toileting: Maximal Assistance - Patient 25 - 49%     Transfers Chair/bed transfer  Transfers assist     Chair/bed transfer assist level: Supervision/Verbal cueing     Locomotion Ambulation   Ambulation assist   Ambulation activity did not occur: Safety/medical concerns  Assist level:  Supervision/Verbal cueing Assistive device: Walker-rolling Max distance: 150   Walk 10 feet activity   Assist  Walk 10 feet activity did not occur: Safety/medical concerns  Assist level: Supervision/Verbal cueing Assistive device: Walker-rolling   Walk 50 feet activity   Assist Walk 50 feet with 2 turns activity did not occur: Safety/medical concerns  Assist level: Supervision/Verbal cueing Assistive device: Walker-rolling    Walk 150 feet activity   Assist Walk 150 feet activity did not occur: Safety/medical concerns  Assist level: Supervision/Verbal cueing Assistive device: Walker-rolling    Walk 10 feet on uneven surface  activity   Assist Walk 10 feet on uneven surfaces activity did not occur: Safety/medical concerns   Assist level: Supervision/Verbal cueing     Wheelchair     Assist Is the patient using a wheelchair?: Yes Type of Wheelchair: Manual Wheelchair activity did not occur: Safety/medical concerns         Wheelchair 50 feet with 2 turns activity    Assist    Wheelchair 50 feet with 2 turns activity did not occur: Safety/medical concerns       Wheelchair 150 feet activity     Assist  Wheelchair 150 feet activity did not occur: Safety/medical concerns       Blood pressure 131/68, pulse 90, temperature 98.2 F (36.8 C), temperature source Oral, resp. rate 18, height 5\' 6"  (1.676 m), weight (!) 136.2 kg, SpO2 99%.  Medical Problem List and Plan: 1. Functional deficits secondary to severe lumbar stenosis with B/L LE radiculopathy s/p PSF at L4-S1             -patient may  shower -ELOS/Goals: 10-12 days min A- was using scooter outside home prior- had been bedbound for 6 weeks prior to admission            D/c 5/13 - wife worried that  wasn't doing great at home and anxious about d/c- per SW, have spoken with her and explained will need RW at home.  Con't CIR PT and OT- will change back dressing- placed order to change not  just reinforce current dressing 2.  Antithrombotics: -DVT/anticoagulation:  Mechanical: Sequential compression devices, entire leg Bilateral lower extremities -will need to check with surgeon at 7 days s/p surgery when can start DVT prophylaxis 5/2- will call Surgeon Monday to get ok to put on Lovenox  5/5- asked PA to get ahold of surgeon to start lovenox  5/6- will d/w PA about getting ahold of surgeon- if we cannot, it's been enough time, will start Lovenox - will let pharmacy dose- for prophyalxis-  has been 9 days since surgery             -antiplatelet therapy: ASA 81mg  daily 3. Pain Management:  tylenol  650mg  QID. Voltaren  gel QID. Cymbalta  60mg  daily. Hydrocodone  ineffective--will change to oxycodone  which he has tried in the past. 5/8- pt reports no real pain this AM- just stiffness- con't regimen, but advised pt to use Robaxin  as needed 4. Mood/Behavior/Sleep: LCSW to follow for evaluation and support.             -antipsychotic agents: N/A 5. Neuropsych/cognition: This patient is capable of making decisions on his own behalf. 6. Skin/Wound Care: Routine pressure relief measures.              --monitor drainage from wound. Dry dressing daily.   5/8- No drainage on dressing  5/9- dressing reinforced- asked them ot change it completely 7. Fluids/Electrolytes/Nutrition: Monitor I/O and routine labs  8. T2DM w/neuropathy: Hgb A1c-5.9 and better controlled. On Novolin 100 units am/30 units pm prn BS>190 and Metformin  2000 mg daily am.  --Monitor BS ac/hs. Followed by Dr. Marianna Shirk --Currently on NPH 10 units bid w/novolog  6 units ac TID/HS?             --will d/c hs coverage and use SSI for now             --Resume metformin  in am and d/c NPH to help transition to home regimen. -Can use Libre to monitor CBGs  5/2- will get pt back on some of home dose and titrate over-  -5/3-4/25 CBGs pretty good, cont regimen as is for now.  5/5- CBG's running 180's to 220's- doing better- con't  to monitor 5/6- Bgs doing better- 159-204- will con't regimen and monitor trend 5/8- Cbgs running a little higher 190s- 253- on home dose- pt doesn't want to change meds 5/10-11/25 CBGs mostly <200, cont regimen 5/12 novolog  was increased to 10u TID, will  monitor response CBG (last 3)  Recent Labs    03/16/24 0550 03/17/24 1201 03/17/24 1726  GLUCAP 176* 198* 159*    9.  ABLA: Has had drop in Hgb from 14.0 to 11.2 due to post op anemia             --monitor H/H for trend. Monitor for signs of bleeding. 5/2- Hb 10.7- will monitor for bleeding- incision shows scant drainage, but not enough to explain drop- likely due to not being checked for a few days  5/5- Hb up to 11k 5/8- Hb 11.3k 5/12  HGB stable 11.4 10. Leucocytosis: WBC up to 13.3--likely reactive. Monitor for fevers and other signs of infection  5/2- WBC down to 10.4k- monitor clinically 5/5- WBC up to 12.2 k- this AM- no Sx's of illness per pt- will monitor clinically. 5/7- will recheck Thursday- incision not really draining            5/8- WBC down to 10.9 from 12.2- will con't to monitor clinically  5/12 WBC up to 13.7, no signs infection noted, recheck tomorrow    11. Hx of  DOE:  Echo 02/27/24 showed Moderate to severe concentric hypertrophy with EF 45-50% and mild LV hypokinesis. --hx of mild asymptomatic tachycardia per PCP notes. Cont metoprolol  XL 25mg  daily.  12. 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  Dean 15. Morbid obesity-BMI 48.46: RD consult for dietary education.  --Encourage/educate on importance of diet and wt loss to help promote health and mobility.  16. Urinary retention- came to use with Foley catheter- will remove in AM and do bladder scans to make sure is emptying-cath if volumes >350cc with coude' 5/2- removed  foley this AM- hasn't been 6 hours yet, but in 1-2 hours, will need to be cathed if hasn't voided -03/09/24 low PVRs overnight, monitor. Continue flomax  0.4mg  nightly 5/5- peeing well- con't regimen 5/6- no issues- will stop bladder scans  5/12 remains continent 17. Constipation- LBM 5 days ago- pt refuses Sorbitol  because scared of diarrhea/accidents. Continue miralax  BID and senokot 2 tabs qAM -03/17/24 LBM this morning per pt, monitor 5/12 LBM yesterday-continue to monitor 18. GI PPx: Protonix  20mg  daily   LOS: 11 days A FACE TO FACE EVALUATION WAS PERFORMED  Lylia Sand 03/18/2024, 11:08 AM

## 2024-03-18 NOTE — Progress Notes (Signed)
 Physical Therapy Session Note  Patient Details  Name: Christian [name] MRN: [id number] Date of Birth: 09/11/1951  Today's Date: 03/18/2024 PT Individual Time: 7829-5621 PT Individual Time Calculation (min): 30 min   Short Term Goals: Week 1:  PT Short Term Goal 1 (Week 1): pt will ambulate 100 ft with LRAD PT Short Term Goal 1 - Progress (Week 1): Met PT Short Term Goal 2 (Week 1): Pt will perform STS with CGA PT Short Term Goal 2 - Progress (Week 1): Met PT Short Term Goal 3 (Week 1): Pt will maintain back precautions without cue throughout sessions. PT Short Term Goal 3 - Progress (Week 1): Met  Skilled Therapeutic Interventions/Progress Updates: Pt presents supine in bed and agreeable to therapy, pt stating pain in back at 4/10 although increased to 7/10 w/ activity, pain meds already received.  Pt transfers fro slightly elevated head similar to home (has a wedge for under pillows).  Pt transfers sit to stand w/ supervision fromstandard height bed and amb x 150' w/ supervision and RW.  Pt transferred in/out of [employer] height SUV w/ supervision.  Pt negotiated ramped surface and RW w/ supervision only.  Pt returned to room and amb to bed w/ RW and then transfers sit to supine w/ mod I.  Bed alarm on and all needs in reach.     Therapy Documentation Precautions:  Precautions Precautions: Fall, Back Precaution/Restrictions Comments: LSO, able to recall precautions Required Braces or Orthoses: Spinal Brace Spinal Brace: Lumbar corset, Applied in sitting position Restrictions Weight Bearing Restrictions Per Provider Order: No General:   Vital Signs:   Pain:7/10 but initially 4/10 to LB, received pain meds prior toTherapy. Pain Assessment Pain Scale: 0-10 Pain Score: 5  Pain Location: Back Pain Intervention(s): Medication (See eMAR)    Therapy/Group: Individual Therapy  Gay Rape P Marko Skalski 03/18/2024, 9:48 AM

## 2024-03-18 NOTE — Progress Notes (Signed)
 Novolog  U 500 increased to 10 units TID (PTA-  he used 80 units in am and 40 in pm) as BS still elevated. Likely will need to titrate up to home dose after discharge.

## 2024-03-19 DIAGNOSIS — M48062 Spinal stenosis, lumbar region with neurogenic claudication: Secondary | ICD-10-CM | POA: Diagnosis not present

## 2024-03-19 DIAGNOSIS — D62 Acute posthemorrhagic anemia: Secondary | ICD-10-CM | POA: Insufficient documentation

## 2024-03-19 DIAGNOSIS — D72829 Elevated white blood cell count, unspecified: Secondary | ICD-10-CM | POA: Insufficient documentation

## 2024-03-19 LAB — CBC
HCT: 34.1 % — ABNORMAL LOW (ref 39.0–52.0)
Hemoglobin: 11.2 g/dL — ABNORMAL LOW (ref 13.0–17.0)
MCH: 32.1 pg (ref 26.0–34.0)
MCHC: 32.8 g/dL (ref 30.0–36.0)
MCV: 97.7 fL (ref 80.0–100.0)
Platelets: 279 10*3/uL (ref 150–400)
RBC: 3.49 MIL/uL — ABNORMAL LOW (ref 4.22–5.81)
RDW: 13.7 % (ref 11.5–15.5)
WBC: 11.4 10*3/uL — ABNORMAL HIGH (ref 4.0–10.5)
nRBC: 0 % (ref 0.0–0.2)

## 2024-03-19 NOTE — Progress Notes (Signed)
 Inpatient Rehabilitation Discharge Medication Review by a Pharmacist  A complete drug regimen review was completed for this patient to identify any potential clinically significant medication issues.  High Risk Drug Classes Is patient taking? Indication by Medication  Antipsychotic No   Anticoagulant No   Antibiotic No   Opioid Yes Tramadol -  pain  Antiplatelet Yes Aspirin - cva ppx  Hypoglycemics/insulin  Yes Insulin  regular concentrated U-500 kwikpen, metformin - T2DM  Vasoactive Medication Yes Toprol - HTN Flomax - BPH  Chemotherapy No   Other Yes Cymbalta - neuropathic pain Acetaminophen , diclofenac  gel- pain management Diphenhydramine - itching Omeprazole - GERD Miralax , senokot S- constipation/bowel regimen Melatonin- sleep Multivitamin - supplement Robaxin - muscle spasms     Type of Medication Issue Identified Description of Issue Recommendation(s)  Drug Interaction(s) (clinically significant)     Duplicate Therapy     Allergy     No Medication Administration End Date     Incorrect Dose     Additional Drug Therapy Needed     Significant med changes from prior encounter (inform family/care partners about these prior to discharge).    Other       Clinically significant medication issues were identified that warrant physician communication and completion of prescribed/recommended actions by midnight of the next day:  No   Time spent performing this drug regimen review (minutes): 25     Alisa Irish, RPh Clinical Pharmacist 03/19/2024 9:53 AM

## 2024-03-19 NOTE — Progress Notes (Signed)
 Back incision is cover with dry dressing. Incision looks clean,dry and intact. No signs of infection noted.

## 2024-03-19 NOTE — Progress Notes (Signed)
 PROGRESS NOTE   Subjective/Complaints:  Pt reports feeling good- denies any issues- LBM yesterday  WBC 11.4 down from 13.7 ROS:   Pt denies SOB, abd pain, CP, N/V/C/D, and vision changes    Objective:   No results found. Recent Labs    03/18/24 0608 03/19/24 0534  WBC 13.7* 11.4*  HGB 11.4* 11.2*  HCT 35.0* 34.1*  PLT 320 279     Recent Labs    03/18/24 0608  NA 138  K 3.8  CL 105  CO2 26  GLUCOSE 161*  BUN 13  CREATININE 0.79  CALCIUM 8.7*      Intake/Output Summary (Last 24 hours) at 03/19/2024 0914 Last data filed at 03/19/2024 0800 Gross per 24 hour  Intake 1188 ml  Output --  Net 1188 ml        Physical Exam: Vital Signs Blood pressure (!) 144/73, pulse 86, temperature 97.7 F (36.5 C), resp. rate 17, height 5\' 6"  (1.676 m), weight (!) 136.2 kg, SpO2 96%.     General: awake, but woke him up- asked to go back to sleep; NAD HENT: conjugate gaze; oropharynx moist CV: regular rate and rhythm; no JVD Pulmonary: CTA B/L; no W/R/R- good air movement GI: soft, NT, ND, (+)BS- protuberant- weight 136 kg Psychiatric: appropriate but sleepy Neurological: Ox3  Skin- Spinal incision CDI-no signs of infection noted, no drainage, please see image Extremities- trace LE edema B/L  Neuro: Altered sensation bilateral lower extremites mid calf and below, moving all extremities in bed    PRIOR EXAMS: Musculoskeletal:     Cervical back: Neck supple. No tenderness.     Right lower leg: No edema.     Comments: Deltoids 2-/5 due to probable RTC tears; biceps 4+/5 on R 5-/5 on L; Triceps 4+/5 on R and 5-/5 on L; Grip and FA 4+/5 on R and 5-/5 on L- but very limited by having to do hand walking up body for shoulders LE's- RLE- HF 4-/5; KE 4+/5; DF/PF 5-/5 LLE- HF 2-/5; cannot extend or flex L knee due to "bad L knee"; 4+/5 DF and PF Literally cannot extend L knee past 45 degrees, but says can when  standing with assistive device  Skin:    General: Skin is warm and dry.     Comments: Incision with some sanguinous drainage- tegaderm placed with dressing underneath it  Neurological:     Mental Status: He is alert and oriented to person, place, and time.     Comments: Decreased to light touch B/L at midcalf- worse on RLE to light touch and pinprick- absent to pinprick on bottom of feet   Assessment/Plan: 1. Functional deficits which require 3+ hours per day of interdisciplinary therapy in a comprehensive inpatient rehab setting. Physiatrist is providing close team supervision and 24 hour management of active medical problems listed below. Physiatrist and rehab team continue to assess barriers to discharge/monitor patient progress toward functional and medical goals  Care Tool:  Bathing    Body parts bathed by patient: Right arm, Right lower leg, Left lower leg, Left arm, Chest, Abdomen, Face, Front perineal area, Buttocks, Right upper leg, Left upper leg   Body parts bathed  by helper: Right arm, Left upper leg, Left arm, Right lower leg, Chest, Abdomen, Left lower leg, Front perineal area, Buttocks, Right upper leg     Bathing assist Assist Level: Supervision/Verbal cueing     Upper Body Dressing/Undressing Upper body dressing   What is the patient wearing?: Pull over shirt    Upper body assist Assist Level: Minimal Assistance - Patient > 75%    Lower Body Dressing/Undressing Lower body dressing      What is the patient wearing?: Underwear/pull up, Pants     Lower body assist Assist for lower body dressing: Supervision/Verbal cueing     Toileting Toileting Toileting Activity did not occur (Clothing management and hygiene only): N/A (no void or bm)  Toileting assist Assist for toileting: Minimal Assistance - Patient > 75%     Transfers Chair/bed transfer  Transfers assist     Chair/bed transfer assist level: Supervision/Verbal cueing      Locomotion Ambulation   Ambulation assist   Ambulation activity did not occur: Safety/medical concerns  Assist level: Supervision/Verbal cueing Assistive device: Walker-rolling Max distance: 150   Walk 10 feet activity   Assist  Walk 10 feet activity did not occur: Safety/medical concerns  Assist level: Supervision/Verbal cueing Assistive device: Walker-rolling   Walk 50 feet activity   Assist Walk 50 feet with 2 turns activity did not occur: Safety/medical concerns  Assist level: Supervision/Verbal cueing Assistive device: Walker-rolling    Walk 150 feet activity   Assist Walk 150 feet activity did not occur: Safety/medical concerns  Assist level: Supervision/Verbal cueing Assistive device: Walker-rolling    Walk 10 feet on uneven surface  activity   Assist Walk 10 feet on uneven surfaces activity did not occur: Safety/medical concerns   Assist level: Supervision/Verbal cueing     Wheelchair     Assist Is the patient using a wheelchair?: Yes Type of Wheelchair: Manual Wheelchair activity did not occur: Safety/medical concerns         Wheelchair 50 feet with 2 turns activity    Assist    Wheelchair 50 feet with 2 turns activity did not occur: Safety/medical concerns       Wheelchair 150 feet activity     Assist  Wheelchair 150 feet activity did not occur: Safety/medical concerns       Blood pressure (!) 144/73, pulse 86, temperature 97.7 F (36.5 C), resp. rate 17, height 5\' 6"  (1.676 m), weight (!) 136.2 kg, SpO2 96%.  Medical Problem List and Plan: 1. Functional deficits secondary to severe lumbar stenosis with B/L LE radiculopathy s/p PSF at L4-S1             -patient may  shower -ELOS/Goals: 10-12 days min A- was using scooter outside home prior- had been bedbound for 6 weeks prior to admission            D/c 5/13 - wife worried that  wasn't doing great at home and anxious about d/c- per SW, have spoken with her and  explained will need RW at home.  D/c today- WBC down to 11.4- no signs of infection 2.  Antithrombotics: -DVT/anticoagulation:  Mechanical: Sequential compression devices, entire leg Bilateral lower extremities -will need to check with surgeon at 7 days s/p surgery when can start DVT prophylaxis 5/2- will call Surgeon Monday to get ok to put on Lovenox  5/5- asked PA to get ahold of surgeon to start lovenox  5/6- will d/w PA about getting ahold of surgeon- if we cannot, it's been enough time,  will start Lovenox - will let pharmacy dose- for prophyalxis- has been 9 days since surgery             -antiplatelet therapy: ASA 81mg  daily 3. Pain Management:  tylenol  650mg  QID. Voltaren  gel QID. Cymbalta  60mg  daily. Hydrocodone  ineffective--will change to oxycodone  which he has tried in the past. 5/8- pt reports no real pain this AM- just stiffness- con't regimen, but advised pt to use Robaxin  as needed 4. Mood/Behavior/Sleep: LCSW to follow for evaluation and support.             -antipsychotic agents: N/A 5. Neuropsych/cognition: This patient is capable of making decisions on his own behalf. 6. Skin/Wound Care: Routine pressure relief measures.              --monitor drainage from wound. Dry dressing daily.   5/8- No drainage on dressing  5/9- dressing reinforced- asked them ot change it completely 7. Fluids/Electrolytes/Nutrition: Monitor I/O and routine labs  8. T2DM w/neuropathy: Hgb A1c-5.9 and better controlled. On Novolin 100 units am/30 units pm prn BS>190 and Metformin  2000 mg daily am.  --Monitor BS ac/hs. Followed by Dr. Marianna Shirk --Currently on NPH 10 units bid w/novolog  6 units ac TID/HS?             --will d/c hs coverage and use SSI for now             --Resume metformin  in am and d/c NPH to help transition to home regimen. -Can use Libre to monitor CBGs  5/2- will get pt back on some of home dose and titrate over-  -5/3-4/25 CBGs pretty good, cont regimen as is for now.   5/5- CBG's running 180's to 220's- doing better- con't to monitor 5/6- Bgs doing better- 159-204- will con't regimen and monitor trend 5/8- Cbgs running a little higher 190s- 253- on home dose- pt doesn't want to change meds 5/10-11/25 CBGs mostly <200, cont regimen 5/12 novolog  was increased to 10u TID, will  monitor response 5/13- Cbgs looking better- can slowly increase back to home dose when d/c'd CBG (last 3)  Recent Labs    03/17/24 1201 03/17/24 1726  GLUCAP 198* 159*    9.  ABLA: Has had drop in Hgb from 14.0 to 11.2 due to post op anemia             --monitor H/H for trend. Monitor for signs of bleeding. 5/2- Hb 10.7- will monitor for bleeding- incision shows scant drainage, but not enough to explain drop- likely due to not being checked for a few days  5/5- Hb up to 11k 5/8- Hb 11.3k 5/12  HGB stable 11.4 5/13- Hb 11.2 10. Leucocytosis: WBC up to 13.3--likely reactive. Monitor for fevers and other signs of infection  5/2- WBC down to 10.4k- monitor clinically 5/5- WBC up to 12.2 k- this AM- no Sx's of illness per pt- will monitor clinically. 5/7- will recheck Thursday- incision not really draining            5/8- WBC down to 10.9 from 12.2- will con't to monitor clinically  5/12 WBC up to 13.7, no signs infection noted, recheck tomorrow   5/13- WBC down to 11.4-  still no signs of  infection- no UTI or URI Sx's 11. Hx of  DOE:  Echo 02/27/24 showed Moderate to severe concentric hypertrophy with EF 45-50% and mild LV hypokinesis. --hx of mild asymptomatic tachycardia per PCP notes. Cont metoprolol  XL 25mg  daily.  12. OSA: Does not  use CPAP  13. 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 Rozelle Corning 15. Morbid obesity-BMI 48.46: RD consult for dietary education.  --Encourage/educate on importance of diet and wt loss to help  promote health and mobility.  16. Urinary retention- came to use with Foley catheter- will remove in AM and do bladder scans to make sure is emptying-cath if volumes >350cc with coude' 5/2- removed foley this AM- hasn't been 6 hours yet, but in 1-2 hours, will need to be cathed if hasn't voided -03/09/24 low PVRs overnight, monitor. Continue flomax  0.4mg  nightly 5/5- peeing well- con't regimen 5/6- no issues- will stop bladder scans 5/12 remains continent 17. Constipation- LBM 5 days ago- pt refuses Sorbitol  because scared of diarrhea/accidents. Continue miralax  BID and senokot 2 tabs qAM -03/17/24 LBM this morning per pt, monitor 5/12 LBM yesterday-continue to monitor  5/13- LBM yesterday 18. GI PPx: Protonix  20mg  daily    I spent a total of  32  minutes on total care today- >50% coordination of care- due to  D/w PA and nursing about incision- no signs of infection seen- incision healing well- denies URI or UTI Sx's-  Will have pt go back slowly to home dose of insulin .   LOS: 12 days A FACE TO FACE EVALUATION WAS PERFORMED  Phoenyx Paulsen 03/19/2024, 9:14 AM

## 2024-03-20 ENCOUNTER — Telehealth: Payer: Self-pay

## 2024-03-20 NOTE — Telephone Encounter (Signed)
 Transitional Care Call--who you spoke with    Are you/is patient experiencing any problems since coming home? None reported. Are there any questions regarding any aspect of care? None reported, just soreness in both knees. Are there any questions regarding medications administration/dosing? None reported. Are meds being taken as prescribed?  Yes. Patient should review meds with caller to confirm. Confirmed with pt. Have there been any falls? None reported. Has Home Health been to the house and/or have they contacted you? NO. If not, have you tried to contact them? No.Can we help you contact them?   Are bowels and bladder emptying properly? Yes. Are there any unexpected incontinence issues? None reported.  If applicable, is patient following bowel/bladder programs?  Any fevers, problems with breathing, unexpected pain? None reported. Are there any skin problems or new areas of breakdown? None reported. Has the patient/family member arranged specialty MD follow up (ie cardiology/neurology/renal/surgical/etc)?[facility]  will contact him Can we help arrange?  Does the patient need any other services or support that we can help arrange? N/A Are caregivers following through as expected in assisting the patient?  Yes.        11. Has the patient quit smoking, drinking alcohol, or using drugs as recommended? N/A   Appointment  [address]

## 2024-03-25 ENCOUNTER — Encounter: Admitting: Sports Medicine

## 2024-03-27 ENCOUNTER — Other Ambulatory Visit: Payer: Self-pay

## 2024-03-27 ENCOUNTER — Encounter: Payer: Self-pay | Admitting: Internal Medicine

## 2024-03-28 NOTE — Progress Notes (Signed)
 Subjective:    Patient ID: Christian [name], male    DOB: 1950-12-18, 73 y.o.   MRN: [id number]  HPI: Christian [name] is a 73 y.o. male who returns for Transitional Care visit for follow up of his Neurogenic Claudication due to lumbar spinal stenosis  , he elected to undergo  s/p L4-S-1  Laminectomy and fusion by Dr Faylene Hoots at [facility], Insulin  dependent Type 2 Diabetes Mellitus and Morbid Obesity.  Christian [name] was admitted to inpatient rehabilitation on 03/07/2024 and discharged home on 03/19/2024.   Christian [name] reports he hasn't recevied a call from [facility], this provider called and left message. New order was placed today and Mr. And Mrs Geter instructed to call this provider if they don't receive a call by Tuesday, the verbalizes understanding.   Christian [name] reports lower back pain . He rates his pain 5. He is using the Oxycodone  sparing and taking Tramadol . Oral swab was performed today.   Christian [name] Morphine equivalent is 20.00 MME.    He reports he has a good appetite, arrived via Scooter.   MR: Lumbar Spine: 12/23/2023 IMPRESSION: 1. Advanced and generalized lumbar spine degeneration with scoliosis and L5-S1 anterolisthesis. 2. At least moderate spinal stenosis at L2-3 to L5-S1. 3. Foraminal impingement on the left at L3-4 and below, particularly severe at L4-5. 4. Right foraminal impingement at L2-3 to L5-S1, particularly severe at L3-4 and L4-5. 5. Notable L4-5 left paracentral extrusion with upward migration and left subarticular recess impingement.      Pain Inventory Average Pain 5 Pain Right Now 5 My pain is intermittent and stabbing  In the last 24 hours, has pain interfered with the following? General activity 4 Relation with others 0 Enjoyment of life 0 What TIME of day is your pain at its worst? morning  Sleep (in general) Fair  Pain is worse with: walking and standing Pain improves with: rest and  medication Relief from Meds: 6  use a cane use a walker ability to climb steps?  yes do you drive?  no  retired  trouble walking  Any changes since last visit?  no  Any changes since last visit?  no    Family History  Problem Relation Age of Onset   Alcohol abuse Father    Arthritis Maternal Grandmother    Alcohol abuse Paternal Grandmother    Alcohol abuse Paternal Grandfather    Diabetes Maternal Aunt    Social History   Socioeconomic History   Marital status: Married    Spouse name: [name]   Number of children: 3   Years of education: 14   Highest education level: Some college, no degree  Occupational History   Occupation: Retired  Tobacco Use   Smoking status: Former    Current packs/day: 0.00    Average packs/day: 1 pack/day for 42.2 years (42.2 ttl pk-yrs)    Types: Cigarettes    Start date: 12/12/1961    Quit date: 03/08/2004    Years since quitting: 20.0   Smokeless tobacco: Never  Vaping Use   Vaping status: Never Used  Substance and Sexual Activity   Alcohol use: No    Comment: rare   Drug use: No   Sexual activity: Yes    Partners: Female  Other Topics Concern   Not on file  Social History Narrative   Lives with wife. He has three children. He enjoys Diplomatic Services operational officer.   Social Drivers of Dispensing optician  Resource Strain: Low Risk  (12/11/2023)   Overall Financial Resource Strain (CARDIA)    Difficulty of Paying Living Expenses: Not hard at all  Food Insecurity: No Food Insecurity (03/04/2024)   [facility]   Hunger Vital Sign    Worried About Running Out of Food in the Last Year: Never true    Ran Out of Food in the Last Year: Never true  Transportation Needs: No Transportation Needs (03/05/2024)   Received from [facility] - Transportation    Lack of Transportation (Medical): No    Lack of Transportation (Non-Medical): No  Physical Activity: Inactive (12/11/2023)   Exercise Vital Sign    Days of  Exercise per Week: 0 days    Minutes of Exercise per Session: 0 min  Stress: No Stress Concern Present (03/04/2024)   Received from [facility] - Occupational Stress Questionnaire    Feeling of Stress : Only a little  Social Connections: Moderately Isolated (12/11/2023)   Social Connection and Isolation Panel [NHANES]    Frequency of Communication with Friends and Family: Twice a week    Frequency of Social Gatherings with Friends and Family: Three times a week    Attends Religious Services: Never    Active Member of Clubs or Organizations: No    Attends Banker Meetings: Never    Marital Status: Married   Past Surgical History:  Procedure Laterality Date   broken toe 35 years ago     COLONOSCOPY WITH PROPOFOL  N/A 12/15/2022   Procedure: COLONOSCOPY WITH PROPOFOL ;  Surgeon: Luke Salaam, MD;  Location: [facility];  Service: Gastroenterology;  Laterality: N/A;   IR EMBO ARTERIAL NOT HEMORR HEMANG INC GUIDE ROADMAPPING  04/27/2023   IR RADIOLOGIST EVAL & MGMT  03/29/2023   IR RADIOLOGIST EVAL & MGMT  08/02/2023   TOTAL KNEE ARTHROPLASTY Right 05/01/2015   Procedure: RIGHT TOTAL KNEE ARTHROPLASTY;  Surgeon: Arnie Lao, MD;  Location: [facility];  Service: Orthopedics;  Laterality: Right;   WISDOM TOOTH EXTRACTION     Past Medical History:  Diagnosis Date   Arthritis of left knee    Chronic back pain 09/13/2013   Chronic pain syndrome 09/13/2013   Diabetic ulcer of right ankle (HCC)    GERD (gastroesophageal reflux disease)    hx. of   Gout 09/13/2013   Hemorrhoids    Hypertension    Morbid obesity with body mass index of 45.0-49.9 in adult [facility]) 09/13/2013   Osteoarthritis 09/13/2013   Pneumonia    hx. of   Seizures (HCC)    Sleep apnea    has never used C-pap machine due to insurance cost   Type 1 diabetes mellitus, uncontrolled    Ht 5\' 6"  (1.676 m)   BMI 48.46 kg/m   Opioid Risk Score:   Fall Risk Score:   `1  Depression screen [facility] 2/9     12/11/2023    2:51 PM 12/02/2022   10:51 AM 11/29/2021    9:41 AM 06/01/2021    2:51 PM 10/25/2019   10:46 AM 08/31/2017    2:04 PM 12/21/2015   10:28 AM  Depression screen PHQ 2/9  Decreased Interest 0 0 0 0 0 0 0  Down, Depressed, Hopeless 0 0 0 0 0 0 0  PHQ - 2 Score 0 0 0 0 0 0 0  Altered sleeping    0  0   Tired, decreased energy  0  0   Change in appetite    0  0   Feeling bad or failure about yourself     0  0   Trouble concentrating    0  0   Moving slowly or fidgety/restless    0  0   Suicidal thoughts    0  0   PHQ-9 Score    0  0   Difficult doing work/chores    Not difficult at all  Not difficult at all     Review of Systems  Musculoskeletal:  Positive for back pain and gait problem.       Has severe rotator cuff issues bilateral and has very little reach  All other systems reviewed and are negative.      Objective:   Physical Exam Vitals and nursing note reviewed.  Constitutional:      Appearance: Normal appearance.  Cardiovascular:     Rate and Rhythm: Tachycardia present.     Pulses: Normal pulses.     Heart sounds: Normal heart sounds.  Pulmonary:     Effort: Pulmonary effort is normal.     Breath sounds: Normal breath sounds.  Musculoskeletal:     Comments: Normal Muscle Bulk and Muscle Testing Reveals:  Upper Extremities: Decreased ROM 20 Degrees  and Muscle Strength  5/5 Lumbar Paraspinal Tenderness: L-4-L-5 Lower Extremities: Decreased ROM and Muscle Strength 5/5 Left Lower Extremity Flexion Produces Pain into her Left Lower Extremity Arrived via Scooter    Skin:    General: Skin is warm and dry.  Neurological:     Mental Status: He is alert and oriented to person, place, and time.  Psychiatric:        Mood and Affect: Mood normal.        Behavior: Behavior normal.         Assessment & Plan:  Neurogenic Claudication due to lumbar spinal stenosis: he elected to undergo  s/p L4-S-1  Laminectomy and  fusion by Dr Faylene Hoots at [facility]: Referral placed to [facility] for Physical Therapy. He has a scheduled appointment with Dr Faylene Hoots. Continue to Monitor.  2.  Insulin  dependent Type 2 Diabetes Mellitus : Continue current medication regimen. PCP following. Continue to monitor.  3. Tachycardia: Apical Pulse Checked. Mr. Oien has a scheduled appointment with Dr Sandy Crumb. He will keep a vital low sheet and report readings to his PCP. He verbalizes understanding.  4. Tachycardia: Apical Pulse checked: He will F/U with her PCP. Continue to Monitor.  5. Morbid Obesity. Continue Healthy Diet Regimen. Continue to Monitor.  6. Post-Operative Pain: Continue Oxycodone  as needed . RX: Tramadol  50 mg one tablet every 12 hours as needed for pain #60. We will continue the opioid monitoring program, this consists of regular clinic visits, examinations, urine drug screen, pill counts as well as use of [place]  Controlled Substance Reporting system. A 12 month History has been reviewed on the [place]  Controlled Substance Reporting System on 03/29/2024  F/U in 4- 6 weeks with Dr Lovorn

## 2024-03-29 ENCOUNTER — Encounter: Payer: Self-pay | Admitting: Registered Nurse

## 2024-03-29 ENCOUNTER — Encounter: Attending: Registered Nurse | Admitting: Registered Nurse

## 2024-03-29 VITALS — BP 126/89 | HR 108 | Ht 66.0 in | Wt 285.4 lb

## 2024-03-29 DIAGNOSIS — Z79891 Long term (current) use of opiate analgesic: Secondary | ICD-10-CM

## 2024-03-29 DIAGNOSIS — E119 Type 2 diabetes mellitus without complications: Secondary | ICD-10-CM

## 2024-03-29 DIAGNOSIS — Z9889 Other specified postprocedural states: Secondary | ICD-10-CM | POA: Diagnosis present

## 2024-03-29 DIAGNOSIS — M48062 Spinal stenosis, lumbar region with neurogenic claudication: Secondary | ICD-10-CM

## 2024-03-29 DIAGNOSIS — G8918 Other acute postprocedural pain: Secondary | ICD-10-CM | POA: Diagnosis present

## 2024-03-29 DIAGNOSIS — Z5181 Encounter for therapeutic drug level monitoring: Secondary | ICD-10-CM | POA: Diagnosis not present

## 2024-03-29 DIAGNOSIS — Z794 Long term (current) use of insulin: Secondary | ICD-10-CM | POA: Diagnosis present

## 2024-03-29 DIAGNOSIS — G894 Chronic pain syndrome: Secondary | ICD-10-CM | POA: Diagnosis not present

## 2024-03-29 DIAGNOSIS — R000 Tachycardia, unspecified: Secondary | ICD-10-CM | POA: Diagnosis present

## 2024-03-29 MED ORDER — TRAMADOL HCL 50 MG PO TABS: 50.0000 mg | ORAL_TABLET | Freq: Two times a day (BID) | ORAL | 0 refills | Status: DC | PRN

## 2024-04-02 DIAGNOSIS — M545 Low back pain, unspecified: Secondary | ICD-10-CM | POA: Diagnosis not present

## 2024-04-02 DIAGNOSIS — Z1330 Encounter for screening examination for mental health and behavioral disorders, unspecified: Secondary | ICD-10-CM | POA: Diagnosis not present

## 2024-04-02 LAB — DRUG TOX MONITOR 1 W/CONF, ORAL FLD
Amphetamines: NEGATIVE ng/mL (ref <10)
Barbiturates: NEGATIVE ng/mL (ref <10)
Benzodiazepines: NEGATIVE ng/mL (ref <0.50)
Buprenorphine: NEGATIVE ng/mL (ref <0.10)
Cocaine: NEGATIVE ng/mL (ref <5.0)
Codeine: NEGATIVE ng/mL (ref <2.5)
Dihydrocodeine: NEGATIVE ng/mL (ref <2.5)
Fentanyl: NEGATIVE ng/mL (ref <0.10)
Heroin Metabolite: NEGATIVE ng/mL (ref <1.0)
Hydrocodone: NEGATIVE ng/mL (ref <2.5)
Hydromorphone: NEGATIVE ng/mL (ref <2.5)
MARIJUANA: NEGATIVE ng/mL (ref <2.5)
MDMA: NEGATIVE ng/mL (ref <10)
Meprobamate: NEGATIVE ng/mL (ref <2.5)
Methadone: NEGATIVE ng/mL (ref <5.0)
Morphine: NEGATIVE ng/mL (ref <2.5)
Nicotine Metabolite: NEGATIVE ng/mL (ref <5.0)
Norhydrocodone: NEGATIVE ng/mL (ref <2.5)
Noroxycodone: 3.6 ng/mL — ABNORMAL HIGH (ref <2.5)
Opiates: POSITIVE ng/mL — AB (ref <2.5)
Oxycodone: NEGATIVE ng/mL (ref <2.5)
Oxymorphone: NEGATIVE ng/mL (ref <2.5)
Phencyclidine: NEGATIVE ng/mL (ref <10)
Tapentadol: NEGATIVE ng/mL (ref <5.0)
Tramadol: >500 ng/mL — ABNORMAL HIGH (ref <5.0)
Tramadol: POSITIVE ng/mL — AB (ref <5.0)
Zolpidem: NEGATIVE ng/mL (ref <5.0)

## 2024-04-02 LAB — DRUG TOX ALC METAB W/CON, ORAL FLD: Alcohol Metabolite: NEGATIVE ng/mL (ref <25)

## 2024-04-08 ENCOUNTER — Telehealth: Payer: Self-pay

## 2024-04-08 NOTE — Telephone Encounter (Signed)
 Copied from CRM [id number]. Topic: General - Other >> Apr 05, 2024 12:04 PM Shelby Dessert H wrote: Reason for CRM: Rep from [facility] wants to speak to the clinic (nurse or CMA) so he can inform what information is needed for the medical supplies. Alveria Johann can be reached back at [phone]

## 2024-04-08 NOTE — Telephone Encounter (Signed)
 Attempted call to [employer]- left a voice mail requesting a return call

## 2024-04-10 NOTE — Telephone Encounter (Signed)
 Spoke with [employer] -  they are suppliers of CGM -  states patient has gotten supplies from [facility] in the past but he has not seen this provider for awhile due to being in and out of the hospitals.  He has upcoming appt scheduled for physical with Dr. Sandy Crumb on 04/15/2024. Wanting to know if could have A1C drawn at this visit and document continued use of Continus glucose monitor and benefiting from use could be noted in chart as well?

## 2024-04-11 NOTE — Telephone Encounter (Signed)
 Yes that is fine, just ask him to remind me during the visit.

## 2024-04-11 NOTE — Telephone Encounter (Signed)
 Patient wife informed. Will remind Dr. Elva Hamburger of this at time of visit.

## 2024-04-15 ENCOUNTER — Encounter: Admitting: Sports Medicine

## 2024-04-16 ENCOUNTER — Telehealth: Payer: Self-pay | Admitting: Sports Medicine

## 2024-04-16 NOTE — Telephone Encounter (Signed)
 Copied from CRM [id number]. Topic: General - Other >> Apr 16, 2024  1:07 PM Kevelyn M wrote: Reason for CRM: Christian [name] with [employer] [facility]. He would like a nurse to call him back to confirm the rescheduled appointment time and date for the patient's last appointment. Burdette Carolin) call back # [phone]

## 2024-04-17 NOTE — Telephone Encounter (Signed)
 Christian [name] informed of new appt date and time

## 2024-05-03 ENCOUNTER — Other Ambulatory Visit (INDEPENDENT_AMBULATORY_CARE_PROVIDER_SITE_OTHER)

## 2024-05-03 ENCOUNTER — Ambulatory Visit: Admitting: Sports Medicine

## 2024-05-03 ENCOUNTER — Encounter: Payer: Self-pay | Admitting: Sports Medicine

## 2024-05-03 VITALS — BP 120/80 | HR 116 | Ht 66.0 in | Wt 279.0 lb

## 2024-05-03 DIAGNOSIS — L918 Other hypertrophic disorders of the skin: Secondary | ICD-10-CM | POA: Diagnosis not present

## 2024-05-03 DIAGNOSIS — E538 Deficiency of other specified B group vitamins: Secondary | ICD-10-CM | POA: Diagnosis not present

## 2024-05-03 DIAGNOSIS — M1712 Unilateral primary osteoarthritis, left knee: Secondary | ICD-10-CM | POA: Diagnosis not present

## 2024-05-03 DIAGNOSIS — Z794 Long term (current) use of insulin: Secondary | ICD-10-CM | POA: Diagnosis not present

## 2024-05-03 DIAGNOSIS — Z0000 Encounter for general adult medical examination without abnormal findings: Secondary | ICD-10-CM | POA: Diagnosis not present

## 2024-05-03 DIAGNOSIS — E119 Type 2 diabetes mellitus without complications: Secondary | ICD-10-CM

## 2024-05-03 DIAGNOSIS — E559 Vitamin D deficiency, unspecified: Secondary | ICD-10-CM | POA: Diagnosis not present

## 2024-05-03 MED ORDER — SHINGRIX 50 MCG/0.5ML IM SUSR
0.5000 mL | Freq: Once | INTRAMUSCULAR | 0 refills | Status: AC
Start: 2024-05-03 — End: 2024-05-03

## 2024-05-03 MED ORDER — TRIAMCINOLONE ACETONIDE 40 MG/ML IJ SUSP
40.0000 mg | Freq: Once | INTRAMUSCULAR | Status: AC
Start: 2024-05-03 — End: 2024-05-03
  Administered 2024-05-03: 40 mg via INTRA_ARTICULAR

## 2024-05-03 MED ORDER — TETANUS-DIPHTH-ACELL PERTUSSIS 5-2.5-18.5 LF-MCG/0.5 IM SUSY: 0.5000 mL | PREFILLED_SYRINGE | Freq: Once | INTRAMUSCULAR | 0 refills | Status: AC

## 2024-05-03 NOTE — Telephone Encounter (Unsigned)
 Copied from CRM [id number]. Topic: General - Other >> May 03, 2024  3:37 PM Kevelyn M wrote: Reason for CRM: [place] with [facility] called to verify the patient's next appointment. They want to make sure the usage of his CGM Libre 3 is documented. Call back # [phone]

## 2024-05-03 NOTE — Assessment & Plan Note (Signed)
 Annual physical as above. Due for Shingrix , Tdap, Prevnar 20, he declines Prevnar, we will send Tdap and Shingrix  to his pharmacy.

## 2024-05-03 NOTE — Assessment & Plan Note (Signed)
 Surgical excision #2 pedunculated skin tags right flank.

## 2024-05-03 NOTE — Assessment & Plan Note (Signed)
 Doing well status post back surgery. 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 Back pain is gone, he still has some left leg pain at the medial joint line and distal approximately 3 to 4 inches. There may be an amount of pes anserine bursitis although I think his principal problem is pain from the joint, we injected his knee joint today, return to see me 6 weeks for this.

## 2024-05-03 NOTE — Progress Notes (Signed)
 Subjective:    CC: Annual Physical Exam  HPI:  This patient is here for their annual physical  I reviewed the past medical history, family history, social history, surgical history, and allergies today and no changes were needed.  Please see the problem list section below in epic for further details.  Past Medical History: Past Medical History:  Diagnosis Date   Arthritis of left knee    Chronic back pain 09/13/2013   Chronic pain syndrome 09/13/2013   Diabetic ulcer of right ankle (HCC)    GERD (gastroesophageal reflux disease)    hx. of   Gout 09/13/2013   Hemorrhoids    Hypertension    Morbid obesity with body mass index of 45.0-49.9 in adult [facility]) 09/13/2013   Osteoarthritis 09/13/2013   Pneumonia    hx. of   Seizures (HCC)    Sleep apnea    has never used C-pap machine due to insurance cost   Type 1 diabetes mellitus, uncontrolled    Past Surgical History: Past Surgical History:  Procedure Laterality Date   broken toe 35 years ago     COLONOSCOPY WITH PROPOFOL  N/A 12/15/2022   Procedure: COLONOSCOPY WITH PROPOFOL ;  Surgeon: Therisa Bi, MD;  Location[facility] ENDOSCOPY;  Service: Gastroenterology;  Laterality: N/A;   IR EMBO ARTERIAL NOT HEMORR HEMANG INC GUIDE ROADMAPPING  04/27/2023   IR RADIOLOGIST EVAL & MGMT  03/29/2023   IR RADIOLOGIST EVAL & MGMT  08/02/2023   TOTAL KNEE ARTHROPLASTY Right 05/01/2015   Procedure: RIGHT TOTAL KNEE ARTHROPLASTY;  Surgeon: Lonni CINDERELLA Poli, MD;  Location: [facility] ORS;  Service: Orthopedics;  Laterality: Right;   WISDOM TOOTH EXTRACTION     Social History: Social History   Socioeconomic History   Marital status: Married    Spouse name: [name]   Number of children: 3   Years of education: 14   Highest education level: Some college, no degree  Occupational History   Occupation: Retired  Tobacco Use   Smoking status: Former    Current packs/day: 0.00    Average packs/day: 1 pack/day for 42.2 years (42.2 ttl pk-yrs)    Types:  Cigarettes    Start date: 12/12/1961    Quit date: 03/08/2004    Years since quitting: 20.1   Smokeless tobacco: Never  Vaping Use   Vaping status: Never Used  Substance and Sexual Activity   Alcohol use: No    Comment: rare   Drug use: No   Sexual activity: Yes    Partners: Female  Other Topics Concern   Not on file  Social History Narrative   Lives with wife. He has three children. He enjoys Diplomatic Services operational officer.   Social Drivers of Corporate investment banker Strain: Low Risk  (04/02/2024)   Received from [facility]   Overall Financial Resource Strain (CARDIA)    Difficulty of Paying Living Expenses: Not hard at all  Food Insecurity: No Food Insecurity (04/02/2024)   Received from [facility]   Hunger Vital Sign    Within the past 12 months, you worried that your food would run out before you got the money to buy more.: Never true    Within the past 12 months, the food you bought just didn't last and you didn't have money to get more.: Never true  Transportation Needs: No Transportation Needs (04/02/2024)   Received from [facility] - Transportation    Lack of Transportation (Medical): No    Lack of Transportation (  Non-Medical): No  Physical Activity: Inactive (12/11/2023)   Exercise Vital Sign    Days of Exercise per Week: 0 days    Minutes of Exercise per Session: 0 min  Stress: No Stress Concern Present (03/04/2024)   Received from [facility] - Occupational Stress Questionnaire    Feeling of Stress : Only a little  Social Connections: Moderately Isolated (12/11/2023)   Social Connection and Isolation Panel    Frequency of Communication with Friends and Family: Twice a week    Frequency of Social Gatherings with Friends and Family: Three times a week    Attends Religious Services: Never    Active Member of Clubs or Organizations: No    Attends Banker Meetings: Never    Marital Status:  Married   Family History: Family History  Problem Relation Age of Onset   Alcohol abuse Father    Arthritis Maternal Grandmother    Alcohol abuse Paternal Grandmother    Alcohol abuse Paternal Grandfather    Diabetes Maternal Aunt    Allergies: Allergies  Allergen Reactions   Jardiance  [Empagliflozin ]     Yeast infections   Metoprolol  Other (See Comments)    Caused blood sugars to be elevated and not controlled   Penicillins Rash   Medications: See med rec.  Review of Systems: No headache, visual changes, nausea, vomiting, diarrhea, constipation, dizziness, abdominal pain, skin rash, fevers, chills, night sweats, swollen lymph nodes, weight loss, chest pain, body aches, joint swelling, muscle aches, shortness of breath, mood changes, visual or auditory hallucinations.  Objective:    General: Well Developed, well nourished, and in no acute distress.  Neuro: Alert and oriented x3, extra-ocular muscles intact, sensation grossly intact. Cranial nerves II through XII are intact, motor, sensory, and coordinative functions are all intact. HEENT: Normocephalic, atraumatic, pupils equal round reactive to light, neck supple, no masses, no lymphadenopathy, thyroid  nonpalpable. Oropharynx, nasopharynx, external ear canals are unremarkable. Skin: Warm and dry, no rashes noted. #2 pedunculated skin tags right flank Cardiac: Regular rate and rhythm, no murmurs rubs or gallops.  Respiratory: Clear to auscultation bilaterally. Not using accessory muscles, speaking in full sentences.  Abdominal: Soft, nontender, nondistended, positive bowel sounds, no masses, no organomegaly.  Musculoskeletal: Shoulder, elbow, wrist, hip, knee, ankle stable, and with full range of motion.  Procedure:  Excision of  #2 pedunculated skin tags right flank Risks, benefits, and alternatives explained and consent obtained. Time out conducted. Surface prepped with chlorhexidine  1cc lidocaine  with epinephine infiltrated  in a field block under each skin tag, the stalk was prepped with a hemostat, cut, and then hyfrecated. Hemostasis achieved. Pt stable.  Procedure: Real-time Ultrasound Guided injection of the left knee Device: Samsung HS60  Verbal informed consent obtained.  Time-out conducted.  Noted no overlying erythema, induration, or other signs of local infection.  Skin prepped in a sterile fashion.  Local anesthesia: Topical Ethyl chloride.  With sterile technique and under real time ultrasound guidance: Mild effusion noted, 1 cc Kenalog  40, 2 cc lidocaine , 2 cc bupivacaine  injected easily Completed without difficulty  Advised to call if fevers/chills, erythema, induration, drainage, or persistent bleeding.  Images permanently stored and available for review in PACS.  Impression: Technically successful ultrasound guided injection.  Impression and Recommendations:    The patient was counselled, risk factors were discussed, anticipatory guidance given.  Skin tag Surgical excision #2 pedunculated skin tags right flank.  Annual physical exam Annual physical as above. Due for  Shingrix , Tdap, Prevnar 20, he declines Prevnar, we will send Tdap and Shingrix  to his pharmacy.  Primary osteoarthritis of left knee Doing well status post back surgery. 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 Back pain is gone, he still has some left leg pain at the medial joint line and distal approximately 3 to 4 inches. There may be an amount of pes anserine bursitis although I think his principal problem is pain from the joint, we injected his knee joint today, return to see me 6 weeks for this.  Insulin  dependent type 2 diabetes mellitus (HCC) IDDM2, currently using continuous glucose monitor for labile blood sugars.   ____________________________________________ Debby PARAS. Curtis, M.D., ABFM., CAQSM., AME. Primary Care and Sports Medicine [facility]  Adjunct Professor of  [facility] Medicine  [employer]  Restaurant manager, fast food

## 2024-05-03 NOTE — Addendum Note (Signed)
 Addended by: OLEY CHIQUITA CROME on: 05/03/2024 03:04 PM   Modules accepted: Orders

## 2024-05-04 LAB — COMPREHENSIVE METABOLIC PANEL WITH GFR
ALT: 26 IU/L (ref 0–44)
AST: 24 IU/L (ref 0–40)
Albumin: 4.2 g/dL (ref 3.8–4.8)
Alkaline Phosphatase: 138 IU/L — ABNORMAL HIGH (ref 44–121)
BUN/Creatinine Ratio: 15 (ref 10–24)
BUN: 15 mg/dL (ref 8–27)
Bilirubin Total: 0.4 mg/dL (ref 0.0–1.2)
CO2: 18 mmol/L — ABNORMAL LOW (ref 20–29)
Calcium: 9.7 mg/dL (ref 8.6–10.2)
Chloride: 104 mmol/L (ref 96–106)
Creatinine, Ser: 0.97 mg/dL (ref 0.76–1.27)
Globulin, Total: 2.9 g/dL (ref 1.5–4.5)
Glucose: 99 mg/dL (ref 70–99)
Potassium: 4.4 mmol/L (ref 3.5–5.2)
Sodium: 139 mmol/L (ref 134–144)
Total Protein: 7.1 g/dL (ref 6.0–8.5)
eGFR: 82 mL/min/{1.73_m2} (ref >59)

## 2024-05-04 LAB — CBC
Hematocrit: 44.1 % (ref 37.5–51.0)
Hemoglobin: 14.4 g/dL (ref 13.0–17.7)
MCH: 32.2 pg (ref 26.6–33.0)
MCHC: 32.7 g/dL (ref 31.5–35.7)
MCV: 99 fL — ABNORMAL HIGH (ref 79–97)
Platelets: 331 10*3/uL (ref 150–450)
RBC: 4.47 x10E6/uL (ref 4.14–5.80)
RDW: 12.5 % (ref 11.6–15.4)
WBC: 12.2 10*3/uL — ABNORMAL HIGH (ref 3.4–10.8)

## 2024-05-04 LAB — LIPID PANEL
Chol/HDL Ratio: 3.8 ratio (ref 0.0–5.0)
Cholesterol, Total: 162 mg/dL (ref 100–199)
HDL: 43 mg/dL (ref >39)
LDL Chol Calc (NIH): 95 mg/dL (ref 0–99)
Triglycerides: 138 mg/dL (ref 0–149)
VLDL Cholesterol Cal: 24 mg/dL (ref 5–40)

## 2024-05-04 LAB — VITAMIN D 25 HYDROXY (VIT D DEFICIENCY, FRACTURES): Vit D, 25-Hydroxy: 33.3 ng/mL (ref 30.0–100.0)

## 2024-05-04 LAB — TSH: TSH: 1.62 u[IU]/mL (ref 0.450–4.500)

## 2024-05-04 LAB — HEMOGLOBIN A1C
Est. average glucose Bld gHb Est-mCnc: 126 mg/dL
Hgb A1c MFr Bld: 6 % — ABNORMAL HIGH (ref 4.8–5.6)

## 2024-05-04 LAB — VITAMIN B12: Vitamin B-12: 473 pg/mL (ref 232–1245)

## 2024-05-05 ENCOUNTER — Ambulatory Visit: Payer: Self-pay | Admitting: Sports Medicine

## 2024-05-06 NOTE — Telephone Encounter (Signed)
 [facility] is wanting to make sure we document the patient's use of the CGM. We saw patient last week but his note does not note usage.

## 2024-05-06 NOTE — Telephone Encounter (Signed)
 I have updated the note for the day.

## 2024-05-06 NOTE — Assessment & Plan Note (Signed)
 IDDM2, currently using continuous glucose monitor for labile blood sugars.

## 2024-05-23 ENCOUNTER — Other Ambulatory Visit: Payer: Self-pay | Admitting: Internal Medicine

## 2024-05-30 DIAGNOSIS — M47896 Other spondylosis, lumbar region: Secondary | ICD-10-CM | POA: Diagnosis not present

## 2024-05-30 DIAGNOSIS — I10 Essential (primary) hypertension: Secondary | ICD-10-CM | POA: Diagnosis not present

## 2024-05-30 DIAGNOSIS — M4326 Fusion of spine, lumbar region: Secondary | ICD-10-CM | POA: Diagnosis not present

## 2024-06-06 ENCOUNTER — Ambulatory Visit (INDEPENDENT_AMBULATORY_CARE_PROVIDER_SITE_OTHER): Admitting: Orthopedic Surgery

## 2024-06-06 DIAGNOSIS — M25511 Pain in right shoulder: Secondary | ICD-10-CM | POA: Diagnosis not present

## 2024-06-07 ENCOUNTER — Encounter: Payer: Self-pay | Admitting: Orthopedic Surgery

## 2024-06-07 NOTE — Progress Notes (Signed)
 Office Visit Note   Patient: Christian [name]           Date of Birth: 07/16/1951           MRN: [id number] Visit Date: 06/06/2024 Requested by: Curtis Debby PARAS, MD [address] [place],  [place] [address] PCP: Curtis Debby PARAS, MD  Subjective: Chief Complaint  Patient presents with   Right Shoulder - Pain    HPI: Christian [name] is a 73 y.o. male who presents to the office reporting right shoulder pain.  He was on the schedule last year but he actually fell and required an extensive back surgery.  He has done well with that back surgery.  Had a fusion done.  Now he reports continued right shoulder pain with very significant limitation of functional activity.  Really hard for him to eat.  Does have a history of insulin -dependent diabetes with hemoglobin A1c 6.0.  Using a walker and a cane.  He states his right arm is fairly useless.  Prior CT scan last year does show rotator cuff arthropathy as well as significant rotator cuff atrophy and glenohumeral arthritis              ROS: All systems reviewed are negative as they relate to the chief complaint within the history of present illness.  Patient denies fevers or chills.  Assessment & Plan: Visit Diagnoses:  1. Right shoulder pain, unspecified chronicity     Plan: Impression is right shoulder rotator cuff arthropathy.  Doing reasonably well from his back surgery.  Uses a walker to get up and around.  Currently has forward flexion abduction both well below 70 degrees.  Plan at this time is reverse shoulder replacement.  Think that CT scan pending for patient specific instrumentation and guidance for optimal implant position to increase longevity and function.  The risk and benefits of reverse shoulder replacement are discussed with the patient including but not limited to infection nerve or vessel damage incomplete pain relief as well as incomplete return of function.  Rehab process discussed.  Plan for surgery  within the next 4 to 6 weeks.  All questions answered.  Follow-Up Instructions: No follow-ups on file.   Orders:  Orders Placed This Encounter  Procedures   CT SHOULDER RIGHT WO CONTRAST   No orders of the defined types were placed in this encounter.     Procedures: No procedures performed   Clinical Data: No additional findings.  Objective: Vital Signs: There were no vitals taken for this visit.  Physical Exam:  Constitutional: Patient appears well-developed HEENT:  Head: Normocephalic Eyes:EOM are normal Neck: Normal range of motion Cardiovascular: Normal rate Pulmonary/chest: Effort normal Neurologic: Patient is alert Skin: Skin is warm Psychiatric: Patient has normal mood and affect  Ortho Exam: Ortho exam demonstrates range of motion of 60/95/150 passively but active forward flexion and abduction both below 70 degrees.  Deltoid does fire.  Motor or sensory function to the hand is intact.  Radial pulses intact.  Specialty Comments:  No specialty comments available.  Imaging: No results found.   PMFS History: Patient Active Problem List   Diagnosis Date Noted   Skin tag 05/03/2024   Leucocytosis 03/19/2024   Neurogenic claudication due to lumbar spinal stenosis 03/07/2024   Preop examination 02/26/2024   Tachycardia 02/26/2024   Venous stasis ulcer, left lower extremity with lymphedema 02/07/2023   Insulin  dependent type 2 diabetes mellitus (HCC) 01/02/2023   Impingement syndrome, shoulder, right 10/15/2021  Olecranon bursitis, left elbow 09/08/2021   Annual physical exam 06/01/2021   Polyneuropathy associated with underlying disease (HCC) 05/08/2019   Chronic left shoulder pain 01/02/2019   Hyperlipidemia associated with type 2 diabetes mellitus (HCC) 09/03/2017   Uncontrolled type 2 diabetes mellitus with peripheral neuropathy 01/27/2016   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 of   Gout 09/13/2013   Hemorrhoids    Hypertension    Morbid obesity with body mass index of 45.0-49.9 in adult [facility]) 09/13/2013   Osteoarthritis 09/13/2013   Pneumonia    hx. of   Seizures (HCC)    Sleep apnea    has never used C-pap machine due to insurance cost   Type 1 diabetes mellitus, uncontrolled     Family History  Problem Relation Age of Onset   Alcohol abuse Father    Arthritis Maternal Grandmother    Alcohol abuse Paternal Grandmother    Alcohol abuse Paternal Grandfather    Diabetes Maternal Aunt     Past Surgical History:  Procedure Laterality Date   broken toe 35 years ago     COLONOSCOPY WITH PROPOFOL  N/A 12/15/2022   Procedure: COLONOSCOPY WITH PROPOFOL ;  Surgeon: Therisa Bi, MD;  Location: [facility] ENDOSCOPY;  Service: Gastroenterology;  Laterality: N/A;   IR EMBO ARTERIAL NOT HEMORR HEMANG INC GUIDE ROADMAPPING  04/27/2023   IR RADIOLOGIST EVAL & MGMT  03/29/2023   IR RADIOLOGIST EVAL & MGMT  08/02/2023   TOTAL KNEE ARTHROPLASTY Right 05/01/2015   Procedure: RIGHT TOTAL KNEE ARTHROPLASTY;  Surgeon: Lonni CINDERELLA Poli, MD;  Location: [facility] ORS;  Service: Orthopedics;  Laterality: Right;   WISDOM TOOTH EXTRACTION     Social History   Occupational History   Occupation: Retired  Tobacco Use   Smoking status: Former    Current packs/day: 0.00    Average packs/day: 1 pack/day for 42.2 years (42.2 ttl pk-yrs)    Types: Cigarettes    Start date: 12/12/1961    Quit date: 03/08/2004    Years since quitting: 20.2   Smokeless tobacco: Never  Vaping Use   Vaping status: Never Used  Substance and Sexual Activity   Alcohol use: No     Comment: rare   Drug use: No   Sexual activity: Yes    Partners: Female

## 2024-06-10 ENCOUNTER — Ambulatory Visit
Admission: RE | Admit: 2024-06-10 | Discharge: 2024-06-10 | Disposition: A | Discharge status: Home / Self Care | Source: Ambulatory Visit | Attending: Orthopedic Surgery | Admitting: Orthopedic Surgery

## 2024-06-10 DIAGNOSIS — M25511 Pain in right shoulder: Secondary | ICD-10-CM

## 2024-06-10 DIAGNOSIS — M19011 Primary osteoarthritis, right shoulder: Secondary | ICD-10-CM | POA: Diagnosis not present

## 2024-06-10 DIAGNOSIS — M25411 Effusion, right shoulder: Secondary | ICD-10-CM | POA: Diagnosis not present

## 2024-06-12 ENCOUNTER — Ambulatory Visit: Admitting: Internal Medicine

## 2024-06-14 ENCOUNTER — Ambulatory Visit (INDEPENDENT_AMBULATORY_CARE_PROVIDER_SITE_OTHER): Admitting: Sports Medicine

## 2024-06-14 ENCOUNTER — Ambulatory Visit

## 2024-06-14 DIAGNOSIS — E119 Type 2 diabetes mellitus without complications: Secondary | ICD-10-CM

## 2024-06-14 DIAGNOSIS — R634 Abnormal weight loss: Secondary | ICD-10-CM | POA: Diagnosis not present

## 2024-06-14 DIAGNOSIS — M1712 Unilateral primary osteoarthritis, left knee: Secondary | ICD-10-CM | POA: Diagnosis not present

## 2024-06-14 DIAGNOSIS — N139 Obstructive and reflux uropathy, unspecified: Secondary | ICD-10-CM

## 2024-06-14 DIAGNOSIS — R809 Proteinuria, unspecified: Secondary | ICD-10-CM | POA: Diagnosis not present

## 2024-06-14 DIAGNOSIS — Z794 Long term (current) use of insulin: Secondary | ICD-10-CM

## 2024-06-14 DIAGNOSIS — I700 Atherosclerosis of aorta: Secondary | ICD-10-CM | POA: Diagnosis not present

## 2024-06-14 DIAGNOSIS — I771 Stricture of artery: Secondary | ICD-10-CM | POA: Diagnosis not present

## 2024-06-14 DIAGNOSIS — E1129 Type 2 diabetes mellitus with other diabetic kidney complication: Secondary | ICD-10-CM

## 2024-06-14 LAB — POCT UA - MICROALBUMIN
Creatinine, POC: 50 mg/dL
Microalbumin Ur, POC: 10 mg/L

## 2024-06-14 MED ORDER — VALSARTAN 40 MG PO TABS: 40.0000 mg | ORAL_TABLET | Freq: Every day | ORAL | 3 refills | Status: DC

## 2024-06-14 NOTE — Assessment & Plan Note (Signed)
 End-stage left knee osteoarthritis, failed injection, follow-up with surgeon.

## 2024-06-14 NOTE — Assessment & Plan Note (Signed)
 Microalbuminuria noted today, adding low-dose valsartan . Unable to tolerate SGLT2s due to genital infections.

## 2024-06-14 NOTE — Assessment & Plan Note (Signed)
 Has done a great job losing weight. He is a bit concerned about cancer as a cause of the weight loss though the majority of his weight loss was intentional. He recently had a colonoscopy that was normal, we will going get a chest x-ray as he is a former smoker but does not quite meet criteria for lung cancer screening CT. We will also going get a PSA.

## 2024-06-14 NOTE — Progress Notes (Signed)
    Procedures performed today:    None.  Independent interpretation of notes and tests performed by another provider:   None.  Brief History, Exam, Impression, and Recommendations:    Microalbuminuria due to type 2 diabetes mellitus (HCC) Microalbuminuria noted today, adding low-dose valsartan . Unable to tolerate SGLT2s due to genital infections.   Morbid obesity (HCC) Has done a great job losing weight. He is a bit concerned about cancer as a cause of the weight loss though the majority of his weight loss was intentional. He recently had a colonoscopy that was normal, we will going get a chest x-ray as he is a former smoker but does not quite meet criteria for lung cancer screening CT. We will also going get a PSA.  Primary osteoarthritis of left knee End-stage left knee osteoarthritis, failed injection, follow-up with surgeon.  I spent 40 minutes of total time managing this patient today, this includes chart review, face to face, and non-face to face time.  ____________________________________________ Debby PARAS. Curtis, M.D., ABFM., CAQSM., AME. Primary Care and Sports Medicine [facility]  Adjunct Professor of [facility] Medicine  [employer]  Restaurant manager, fast food

## 2024-06-15 LAB — PSA, TOTAL AND FREE
PSA, Free Pct: 32.1 %
PSA, Free: 0.45 ng/mL
Prostate Specific Ag, Serum: 1.4 ng/mL (ref 0.0–4.0)

## 2024-06-16 ENCOUNTER — Ambulatory Visit: Payer: Self-pay | Admitting: Sports Medicine

## 2024-06-18 ENCOUNTER — Emergency Department

## 2024-06-18 ENCOUNTER — Emergency Department
Admission: EM | Admit: 2024-06-18 | Discharge: 2024-06-18 | Disposition: A | Discharge status: Home / Self Care | Attending: Emergency Medicine | Admitting: Emergency Medicine

## 2024-06-18 ENCOUNTER — Telehealth: Payer: Self-pay

## 2024-06-18 ENCOUNTER — Other Ambulatory Visit: Payer: Self-pay

## 2024-06-18 ENCOUNTER — Telehealth: Payer: Self-pay | Admitting: Surgical

## 2024-06-18 DIAGNOSIS — M19011 Primary osteoarthritis, right shoulder: Secondary | ICD-10-CM | POA: Diagnosis not present

## 2024-06-18 DIAGNOSIS — M25511 Pain in right shoulder: Secondary | ICD-10-CM | POA: Diagnosis not present

## 2024-06-18 DIAGNOSIS — M75101 Unspecified rotator cuff tear or rupture of right shoulder, not specified as traumatic: Secondary | ICD-10-CM | POA: Diagnosis not present

## 2024-06-18 DIAGNOSIS — G8929 Other chronic pain: Secondary | ICD-10-CM | POA: Insufficient documentation

## 2024-06-18 DIAGNOSIS — S43001A Unspecified subluxation of right shoulder joint, initial encounter: Secondary | ICD-10-CM | POA: Diagnosis not present

## 2024-06-18 DIAGNOSIS — S43014A Anterior dislocation of right humerus, initial encounter: Secondary | ICD-10-CM | POA: Diagnosis not present

## 2024-06-18 DIAGNOSIS — I700 Atherosclerosis of aorta: Secondary | ICD-10-CM | POA: Diagnosis not present

## 2024-06-18 DIAGNOSIS — M2580 Other specified joint disorders, unspecified joint: Secondary | ICD-10-CM | POA: Diagnosis not present

## 2024-06-18 MED ORDER — OXYCODONE HCL 5 MG PO TABS: 5.0000 mg | ORAL_TABLET | Freq: Three times a day (TID) | ORAL | 0 refills | Status: DC | PRN

## 2024-06-18 MED ORDER — OXYCODONE HCL 5 MG PO TABS
5.0000 mg | ORAL_TABLET | Freq: Once | ORAL | Status: AC
  Administered 2024-06-18 (×2): 5 mg via ORAL
  Filled 2024-06-18: qty 1

## 2024-06-18 NOTE — Telephone Encounter (Signed)
-----   Message from Barnie SAILOR sent at 06/18/2024  8:59 AM EDT ----- Regarding: cardiology referral needed urgently Patient is scheduled for right reverse shoulder arthroplasty with Dr Addie on 07-09-24. Herlene said the surgery needed to be scheduled ASAP. Patient's shoulder is dislocated.   This patient has a PCP- Dr. Curtis (family and sports medicine)  and is also under the care of Endocrinologist Dr. Trixie who treats patient for his diabetes.  I have already sent both requests to these providers.  The surgery order indicates a cardiac clearance is needed, however the patient does not have a cardiologist.  Please check with Dr. Addie or Herlene to make sure this is necessary since this may cause a delay.  Can you please enter the referral urgently if the cardiac clearance is required?

## 2024-06-18 NOTE — Telephone Encounter (Signed)
 Needs cards shoulder in thx

## 2024-06-18 NOTE — ED Notes (Signed)
 See triage note  Presents with right shoulder pain   States he has had issues with rotator cuff in past   Was scheduled to have surgery on shoulder  but had back surgery instead   States he had a CT scan done  and was called by his doctor to come to ED b/c of dislocation

## 2024-06-18 NOTE — ED Provider Notes (Signed)
 [facility] Emergency Department Provider Note     Event Date/Time   First MD Initiated Contact with Patient 06/18/24 [phone]     (approximate)   History   Shoulder Pain   HPI  Christian [name] is a 73 y.o. male presents to the ED after being sent by his orthopedic surgeon for an abnormal CT scan of his right shoulder.  Patient reports he received a call last night stating that his shoulder was dislocated and needed to come to the ED for reduction.      Physical Exam   Triage Vital Signs: ED Triage Vitals  Encounter Vitals Group     BP 06/18/24 0934 101/84     Girls Systolic BP Percentile --      Girls Diastolic BP Percentile --      Boys Systolic BP Percentile --      Boys Diastolic BP Percentile --      Pulse Rate 06/18/24 0934 (!) 102     Resp 06/18/24 0934 20     Temp 06/18/24 0934 98 F (36.7 C)     Temp Source 06/18/24 0934 Oral     SpO2 06/18/24 0934 97 %     Weight 06/18/24 0937 250 lb (113.4 kg)     Height 06/18/24 0937 5' 8 (1.727 m)     Head Circumference --      Peak Flow --      Pain Score 06/18/24 0934 7     Pain Loc --      Pain Education --      Exclude from Growth Chart --     Most recent vital signs: Vitals:   06/18/24 0934 06/18/24 1337  BP: 101/84 110/80  Pulse: (!) 102 93  Resp: 20 18  Temp: 98 F (36.7 C) 98 F (36.7 C)  SpO2: 97% 97%    General Awake, no distress.  HEENT NCAT.  CV:  Good peripheral perfusion.  RESP:  Normal effort.  ABD:  No distention.  Other:  Right shoulder reveals no obvious visible deformities.  Neurovascular status intact all throughout.  Full range of motion at elbow joint.  Limited active range of motion at shoulder joint with better passive range of motion.  No redness or warmth over joint.   ED Results / Procedures / Treatments   Labs (all labs ordered are listed, but only abnormal results are displayed) Labs Reviewed - No data to display  RADIOLOGY  I personally  viewed and evaluated these images as part of my medical decision making, as well as reviewing the written report by the radiologist.  ED Provider Interpretation: Right shoulder x-ray reveals mild inferior subluxation of the humeral head.  CT of the right shoulder reveals interval reduction of previously demonstrated anterior shoulder dislocation.  CT Shoulder Right Wo Contrast Result Date: 06/18/2024 CLINICAL DATA:  Shoulder pain, chronic, instability suspected, xray done Shoulder pain, chronic, rotator cuff disorder suspected, xray done Recent anterior shoulder dislocation. EXAM: CT OF THE UPPER RIGHT EXTREMITY WITHOUT CONTRAST TECHNIQUE: Multidetector CT imaging of the right shoulder was performed according to the standard protocol. RADIATION DOSE REDUCTION: This exam was performed according to the departmental dose-optimization program which includes automated exposure control, adjustment of the mA and/or kV according to patient size and/or use of iterative reconstruction technique. COMPARISON:  Radiographs 06/18/2024. CT of the right shoulder 06/10/2024. FINDINGS: Bones/Joint/Cartilage Interval reduction of the previously demonstrated anterior shoulder dislocation. The humeral head is now located. There is  no evidence of acute fracture. A large complex glenohumeral joint effusion is again noted, similar to previous CT. There is debris and scattered calcifications within the joint. Underlying acromioclavicular degenerative changes are present with associated chronic osseous fragmentation. Compared with the recent prior CT, there is increased widening of the acromioclavicular joint with a sharp anterior margin of the acromion, suggesting interval acromioplasty. In the absence of surgery, this could be due to osteolysis or AC joint injury. The scapula otherwise appears unremarkable. Ligaments Suboptimally assessed by CT. Muscles and Tendons Severe chronic fatty atrophy of the supraspinatus, infraspinatus and  subscapularis muscles again noted. The humeral head is high-riding with obliteration of the subacromial space, likely due to a chronic rotator cuff tear. Soft tissues As above, persistent large complex shoulder joint effusion. Fluid extends into the subcoracoid bursa. No other periarticular fluid collections or foreign bodies are seen. Aortic atherosclerosis and mild emphysema noted. IMPRESSION: 1. Interval reduction of previously demonstrated anterior shoulder dislocation. No evidence of acute fracture. 2. Persistent large complex glenohumeral joint effusion with debris and scattered calcifications. 3. Increased widening of the acromioclavicular joint with a sharp anterior margin of the acromion, suggesting interval acromioplasty. In the absence of surgery, this could be due to osteolysis or AC joint injury. 4. Chronic rotator cuff tear with severe chronic fatty atrophy of the supraspinatus, infraspinatus and subscapularis muscles. 5. Aortic Atherosclerosis (ICD10-I70.0) and Emphysema (ICD10-J43.9). Electronically Signed   By: Elsie Perone M.D.   On: 06/18/2024 13:25   DG Shoulder Right Result Date: 06/18/2024 CLINICAL DATA:  Shoulder pain.  No known injury. EXAM: RIGHT SHOULDER - 2+ VIEW COMPARISON:  Shoulder CT 1 week ago 06/10/2024 FINDINGS: The anterior shoulder dislocation on recent CT is not definitively seen on the current exam. There is mild inferior subluxation of the humeral head which is likely due to underlying joint effusion. Stable acromioclavicular degenerative change. No evidence of fracture or erosion. IMPRESSION: 1. Mild inferior subluxation of the humeral head is likely due to underlying joint effusion. Anterior shoulder dislocation on recent CT is not seen. 2. Redemonstrated acromioclavicular degenerative change. Electronically Signed   By: Andrea Gasman M.D.   On: 06/18/2024 10:15    PROCEDURES:  Critical Care performed: No  Procedures   MEDICATIONS ORDERED IN  ED: Medications  oxyCODONE  (Oxy IR/ROXICODONE ) immediate release tablet 5 mg (5 mg Oral Given 06/18/24 1135)     IMPRESSION / MDM / ASSESSMENT AND PLAN / ED COURSE  I reviewed the triage vital signs and the nursing notes.                              Clinical Course as of 06/18/24 1617  Tue Jun 18, 2024  1514 CT of right shoulder performed on 8/04   IMPRESSION: 1. Anterior dislocation of the right shoulder is age indeterminate but presumably related to chronic shoulder instability. 2. Very large glenohumeral joint effusion which appears to contain debris. 3. Severe fatty atrophy of the supraspinatus, infraspinatus and subscapularis most consistent chronic rotator cuff tears. 4. Moderate acromioclavicular osteoarthritis.  [MH]  1518 DG Shoulder Right IMPRESSION: 1. Mild inferior subluxation of the humeral head is likely due to underlying joint effusion. Anterior shoulder dislocation on recent CT is not seen. 2. Redemonstrated acromioclavicular degenerative change.      [MH]    Clinical Course User Index [MH] Margrette Monte A, PA-C    73 y.o. male presents to the emergency department for evaluation and  treatment of Right shoulder injury. See HPI for further details.   Differential diagnosis includes, but is not limited to shoulder dislocation, fracture, effusion  Patient's presentation is most consistent with acute complicated illness / injury requiring diagnostic workup.  Patient is alert and oriented.  He is hemodynamically improved physical exam findings are as noted above.  Orthopedics consulted and recommended repeat CT scan. No indicatio for reduction as the previous dislocation has spontaneously resolved. Patient placed in sling and advised to remain in sling until he can follow up with Dr. Sedrick office in [place]. He agreed to this care plan.  Patient is in stable condition for discharge home and outpatient management.  Short course of pain medication sent to  pharmacy for breakthrough pain.   FINAL CLINICAL IMPRESSION(S) / ED DIAGNOSES   Final diagnoses:  Chronic right shoulder pain   Rx / DC Orders   ED Discharge Orders          Ordered    oxyCODONE  (ROXICODONE ) 5 MG immediate release tablet  Every 8 hours PRN        06/18/24 1438             Note:  This document was prepared using Dragon voice recognition software and may include unintentional dictation errors.    Margrette, Erinn Huskins A, PA-C 06/18/24 1621    Dicky Anes, MD 06/22/24 1606

## 2024-06-18 NOTE — Discharge Instructions (Signed)
 Please call Dr. Addie for further evaluation of right shoulder pain.  Remain in splint until his follow-up.

## 2024-06-18 NOTE — ED Triage Notes (Signed)
 Pt to ED with wife for R dislocated shoulder seen on CT scan on Monday (8 days ago). Pt supposed to have rotator cuff surgery and CT scan was to evaluate for that. Has been having shoulder pain for 2 years. Had back surgery on 6/13.   No visible deformity noted to R shoulder. No tenderness on palpation. Cannot lift R arm fully. Pt in NAD.

## 2024-06-18 NOTE — Telephone Encounter (Signed)
 Pls advise.

## 2024-06-19 ENCOUNTER — Other Ambulatory Visit: Payer: Self-pay

## 2024-06-19 ENCOUNTER — Ambulatory Visit: Payer: Self-pay | Admitting: Orthopedic Surgery

## 2024-06-19 ENCOUNTER — Telehealth: Payer: Self-pay | Admitting: Surgical

## 2024-06-19 DIAGNOSIS — M25511 Pain in right shoulder: Secondary | ICD-10-CM

## 2024-06-19 NOTE — Telephone Encounter (Signed)
 Patient called. He would like to know if he needs to come in to see [facility] or Dr. Addie?

## 2024-06-19 NOTE — Progress Notes (Signed)
 Shoulder back in place now

## 2024-06-20 ENCOUNTER — Encounter: Payer: Self-pay | Admitting: Orthopedic Surgery

## 2024-06-20 NOTE — Telephone Encounter (Signed)
 Yes next week to check shoulder with luke thx

## 2024-06-21 ENCOUNTER — Encounter: Admitting: Physical Medicine and Rehabilitation

## 2024-07-01 ENCOUNTER — Ambulatory Visit: Attending: Cardiology | Admitting: Cardiology

## 2024-07-01 ENCOUNTER — Encounter: Payer: Self-pay | Admitting: Cardiology

## 2024-07-01 VITALS — BP 109/71 | HR 106 | Ht 67.0 in | Wt 289.0 lb

## 2024-07-01 DIAGNOSIS — Z01810 Encounter for preprocedural cardiovascular examination: Secondary | ICD-10-CM | POA: Diagnosis not present

## 2024-07-01 DIAGNOSIS — I4510 Unspecified right bundle-branch block: Secondary | ICD-10-CM | POA: Insufficient documentation

## 2024-07-01 NOTE — Progress Notes (Signed)
 Cardiology Office Note:    Date:  07/01/2024   ID:  Christian [name], DOB 1951/11/03, MRN [id number]  PCP:  Curtis Debby PARAS, MD   [facility] Providers Cardiologist:  None     Referring MD: [name], MD   Chief Complaint  Patient presents with   Follow-up    New pt here for Pre Op  has been doing well with no complaints of chest pain, chest pressure or SOB, medciation reviewed verbally with patient    History of Present Illness:    Christian [name] is a 73 y.o. male with a hx of diabetes, morbid obesity, chronic back pain, presenting for preop evaluation.  Patient has chronic joint pains, arthritis of knees, shoulders.  Right shoulder surgery is being planned.  He denies chest pain, has chronic shortness of breath.  Denies any personal history of heart disease.  Walks with a motorized scooter due to knee arthritis.  Denies history of hypertension, on valsartan  due to proteinuria.  Past Medical History:  Diagnosis Date   Arthritis of left knee    Chronic back pain 09/13/2013   Chronic pain syndrome 09/13/2013   Diabetic ulcer of right ankle (HCC)    GERD (gastroesophageal reflux disease)    hx. of   Gout 09/13/2013   Hemorrhoids    Hypertension    Morbid obesity with body mass index of 45.0-49.9 in adult [facility]) 09/13/2013   Osteoarthritis 09/13/2013   Pneumonia    hx. of   Seizures (HCC)    Sleep apnea    has never used C-pap machine due to insurance cost   Type 1 diabetes mellitus, uncontrolled     Past Surgical History:  Procedure Laterality Date   broken toe 35 years ago     COLONOSCOPY WITH PROPOFOL  N/A 12/15/2022   Procedure: COLONOSCOPY WITH PROPOFOL ;  Surgeon: Therisa Bi, MD;  Location: [facility] ENDOSCOPY;  Service: Gastroenterology;  Laterality: N/A;   IR EMBO ARTERIAL NOT HEMORR HEMANG INC GUIDE ROADMAPPING  04/27/2023   IR RADIOLOGIST EVAL & MGMT  03/29/2023   IR RADIOLOGIST EVAL & MGMT  08/02/2023   TOTAL KNEE ARTHROPLASTY Right  05/01/2015   Procedure: RIGHT TOTAL KNEE ARTHROPLASTY;  Surgeon: Lonni CINDERELLA Poli, MD;  Location: [facility] ORS;  Service: Orthopedics;  Laterality: Right;   WISDOM TOOTH EXTRACTION      Current Medications: Current Meds  Medication Sig   acetaminophen  (TYLENOL ) 325 MG tablet Take 2 tablets (650 mg total) by mouth 4 (four) times daily -  with meals and at bedtime.   aspirin  81 MG tablet Take 81 mg by mouth daily.   BD PEN NEEDLE NANO 2ND GEN 32G X 4 MM MISC USE AS DIRECTED 5 TIMES A DAY WITH INSULIN  INJECTIONS   Continuous Glucose Sensor (FREESTYLE LIBRE 3 SENSOR) MISC by Does not apply route. Use to check glucose continuously change every 14 days   diclofenac  Sodium (VOLTAREN ) 1 % GEL Apply 2 g topically 4 (four) times daily. To your left knee ( and bilateral shoulder if needed)   insulin  regular human CONCENTRATED (HUMULIN  R U-500 KWIKPEN) 500 UNIT/ML KwikPen INJECT 200 UNITS UNDER SKIN A DAY AS ADVISED   metFORMIN  (GLUCOPHAGE ) 1000 MG tablet Take 2 tablets (2,000 mg total) by mouth daily with breakfast.   methocarbamol  (ROBAXIN ) 500 MG tablet Take 1 tablet (500 mg total) by mouth 4 (four) times daily as needed for muscle spasms.   Multiple Vitamin (MULTIVITAMIN WITH MINERALS) TABS tablet Take 1 tablet by  mouth daily with lunch.   oxyCODONE  (OXY IR/ROXICODONE ) 5 MG immediate release tablet Take 1-2 tablets (5-10 mg total) by mouth every 6 (six) hours as needed for severe pain (pain score 7-10) (5 mg for pain range 7-8 and 10 mg for pain > level 8).   oxyCODONE  (ROXICODONE ) 5 MG immediate release tablet Take 1 tablet (5 mg total) by mouth every 8 (eight) hours as needed.   traMADol  (ULTRAM ) 50 MG tablet Take 1 tablet (50 mg total) by mouth every 12 (twelve) hours as needed (moderate pain level 4-6).   valsartan  (DIOVAN ) 40 MG tablet Take 1 tablet (40 mg total) by mouth daily.     Allergies:   Jardiance  [empagliflozin ], Metoprolol , Statins, and Penicillins   Social History   Socioeconomic  History   Marital status: Married    Spouse name: [name]   Number of children: 3   Years of education: 14   Highest education level: Some college, no degree  Occupational History   Occupation: Retired  Tobacco Use   Smoking status: Former    Current packs/day: 0.00    Average packs/day: 1 pack/day for 42.2 years (42.2 ttl pk-yrs)    Types: Cigarettes    Start date: 12/12/1961    Quit date: 03/08/2004    Years since quitting: 20.3   Smokeless tobacco: Never  Vaping Use   Vaping status: Never Used  Substance and Sexual Activity   Alcohol use: No    Comment: rare   Drug use: No   Sexual activity: Yes    Partners: Female  Other Topics Concern   Not on file  Social History Narrative   Lives with wife. He has three children. He enjoys Diplomatic Services operational officer.   Social Drivers of Corporate investment banker Strain: Low Risk  (04/02/2024)   Received from [facility]   Overall Financial Resource Strain (CARDIA)    Difficulty of Paying Living Expenses: Not hard at all  Food Insecurity: No Food Insecurity (04/02/2024)   Received from [facility]   Hunger Vital Sign    Within the past 12 months, you worried that your food would run out before you got the money to buy more.: Never true    Within the past 12 months, the food you bought just didn't last and you didn't have money to get more.: Never true  Transportation Needs: No Transportation Needs (04/02/2024)   Received from [facility] - Transportation    Lack of Transportation (Medical): No    Lack of Transportation (Non-Medical): No  Physical Activity: Inactive (12/11/2023)   Exercise Vital Sign    Days of Exercise per Week: 0 days    Minutes of Exercise per Session: 0 min  Stress: No Stress Concern Present (03/04/2024)   Received from [facility] - Occupational Stress Questionnaire    Feeling of Stress : Only a little  Social Connections: Moderately Isolated  (12/11/2023)   Social Connection and Isolation Panel    Frequency of Communication with Friends and Family: Twice a week    Frequency of Social Gatherings with Friends and Family: Three times a week    Attends Religious Services: Never    Active Member of Clubs or Organizations: No    Attends Banker Meetings: Never    Marital Status: Married     Family History: The patient's family history includes Alcohol abuse in his father, paternal grandfather, and paternal grandmother; Arthritis in  his maternal grandmother; Diabetes in his maternal aunt.  ROS:   Please see the history of present illness.     All other systems reviewed and are negative.  EKGs/Labs/Other Studies Reviewed:    The following studies were reviewed today:  EKG Interpretation Date/Time:  Monday July 01 2024 14:46:00 EDT Ventricular Rate:  106 PR Interval:  226 QRS Duration:  98 QT Interval:  346 QTC Calculation: 459 R Axis:   -42  Text Interpretation: Sinus tachycardia with 1st degree A-V block with occasional Premature ventricular complexes Left axis deviation Incomplete right bundle branch block Confirmed by Darliss Rogue ([id number]) on 07/01/2024 3:17:04 PM    Recent Labs: 05/03/2024: ALT 26; BUN 15; Creatinine, Ser 0.97; Hemoglobin 14.4; Platelets 331; Potassium 4.4; Sodium 139; TSH 1.620  Recent Lipid Panel    Component Value Date/Time   CHOL 162 05/03/2024 1509   TRIG 138 05/03/2024 1509   HDL 43 05/03/2024 1509   CHOLHDL 3.8 05/03/2024 1509   CHOLHDL 4 07/14/2023 1136   VLDL 23.4 07/14/2023 1136   LDLCALC 95 05/03/2024 1509   LDLCALC 79 07/02/2018 1158     Risk Assessment/Calculations:             Physical Exam:    VS:  BP 109/71 (BP Location: Left Arm, Patient Position: Sitting, Cuff Size: Normal)   Pulse (!) 106   Ht 5' 7 (1.702 m)   Wt 289 lb (131.1 kg)   SpO2 93%   BMI 45.26 kg/m     Wt Readings from Last 3 Encounters:  07/01/24 289 lb (131.1 kg)  06/18/24 250 lb  (113.4 kg)  05/03/24 279 lb (126.6 kg)     GEN:  Well nourished, well developed in no acute distress HEENT: Normal NECK: No JVD; No carotid bruits CARDIAC: RRR, no murmurs, rubs, gallops RESPIRATORY:  Clear to auscultation without rales, wheezing or rhonchi  ABDOMEN: Soft, non-tender, non-distended MUSCULOSKELETAL:  No edema; No deformity  SKIN: Warm and dry NEUROLOGIC:  Alert and oriented x 3 PSYCHIATRIC:  Normal affect   ASSESSMENT:    1. Pre-procedural cardiovascular examination   2. Incomplete RBBB   3. Morbid obesity (HCC)    PLAN:    In order of problems listed above:  Preop evaluation, EKG shows sinus rhythm, first-degree AV block, incomplete right bundle branch block.  Patient denies chest pain.  Shortness of breath likely from morbid obesity.  He ambulates with a scooter.  Okay for right shoulder surgery from a cardiac perspective.  Shoulder surgery typically deemed low risk from a cardiac perspective. Complete right bundle branch block noted on ECG.  Obtain echocardiogram.  Echo scheduling should not withhold surgery per #1 above. Morbid obesity, low-calorie diet, weight loss advised.  Follow-up after echo, okay to follow-up as needed.  Echocardiogram otherwise shows no significant structural abnormalities..     Medication Adjustments/Labs and Tests Ordered: Current medicines are reviewed at length with the patient today.  Concerns regarding medicines are outlined above.  Orders Placed This Encounter  Procedures   EKG 12-Lead   ECHOCARDIOGRAM COMPLETE   No orders of the defined types were placed in this encounter.   Patient Instructions  Medication Instructions:  No changes *If you need a refill on your cardiac medications before your next appointment, please call your pharmacy*  Lab Work: None ordered If you have labs (blood work) drawn today and your tests are completely normal, you will receive your results only by: MyChart Message (if you have MyChart)  OR A  paper copy in the mail If you have any lab test that is abnormal or we need to change your treatment, we will call you to review the results.  Testing/Procedures: Your physician has requested that you have an echocardiogram. Echocardiography is a painless test that uses sound waves to create images of your heart. It provides your doctor with information about the size and shape of your heart and how well your heart's chambers and valves are working.   You may receive an ultrasound enhancing agent through an IV if needed to better visualize your heart during the echo. This procedure takes approximately one hour.  There are no restrictions for this procedure.  This will take place at [address] [facility]) [address], [place] [address]  Please note: We ask at that you not bring children with you during ultrasound (echo/ vascular) testing. Due to room size and safety concerns, children are not allowed in the ultrasound rooms during exams. Our front office staff cannot provide observation of children in our lobby area while testing is being conducted. An adult accompanying a patient to their appointment will only be allowed in the ultrasound room at the discretion of the ultrasound technician under special circumstances. We apologize for any inconvenience.   Follow-Up: At [facility], you and your health needs are our priority.  As part of our continuing mission to provide you with exceptional heart care, our providers are all part of one team.  This team includes your primary Cardiologist (physician) and Advanced Practice Providers or APPs (Physician Assistants and Nurse Practitioners) who all work together to provide you with the care you need, when you need it.  Your next appointment:   4 month(s)  Provider:   You may see Dr. Darliss or one of the following Advanced Practice Providers on your designated Care Team:   Lonni Meager, NP Lesley Maffucci,  PA-C Bernardino Bring, PA-C Cadence Rutherfordton, PA-C Tylene Lunch, NP Barnie Hila, NP    We recommend signing up for the patient portal called MyChart.  Sign up information is provided on this After Visit Summary.  MyChart is used to connect with patients for Virtual Visits (Telemedicine).  Patients are able to view lab/test results, encounter notes, upcoming appointments, etc.  Non-urgent messages can be sent to your provider as well.   To learn more about what you can do with MyChart, go to [email or web address].          Signed, Redell Darliss, MD  07/01/2024 4:01 PM    [facility]

## 2024-07-01 NOTE — Patient Instructions (Signed)
 Medication Instructions:  No changes *If you need a refill on your cardiac medications before your next appointment, please call your pharmacy*  Lab Work: None ordered If you have labs (blood work) drawn today and your tests are completely normal, you will receive your results only by: MyChart Message (if you have MyChart) OR A paper copy in the mail If you have any lab test that is abnormal or we need to change your treatment, we will call you to review the results.  Testing/Procedures: Your physician has requested that you have an echocardiogram. Echocardiography is a painless test that uses sound waves to create images of your heart. It provides your doctor with information about the size and shape of your heart and how well your heart's chambers and valves are working.   You may receive an ultrasound enhancing agent through an IV if needed to better visualize your heart during the echo. This procedure takes approximately one hour.  There are no restrictions for this procedure.  This will take place at [address] [facility] [address], [place] [address]  Please note: We ask at that you not bring children with you during ultrasound (echo/ vascular) testing. Due to room size and safety concerns, children are not allowed in the ultrasound rooms during exams. Our front office staff cannot provide observation of children in our lobby area while testing is being conducted. An adult accompanying a patient to their appointment will only be allowed in the ultrasound room at the discretion of the ultrasound technician under special circumstances. We apologize for any inconvenience.   Follow-Up: At [facility], you and your health needs are our priority.  As part of our continuing mission to provide you with exceptional heart care, our providers are all part of one team.  This team includes your primary Cardiologist (physician) and Advanced Practice Providers or APPs  (Physician Assistants and Nurse Practitioners) who all work together to provide you with the care you need, when you need it.  Your next appointment:   4 month(s)  Provider:   You may see Dr. Darliss or one of the following Advanced Practice Providers on your designated Care Team:   Lonni Meager, NP Lesley Maffucci, PA-C Bernardino Bring, PA-C Cadence [place], PA-C Tylene Lunch, NP Barnie Hila, NP    We recommend signing up for the patient portal called MyChart.  Sign up information is provided on this After Visit Summary.  MyChart is used to connect with patients for Virtual Visits (Telemedicine).  Patients are able to view lab/test results, encounter notes, upcoming appointments, etc.  Non-urgent messages can be sent to your provider as well.   To learn more about what you can do with MyChart, go to [email or web address].

## 2024-07-02 ENCOUNTER — Encounter: Payer: Self-pay | Admitting: Orthopedic Surgery

## 2024-07-03 NOTE — Progress Notes (Signed)
 Surgical Instructions   Your procedure is scheduled on Tuesday, September 2nd. Report to [facility] Main Entrance A at 9:30 A.M., then check in with the Admitting office. Any questions or running late day of surgery: call [phone]  Questions prior to your surgery date: call [phone], Monday-Friday, 8am-4pm. If you experience any cold or flu symptoms such as cough, fever, chills, shortness of breath, etc. between now and your scheduled surgery, please notify us  at the above number.     Remember:  Do not eat after midnight the night before your surgery  You may drink clear liquids until 8:30 the morning of your surgery.   Clear liquids allowed are: Water, Non-Citrus Juices (without pulp), Carbonated Beverages, Clear Tea (no milk, honey, etc.), Black Coffee Only (NO MILK, CREAM OR POWDERED CREAMER of any kind), and Gatorade.  Patient Instructions  The night before surgery:  No food after midnight. ONLY clear liquids after midnight   The day of surgery (if you have diabetes): Drink ONE (1) 12 oz G2 given to you in your pre admission testing appointment by 8:30 the morning of surgery. Drink in one sitting. Do not sip.  This drink was given to you during your hospital pre-op appointment visit.   Nothing else to drink after completing the 12 oz bottle of G2.         If you have questions, please contact your surgeon's office.     Take these medicines the morning of surgery with A SIP OF WATER: NONE   May take these medicines IF NEEDED: acetaminophen  (TYLENOL )  oxyCODONE  (ROXICODONE )  traMADol  (ULTRAM )    Follow your surgeon's instructions on when to stop Asprin.  If no instructions were given by your surgeon then you will need to call the office to get those instructions.     One week prior to surgery, STOP taking any Aspirin  (unless otherwise instructed by your surgeon) Aleve , Naproxen , Ibuprofen, Motrin, Advil, Goody's, BC's, all herbal medications, fish oil, and  non-prescription vitamins. This includes your diclofenac  (VOLTAREN ) get.          WHAT DO I DO ABOUT MY DIABETES MEDICATION?   Do not take oral diabetes medications (metFORMIN  (GLUCOPHAGE ) ) the morning of surgery. LAST DOSE: Monday, September 1st.  The day of surgery, do not take other diabetes injectables, insulin  regular human CONCENTRATED (HUMULIN  R U-500 KWIKPEN) the morning of surgery.  Last dose should be on Monday, September 1st.  If your CBG is greater than 220 mg/dL, you may take  of your sliding scale (correction) dose of insulin .   HOW TO MANAGE YOUR DIABETES BEFORE AND AFTER SURGERY  Why is it important to control my blood sugar before and after surgery? Improving blood sugar levels before and after surgery helps healing and can limit problems. A way of improving blood sugar control is eating a healthy diet by:  Eating less sugar and carbohydrates  Increasing activity/exercise  Talking with your doctor about reaching your blood sugar goals High blood sugars (greater than 180 mg/dL) can raise your risk of infections and slow your recovery, so you will need to focus on controlling your diabetes during the weeks before surgery. Make sure that the doctor who takes care of your diabetes knows about your planned surgery including the date and location.  How do I manage my blood sugar before surgery? Check your blood sugar at least 4 times a day, starting 2 days before surgery, to make sure that the level is not too high or  low.  Check your blood sugar the morning of your surgery when you wake up and every 2 hours until you get to the Short Stay unit.  If your blood sugar is less than 70 mg/dL, you will need to treat for low blood sugar: Do not take insulin . Treat a low blood sugar (less than 70 mg/dL) with  cup of clear juice (cranberry or apple), 4 glucose tablets, OR glucose gel. Recheck blood sugar in 15 minutes after treatment (to make sure it is greater than 70 mg/dL).  If your blood sugar is not greater than 70 mg/dL on recheck, call [phone] for further instructions. Report your blood sugar to the short stay nurse when you get to Short Stay.  If you are admitted to the hospital after surgery: Your blood sugar will be checked by the staff and you will probably be given insulin  after surgery (instead of oral diabetes medicines) to make sure you have good blood sugar levels. The goal for blood sugar control after surgery is 80-180 mg/dL.            Oral Hygiene is also important to reduce your risk of infection.  Remember - BRUSH YOUR TEETH THE MORNING OF SURGERY WITH YOUR REGULAR TOOTHPASTE  [place]- Preparing for Total Shoulder Arthroplasty  Before surgery, you can play an important role. Because skin is not sterile, your skin needs to be as free of germs as possible. You can reduce the number of germs on your skin by using the following products.   Benzoyl Peroxide Gel  o Reduces the number of germs present on the skin  o Applied twice a day to shoulder area starting two days before surgery   Chlorhexidine  Gluconate (CHG) Soap (instructions listed above on how to wash with CHG Soap)  o An antiseptic cleaner that kills germs and bonds with the skin to continue killing germs even after washing  o Used for showering the night before surgery and morning of surgery   ==================================================================  Please follow these instructions carefully:  BENZOYL PEROXIDE 5% GEL  Please do not use if you have an allergy to benzoyl peroxide. If your skin becomes reddened/irritated stop using the benzoyl peroxide.  Starting two days before surgery, apply as follows:  1. Apply benzoyl peroxide in the morning and at night. Apply after taking a shower. If you are not taking a shower clean entire shoulder front, back, and side along with the armpit with a clean wet washcloth.  2. Place a quarter-sized dollop on your  SHOULDER and rub in thoroughly, making sure to cover the front, back, and side of your shoulder, along with the armpit.   2 Days prior to Surgery First Dose on _____________ Morning Second Dose on ______________ Night  Day Before Surgery First Dose on ______________ Morning Night before surgery wash (entire body except face and private areas) with CHG Soap THEN Second Dose on ____________ Night   Morning of Surgery  wash BODY AGAIN with CHG Soap   4. Do NOT apply benzoyl peroxide gel on the day of surgery   [place]- Preparing For Surgery  Before surgery, you can play an important role. Because skin is not sterile, your skin needs to be as free of germs as possible. You can reduce the number of germs on your skin by washing with CHG (chlorahexidine gluconate) Soap before surgery.  CHG is an antiseptic cleaner which kills germs and bonds with the skin to continue killing germs even after washing.  Please do not use if you have an allergy to CHG or antibacterial soaps. If your skin becomes reddened/irritated stop using the CHG.  Do not shave (including legs and underarms) for at least 48 hours prior to first CHG shower. It is OK to shave your face.  Please follow these instructions carefully.     Shower the NIGHT BEFORE SURGERY and the MORNING OF SURGERY with CHG Soap.   If you chose to wash your hair, wash your hair first as usual with your normal shampoo. After you shampoo, rinse your hair and body thoroughly to remove the shampoo.  Then [employer] and genitals (private parts) with your normal soap and rinse thoroughly to remove soap.  After that Use CHG Soap as you would any other liquid soap. You can apply CHG directly to the skin and wash gently with a scrungie or a clean washcloth.   Apply the CHG Soap to your body ONLY FROM THE NECK DOWN.  Do not use on open wounds or open sores. Avoid contact with your eyes, ears, mouth and genitals (private parts). Wash Face and genitals  (private parts)  with your normal soap.   Wash thoroughly, paying special attention to the area where your surgery will be performed.  Thoroughly rinse your body with warm water from the neck down.  DO NOT shower/wash with your normal soap after using and rinsing off the CHG Soap.  Pat yourself dry with a CLEAN TOWEL.  8. Apply the Benzoyl Peroxide only the night before surgery.  Do Not use it the morning of surgery.  Wear CLEAN PAJAMAS to bed the night before surgery  Place CLEAN SHEETS on your bed the night before your surgery  DO NOT SLEEP WITH PETS.   Day of Surgery: Take a shower with CHG soap. Wear Clean/Comfortable clothing the morning of surgery Do not apply any deodorants/lotions.   Remember to brush your teeth WITH YOUR REGULAR TOOTHPASTE.   Please read over the following fact sheets that you were given.      Do NOT Smoke (Tobacco/Vaping) for 24 hours prior to your procedure.  If you use a CPAP at night, you may bring your mask/headgear for your overnight stay.   You will be asked to remove any contacts, glasses, piercing's, hearing aid's, dentures/partials prior to surgery. Please bring cases for these items if needed.    Patients discharged the day of surgery will not be allowed to drive home, and someone needs to stay with them for 24 hours.  SURGICAL WAITING ROOM VISITATION Patients may have no more than 2 support people in the waiting area - these visitors may rotate.   Pre-op nurse will coordinate an appropriate time for 1 ADULT support person, who may not rotate, to accompany patient in pre-op.  Children under the age of 17 must have an adult with them who is not the patient and must remain in the main waiting area with an adult.  If the patient needs to stay at the hospital during part of their recovery, the visitor guidelines for inpatient rooms apply.  Please refer to the [facility] website for the visitor guidelines for any additional  information.   If you received a COVID test during your pre-op visit  it is requested that you wear a mask when out in public, stay away from anyone that may not be feeling well and notify your surgeon if you develop symptoms. If you have been in contact with anyone that has tested positive  in the last 10 days please notify you surgeon.      Pre-operative 5 CHG Bathing Instructions   You can play a key role in reducing the risk of infection after surgery. Your skin needs to be as free of germs as possible. You can reduce the number of germs on your skin by washing with CHG (chlorhexidine  gluconate) soap before surgery. CHG is an antiseptic soap that kills germs and continues to kill germs even after washing.   DO NOT use if you have an allergy to chlorhexidine /CHG or antibacterial soaps. If your skin becomes reddened or irritated, stop using the CHG and notify one of our RNs at [phone].   Please shower with the CHG soap starting 4 days before surgery using the following schedule:     Please keep in mind the following:  DO NOT shave, including legs and underarms, starting the day of your first shower.   You may shave your face at any point before/day of surgery.  Place clean sheets on your bed the day you start using CHG soap. Use a clean washcloth (not used since being washed) for each shower. DO NOT sleep with pets once you start using the CHG.   CHG Shower Instructions:  Wash your face and private area with normal soap. If you choose to wash your hair, wash first with your normal shampoo.  After you use shampoo/soap, rinse your hair and body thoroughly to remove shampoo/soap residue.  Turn the water OFF and apply about 3 tablespoons (45 ml) of CHG soap to a CLEAN washcloth.  Apply CHG soap ONLY FROM YOUR NECK DOWN TO YOUR TOES (washing for 3-5 minutes)  DO NOT use CHG soap on face, private areas, open wounds, or sores.  Pay special attention to the area where your surgery is  being performed.  If you are having back surgery, having someone wash your back for you may be helpful. Wait 2 minutes after CHG soap is applied, then you may rinse off the CHG soap.  Pat dry with a clean towel  Put on clean clothes/pajamas   If you choose to wear lotion, please use ONLY the CHG-compatible lotions that are listed below.  Additional instructions for the day of surgery: DO NOT APPLY any lotions, deodorants, cologne, or perfumes.   Do not bring valuables to the hospital. [facility] is not responsible for any belongings/valuables. Do not wear nail polish, gel polish, artificial nails, or any other type of covering on natural nails (fingers and toes) Do not wear jewelry or makeup Put on clean/comfortable clothes.  Please brush your teeth.  Ask your nurse before applying any prescription medications to the skin.     CHG Compatible Lotions   Aveeno Moisturizing lotion  Cetaphil Moisturizing Cream  Cetaphil Moisturizing Lotion  Clairol Herbal Essence Moisturizing Lotion, Dry Skin  Clairol Herbal Essence Moisturizing Lotion, Extra Dry Skin  Clairol Herbal Essence Moisturizing Lotion, Normal Skin  Curel Age Defying Therapeutic Moisturizing Lotion with Alpha Hydroxy  Curel Extreme Care Body Lotion  Curel Soothing Hands Moisturizing Hand Lotion  Curel Therapeutic Moisturizing Cream, Fragrance-Free  Curel Therapeutic Moisturizing Lotion, Fragrance-Free  Curel Therapeutic Moisturizing Lotion, Original Formula  Eucerin Daily Replenishing Lotion  Eucerin Dry Skin Therapy Plus Alpha Hydroxy Crme  Eucerin Dry Skin Therapy Plus Alpha Hydroxy Lotion  Eucerin Original Crme  Eucerin Original Lotion  Eucerin Plus Crme Eucerin Plus Lotion  Eucerin TriLipid Replenishing Lotion  Keri Anti-Bacterial Hand Lotion  Keri Deep Conditioning Original Lotion Dry  Skin Formula Softly Scented  Keri Deep Conditioning Original Lotion, Fragrance Free Sensitive Skin Formula  Keri Lotion Fast  Absorbing Fragrance Free Sensitive Skin Formula  Keri Lotion Fast Absorbing Softly Scented Dry Skin Formula  Keri Original Lotion  Keri Skin Renewal Lotion Keri Silky Smooth Lotion  Keri Silky Smooth Sensitive Skin Lotion  Nivea Body Creamy Conditioning Oil  Nivea Body Extra Enriched Lotion  Nivea Body Original Lotion  Nivea Body Sheer Moisturizing Lotion Nivea Crme  Nivea Skin Firming Lotion  NutraDerm 30 Skin Lotion  NutraDerm Skin Lotion  NutraDerm Therapeutic Skin Cream  NutraDerm Therapeutic Skin Lotion  ProShield Protective Hand Cream  Provon moisturizing lotion  Please read over the following fact sheets that you were given.

## 2024-07-04 ENCOUNTER — Encounter (HOSPITAL_COMMUNITY)
Admission: RE | Admit: 2024-07-04 | Discharge: 2024-07-04 | Disposition: A | Discharge status: Home / Self Care | Source: Ambulatory Visit | Attending: Orthopedic Surgery | Admitting: Orthopedic Surgery

## 2024-07-04 ENCOUNTER — Other Ambulatory Visit: Payer: Self-pay

## 2024-07-04 ENCOUNTER — Encounter (HOSPITAL_COMMUNITY): Payer: Self-pay

## 2024-07-04 VITALS — BP 144/92 | HR 100 | Temp 98.4°F | Resp 17 | Ht 66.0 in | Wt 289.0 lb

## 2024-07-04 DIAGNOSIS — Z794 Long term (current) use of insulin: Secondary | ICD-10-CM | POA: Insufficient documentation

## 2024-07-04 DIAGNOSIS — R569 Unspecified convulsions: Secondary | ICD-10-CM | POA: Insufficient documentation

## 2024-07-04 DIAGNOSIS — I4510 Unspecified right bundle-branch block: Secondary | ICD-10-CM | POA: Insufficient documentation

## 2024-07-04 DIAGNOSIS — E785 Hyperlipidemia, unspecified: Secondary | ICD-10-CM | POA: Diagnosis not present

## 2024-07-04 DIAGNOSIS — Z01812 Encounter for preprocedural laboratory examination: Secondary | ICD-10-CM | POA: Insufficient documentation

## 2024-07-04 DIAGNOSIS — I10 Essential (primary) hypertension: Secondary | ICD-10-CM | POA: Insufficient documentation

## 2024-07-04 DIAGNOSIS — Z87891 Personal history of nicotine dependence: Secondary | ICD-10-CM | POA: Insufficient documentation

## 2024-07-04 DIAGNOSIS — G8929 Other chronic pain: Secondary | ICD-10-CM | POA: Insufficient documentation

## 2024-07-04 DIAGNOSIS — G4733 Obstructive sleep apnea (adult) (pediatric): Secondary | ICD-10-CM | POA: Insufficient documentation

## 2024-07-04 DIAGNOSIS — E66813 Obesity, class 3: Secondary | ICD-10-CM | POA: Diagnosis not present

## 2024-07-04 DIAGNOSIS — E1142 Type 2 diabetes mellitus with diabetic polyneuropathy: Secondary | ICD-10-CM | POA: Diagnosis not present

## 2024-07-04 DIAGNOSIS — Z01818 Encounter for other preprocedural examination: Secondary | ICD-10-CM

## 2024-07-04 DIAGNOSIS — Z6841 Body Mass Index (BMI) 40.0 and over, adult: Secondary | ICD-10-CM | POA: Diagnosis not present

## 2024-07-04 LAB — BASIC METABOLIC PANEL WITH GFR
Anion gap: 12 (ref 5–15)
BUN: 18 mg/dL (ref 8–23)
CO2: 21 mmol/L — ABNORMAL LOW (ref 22–32)
Calcium: 9.3 mg/dL (ref 8.9–10.3)
Chloride: 107 mmol/L (ref 98–111)
Creatinine, Ser: 0.88 mg/dL (ref 0.61–1.24)
GFR, Estimated: >60 mL/min (ref >60)
Glucose, Bld: 147 mg/dL — ABNORMAL HIGH (ref 70–99)
Potassium: 4.1 mmol/L (ref 3.5–5.1)
Sodium: 140 mmol/L (ref 135–145)

## 2024-07-04 LAB — CBC
HCT: 42.2 % (ref 39.0–52.0)
Hemoglobin: 13.9 g/dL (ref 13.0–17.0)
MCH: 31.6 pg (ref 26.0–34.0)
MCHC: 32.9 g/dL (ref 30.0–36.0)
MCV: 95.9 fL (ref 80.0–100.0)
Platelets: 214 K/uL (ref 150–400)
RBC: 4.4 MIL/uL (ref 4.22–5.81)
RDW: 14.4 % (ref 11.5–15.5)
WBC: 10.2 K/uL (ref 4.0–10.5)
nRBC: 0 % (ref 0.0–0.2)

## 2024-07-04 LAB — SURGICAL PCR SCREEN
MRSA, PCR: NEGATIVE
Staphylococcus aureus: NEGATIVE

## 2024-07-04 LAB — GLUCOSE, CAPILLARY: Glucose-Capillary: 139 mg/dL — ABNORMAL HIGH (ref 70–99)

## 2024-07-04 NOTE — Progress Notes (Signed)
  PCP - Dr. Debby Petties Cardiologist - Dr. Redell Agbor-Etang LOV: 07-01-24 with follow up in 4 months.  PPM/ICD - Denies Device Orders - n/a Rep Notified - n/a  Chest x-ray - 06-14-24 EKG - 07-01-24 Stress Test - Denies ECHO - 02-23-24 CE Cardiac Cath - Denies  Sleep Study - Yes CPAP - denies wearing cpap; costs to much and insurance doesn't help  Fasting Blood Sugar - 140-150 Checks Blood Sugar: wears a Freestyle Libre 3, upgrading to 3A.   Last dose of GLP1 agonist- Denies GLP1 instructions: n/a  Blood Thinner Instructions: Denies Aspirin  Instructions: Denies  ERAS Protcol - Clears until 0830 PRE-SURGERY Ensure or G2- G2  COVID TEST- n/a   Anesthesia review: Yes, DM, sleep apnea, HTN,   Patient denies shortness of breath, fever, cough and chest pain at PAT appointment. Patient denies any respiratory issues at this time.    All instructions explained to the patient, with a verbal understanding of the material. Patient agrees to go over the instructions while at home for a better understanding. Patient also instructed to self quarantine after being tested for COVID-19. The opportunity to ask questions was provided.

## 2024-07-04 NOTE — Progress Notes (Signed)
 Patient unable to provide urine sample.  Patient instructed that he will have to give a urine sample on day of surgery upon arrival.  RN reviewed the day of surgery instruction regarding liquid intake. Patient voiced he understood and had no further questions.

## 2024-07-04 NOTE — Telephone Encounter (Signed)
 Thx I think you are right

## 2024-07-05 NOTE — Progress Notes (Addendum)
 Anesthesia Chart Review:  73 yo male with pertinent hx including former smoker (40 pack years, quit 2005), HTN, baseline sinus tachycardia, OSA noncompliant with CPAP, HLD, insulin -dependent diabetes on u500 insulin  (A1c 6.0 on 05/03/24), polyneuropathy, chronic pain, uses wheelchair outside the house due to orthopedic pain, class 3 obesity BMI 47, remote hx of seizures (reportedly ~48 years go, none since).  He was seen in the preop clinic at [facility] on 02/21/24 and echo was ordered at that time for eval of DOE. TTE 02/23/24 showed LVEF 45-50%, severe concentric hypertrophy, normal RV, normal valves.Subsequently underwent L4-S1 TLIF 03/04/24 at [facility]. Per notes, glidescope used electively.   Seen by cardiologist Dr. Darliss on 07/01/24 at the request of Dr. Addie for preop eval. Per note, 1.Preop evaluation, EKG shows sinus rhythm, first-degree AV block, incomplete right bundle branch block.  Patient denies chest pain.  Shortness of breath likely from morbid obesity.  He ambulates with a scooter.  Okay for right shoulder surgery from a cardiac perspective.  Shoulder surgery typically deemed low risk from a cardiac perspective. 2.Complete right bundle branch block noted on ECG.  Obtain echocardiogram.  Echo scheduling should not withhold surgery per #1 above.  Preop labs reviewed, unremarkable.   History reviewed with anesthesiologist Dr. Patrisha.  He advised okay to proceed as planned barring acute status change.  EKG 07/01/24: Sinus tachycardia with 1st degree A-V block with occasional Premature ventricular complexes. Rate 106. Left axis deviation. Incomplete right bundle branch block  TTE 02/23/24 (care everywhere):   Aorta: The aortic root is at upper limit of normal in size - 3.5cm. The  ascending aorta is mildly dilated - 4.1cm.    Aortic Valve: The aortic valve is tricuspid. The leaflets are mildly  thickened and exhibit normal excursion.    Left Ventricle: There is mild  hypokinesis of the  left ventricle.    Left Ventricle: Systolic function is mildly abnormal. EF: 45-50%.  Quantitative analysis of left ventricular Global Longitudinal Strain (GLS)  imaging is -7.900%, which is abnormal.    Left Ventricle: Doppler parameters are indeterminate for diastolic  function.  Cannot exclude grade 1 diastolic dysfunction.    Left Ventricle: There is moderate to severe concentric hypertrophy.    Tricuspid Valve: The right ventricular systolic pressure is normal (<36  mmHg).      Lynwood Geofm RIGGERS [facility] Short Stay Center/Anesthesiology Phone [phone] 07/05/2024 2:22 PM

## 2024-07-05 NOTE — Anesthesia Preprocedure Evaluation (Addendum)
 Anesthesia Evaluation  Patient identified by MRN, date of birth, ID band Patient awake    Reviewed: Allergy & Precautions, NPO status , Patient's Chart, lab work & pertinent test results  Airway Mallampati: III  TM Distance: >3 FB Neck ROM: Full    Dental  (+) Dental Advisory Given, Teeth Intact   Pulmonary sleep apnea , pneumonia, former smoker   Pulmonary exam normal breath sounds clear to auscultation       Cardiovascular hypertension, Pt. on medications +CHF  Normal cardiovascular exam Rhythm:Regular Rate:Normal  TTE 02/23/24 (care everywhere):   Aorta: The aortic root is at upper limit of normal in size - 3.5cm. The  ascending aorta is mildly dilated - 4.1cm.    Aortic Valve: The aortic valve is tricuspid. The leaflets are mildly  thickened and exhibit normal excursion.    Left Ventricle: There is mild  hypokinesis of the left ventricle.    Left Ventricle: Systolic function is mildly abnormal. EF: 45-50%.  Quantitative analysis of left ventricular Global Longitudinal Strain (GLS)  imaging is -7.900%, which is abnormal.    Left Ventricle: Doppler parameters are indeterminate for diastolic  function.  Cannot exclude grade 1 diastolic dysfunction.    Left Ventricle: There is moderate to severe concentric hypertrophy.    Tricuspid Valve: The right ventricular systolic pressure is normal (<36  mmHg).     Neuro/Psych Seizures -,   Neuromuscular disease    GI/Hepatic ,GERD  Controlled,,  Endo/Other  diabetes    Renal/GU Renal disease     Musculoskeletal  (+) Arthritis ,    Abdominal   Peds  Hematology  (+) DOES NOT REFUSE BLOOD PRODUCTS  Anesthesia Other Findings   Reproductive/Obstetrics                              Anesthesia Physical Anesthesia Plan  ASA: 4  Anesthesia Plan: General   Post-op Pain Management: Regional block* and Ofirmev  IV (intra-op)*   Induction:  Intravenous  PONV Risk Score and Plan: 2 and Ondansetron , Dexamethasone  and Treatment may vary due to age or medical condition  Airway Management Planned: Oral ETT and Video Laryngoscope Planned  Additional Equipment: ClearSight  Intra-op Plan:   Post-operative Plan: Extubation in OR  Informed Consent: I have reviewed the patients History and Physical, chart, labs and discussed the procedure including the risks, benefits and alternatives for the proposed anesthesia with the patient or authorized representative who has indicated his/her understanding and acceptance.     Dental advisory given  Plan Discussed with: CRNA  Anesthesia Plan Comments: (Risks of anesthesia explained at length. This includes, but is not limited to, sore throat, damage to teeth, lips gums, tongue and vocal cords, nausea and vomiting, reactions to medications, stroke, heart attack, and death. All patient questions were answered and the patient wishes to proceed. Risks of peripheral nerve block explained at length. This includes, but is not limited to, bleeding, infection, reactions to the medications, seizures, damage to surrounding structures, damage to nerves, permanent weakness, numbness, tingling and pain. All patient questions were answered and patient wishes to proceed with nerve block.   PAT note by Lynwood Hope, PA-C: 73 yo male with pertinent hx including former smoker (40 pack years, quit 2005), HTN, baseline sinus tachycardia, OSA noncompliant with CPAP, HLD, insulin -dependent diabetes on u500 insulin  (A1c 6.0 on 05/03/24), polyneuropathy, chronic pain, uses wheelchair outside the house due to orthopedic pain, class 3 obesity BMI 47, remote  hx of seizures (reportedly ~48 years go, none since).  He was seen in the preop clinic at [facility] on 02/21/24 and echo was ordered at that time for eval of DOE. TTE 02/23/24 showed LVEF 45-50%, severe concentric hypertrophy, normal RV, normal valves.Subsequently underwent  L4-S1 TLIF 03/04/24 at [facility]. Per notes, glidescope used electively.   Seen by cardiologist Dr. Darliss on 07/01/24 at the request of Dr. Addie for preop eval. Per note, 1.Preop evaluation, EKG shows sinus rhythm, first-degree AV block, incomplete right bundle branch block.  Patient denies chest pain.  Shortness of breath likely from morbid obesity.  He ambulates with a scooter.  Okay for right shoulder surgery from a cardiac perspective.  Shoulder surgery typically deemed low risk from a cardiac perspective. 2.Complete right bundle branch block noted on ECG.  Obtain echocardiogram.  Echo scheduling should not withhold surgery per #1 above.  Preop labs reviewed, unremarkable.   History reviewed with anesthesiologist Dr. Patrisha.  He advised okay to proceed as planned barring acute status change.  EKG 07/01/24: Sinus tachycardia with 1st degree A-V block with occasional Premature ventricular complexes. Rate 106. Left axis deviation. Incomplete right bundle branch block  TTE 02/23/24 (care everywhere):  Aorta: The aortic root is at upper limit of normal in size - 3.5cm. The  ascending aorta is mildly dilated - 4.1cm.   AorticValve: The aortic valve is tricuspid. The leaflets are mildly  thickened and exhibit normal excursion.   LeftVentricle: There is mild hypokinesis of the left ventricle.   LeftVentricle: Systolic function is mildly abnormal. EF: 45-50%.  Quantitative analysis of left ventricular Global Longitudinal Strain (GLS)  imaging is -7.900%, which is abnormal.   LeftVentricle: Doppler parameters are indeterminate for diastolic  function. Cannot exclude grade 1 diastolic dysfunction.   LeftVentricle: There is moderate to severe concentric hypertrophy.  TricuspidValve: The right ventricular systolic pressure is normal (<36  mmHg).    )        Anesthesia Quick Evaluation

## 2024-07-09 ENCOUNTER — Ambulatory Visit (HOSPITAL_COMMUNITY): Payer: Self-pay | Admitting: Anesthesiology

## 2024-07-09 ENCOUNTER — Other Ambulatory Visit: Payer: Self-pay

## 2024-07-09 ENCOUNTER — Encounter (HOSPITAL_COMMUNITY): Payer: Self-pay | Admitting: Orthopedic Surgery

## 2024-07-09 ENCOUNTER — Observation Stay (HOSPITAL_COMMUNITY)

## 2024-07-09 ENCOUNTER — Observation Stay (HOSPITAL_COMMUNITY)
Admission: RE | Admit: 2024-07-09 | Discharge: 2024-07-12 | Disposition: A | Discharge status: Home / Self Care | Attending: Orthopedic Surgery | Admitting: Orthopedic Surgery

## 2024-07-09 ENCOUNTER — Ambulatory Visit (HOSPITAL_COMMUNITY): Payer: Self-pay | Admitting: Physician Assistant

## 2024-07-09 ENCOUNTER — Encounter (HOSPITAL_COMMUNITY)
Admission: RE | Disposition: A | Discharge status: Home / Self Care | Payer: Self-pay | Source: Home / Self Care | Attending: Orthopedic Surgery

## 2024-07-09 ENCOUNTER — Encounter: Payer: Self-pay | Admitting: Sports Medicine

## 2024-07-09 DIAGNOSIS — M19019 Primary osteoarthritis, unspecified shoulder: Secondary | ICD-10-CM | POA: Diagnosis present

## 2024-07-09 DIAGNOSIS — L97419 Non-pressure chronic ulcer of right heel and midfoot with unspecified severity: Secondary | ICD-10-CM | POA: Insufficient documentation

## 2024-07-09 DIAGNOSIS — M19011 Primary osteoarthritis, right shoulder: Secondary | ICD-10-CM | POA: Diagnosis not present

## 2024-07-09 DIAGNOSIS — Z87891 Personal history of nicotine dependence: Secondary | ICD-10-CM | POA: Diagnosis not present

## 2024-07-09 DIAGNOSIS — Z7984 Long term (current) use of oral hypoglycemic drugs: Secondary | ICD-10-CM | POA: Insufficient documentation

## 2024-07-09 DIAGNOSIS — E11621 Type 2 diabetes mellitus with foot ulcer: Secondary | ICD-10-CM | POA: Diagnosis not present

## 2024-07-09 DIAGNOSIS — Z01818 Encounter for other preprocedural examination: Principal | ICD-10-CM

## 2024-07-09 DIAGNOSIS — G8918 Other acute postprocedural pain: Secondary | ICD-10-CM | POA: Diagnosis not present

## 2024-07-09 DIAGNOSIS — I509 Heart failure, unspecified: Secondary | ICD-10-CM | POA: Diagnosis not present

## 2024-07-09 DIAGNOSIS — M25511 Pain in right shoulder: Secondary | ICD-10-CM | POA: Diagnosis present

## 2024-07-09 DIAGNOSIS — I110 Hypertensive heart disease with heart failure: Secondary | ICD-10-CM | POA: Insufficient documentation

## 2024-07-09 DIAGNOSIS — Z96651 Presence of right artificial knee joint: Secondary | ICD-10-CM | POA: Insufficient documentation

## 2024-07-09 DIAGNOSIS — Z96611 Presence of right artificial shoulder joint: Secondary | ICD-10-CM | POA: Diagnosis not present

## 2024-07-09 DIAGNOSIS — Z7982 Long term (current) use of aspirin: Secondary | ICD-10-CM | POA: Diagnosis not present

## 2024-07-09 DIAGNOSIS — Z471 Aftercare following joint replacement surgery: Secondary | ICD-10-CM | POA: Diagnosis not present

## 2024-07-09 HISTORY — PX: REVERSE SHOULDER ARTHROPLASTY: SHX5054

## 2024-07-09 LAB — GLUCOSE, CAPILLARY
Glucose-Capillary: 142 mg/dL — ABNORMAL HIGH (ref 70–99)
Glucose-Capillary: 123 mg/dL — ABNORMAL HIGH (ref 70–99)
Glucose-Capillary: 185 mg/dL — ABNORMAL HIGH (ref 70–99)
Glucose-Capillary: 182 mg/dL — ABNORMAL HIGH (ref 70–99)

## 2024-07-09 SURGERY — ARTHROPLASTY, SHOULDER, TOTAL, REVERSE
Anesthesia: General | Site: Shoulder | Laterality: Right

## 2024-07-09 MED ORDER — METHOCARBAMOL 500 MG PO TABS
500.0000 mg | ORAL_TABLET | Freq: Four times a day (QID) | ORAL | Status: DC | PRN
  Administered 2024-07-09 – 2024-07-11 (×3): 500 mg via ORAL
  Filled 2024-07-09 (×3): qty 1

## 2024-07-09 MED ORDER — INSULIN ASPART 100 UNIT/ML IJ SOLN: 0.0000 [IU] | INTRAMUSCULAR | Status: DC | PRN

## 2024-07-09 MED ORDER — ASPIRIN 81 MG PO TBEC
81.0000 mg | DELAYED_RELEASE_TABLET | Freq: Every day | ORAL | Status: DC
  Administered 2024-07-10 – 2024-07-12 (×3): 81 mg via ORAL
  Filled 2024-07-09 (×3): qty 1

## 2024-07-09 MED ORDER — INSULIN ASPART 100 UNIT/ML IJ SOLN
0.0000 [IU] | Freq: Three times a day (TID) | INTRAMUSCULAR | Status: DC
  Administered 2024-07-09: 2 [IU] via SUBCUTANEOUS
  Administered 2024-07-10: 3 [IU] via SUBCUTANEOUS
  Administered 2024-07-11: 1 [IU] via SUBCUTANEOUS
  Administered 2024-07-12: 2 [IU] via SUBCUTANEOUS
  Administered 2024-07-12: 3 [IU] via SUBCUTANEOUS

## 2024-07-09 MED ORDER — METHOCARBAMOL 1000 MG/10ML IJ SOLN: 500.0000 mg | Freq: Four times a day (QID) | INTRAMUSCULAR | Status: DC | PRN

## 2024-07-09 MED ORDER — CHLORHEXIDINE GLUCONATE 0.12 % MT SOLN
OROMUCOSAL | Status: AC
  Administered 2024-07-09: 15 mL via OROMUCOSAL
  Filled 2024-07-09: qty 15

## 2024-07-09 MED ORDER — DOCUSATE SODIUM 100 MG PO CAPS
100.0000 mg | ORAL_CAPSULE | Freq: Two times a day (BID) | ORAL | Status: DC
  Administered 2024-07-09 – 2024-07-12 (×6): 100 mg via ORAL
  Filled 2024-07-09 (×6): qty 1

## 2024-07-09 MED ORDER — EPHEDRINE SULFATE-NACL 50-0.9 MG/10ML-% IV SOSY
PREFILLED_SYRINGE | INTRAVENOUS | Status: DC | PRN
  Administered 2024-07-09: 10 mg via INTRAVENOUS
  Administered 2024-07-09: 5 mg via INTRAVENOUS
  Administered 2024-07-09: 10 mg via INTRAVENOUS
  Administered 2024-07-09: 5 mg via INTRAVENOUS
  Administered 2024-07-09: 10 mg via INTRAVENOUS
  Administered 2024-07-09: 5 mg via INTRAVENOUS

## 2024-07-09 MED ORDER — HYDROMORPHONE HCL 1 MG/ML IJ SOLN: 0.5000 mg | INTRAMUSCULAR | Status: DC | PRN

## 2024-07-09 MED ORDER — ROCURONIUM BROMIDE 10 MG/ML (PF) SYRINGE
PREFILLED_SYRINGE | INTRAVENOUS | Status: AC
  Filled 2024-07-09: qty 10

## 2024-07-09 MED ORDER — OXYCODONE HCL 5 MG PO TABS
5.0000 mg | ORAL_TABLET | ORAL | Status: DC | PRN
  Administered 2024-07-10: 5 mg via ORAL
  Administered 2024-07-10 – 2024-07-12 (×4): 10 mg via ORAL
  Filled 2024-07-09 (×4): qty 2
  Filled 2024-07-09: qty 1

## 2024-07-09 MED ORDER — SUGAMMADEX SODIUM 200 MG/2ML IV SOLN
INTRAVENOUS | Status: DC | PRN
  Administered 2024-07-09: 200 mg via INTRAVENOUS

## 2024-07-09 MED ORDER — EPHEDRINE 5 MG/ML INJ
INTRAVENOUS | Status: AC
  Filled 2024-07-09: qty 5

## 2024-07-09 MED ORDER — POVIDONE-IODINE 10 % EX SWAB
2.0000 | Freq: Once | CUTANEOUS | Status: AC
  Administered 2024-07-09: 2 via TOPICAL

## 2024-07-09 MED ORDER — CEFAZOLIN SODIUM-DEXTROSE 2-4 GM/100ML-% IV SOLN
2.0000 g | Freq: Four times a day (QID) | INTRAVENOUS | Status: AC
  Administered 2024-07-09 – 2024-07-10 (×2): 2 g via INTRAVENOUS
  Filled 2024-07-09 (×2): qty 100

## 2024-07-09 MED ORDER — CEFAZOLIN SODIUM-DEXTROSE 2-4 GM/100ML-% IV SOLN
INTRAVENOUS | Status: AC
  Filled 2024-07-09: qty 100

## 2024-07-09 MED ORDER — 0.9 % SODIUM CHLORIDE (POUR BTL) OPTIME
TOPICAL | Status: DC | PRN
  Administered 2024-07-09: 1000 mL
  Administered 2024-07-09: 3000 mL

## 2024-07-09 MED ORDER — CELECOXIB 200 MG PO CAPS
200.0000 mg | ORAL_CAPSULE | Freq: Two times a day (BID) | ORAL | Status: DC
  Administered 2024-07-09 – 2024-07-12 (×6): 200 mg via ORAL
  Filled 2024-07-09 (×6): qty 1

## 2024-07-09 MED ORDER — CEFAZOLIN SODIUM 10 G IJ SOLR
INTRAMUSCULAR | Status: DC | PRN
  Administered 2024-07-09: 3 g via INTRAVENOUS

## 2024-07-09 MED ORDER — ONDANSETRON HCL 4 MG PO TABS: 4.0000 mg | ORAL_TABLET | Freq: Four times a day (QID) | ORAL | Status: DC | PRN

## 2024-07-09 MED ORDER — TRAMADOL HCL 50 MG PO TABS
50.0000 mg | ORAL_TABLET | Freq: Two times a day (BID) | ORAL | Status: DC
  Administered 2024-07-09 – 2024-07-12 (×6): 50 mg via ORAL
  Filled 2024-07-09 (×6): qty 1

## 2024-07-09 MED ORDER — INSULIN ASPART 100 UNIT/ML IJ SOLN
3.0000 [IU] | Freq: Once | INTRAMUSCULAR | Status: AC
  Administered 2024-07-09: 3 [IU] via SUBCUTANEOUS

## 2024-07-09 MED ORDER — ONDANSETRON HCL 4 MG/2ML IJ SOLN: 4.0000 mg | Freq: Four times a day (QID) | INTRAMUSCULAR | Status: DC | PRN

## 2024-07-09 MED ORDER — ONDANSETRON HCL 4 MG/2ML IJ SOLN
INTRAMUSCULAR | Status: AC
  Filled 2024-07-09: qty 2

## 2024-07-09 MED ORDER — ROCURONIUM BROMIDE 10 MG/ML (PF) SYRINGE
PREFILLED_SYRINGE | INTRAVENOUS | Status: DC | PRN
  Administered 2024-07-09: 60 mg via INTRAVENOUS

## 2024-07-09 MED ORDER — METFORMIN HCL 500 MG PO TABS
2000.0000 mg | ORAL_TABLET | Freq: Every day | ORAL | Status: DC
  Administered 2024-07-10 – 2024-07-12 (×3): 2000 mg via ORAL
  Filled 2024-07-09 (×3): qty 4

## 2024-07-09 MED ORDER — CEFAZOLIN SODIUM-DEXTROSE 3-4 GM/150ML-% IV SOLN: 3.0000 g | INTRAVENOUS | Status: DC

## 2024-07-09 MED ORDER — VANCOMYCIN HCL 1000 MG IV SOLR
INTRAVENOUS | Status: DC | PRN
  Administered 2024-07-09: 1000 mg via TOPICAL

## 2024-07-09 MED ORDER — PHENYLEPHRINE HCL-NACL 20-0.9 MG/250ML-% IV SOLN
INTRAVENOUS | Status: DC | PRN
  Administered 2024-07-09: 30 ug/min via INTRAVENOUS

## 2024-07-09 MED ORDER — PROPOFOL 10 MG/ML IV BOLUS
INTRAVENOUS | Status: AC
Start: 2024-07-09 — End: 2024-07-09
  Filled 2024-07-09: qty 20

## 2024-07-09 MED ORDER — MIDAZOLAM HCL 2 MG/2ML IJ SOLN
INTRAMUSCULAR | Status: AC
  Filled 2024-07-09: qty 2

## 2024-07-09 MED ORDER — MENTHOL 3 MG MT LOZG: 1.0000 | LOZENGE | OROMUCOSAL | Status: DC | PRN

## 2024-07-09 MED ORDER — ONDANSETRON HCL 4 MG/2ML IJ SOLN
INTRAMUSCULAR | Status: DC | PRN
  Administered 2024-07-09: 4 mg via INTRAVENOUS

## 2024-07-09 MED ORDER — POVIDONE-IODINE 7.5 % EX SOLN: Freq: Once | CUTANEOUS | Status: AC

## 2024-07-09 MED ORDER — EPHEDRINE 5 MG/ML INJ
INTRAVENOUS | Status: AC
Start: 2024-07-09 — End: 2024-07-09
  Filled 2024-07-09: qty 5

## 2024-07-09 MED ORDER — LACTATED RINGERS IV SOLN: INTRAVENOUS | Status: DC

## 2024-07-09 MED ORDER — PROPOFOL 10 MG/ML IV BOLUS
INTRAVENOUS | Status: DC | PRN
  Administered 2024-07-09: 150 mg via INTRAVENOUS

## 2024-07-09 MED ORDER — BUPIVACAINE LIPOSOME 1.3 % IJ SUSP
INTRAMUSCULAR | Status: AC
  Filled 2024-07-09: qty 10

## 2024-07-09 MED ORDER — FENTANYL CITRATE (PF) 100 MCG/2ML IJ SOLN: 25.0000 ug | INTRAMUSCULAR | Status: DC | PRN

## 2024-07-09 MED ORDER — ADULT MULTIVITAMIN W/MINERALS CH
1.0000 | ORAL_TABLET | Freq: Every day | ORAL | Status: DC
  Administered 2024-07-10 – 2024-07-12 (×3): 1 via ORAL
  Filled 2024-07-09 (×3): qty 1

## 2024-07-09 MED ORDER — SODIUM CHLORIDE 0.9 % IV SOLN: INTRAVENOUS | Status: AC

## 2024-07-09 MED ORDER — INSULIN REGULAR HUMAN (CONC) 500 UNIT/ML ~~LOC~~ SOPN
80.0000 [IU] | PEN_INJECTOR | Freq: Every morning | SUBCUTANEOUS | Status: DC
  Administered 2024-07-10 – 2024-07-12 (×3): 80 [IU] via SUBCUTANEOUS
  Filled 2024-07-09: qty 3

## 2024-07-09 MED ORDER — ACETAMINOPHEN 10 MG/ML IV SOLN
INTRAVENOUS | Status: DC | PRN
  Administered 2024-07-09: 1000 mg via INTRAVENOUS

## 2024-07-09 MED ORDER — ACETAMINOPHEN 325 MG PO TABS
325.0000 mg | ORAL_TABLET | Freq: Four times a day (QID) | ORAL | Status: DC | PRN
  Administered 2024-07-10: 650 mg via ORAL
  Filled 2024-07-09: qty 2

## 2024-07-09 MED ORDER — FENTANYL CITRATE (PF) 100 MCG/2ML IJ SOLN: 50.0000 ug | Freq: Once | INTRAMUSCULAR | Status: DC

## 2024-07-09 MED ORDER — METOCLOPRAMIDE HCL 5 MG PO TABS: 5.0000 mg | ORAL_TABLET | Freq: Three times a day (TID) | ORAL | Status: DC | PRN

## 2024-07-09 MED ORDER — DROPERIDOL 2.5 MG/ML IJ SOLN: 0.6250 mg | Freq: Once | INTRAMUSCULAR | Status: DC | PRN

## 2024-07-09 MED ORDER — ACETAMINOPHEN 500 MG PO TABS: 500.0000 mg | ORAL_TABLET | Freq: Four times a day (QID) | ORAL | Status: DC | PRN

## 2024-07-09 MED ORDER — BUPIVACAINE HCL (PF) 0.5 % IJ SOLN
INTRAMUSCULAR | Status: DC | PRN
Start: 2024-07-09 — End: 2024-07-09
  Administered 2024-07-09: 10 mL via PERINEURAL

## 2024-07-09 MED ORDER — FENTANYL CITRATE (PF) 100 MCG/2ML IJ SOLN
INTRAMUSCULAR | Status: AC
  Administered 2024-07-09: 50 ug
  Filled 2024-07-09: qty 2

## 2024-07-09 MED ORDER — BUPIVACAINE LIPOSOME 1.3 % IJ SUSP
INTRAMUSCULAR | Status: DC | PRN
  Administered 2024-07-09: 10 mL via PERINEURAL

## 2024-07-09 MED ORDER — METOCLOPRAMIDE HCL 5 MG/ML IJ SOLN: 5.0000 mg | Freq: Three times a day (TID) | INTRAMUSCULAR | Status: DC | PRN

## 2024-07-09 MED ORDER — VANCOMYCIN HCL 1000 MG IV SOLR
INTRAVENOUS | Status: AC
  Filled 2024-07-09: qty 20

## 2024-07-09 MED ORDER — STERILE WATER FOR IRRIGATION IR SOLN
Status: DC | PRN
  Administered 2024-07-09: 1000 mL

## 2024-07-09 MED ORDER — LIDOCAINE 2% (20 MG/ML) 5 ML SYRINGE
INTRAMUSCULAR | Status: AC
  Filled 2024-07-09: qty 5

## 2024-07-09 MED ORDER — CHLORHEXIDINE GLUCONATE 0.12 % MT SOLN: 15.0000 mL | Freq: Once | OROMUCOSAL | Status: AC

## 2024-07-09 MED ORDER — FENTANYL CITRATE (PF) 250 MCG/5ML IJ SOLN
INTRAMUSCULAR | Status: DC | PRN
  Administered 2024-07-09: 100 ug via INTRAVENOUS

## 2024-07-09 MED ORDER — FENTANYL CITRATE (PF) 250 MCG/5ML IJ SOLN
INTRAMUSCULAR | Status: AC
  Filled 2024-07-09: qty 5

## 2024-07-09 MED ORDER — PHENOL 1.4 % MT LIQD: 1.0000 | OROMUCOSAL | Status: DC | PRN

## 2024-07-09 MED ORDER — DEXAMETHASONE SODIUM PHOSPHATE 10 MG/ML IJ SOLN
INTRAMUSCULAR | Status: DC | PRN
  Administered 2024-07-09: 10 mg via INTRAVENOUS

## 2024-07-09 MED ORDER — PHENYLEPHRINE 80 MCG/ML (10ML) SYRINGE FOR IV PUSH (FOR BLOOD PRESSURE SUPPORT)
PREFILLED_SYRINGE | INTRAVENOUS | Status: DC | PRN
  Administered 2024-07-09 (×2): 160 ug via INTRAVENOUS

## 2024-07-09 MED ORDER — TRANEXAMIC ACID-NACL 1000-0.7 MG/100ML-% IV SOLN
INTRAVENOUS | Status: DC | PRN
  Administered 2024-07-09: 1000 mg via INTRAVENOUS

## 2024-07-09 MED ORDER — TRANEXAMIC ACID-NACL 1000-0.7 MG/100ML-% IV SOLN
INTRAVENOUS | Status: AC
Start: 2024-07-09 — End: 2024-07-09
  Filled 2024-07-09: qty 100

## 2024-07-09 MED ORDER — ORAL CARE MOUTH RINSE: 15.0000 mL | Freq: Once | OROMUCOSAL | Status: AC

## 2024-07-09 MED ORDER — DEXAMETHASONE SODIUM PHOSPHATE 10 MG/ML IJ SOLN
INTRAMUSCULAR | Status: AC
  Filled 2024-07-09: qty 1

## 2024-07-09 SURGICAL SUPPLY — 66 items
ALCOHOL 70% 16 OZ (MISCELLANEOUS) ×2 IMPLANT
AUGMENT COMP REV MI TAPR ADPTR (Joint) IMPLANT
BAG COUNTER SPONGE SURGICOUNT (BAG) ×2 IMPLANT
BEARING HUMERAL 40 STD VITE (Joint) IMPLANT
BIT DRILL 2.7 W/STOP DISP (BIT) IMPLANT
BIT DRILL TWIST 2.7 (BIT) IMPLANT
BLADE SAW SGTL 13X75X1.27 (BLADE) ×2 IMPLANT
CHLORAPREP W/TINT 26 (MISCELLANEOUS) ×2 IMPLANT
COOLER ICEMAN CLASSIC (MISCELLANEOUS) ×2 IMPLANT
COVER SURGICAL LIGHT HANDLE (MISCELLANEOUS) ×2 IMPLANT
DRAPE INCISE IOBAN 66X45 STRL (DRAPES) ×2 IMPLANT
DRAPE SURG ORHT 6 SPLT 77X108 (DRAPES) ×4 IMPLANT
DRAPE U-SHAPE 47X51 STRL (DRAPES) ×4 IMPLANT
DRSG AQUACEL AG ADV 3.5X10 (GAUZE/BANDAGES/DRESSINGS) ×2 IMPLANT
DRSG AQUACEL AG ADV 3.5X14 (GAUZE/BANDAGES/DRESSINGS) IMPLANT
ELECTRODE BLDE 4.0 EZ CLN MEGD (MISCELLANEOUS) ×2 IMPLANT
ELECTRODE REM PT RTRN 9FT ADLT (ELECTROSURGICAL) ×2 IMPLANT
GAUZE SPONGE 4X4 12PLY STRL LF (GAUZE/BANDAGES/DRESSINGS) ×2 IMPLANT
GLENOSPHERE VERSADIAL 40 +6 (Joint) IMPLANT
GLOVE BIOGEL PI IND STRL 6.5 (GLOVE) ×2 IMPLANT
GLOVE BIOGEL PI IND STRL 8 (GLOVE) ×2 IMPLANT
GLOVE ECLIPSE 6.5 STRL STRAW (GLOVE) ×2 IMPLANT
GLOVE ECLIPSE 8.0 STRL XLNG CF (GLOVE) ×2 IMPLANT
GOWN STRL REUS W/ TWL LRG LVL3 (GOWN DISPOSABLE) ×2 IMPLANT
GOWN STRL REUS W/ TWL XL LVL3 (GOWN DISPOSABLE) ×2 IMPLANT
GUIDE BONE RSA SHLD ROT RT (ORTHOPEDIC DISPOSABLE SUPPLIES) IMPLANT
HYDROGEN PEROXIDE 16OZ (MISCELLANEOUS) ×2 IMPLANT
KIT BASIN OR (CUSTOM PROCEDURE TRAY) ×2 IMPLANT
KIT TURNOVER KIT B (KITS) ×2 IMPLANT
LAVAGE JET IRRISEPT WOUND (IRRIGATION / IRRIGATOR) ×2 IMPLANT
MANIFOLD NEPTUNE II (INSTRUMENTS) ×2 IMPLANT
NDL SUT 6 .5 CRC .975X.05 MAYO (NEEDLE) IMPLANT
NDL TAPERED W/ NITINOL LOOP (MISCELLANEOUS) IMPLANT
NEEDLE TAPERED W/ NITINOL LOOP (MISCELLANEOUS) ×1 IMPLANT
NS IRRIG 1000ML POUR BTL (IV SOLUTION) ×2 IMPLANT
PACK SHOULDER (CUSTOM PROCEDURE TRAY) ×2 IMPLANT
PAD COLD SHLDR WRAP-ON (PAD) ×2 IMPLANT
PIN STEINMANN THREADED TIP (PIN) IMPLANT
PIN THREADED REVERSE (PIN) IMPLANT
REAMER GUIDE BUSHING SURG DISP (MISCELLANEOUS) IMPLANT
REAMER GUIDE W/SCREW AUG (MISCELLANEOUS) IMPLANT
RESTRAINT HEAD UNIVERSAL NS (MISCELLANEOUS) ×2 IMPLANT
RETRIEVER SUT HEWSON (MISCELLANEOUS) ×2 IMPLANT
SCREW BONE LOCKING 4.75X30X3.5 (Screw) IMPLANT
SCREW BONE LOCKING 4.75X35X3.5 (Screw) IMPLANT
SCREW BONE STRL 6.5MMX30MM (Screw) IMPLANT
SCREW LOCKING 4.75MMX15MM (Screw) IMPLANT
SLING ARM IMMOBILIZER LRG (SOFTGOODS) ×2 IMPLANT
SOL PREP POV-IOD 4OZ 10% (MISCELLANEOUS) ×2 IMPLANT
SPONGE T-LAP 18X18 ~~LOC~~+RFID (SPONGE) ×2 IMPLANT
STEM HUMERAL STRL 12MMX83MM (Stem) IMPLANT
STRIP CLOSURE SKIN 1/2X4 (GAUZE/BANDAGES/DRESSINGS) ×2 IMPLANT
SUCTION TUBE FRAZIER 10FR DISP (SUCTIONS) ×2 IMPLANT
SUT BROADBAND TAPE 2PK 1.5 (SUTURE) IMPLANT
SUT ETHILON 3 0 PS 1 (SUTURE) IMPLANT
SUT MNCRL AB 3-0 PS2 18 (SUTURE) ×2 IMPLANT
SUT SILK 2 0 TIES 10X30 (SUTURE) ×2 IMPLANT
SUT VIC AB 0 CT1 27XBRD ANBCTR (SUTURE) ×8 IMPLANT
SUT VIC AB 1 CT1 27XBRD ANBCTR (SUTURE) ×4 IMPLANT
SUT VIC AB 2-0 CT1 TAPERPNT 27 (SUTURE) IMPLANT
SUT VIC AB 2-0 CT2 27 (SUTURE) ×6 IMPLANT
SUT VICRYL 0 UR6 27IN ABS (SUTURE) ×4 IMPLANT
TOWEL GREEN STERILE (TOWEL DISPOSABLE) ×2 IMPLANT
TRAY HUMERAL STD +10 (Shoulder) IMPLANT
WATER STERILE IRR 1000ML POUR (IV SOLUTION) ×2 IMPLANT
YANKAUER SUCT BULB TIP NO VENT (SUCTIONS) IMPLANT

## 2024-07-09 NOTE — Op Note (Unsigned)
 NAME: Christian [name], Christian [name] MEDICAL RECORD NO: [id number] ACCOUNT NO: [id number] DATE OF BIRTH: 05-30-1951 FACILITY: [facility] LOCATION: [facility] PHYSICIAN: Cordella RAMAN. 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  PROCEDURE: Right reverse shoulder replacement using Biomet Comprehensive Shoulder System size 12 mini humeral stem with mini humeral tray +10 thickness +0 taper offset 40 mm diameter with highly cross-linked standard polyethylene bearing and small  augmented baseplate with one central compression screw, 4 peripheral locking screws and glenosphere 40 mm +6 offset.  SURGEON: Cordella RAMAN. Addie, MD.  ASSISTANT: Magdalene Fireman, RNFA.  INDICATIONS: The patient is a 73 year old with severe end-stage right shoulder arthritis who presents for operative management after explanation of risks and benefits.  DESCRIPTION OF PROCEDURE: The patient was brought to the operating room where general endotracheal anesthesia was used. Preoperative antibiotics were administered. Timeout was called. The patient was placed in the beach chair position with the head in  neutral position. The right shoulder, arm, and hand were prescrubbed with hydrogen peroxide followed by alcohol and then Betadine  was allowed to air dry, then prepped with ChloraPrep solution and draped in a sterile manner. Ioban was used to seal the  operative field, cover the axilla, and then cover the operative field. After calling timeout, a deltopectoral approach was made. Skin and subcutaneous tissues were sharply divided. 1 cm of the pec was released. The patient had significant capsular  distention from his rotator cuff arthropathy. Also, the patient had avulsed out the short head of his biceps from the coracoid. The subdeltoid and subacromial adhesions were released manually.  Deltoid elevated off its anterior attachment manually to  decrease some of the tension on the deltoid. Kolbel retractor was carefully placed, essentially locking onto the coracoid. Next, the patient essentially had torn all of his rotator cuff tendons including the supraspinatus, infraspinatus, subscapularis,  as well as part of the teres. The capsule was then detached using Cobb elevator and a sponge about 2 cm inferior on the humeral neck down to the 5 o'clock position. Next, the head was dislocated. Severe arthritis was present. Reaming was performed up to  a size 12. The head was then cut in 30 degrees of retroversion. A smaller head cut was made because of the patient's significant preoperative laxity. Next, broaching was performed up to a size 12 and a cap was placed. Anterior and posterior retractors  were then placed with muscle relaxer reversal. The labrum was excised circumferentially. Care was taken to avoid injury to the axillary nerve, which was palpated at the beginning of the case as well as at the conclusion of the case. The patient specific  guides were placed. Reaming was performed. The patient had a very nice fit with the small augmented baseplate. The baseplate was placed and 1 central compression screw and 4 peripheral locking screws were placed with very good purchase obtained. Next, we  did trial reduction with a 40 mm +6 glenosphere. This was chosen to increase stability. Next, we did multiple trials on the humeral side and the most stable construct was the +0 humeral tray with 10 mm thickness humeral tray which was +0 offset with a  standard thickness liner. This gave very good stability to extension, adduction, and a forward force along with internal and external rotation. Difficult to reduce, difficult to dislocate 2 fingers tight. Trial components were removed on the humeral  side. Some of the soft tissue capsule and pseudocapsule was maintained for later closure back to the  anterior portion of the humerus. Drill holes were placed for that repair with 4 SutureTapes passed. This was not really the subscapularis at all. It was  more redundant capsule, which had formed around the humerus. Next, the glenosphere was placed onto the dried [facility] taper. Good fit was obtained. We then placed in the humeral stem into a canal, which had been irrigated with Irrisept solution, suctioned  out, and then vancomycin  powder was placed into that canal. Very good fit on the humeral stem was achieved. Did repeat reduction with the +0 offset tray with 10 mm thickness and that gave a very nice stable reduction. True component was placed. Thorough  irrigation was performed with 3 liters of irrigating solution. Arm was taken through range of motion and found to have very good range of motion. After irrigating, we irrigated the incision with Irrisept, then placed vancomycin  powder onto the implants.  We also closed that capsule back using the SutureTapes and NICE knots. At this time, the deltopectoral incision was closed using #1 Vicryl suture followed by interrupted inverted, 0 Vicryl suture, 2-0 Vicryl suture, and 3-0 Monocryl with Steri-Strips and  Aquacel dressing and large shoulder immobilizer placed. The patient tolerated the procedure well without immediate complications and transferred to the recovery room in stable condition.     SUJ D: 07/09/2024 4:10:22 pm T: 07/09/2024 11:41:00 pm  JOB: [id number]/ [id number]

## 2024-07-09 NOTE — Congregational Nurse Program (Signed)
 Dr. Georgina called back and ordered 3 units of insulin . Will administer and continue to monitor.

## 2024-07-09 NOTE — Plan of Care (Signed)
  Problem: Education: Goal: Knowledge of General Education information will improve Description: Including pain rating scale, medication(s)/side effects and non-pharmacologic comfort measures Outcome: Progressing   Problem: Health Behavior/Discharge Planning: Goal: Ability to manage health-related needs will improve Outcome: Progressing   Problem: Clinical Measurements: Goal: Ability to maintain clinical measurements within normal limits will improve Outcome: Progressing Goal: Will remain free from infection Outcome: Progressing Goal: Diagnostic test results will improve Outcome: Progressing Goal: Respiratory complications will improve Outcome: Progressing Goal: Cardiovascular complication will be avoided Outcome: Progressing   Problem: Activity: Goal: Risk for activity intolerance will decrease Outcome: Progressing   Problem: Nutrition: Goal: Adequate nutrition will be maintained Outcome: Progressing   Problem: Coping: Goal: Level of anxiety will decrease Outcome: Progressing   Problem: Elimination: Goal: Will not experience complications related to bowel motility Outcome: Progressing Goal: Will not experience complications related to urinary retention Outcome: Progressing   Problem: Pain Managment: Goal: General experience of comfort will improve and/or be controlled Outcome: Progressing   Problem: Safety: Goal: Ability to remain free from injury will improve Outcome: Progressing   Problem: Skin Integrity: Goal: Risk for impaired skin integrity will decrease Outcome: Progressing   Problem: Education: Goal: Ability to describe self-care measures that may prevent or decrease complications (Diabetes Survival Skills Education) will improve Outcome: Progressing Goal: Individualized Educational Video(s) Outcome: Progressing   Problem: Coping: Goal: Ability to adjust to condition or change in health will improve Outcome: Progressing   Problem: Fluid  Volume: Goal: Ability to maintain a balanced intake and output will improve Outcome: Progressing   Problem: Health Behavior/Discharge Planning: Goal: Ability to identify and utilize available resources and services will improve Outcome: Progressing Goal: Ability to manage health-related needs will improve Outcome: Progressing   Problem: Metabolic: Goal: Ability to maintain appropriate glucose levels will improve Outcome: Progressing   Problem: Nutritional: Goal: Maintenance of adequate nutrition will improve Outcome: Progressing Goal: Progress toward achieving an optimal weight will improve Outcome: Progressing   Problem: Skin Integrity: Goal: Risk for impaired skin integrity will decrease Outcome: Progressing   Problem: Tissue Perfusion: Goal: Adequacy of tissue perfusion will improve Outcome: Progressing   Problem: Education: Goal: Knowledge of the prescribed therapeutic regimen will improve Outcome: Progressing Goal: Understanding of activity limitations/precautions following surgery will improve Outcome: Progressing Goal: Individualized Educational Video(s) Outcome: Progressing   Problem: Activity: Goal: Ability to tolerate increased activity will improve Outcome: Progressing   Problem: Pain Management: Goal: Pain level will decrease with appropriate interventions Outcome: Progressing

## 2024-07-09 NOTE — Progress Notes (Signed)
 The patient CBG is 321. Called the on call provider and awaiting for a call back. Will continue to monitor.

## 2024-07-09 NOTE — Brief Op Note (Signed)
   07/09/2024  4:02 PM  PATIENT:  Zachary FORBES Helena  73 y.o. male  PRE-OPERATIVE DIAGNOSIS:  right shoulder osteoarthritis  POST-OPERATIVE DIAGNOSIS:  right shoulder osteoarthritis  PROCEDURE:  Procedure(s): ARTHROPLASTY, SHOULDER, TOTAL, REVERSE  SURGEON:  Surgeon(s): Addie, Cordella Hamilton, MD  ASSISTANT: dixon rnfa  ANESTHESIA:   general  EBL: 50 ml    Total I/O In: 1350 [I.V.:1000; IV Piggyback:350] Out: 100 [Blood:100]  BLOOD ADMINISTERED: none  DRAINS: none   LOCAL MEDICATIONS USED:  none  SPECIMEN:  No Specimen  COUNTS:  YES  TOURNIQUET:  * No tourniquets in log *  DICTATION: .Other Dictation: Dictation Number [id number]  PLAN OF CARE: Admit for overnight observation  PATIENT DISPOSITION:  PACU - hemodynamically stable

## 2024-07-09 NOTE — H&P (Signed)
 Christian [name] is an 73 y.o. male.   Chief Complaint: Right shoulder pain HPI:  Christian [name] is a 73 y.o. male who presents  right shoulder pain.  He was on the schedule last year but he actually fell and required an extensive back surgery.  He has done well with that back surgery.  Had a fusion done.  Now he reports continued right shoulder pain with very significant limitation of functional activity.  Really hard for him to eat.  Does have a history of insulin -dependent diabetes with hemoglobin A1c 6.0.  Using a walker and a cane.  He states his right arm is fairly useless.  Prior CT scan last year does show rotator cuff arthropathy as well as significant rotator cuff atrophy and glenohumeral arthritis .  Shoulder located on prior imaging studies most recently performed.  Past Medical History:  Diagnosis Date   Arthritis of left knee    Chronic back pain 09/13/2013   Chronic pain syndrome 09/13/2013   Diabetic ulcer of right ankle (HCC)    GERD (gastroesophageal reflux disease)    hx. of   Gout 09/13/2013   Hemorrhoids    Hypertension    Morbid obesity with body mass index of 45.0-49.9 in adult [facility]) 09/13/2013   Osteoarthritis 09/13/2013   Pneumonia    hx. of   Seizures (HCC)    Sleep apnea    has never used C-pap machine due to insurance cost   Type 1 diabetes mellitus, uncontrolled     Past Surgical History:  Procedure Laterality Date   broken toe 35 years ago     COLONOSCOPY WITH PROPOFOL  N/A 12/15/2022   Procedure: COLONOSCOPY WITH PROPOFOL ;  Surgeon: Therisa Bi, MD;  Location: [facility] ENDOSCOPY;  Service: Gastroenterology;  Laterality: N/A;   IR EMBO ARTERIAL NOT HEMORR HEMANG INC GUIDE ROADMAPPING  04/27/2023   IR RADIOLOGIST EVAL & MGMT  03/29/2023   IR RADIOLOGIST EVAL & MGMT  08/02/2023   TOTAL KNEE ARTHROPLASTY Right 05/01/2015   Procedure: RIGHT TOTAL KNEE ARTHROPLASTY;  Surgeon: Lonni CINDERELLA Poli, MD;  Location: [facility] ORS;  Service: Orthopedics;  Laterality:  Right;   WISDOM TOOTH EXTRACTION      Family History  Problem Relation Age of Onset   Alcohol abuse Father    Arthritis Maternal Grandmother    Alcohol abuse Paternal Grandmother    Alcohol abuse Paternal Grandfather    Diabetes Maternal Aunt    Social History:  reports that he quit smoking about 20 years ago. His smoking use included cigarettes. He started smoking about 62 years ago. He has a 42.2 pack-year smoking history. He has never used smokeless tobacco. He reports that he does not drink alcohol and does not use drugs.  Allergies:  Allergies  Allergen Reactions   Jardiance  [Empagliflozin ]     Yeast infections   Metoprolol  Other (See Comments)    Caused blood sugars to be elevated and not controlled   Statins     Muscle aches, joint pain   Penicillins Rash    Medications Prior to Admission  Medication Sig Dispense Refill   acetaminophen  (TYLENOL ) 500 MG tablet Take 500-1,000 mg by mouth every 6 (six) hours as needed (pain.).     aspirin  EC 81 MG tablet Take 81 mg by mouth in the morning.     diclofenac  (VOLTAREN ) 75 MG EC tablet Take 75 mg by mouth 2 (two) times daily.     insulin  regular human CONCENTRATED (HUMULIN  R U-500 KWIKPEN) 500 UNIT/ML  KwikPen INJECT 200 UNITS UNDER SKIN A DAY AS ADVISED (Patient taking differently: Inject 80 Units into the skin in the morning. Inject 200 units under skin a day as advised) 24 mL 0   metFORMIN  (GLUCOPHAGE ) 1000 MG tablet Take 2 tablets (2,000 mg total) by mouth daily with breakfast.     Multiple Vitamin (MULTIVITAMIN WITH MINERALS) TABS tablet Take 1 tablet by mouth daily with lunch. (Patient taking differently: Take 1 tablet by mouth in the morning.)     oxyCODONE  (ROXICODONE ) 5 MG immediate release tablet Take 1 tablet (5 mg total) by mouth every 8 (eight) hours as needed. 12 tablet 0   traMADol  (ULTRAM ) 50 MG tablet Take 1 tablet (50 mg total) by mouth every 12 (twelve) hours as needed (moderate pain level 4-6). 30 tablet 0   BD  PEN NEEDLE NANO 2ND GEN 32G X 4 MM MISC USE AS DIRECTED 5 TIMES A DAY WITH INSULIN  INJECTIONS 200 each 5   Continuous Glucose Sensor (FREESTYLE LIBRE 3 SENSOR) MISC by Does not apply route. Use to check glucose continuously change every 14 days     valsartan  (DIOVAN ) 40 MG tablet Take 1 tablet (40 mg total) by mouth daily. (Patient not taking: Reported on 07/02/2024) 90 tablet 3    Results for orders placed or performed during the hospital encounter of 07/09/24 (from the past 48 hours)  Glucose, capillary     Status: Abnormal   Collection Time: 07/09/24  9:50 AM  Result Value Ref Range   Glucose-Capillary 142 (H) 70 - 99 mg/dL    Comment: Glucose reference range applies only to samples taken after fasting for at least 8 hours.   Comment 1 Notify RN    No results found.  Review of Systems  Musculoskeletal:  Positive for arthralgias.  All other systems reviewed and are negative.   Blood pressure (!) 142/98, pulse 97, temperature 98.2 F (36.8 C), resp. rate 20, height 5' 6 (1.676 m), weight 131.1 kg, SpO2 95%. Physical Exam Vitals reviewed.  HENT:     Head: Normocephalic.     Nose: Nose normal.     Mouth/Throat:     Mouth: Mucous membranes are moist.  Eyes:     Pupils: Pupils are equal, round, and reactive to light.  Cardiovascular:     Rate and Rhythm: Normal rate.     Pulses: Normal pulses.  Pulmonary:     Effort: Pulmonary effort is normal.  Abdominal:     General: Abdomen is flat.  Musculoskeletal:     Cervical back: Normal range of motion.  Skin:    General: Skin is warm.     Capillary Refill: Capillary refill takes less than 2 seconds.  Neurological:     General: No focal deficit present.     Mental Status: He is alert.  Psychiatric:        Mood and Affect: Mood normal.     Ortho exam demonstrates range of motion of 60/95/150 passively but active forward flexion and abduction both below 70 degrees.  Deltoid does fire.  Motor or sensory function to the hand is  intact.  Radial pulses intact.   Assessment/Plan Impression is right shoulder rotator cuff arthropathy. Doing reasonably well from his back surgery. Uses a walker to get up and around. Currently has forward flexion abduction both well below 70 degrees. Plan at this time is reverse shoulder replacement.  CT scan done  for patient specific instrumentation and guidance for optimal implant position to increase longevity  and function. The risk and benefits of reverse shoulder replacement are discussed with the patient including but not limited to infection nerve or vessel damage incomplete pain relief as well as incomplete return of function and instability. Rehab process discussed. . All questions answered.   KANDICE Glendia Hutchinson, MD 07/09/2024, 11:08 AM

## 2024-07-09 NOTE — Anesthesia Procedure Notes (Signed)
 Anesthesia Regional Block: Interscalene brachial plexus block   Pre-Anesthetic Checklist: , timeout performed,  Correct Patient, Correct Site, Correct Laterality,  Correct Procedure, Correct Position, site marked,  Risks and benefits discussed,  Surgical consent,  Pre-op evaluation,  At surgeon's request and post-op pain management  Laterality: Upper and Right  Prep: chloraprep       Needles:  Injection technique: Single-shot  Needle Type: Stimulator Needle - 40     Needle Length: 4cm  Needle Gauge: 22     Additional Needles:   Procedures:,,,, ultrasound used (permanent image in chart),,    Narrative:  Start time: 07/09/2024 11:04 AM End time: 07/09/2024 11:24 AM Injection made incrementally with aspirations every 5 mL.  Performed by: Personally  Anesthesiologist: Darlyn Rush, MD  Additional Notes: BP cuff, SpO2 and EKG monitors applied. Sedation begun. Nerve location verified with ultrasound. Anesthetic injected incrementally, slowly, and after neg aspirations under direct u/s guidance. Good perineural spread. Tolerated well.

## 2024-07-09 NOTE — Transfer of Care (Signed)
 Immediate Anesthesia Transfer of Care Note  Patient: Christian [name]  Procedure(s) Performed: ARTHROPLASTY, SHOULDER, TOTAL, REVERSE (Right: Shoulder)  Patient Location: PACU  Anesthesia Type:GA combined with regional for post-op pain  Level of Consciousness: drowsy  Airway & Oxygen Therapy: Patient Spontanous Breathing and Patient connected to face mask oxygen  Post-op Assessment: Report given to RN and Post -op Vital signs reviewed and stable  Post vital signs: Reviewed and stable  Last Vitals:  Vitals Value Taken Time  BP 131/72 07/09/24 15:45  Temp    Pulse 107 07/09/24 15:46  Resp 17 07/09/24 15:46  SpO2 96 % 07/09/24 15:46  Vitals shown include unfiled device data.  Last Pain:  Vitals:   07/09/24 0951  PainSc: 6          Complications: No notable events documented.

## 2024-07-09 NOTE — Anesthesia Procedure Notes (Signed)
 Procedure Name: Intubation Date/Time: 07/09/2024 12:06 PM  Performed by: Julien Manus, CRNAPre-anesthesia Checklist: Patient identified, Emergency Drugs available, Suction available and Patient being monitored Patient Re-evaluated:Patient Re-evaluated prior to induction Oxygen Delivery Method: Circle System Utilized Preoxygenation: Pre-oxygenation with 100% oxygen Induction Type: IV induction Ventilation: Mask ventilation without difficulty Laryngoscope Size: Glidescope and 4 Grade View: Grade I Tube type: Oral Tube size: 7.5 mm Number of attempts: 1 Airway Equipment and Method: Stylet and Oral airway Placement Confirmation: ETT inserted through vocal cords under direct vision, positive ETCO2 and breath sounds checked- equal and bilateral Secured at: 22 cm Tube secured with: Tape Dental Injury: Teeth and Oropharynx as per pre-operative assessment

## 2024-07-10 ENCOUNTER — Ambulatory Visit: Admitting: Internal Medicine

## 2024-07-10 ENCOUNTER — Encounter (HOSPITAL_COMMUNITY): Payer: Self-pay | Admitting: Orthopedic Surgery

## 2024-07-10 DIAGNOSIS — Z7984 Long term (current) use of oral hypoglycemic drugs: Secondary | ICD-10-CM | POA: Diagnosis not present

## 2024-07-10 DIAGNOSIS — M19011 Primary osteoarthritis, right shoulder: Secondary | ICD-10-CM | POA: Diagnosis not present

## 2024-07-10 DIAGNOSIS — L97419 Non-pressure chronic ulcer of right heel and midfoot with unspecified severity: Secondary | ICD-10-CM | POA: Diagnosis not present

## 2024-07-10 DIAGNOSIS — Z96651 Presence of right artificial knee joint: Secondary | ICD-10-CM | POA: Diagnosis not present

## 2024-07-10 DIAGNOSIS — E11621 Type 2 diabetes mellitus with foot ulcer: Secondary | ICD-10-CM | POA: Diagnosis not present

## 2024-07-10 DIAGNOSIS — Z7982 Long term (current) use of aspirin: Secondary | ICD-10-CM | POA: Diagnosis not present

## 2024-07-10 LAB — GLUCOSE, CAPILLARY
Glucose-Capillary: 321 mg/dL — ABNORMAL HIGH (ref 70–99)
Glucose-Capillary: 207 mg/dL — ABNORMAL HIGH (ref 70–99)
Glucose-Capillary: 116 mg/dL — ABNORMAL HIGH (ref 70–99)
Glucose-Capillary: 124 mg/dL — ABNORMAL HIGH (ref 70–99)
Glucose-Capillary: 138 mg/dL — ABNORMAL HIGH (ref 70–99)

## 2024-07-10 NOTE — Plan of Care (Signed)
  Problem: Education: Goal: Knowledge of General Education information will improve Description: Including pain rating scale, medication(s)/side effects and non-pharmacologic comfort measures Outcome: Progressing   Problem: Clinical Measurements: Goal: Cardiovascular complication will be avoided Outcome: Progressing   Problem: Elimination: Goal: Will not experience complications related to urinary retention Outcome: Progressing   Problem: Clinical Measurements: Goal: Respiratory complications will improve Outcome: Not Progressing   Problem: Skin Integrity: Goal: Risk for impaired skin integrity will decrease Outcome: Not Progressing

## 2024-07-10 NOTE — Care Management Obs Status (Signed)
 MEDICARE OBSERVATION STATUS NOTIFICATION   Patient Details  Name: JOSHUS ROGAN MRN: [id number] Date of Birth: 07/14/1951   Medicare Observation Status Notification Given:       Vonzell Arrie Sharps 07/10/2024, 1:40 PM

## 2024-07-10 NOTE — Anesthesia Postprocedure Evaluation (Signed)
 Anesthesia Post Note  Patient: Christian [name]  Procedure(s) Performed: ARTHROPLASTY, SHOULDER, TOTAL, REVERSE (Right: Shoulder)     Patient location during evaluation: PACU Anesthesia Type: General Level of consciousness: awake and alert Pain management: pain level controlled Vital Signs Assessment: post-procedure vital signs reviewed and stable Respiratory status: spontaneous breathing, nonlabored ventilation and respiratory function stable Cardiovascular status: blood pressure returned to baseline Postop Assessment: no apparent nausea or vomiting Anesthetic complications: no   No notable events documented.            Vertell Row

## 2024-07-10 NOTE — Progress Notes (Signed)
  Subjective: Patient stable.  Block has not quite worn off yet.   Objective: Vital signs in last 24 hours: Temp:  [97.6 F (36.4 C)-98.2 F (36.8 C)] 98.2 F (36.8 C) (09/03 0423) Pulse Rate:  [65-116] 104 (09/03 0423) Resp:  [15-20] 16 (09/03 0423) BP: (98-167)/(55-110) 122/69 (09/03 0423) SpO2:  [2 %-98 %] (P) 2 % (09/03 0639) Weight:  [131.1 kg] 131.1 kg (09/02 0936)  Intake/Output from previous day: 09/02 0701 - 09/03 0700 In: 2313.3 [P.O.:340; I.V.:1423.3; IV Piggyback:550] Out: 1200 [Urine:1100; Blood:100] Intake/Output this shift: Total I/O In: 863.3 [P.O.:240; I.V.:423.3; IV Piggyback:200] Out: 1100 [Urine:1100]  Exam:  No cellulitis present Compartment soft  Labs: No results for input(s): HGB in the last 72 hours. No results for input(s): WBC, RBC, HCT, PLT in the last 72 hours. No results for input(s): NA, K, CL, CO2, BUN, CREATININE, GLUCOSE, CALCIUM in the last 72 hours. No results for input(s): LABPT, INR in the last 72 hours.  Assessment/Plan: Plan at this time is mobilization with physical therapy and initiate shoulder range of motion exercises with occupational therapy.  He has a machine at home which he is going to bring that he is okay to use for shoulder passive range of motion.  Will see how he does mobilization was in terms of discharge.  Would prefer that he not weight-bear through that right arm for the first couple of weeks.   KANDICE Hamilton Janis Cuffe 07/10/2024, 6:48 AM

## 2024-07-10 NOTE — Evaluation (Signed)
 Occupational Therapy Evaluation Patient Details Name: Christian [name] MRN: [id number] DOB: May 08, 1951 Today's Date: 07/10/2024   History of Present Illness   Pt is a 73 y/o male admitted for R reverse shoulder replacement in setting of rotator cuff tear and arthritis. PMH: L knee OA, GERD, HTN, obesity, gout, seizures, DM1, hx of back surgery April 2025     Clinical Impressions PTA, pt lives with spouse, typically ambulatory with a cane and receiving light assist for ADLs from spouse d/t impaired R UE function. Pt presents now with nerve block still in effect (RUE numb) and minor deficits in dynamic standing balance and endurance. Educated re: allowed exercises for RUE (PROM/pendulums for R shoulder), sling mgmt, iceman use, strategies for ADLs and where family may need to assist. Pt requires Mod A for UB ADL, Min A for LB ADLs and CGA for transfers this AM. Anticipate pt able to return home at DC with family support. Recommend therapy recs per surgeon's shoulder protocol.      If plan is discharge home, recommend the following:   A lot of help with bathing/dressing/bathroom;Assistance with cooking/housework;Assist for transportation     Functional Status Assessment   Patient has had a recent decline in their functional status and demonstrates the ability to make significant improvements in function in a reasonable and predictable amount of time.     Equipment Recommendations   None recommended by OT     Recommendations for Other Services         Precautions/Restrictions   Precautions Precautions: Fall;Other (comment);Shoulder Type of Shoulder Precautions: no AROM of shoulder. ok for PROM shoulder flex 0-90*, abduction 0-60* and external rotation 0-30* Shoulder Interventions: Shoulder sling/immobilizer;Off for dressing/bathing/exercises Precaution Booklet Issued: Yes (comment) Recall of Precautions/Restrictions: Impaired Required Braces or Orthoses:  Sling Restrictions Weight Bearing Restrictions Per Provider Order: Yes RUE Weight Bearing Per Provider Order: Non weight bearing     Mobility Bed Mobility Overal bed mobility: Needs Assistance Bed Mobility: Supine to Sit     Supine to sit: Supervision     General bed mobility comments: cues for RUE protection d/t ill fitting sling at start of session    Transfers Overall transfer level: Needs assistance Equipment used: None Transfers: Sit to/from Stand, Bed to chair/wheelchair/BSC Sit to Stand: Contact guard assist     Step pivot transfers: Contact guard assist     General transfer comment: assist to protect/support RUE in standing with ADL completion. able to step to chair at end of session for breakfast      Balance Overall balance assessment: Needs assistance Sitting-balance support: No upper extremity supported, Feet supported Sitting balance-Leahy Scale: Good     Standing balance support: No upper extremity supported, During functional activity Standing balance-Leahy Scale: Fair                             ADL either performed or assessed with clinical judgement   ADL Overall ADL's : Needs assistance/impaired Eating/Feeding: Set up;Sitting   Grooming: Set up;Sitting   Upper Body Bathing: Sitting;Moderate assistance   Lower Body Bathing: Minimal assistance;Sitting/lateral leans;Sit to/from stand   Upper Body Dressing : Moderate assistance;Sitting Upper Body Dressing Details (indicate cue type and reason): assist to don shirt up operative arm first and assist to pull down back Lower Body Dressing: Minimal assistance;Sitting/lateral leans;Sit to/from stand Lower Body Dressing Details (indicate cue type and reason): assist to pull up underwear and pants on R side due  to limited reach with LUE Toilet Transfer: Contact guard assist;Stand-pivot;BSC/3in1   Toileting- Clothing Manipulation and Hygiene: Minimal assistance;Sitting/lateral lean;Sit  to/from stand               Vision Baseline Vision/History: 1 Wears glasses Ability to See in Adequate Light: 0 Adequate Patient Visual Report: No change from baseline Vision Assessment?: No apparent visual deficits     Perception         Praxis         Pertinent Vitals/Pain Pain Assessment Pain Assessment: No/denies pain     Extremity/Trunk Assessment Upper Extremity Assessment Upper Extremity Assessment: Left hand dominant;Right hand dominant;RUE deficits/detail;LUE deficits/detail RUE Deficits / Details: post op. nerve block in effect, able to move hand/wrist, some elbow movement. numb throughout RUE Coordination: decreased gross motor LUE Deficits / Details: hx of rotator cuff tear > 40 years ago- some shoulder flexion impairments but able to use this UE functionally, difficulty reaching over to R side for ADLs LUE Coordination: decreased gross motor   Lower Extremity Assessment Lower Extremity Assessment: Defer to PT evaluation   Cervical / Trunk Assessment Cervical / Trunk Assessment: Normal   Communication Communication Communication: No apparent difficulties   Cognition Arousal: Alert Behavior During Therapy: WFL for tasks assessed/performed Cognition: No family/caregiver present to determine baseline             OT - Cognition Comments: tangential, frequent cues to avoid AROM of shoulder                 Following commands: Intact       Cueing  General Comments   Cueing Techniques: Verbal cues      Exercises Exercises: Shoulder Shoulder Exercises Pendulum Exercise: Right, 5 reps, Seated Shoulder Flexion: Right, PROM, 5 reps, Seated (difficulty d/t nerve block still in effect) Shoulder ABduction: Right, PROM, 5 reps, Seated Shoulder External Rotation: Right, 5 reps, PROM, Seated   Shoulder Instructions Shoulder Instructions Donning/doffing shirt without moving shoulder: Moderate assistance Method for sponge bathing under operated  UE: Minimal assistance Donning/doffing sling/immobilizer: Moderate assistance Correct positioning of sling/immobilizer: Minimal assistance Pendulum exercises (written home exercise program): Minimal assistance ROM for elbow, wrist and digits of operated UE: Supervision/safety Sling wearing schedule (on at all times/off for ADL's): Supervision/safety Proper positioning of operated UE when showering: Supervision/safety    Home Living Family/patient expects to be discharged to:: Private residence Living Arrangements: Spouse/significant other Available Help at Discharge: Family;Available 24 hours/day Type of Home: House Home Access: Stairs to enter [employer] of Steps: 4 Entrance Stairs-Rails: Right;Left Home Layout: One level     Bathroom Shower/Tub: Chief Strategy Officer: Standard Bathroom Accessibility: No   Home Equipment: Shower seat;Cane - single point          Prior Functioning/Environment Prior Level of Function : Independent/Modified Independent             Mobility Comments: Independent ADLs Comments: independent    OT Problem List: Decreased activity tolerance;Impaired balance (sitting and/or standing);Decreased knowledge of precautions;Impaired UE functional use;Decreased coordination;Decreased strength   OT Treatment/Interventions: Self-care/ADL training;Therapeutic exercise;Energy conservation;DME and/or AE instruction;Therapeutic activities;Patient/family education;Balance training      OT Goals(Current goals can be found in the care plan section)   Acute Rehab OT Goals Patient Stated Goal: recover well, ensure adequate transportation assist at DC OT Goal Formulation: With patient Time For Goal Achievement: 07/24/24 Potential to Achieve Goals: Good ADL Goals Pt Will Perform Grooming: with modified independence;standing Pt Will Perform Upper Body  Dressing: with supervision;sitting;with adaptive equipment Pt Will Perform Lower  Body Dressing: with supervision;sit to/from stand;sitting/lateral leans Pt Will Transfer to Toilet: with modified independence;ambulating Additional ADL Goal #1: Pt to manage sling with Modified Independence   OT Frequency:  Min 2X/week    Co-evaluation              AM-PAC OT 6 Clicks Daily Activity     Outcome Measure Help from another person eating meals?: A Little Help from another person taking care of personal grooming?: A Little Help from another person toileting, which includes using toliet, bedpan, or urinal?: A Little Help from another person bathing (including washing, rinsing, drying)?: A Lot Help from another person to put on and taking off regular upper body clothing?: A Lot Help from another person to put on and taking off regular lower body clothing?: A Lot 6 Click Score: 15   End of Session Equipment Utilized During Treatment: Other (comment) (sling) Nurse Communication: Mobility status  Activity Tolerance: Patient tolerated treatment well Patient left: in chair;with call bell/phone within reach  OT Visit Diagnosis: Unsteadiness on feet (R26.81);Muscle weakness (generalized) (M62.81)                Time: 9265-9191 OT Time Calculation (min): 34 min Charges:  OT General Charges $OT Visit: 1 Visit OT Evaluation $OT Eval Low Complexity: 1 Low OT Treatments $Self Care/Home Management : 8-22 mins  Mliss NOVAK, OTR/L Acute Rehab Services Office: [phone]   Mliss Fish 07/10/2024, 8:46 AM

## 2024-07-10 NOTE — Significant Event (Signed)
 The patient requested to have the bed alarms turned off. RN/NT offered assistance with getting out of bed (OOB) to the bathroom (BR). The patient displayed mocking behavior toward staff when we reiterated the importance of preventing falls, especially with multiple devices attached. The patient stated that he does not need help and asked to be left alone and said turn that thing off.  CN RN aware.

## 2024-07-11 DIAGNOSIS — Z7982 Long term (current) use of aspirin: Secondary | ICD-10-CM | POA: Diagnosis not present

## 2024-07-11 DIAGNOSIS — Z7984 Long term (current) use of oral hypoglycemic drugs: Secondary | ICD-10-CM | POA: Diagnosis not present

## 2024-07-11 DIAGNOSIS — L97419 Non-pressure chronic ulcer of right heel and midfoot with unspecified severity: Secondary | ICD-10-CM | POA: Diagnosis not present

## 2024-07-11 DIAGNOSIS — E11621 Type 2 diabetes mellitus with foot ulcer: Secondary | ICD-10-CM | POA: Diagnosis not present

## 2024-07-11 DIAGNOSIS — Z96651 Presence of right artificial knee joint: Secondary | ICD-10-CM | POA: Diagnosis not present

## 2024-07-11 DIAGNOSIS — M19011 Primary osteoarthritis, right shoulder: Secondary | ICD-10-CM | POA: Diagnosis not present

## 2024-07-11 LAB — GLUCOSE, CAPILLARY
Glucose-Capillary: 145 mg/dL — ABNORMAL HIGH (ref 70–99)
Glucose-Capillary: 147 mg/dL — ABNORMAL HIGH (ref 70–99)
Glucose-Capillary: 142 mg/dL — ABNORMAL HIGH (ref 70–99)
Glucose-Capillary: 73 mg/dL (ref 70–99)
Glucose-Capillary: 159 mg/dL — ABNORMAL HIGH (ref 70–99)

## 2024-07-11 NOTE — Evaluation (Signed)
 Physical Therapy Evaluation Patient Details Name: Christian [name] MRN: [id number] DOB: April 29, 1951 Today's Date: 07/11/2024  History of Present Illness  Pt is a 73 y/o male admitted for R reverse shoulder replacement in setting of rotator cuff tear and arthritis. PMH: L knee OA, GERD, HTN, obesity, gout, seizures, DM1, hx of back surgery April 2025  Clinical Impression  Pt admitted with above diagnosis. PTA, pt was modI for functional mobility using a cane and independent with ADLs/IADLs. He lives with his wife in a one story house with 4 STE. Pt currently with functional limitations due to the deficits listed below (see PT Problem List). He performed bed mobility and transfers with close supervision, gait using cane with CGA, and stairs using handrail with CGA. Pt ambulated ~33ft with a step-to gait pattern and WBOS. Distance limited d/t left knee OA, back pain, and decreased activity tolerance. Pt ascended/descended 3 steps twice using a step-to pattern and unilateral UE support. Pt will benefit from acute skilled PT to increase his independence and safety with mobility to allow discharge. Recommend HHPT to increase ROM/strength, improve balance, decrease fall risk, and optimize safety within the home environment.      If plan is discharge home, recommend the following: A little help with walking and/or transfers;A little help with bathing/dressing/bathroom;Assistance with cooking/housework;Assist for transportation;Help with stairs or ramp for entrance   Can travel by private vehicle        Equipment Recommendations None recommended by PT  Recommendations for Other Services       Functional Status Assessment Patient has had a recent decline in their functional status and demonstrates the ability to make significant improvements in function in a reasonable and predictable amount of time.     Precautions / Restrictions Precautions Precautions: Fall;Other (comment);Shoulder Type of  Shoulder Precautions: no AROM of shoulder. ok for PROM shoulder flex 0-90*, abduction 0-60* and external rotation 0-30* Shoulder Interventions: Shoulder sling/immobilizer;Off for dressing/bathing/exercises Precaution Booklet Issued: Yes (comment) Recall of Precautions/Restrictions: Intact Required Braces or Orthoses: Sling Restrictions Weight Bearing Restrictions Per Provider Order: Yes RUE Weight Bearing Per Provider Order: Non weight bearing      Mobility  Bed Mobility Overal bed mobility: Needs Assistance Bed Mobility: Supine to Sit     Supine to sit: Supervision, HOB elevated     General bed mobility comments: Pt sat up on L side of bed with increased time. RUE supported in sling.    Transfers Overall transfer level: Needs assistance Equipment used: Straight cane Transfers: Sit to/from Stand Sit to Stand: Supervision   Step pivot transfers: Supervision       General transfer comment: Pt stood from lowest bed height. He utilized momentum and pushed up with LUE support. No physical assist to stand. Supervision for safety. Poor eccentric control, plopping down on surface.    Ambulation/Gait Ambulation/Gait assistance: Contact guard assist Gait Distance (Feet): 80 Feet Assistive device: Straight cane Gait Pattern/deviations: Decreased stride length, Wide base of support, Drifts right/left, Step-to pattern Gait velocity: decreased Gait velocity interpretation: <1.8 ft/sec, indicate of risk for recurrent falls   General Gait Details: Pt ambulated with short slow steps. He maintained a WBOS and slight forward lean. Pt held cane in LUE and kept AD infront of him. Cued pt to bring it to his side and advance RLE with AD. Pt had difficulty sequencing. He was slightly unsteady, but had no LOB.  Stairs Stairs: Yes Stairs assistance: Contact guard assist Stair Management: One rail Left, Forwards, Step to pattern  Number of Stairs: 3 (x2) General stair comments: Pt  ascended/descended shorter steps to simulate his home envrionment. He left cane and switched to handrail. Pt led with RLE. No LOB. Discussed how his family should be positioned to support him.  Wheelchair Mobility     Tilt Bed    Modified Rankin (Stroke Patients Only)       Balance Overall balance assessment: Needs assistance Sitting-balance support: No upper extremity supported, Feet supported Sitting balance-Leahy Scale: Good     Standing balance support: During functional activity, Single extremity supported, Reliant on assistive device for balance Standing balance-Leahy Scale: Fair Standing balance comment: Pt utilizes cane for mobility and was slightly unsteady                             Pertinent Vitals/Pain Pain Assessment Pain Assessment: Faces Faces Pain Scale: Hurts little more Pain Location: R shoulder Pain Descriptors / Indicators: Discomfort, Aching, Sore Pain Intervention(s): Monitored during session, Patient requesting pain meds-RN notified, Limited activity within patient's tolerance, Repositioned    Home Living Family/patient expects to be discharged to:: Private residence Living Arrangements: Spouse/significant other Available Help at Discharge: Family;Available 24 hours/day Type of Home: House Home Access: Stairs to enter Entrance Stairs-Rails: Doctor, general practice of Steps: 4   Home Layout: One level Home Equipment: Shower seat;Cane - single point      Prior Function Prior Level of Function : Independent/Modified Independent             Mobility Comments: Ambulates using SPC. Pt reports 2-3 falls in the past 53mo. ADLs Comments: Indep with ADLs/IADLs.     Extremity/Trunk Assessment   Upper Extremity Assessment Upper Extremity Assessment: Defer to OT evaluation    Lower Extremity Assessment Lower Extremity Assessment: Overall WFL for tasks assessed    Cervical / Trunk Assessment Cervical / Trunk Assessment:  Other exceptions;Back Surgery Cervical / Trunk Exceptions: Body Habitus  Communication   Communication Communication: No apparent difficulties    Cognition Arousal: Alert Behavior During Therapy: WFL for tasks assessed/performed   PT - Cognitive impairments: No apparent impairments                       PT - Cognition Comments: Pt A,Ox4` Following commands: Intact       Cueing Cueing Techniques: Verbal cues     General Comments General comments (skin integrity, edema, etc.): VSS on RA    Exercises     Assessment/Plan    PT Assessment Patient needs continued PT services  PT Problem List Decreased strength;Decreased range of motion;Decreased activity tolerance;Decreased balance;Decreased mobility       PT Treatment Interventions DME instruction;Gait training;Stair training;Functional mobility training;Therapeutic activities;Therapeutic exercise;Balance training;Patient/family education    PT Goals (Current goals can be found in the Care Plan section)  Acute Rehab PT Goals Patient Stated Goal: Return Home PT Goal Formulation: With patient Time For Goal Achievement: 07/25/24 Potential to Achieve Goals: Good    Frequency Min 2X/week     Co-evaluation               AM-PAC PT 6 Clicks Mobility  Outcome Measure Help needed turning from your back to your side while in a flat bed without using bedrails?: A Little Help needed moving from lying on your back to sitting on the side of a flat bed without using bedrails?: A Little Help needed moving to and from a bed to a chair (  including a wheelchair)?: A Little Help needed standing up from a chair using your arms (e.g., wheelchair or bedside chair)?: A Little Help needed to walk in hospital room?: A Little Help needed climbing 3-5 steps with a railing? : A Little 6 Click Score: 18    End of Session Equipment Utilized During Treatment: Gait belt Activity Tolerance: Patient tolerated treatment  well Patient left: in chair;with call bell/phone within reach Nurse Communication: Mobility status;Patient requests pain meds PT Visit Diagnosis: Unsteadiness on feet (R26.81);Other abnormalities of gait and mobility (R26.89)    Time: 8373-8353 PT Time Calculation (min) (ACUTE ONLY): 20 min   Charges:   PT Evaluation $PT Eval Low Complexity: 1 Low   PT General Charges $$ ACUTE PT VISIT: 1 Visit         Randall SAUNDERS, PT, DPT Acute Rehabilitation Services Office: [phone] Secure Chat Preferred  Delon CHRISTELLA Callander 07/11/2024, 5:14 PM

## 2024-07-11 NOTE — Progress Notes (Signed)
  Subjective: Patient stable.  Pain controlled.  Was able to get up and around in the room yesterday.   Objective: Vital signs in last 24 hours: Temp:  [97.7 F (36.5 C)-98.7 F (37.1 C)] 97.7 F (36.5 C) (09/04 0435) Pulse Rate:  [90-101] 90 (09/04 0435) Resp:  [14-16] 14 (09/04 0435) BP: (106-145)/(65-98) 134/83 (09/04 0435) SpO2:  [90 %-98 %] 94 % (09/04 0444)  Intake/Output from previous day: 09/03 0701 - 09/04 0700 In: 1560 [P.O.:1560] Out: -  Intake/Output this shift: No intake/output data recorded.  Exam:  No cellulitis present Compartment soft  Labs: No results for input(s): HGB in the last 72 hours. No results for input(s): WBC, RBC, HCT, PLT in the last 72 hours. No results for input(s): NA, K, CL, CO2, BUN, CREATININE, GLUCOSE, CALCIUM in the last 72 hours. No results for input(s): LABPT, INR in the last 72 hours.  Assessment/Plan: Plan at this time is to continue with occupational therapy today.  Would also like to add physical therapy to obtain baseline assessment for suitability for discharge possibly tomorrow afternoon.  Overall his pain is controlled and he is getting a little bit more functional with the right shoulder.   Christian [name] 07/11/2024, 7:22 AM

## 2024-07-11 NOTE — Progress Notes (Signed)
 Mobility Specialist Progress Note:    07/11/24 1206  Mobility  Activity Ambulated with assistance  Level of Assistance Contact guard assist, steadying assist  Assistive Device Cane  Distance Ambulated (ft) 50 ft  RUE Weight Bearing Per Provider Order NWB  Activity Response Tolerated well  Mobility Referral Yes  Mobility visit 1 Mobility  Mobility Specialist Start Time (ACUTE ONLY) 1107  Mobility Specialist Stop Time (ACUTE ONLY) 1118  Mobility Specialist Time Calculation (min) (ACUTE ONLY) 11 min   Pt received in chair agreeable to mobility. No physical assistance needed just contact guard for safety. Distance limited d/t back pain. Pushed back to room in recliner. Left comfortably in chair w/ call bell and personal belongings in reach. All needs met.   Thersia Minder Mobility Specialist  Please contact vis Secure Chat or  Rehab Office [phone]

## 2024-07-11 NOTE — Plan of Care (Signed)
  Problem: Education: Goal: Knowledge of General Education information will improve Description: Including pain rating scale, medication(s)/side effects and non-pharmacologic comfort measures Outcome: Progressing   Problem: Clinical Measurements: Goal: Ability to maintain clinical measurements within normal limits will improve Outcome: Progressing Goal: Will remain free from infection Outcome: Progressing Goal: Respiratory complications will improve Outcome: Progressing Goal: Cardiovascular complication will be avoided Outcome: Progressing   Problem: Activity: Goal: Risk for activity intolerance will decrease Outcome: Progressing   Problem: Coping: Goal: Level of anxiety will decrease Outcome: Progressing   Problem: Elimination: Goal: Will not experience complications related to urinary retention Outcome: Progressing   Problem: Pain Managment: Goal: General experience of comfort will improve and/or be controlled Outcome: Not Progressing

## 2024-07-11 NOTE — Progress Notes (Signed)
 Occupational Therapy Treatment Patient Details Name: Christian [name] MRN: [id number] DOB: 11-30-1950 Today's Date: 07/11/2024   History of present illness Pt is a 73 y/o male admitted for R reverse shoulder replacement in setting of rotator cuff tear and arthritis. PMH: L knee OA, GERD, HTN, obesity, gout, seizures, DM1, hx of back surgery April 2025   OT comments  Pt making gradual progress towards OT goals. Pt able to mobilize to/from bathroom with his cane. Pt aware of limitations from chronic L knee OA- opted to perform UB ADLs seated at sink. Pt continues to require intermittent cues to avoid AROM of R shoulder. Due to arrival of breakfast, unable to further assess PROM/pendulum exercises introduced yesterday. Will follow up for hopefully another session to further address these allowed UE exercises. Continue to recommend DC home w/ wife's assist.      If plan is discharge home, recommend the following:  A lot of help with bathing/dressing/bathroom;Assistance with cooking/housework;Assist for transportation   Equipment Recommendations  None recommended by OT    Recommendations for Other Services      Precautions / Restrictions Precautions Precautions: Fall;Other (comment);Shoulder Type of Shoulder Precautions: no AROM of shoulder. ok for PROM shoulder flex 0-90*, abduction 0-60* and external rotation 0-30* Shoulder Interventions: Shoulder sling/immobilizer;Off for dressing/bathing/exercises Precaution Booklet Issued: Yes (comment) Recall of Precautions/Restrictions: Impaired Required Braces or Orthoses: Sling Restrictions Weight Bearing Restrictions Per Provider Order: Yes RUE Weight Bearing Per Provider Order: Non weight bearing       Mobility Bed Mobility Overal bed mobility: Needs Assistance Bed Mobility: Supine to Sit     Supine to sit: Supervision, HOB elevated     General bed mobility comments: cues for RUE protection    Transfers Overall transfer level:  Needs assistance Equipment used: Straight cane Transfers: Sit to/from Stand Sit to Stand: Supervision           General transfer comment: from bedside and chair in bathroom.     Balance Overall balance assessment: Needs assistance Sitting-balance support: No upper extremity supported, Feet supported Sitting balance-Leahy Scale: Good     Standing balance support: No upper extremity supported, During functional activity Standing balance-Leahy Scale: Fair Standing balance comment: cane for mobility                           ADL either performed or assessed with clinical judgement   ADL Overall ADL's : Needs assistance/impaired Eating/Feeding: Set up;Sitting Eating/Feeding Details (indicate cue type and reason): assist to cut up food Grooming: Set up;Sitting;Oral care;Minimal assistance;Brushing hair Grooming Details (indicate cue type and reason): pt requested to sit at sink for oral care, reports L knee will fatigue/give way with prolonged standing. Cues to avoid reaching with RUE during task. Min A to comb back of hair due to baseline L shoulder ROM impairments                             Functional mobility during ADLs: Contact guard assist;Cane General ADL Comments: Min A for sling mgmt today    Extremity/Trunk Assessment Upper Extremity Assessment Upper Extremity Assessment: Right hand dominant;Left hand dominant;RUE deficits/detail;LUE deficits/detail RUE Deficits / Details: improved sensation, able to use hand, wrist and elbow. Cues to avoid AROM of shoulder RUE Coordination: decreased gross motor LUE Deficits / Details: hx of rotator cuff tear > 40 years ago- some shoulder flexion impairments but able to use this UE functionally,  difficulty reaching over to R side and overhead for ADLs. ambidextrous LUE Coordination: decreased gross motor   Lower Extremity Assessment Lower Extremity Assessment: Defer to PT evaluation        Vision   Vision  Assessment?: No apparent visual deficits   Perception     Praxis     Communication Communication Communication: No apparent difficulties   Cognition Arousal: Alert Behavior During Therapy: WFL for tasks assessed/performed Cognition: No family/caregiver present to determine baseline             OT - Cognition Comments: tangential, frequent cues to avoid AROM of shoulder                 Following commands: Intact        Cueing   Cueing Techniques: Verbal cues  Exercises      Shoulder Instructions       General Comments      Pertinent Vitals/ Pain       Pain Assessment Pain Assessment: No/denies pain  Home Living                                          Prior Functioning/Environment              Frequency  Min 2X/week        Progress Toward Goals  OT Goals(current goals can now be found in the care plan section)  Progress towards OT goals: Progressing toward goals  Acute Rehab OT Goals Patient Stated Goal: home soon OT Goal Formulation: With patient Time For Goal Achievement: 07/24/24 Potential to Achieve Goals: Good ADL Goals Pt Will Perform Grooming: with modified independence;standing Pt Will Perform Upper Body Dressing: with supervision;sitting;with adaptive equipment Pt Will Perform Lower Body Dressing: with supervision;sit to/from stand;sitting/lateral leans Pt Will Transfer to Toilet: with modified independence;ambulating Additional ADL Goal #1: Pt to manage sling with Modified Independence  Plan      Co-evaluation                 AM-PAC OT 6 Clicks Daily Activity     Outcome Measure   Help from another person eating meals?: A Little Help from another person taking care of personal grooming?: A Little Help from another person toileting, which includes using toliet, bedpan, or urinal?: A Little Help from another person bathing (including washing, rinsing, drying)?: A Lot Help from another person to  put on and taking off regular upper body clothing?: A Lot Help from another person to put on and taking off regular lower body clothing?: A Lot 6 Click Score: 15    End of Session Equipment Utilized During Treatment: Other (comment) (sling)  OT Visit Diagnosis: Unsteadiness on feet (R26.81);Muscle weakness (generalized) (M62.81)   Activity Tolerance Patient tolerated treatment well   Patient Left in chair;with call bell/phone within reach;with nursing/sitter in room   Nurse Communication Mobility status        Time: 9269-9249 OT Time Calculation (min): 20 min  Charges: OT General Charges $OT Visit: 1 Visit OT Treatments $Self Care/Home Management : 8-22 mins  Mliss NOVAK, OTR/L Acute Rehab Services Office: [phone]   Mliss Fish 07/11/2024, 8:18 AM

## 2024-07-12 ENCOUNTER — Other Ambulatory Visit: Payer: Self-pay

## 2024-07-12 DIAGNOSIS — M19011 Primary osteoarthritis, right shoulder: Secondary | ICD-10-CM | POA: Diagnosis not present

## 2024-07-12 DIAGNOSIS — Z96651 Presence of right artificial knee joint: Secondary | ICD-10-CM | POA: Diagnosis not present

## 2024-07-12 DIAGNOSIS — L97419 Non-pressure chronic ulcer of right heel and midfoot with unspecified severity: Secondary | ICD-10-CM | POA: Diagnosis not present

## 2024-07-12 DIAGNOSIS — Z7984 Long term (current) use of oral hypoglycemic drugs: Secondary | ICD-10-CM | POA: Diagnosis not present

## 2024-07-12 DIAGNOSIS — E11621 Type 2 diabetes mellitus with foot ulcer: Secondary | ICD-10-CM | POA: Diagnosis not present

## 2024-07-12 DIAGNOSIS — Z7982 Long term (current) use of aspirin: Secondary | ICD-10-CM | POA: Diagnosis not present

## 2024-07-12 LAB — GLUCOSE, CAPILLARY
Glucose-Capillary: 138 mg/dL — ABNORMAL HIGH (ref 70–99)
Glucose-Capillary: 226 mg/dL — ABNORMAL HIGH (ref 70–99)
Glucose-Capillary: 163 mg/dL — ABNORMAL HIGH (ref 70–99)

## 2024-07-12 MED ORDER — TRAMADOL HCL 50 MG PO TABS: 50.0000 mg | ORAL_TABLET | Freq: Two times a day (BID) | ORAL | 0 refills | Status: DC | PRN

## 2024-07-12 MED ORDER — TIZANIDINE HCL 2 MG PO TABS: 2.0000 mg | ORAL_TABLET | Freq: Three times a day (TID) | ORAL | 0 refills | Status: DC | PRN

## 2024-07-12 MED ORDER — DOCUSATE SODIUM 100 MG PO CAPS: 100.0000 mg | ORAL_CAPSULE | Freq: Two times a day (BID) | ORAL | 0 refills | Status: AC

## 2024-07-12 MED ORDER — ACETAMINOPHEN 325 MG PO TABS: 325.0000 mg | ORAL_TABLET | Freq: Four times a day (QID) | ORAL | 0 refills | Status: AC | PRN

## 2024-07-12 MED ORDER — OXYCODONE HCL 5 MG PO TABS: 5.0000 mg | ORAL_TABLET | ORAL | 0 refills | Status: DC | PRN

## 2024-07-12 MED ORDER — ASPIRIN 81 MG PO TBEC: 81.0000 mg | DELAYED_RELEASE_TABLET | Freq: Every day | ORAL | 12 refills | Status: AC
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
## 2024-07-12 NOTE — Progress Notes (Signed)
 Mobility Specialist Progress Note:    07/12/24 1010  Mobility  Activity Ambulated with assistance  Level of Assistance Contact guard assist, steadying assist  Assistive Device Cane  Distance Ambulated (ft) 50 ft  RUE Weight Bearing Per Provider Order NWB  Activity Response Tolerated well  Mobility Referral Yes  Mobility visit 1 Mobility  Mobility Specialist Start Time (ACUTE ONLY) [id number]  Mobility Specialist Stop Time (ACUTE ONLY) 1010  Mobility Specialist Time Calculation (min) (ACUTE ONLY) 14 min   Pt received in chair agreeable to mobility. No physical assistance needed. Pt took x1 seated rest break. Distance limited d/t back pain. Returned to room w/o fault. Left in chair w/ call bell and personal belongings in reach. All needs met.  Thersia Minder Mobility Specialist  Please contact vis Secure Chat or  Rehab Office [phone]

## 2024-07-12 NOTE — Progress Notes (Signed)
 Med details completed on AVS.D/C delay; SW working on setting up [facility] and PT evaluation  Prior to d/c.

## 2024-07-12 NOTE — Progress Notes (Signed)
 Reviewed AVS, patient expressed understanding of medications, MD follow up reviewed.   IV removed by primary Nurse Jinnie; site clean and dry. See LDA for information on wounds at discharge. Patient states all belongings brought to the hospital at time of admission are accounted for and will be packed to take home once his wife and son arrive around 2 pm. Patient informed and expressed understanding where to pick up discharge medications.   Patient refused discharge lounge. Informed patient discharge lounge is a comfortable and safe space to watch his laptop and have phone conversations.  Patient stated he would rather stay in his room in case he had to use the bathroom. Educated patient the d/c lounge is staffed with aides to assist with ambulation to rest room and seats are available near restroom for easy access if needed. Patient replied he would be fine staying in his room, and reinforced his son and wife would take care of packing belongings when they arrive around 2pm.  Notified staff nurse Jinnie ,charge nurse Artist and Unit Nurse Manager of patient refusal to go to discharge lounge.

## 2024-07-12 NOTE — TOC Progression Note (Addendum)
 Transition of Care [facility]) - Progression Note    Patient Details  Name: Christian [name] MRN: [id number] Date of Birth: 1950/12/23  Transition of Care [facility]) CM/SW Contact  Nola Devere Hands, RN Phone Number: 07/12/2024, 10:25 AM  Clinical Narrative:    Case Manager spoke with patient concerning recommendation for HHPT. Patient was setup with [facility].  Mr. Heintzelman stated that their home is not setup for therapy at this time, they have repairs in process. He would prefer to go to outpatient therapy if he can arrange transportation, says the [facility] on [address] is best for him. CM will notify MD and request referral order. No further needs identified.  1036: Order received and referral placed. [id number]     Barriers to Discharge: No Barriers Identified               Expected Discharge Plan and Services In-house Referral: NA Discharge Planning Services: CM Consult Post Acute Care Choice: Home Health Living arrangements for the past 2 months: Single Family Home Expected Discharge Date: 07/12/24               DME Arranged: N/A         HH Arranged: Patient Refused HH HH Agency: [facility]         Social Drivers of Health (SDOH) Interventions SDOH Screenings   Food Insecurity: No Food Insecurity (07/09/2024)  Housing: Unknown (07/09/2024)  Transportation Needs: No Transportation Needs (07/09/2024)  Utilities: Not At Risk (07/09/2024)  Alcohol Screen: Low Risk  (12/11/2023)  Depression (PHQ2-9): Low Risk  (03/29/2024)  Financial Resource Strain: Low Risk  (04/02/2024)   Received from [facility]  Physical Activity: Inactive (12/11/2023)  Social Connections: Moderately Isolated (07/09/2024)  Stress: No Stress Concern Present (03/04/2024)   Received from [facility]  Tobacco Use: Medium Risk (07/09/2024)  Health Literacy: Adequate Health Literacy (12/11/2023)    Readmission Risk Interventions     No data to display

## 2024-07-12 NOTE — Discharge Summary (Signed)
 Physician Discharge Summary      Patient ID: Christian [name] MRN: [id number] DOB/AGE: 74-Mar-1952 74 y.o.  Admit date: 07/09/2024 Discharge date: 07/12/2024  Admission Diagnoses:  Principal Problem:   Shoulder arthritis   Discharge Diagnoses:  Same  Surgeries: Procedure(s): ARTHROPLASTY, SHOULDER, TOTAL, REVERSE on 07/09/2024   Consultants:   Discharged Condition: Stable  Hospital Course: Christian [name] is an 73 y.o. male who was admitted 07/09/2024 with a chief complaint of right shoulder pain, and found to have a diagnosis of Shoulder arthritis.  They were brought to the operating room on 07/09/2024 and underwent the above named procedures.  Patient tolerated procedure well.  He was mobilized with occupational therapy for the shoulder and physical therapy just to get around.  He was doing well with physical therapy in terms of mobilization and his pain was controlled at the time of discharge.  He will continue with home health physical therapy 2 times a week for the next 2 weeks.  We will start him in outpatient therapy after that.  He is discharged home in good condition.  Antibiotics given:  Anti-infectives (From admission, onward)    Start     Dose/Rate Route Frequency Ordered Stop   07/09/24 2030  ceFAZolin  (ANCEF ) IVPB 2g/100 mL premix        2 g 200 mL/hr over 30 Minutes Intravenous Every 6 hours 07/09/24 1944 07/10/24 0238   07/09/24 1315  vancomycin  (VANCOCIN ) powder  Status:  Discontinued          As needed 07/09/24 1315 07/09/24 1536   07/09/24 0945  ceFAZolin  (ANCEF ) IVPB 3g/150 mL premix  Status:  Discontinued        3 g 300 mL/hr over 30 Minutes Intravenous On call to O.R. 07/09/24 0931 07/09/24 1638   07/09/24 0932  ceFAZolin  (ANCEF ) 2-4 GM/100ML-% IVPB       Note to Pharmacy: Effie Rily O: cabinet override      07/09/24 0932 07/09/24 2144     .  Recent vital signs:  Vitals:   07/11/24 1945 07/12/24 0521  BP: 110/84 116/77  Pulse: 95 98  Resp:  18   Temp: 98.2 F (36.8 C) 98.2 F (36.8 C)  SpO2: 94% 99%    Recent laboratory studies:  Results for orders placed or performed during the hospital encounter of 07/09/24  Glucose, capillary   Collection Time: 07/09/24  9:50 AM  Result Value Ref Range   Glucose-Capillary 142 (H) 70 - 99 mg/dL   Comment 1 Notify RN   Glucose, capillary   Collection Time: 07/09/24 11:46 AM  Result Value Ref Range   Glucose-Capillary 123 (H) 70 - 99 mg/dL  Glucose, capillary   Collection Time: 07/09/24  3:43 PM  Result Value Ref Range   Glucose-Capillary 185 (H) 70 - 99 mg/dL   Comment 1 Notify RN    Comment 2 Document in Chart   Glucose, capillary   Collection Time: 07/09/24  4:52 PM  Result Value Ref Range   Glucose-Capillary 182 (H) 70 - 99 mg/dL  Glucose, capillary   Collection Time: 07/09/24  9:16 PM  Result Value Ref Range   Glucose-Capillary 321 (H) 70 - 99 mg/dL   Comment 1 QC Due   Glucose, capillary   Collection Time: 07/10/24  6:14 AM  Result Value Ref Range   Glucose-Capillary 207 (H) 70 - 99 mg/dL  Glucose, capillary   Collection Time: 07/10/24 11:14 AM  Result Value Ref Range   Glucose-Capillary 116 (H)  70 - 99 mg/dL  Glucose, capillary   Collection Time: 07/10/24  4:19 PM  Result Value Ref Range   Glucose-Capillary 124 (H) 70 - 99 mg/dL  Glucose, capillary   Collection Time: 07/10/24  9:24 PM  Result Value Ref Range   Glucose-Capillary 138 (H) 70 - 99 mg/dL  Glucose, capillary   Collection Time: 07/11/24  6:08 AM  Result Value Ref Range   Glucose-Capillary 145 (H) 70 - 99 mg/dL  Glucose, capillary   Collection Time: 07/11/24  7:46 AM  Result Value Ref Range   Glucose-Capillary 147 (H) 70 - 99 mg/dL  Glucose, capillary   Collection Time: 07/11/24 12:01 PM  Result Value Ref Range   Glucose-Capillary 142 (H) 70 - 99 mg/dL  Glucose, capillary   Collection Time: 07/11/24  4:01 PM  Result Value Ref Range   Glucose-Capillary 73 70 - 99 mg/dL  Glucose, capillary    Collection Time: 07/11/24  9:23 PM  Result Value Ref Range   Glucose-Capillary 159 (H) 70 - 99 mg/dL  Glucose, capillary   Collection Time: 07/12/24  6:24 AM  Result Value Ref Range   Glucose-Capillary 138 (H) 70 - 99 mg/dL    Discharge Medications:   Allergies as of 07/12/2024       Reactions   Jardiance  [empagliflozin ]    Yeast infections   Metoprolol  Other (See Comments)   Caused blood sugars to be elevated and not controlled   Statins    Muscle aches, joint pain   Penicillins Rash        Medication List     STOP taking these medications    valsartan  40 MG tablet Commonly known as: DIOVAN        TAKE these medications    acetaminophen  325 MG tablet Commonly known as: TYLENOL  Take 1-2 tablets (325-650 mg total) by mouth every 6 (six) hours as needed for mild pain (pain score 1-3) or fever (or temp > 100.5). What changed:  medication strength how much to take reasons to take this   aspirin  EC 81 MG tablet Take 1 tablet (81 mg total) by mouth daily. Swallow whole. What changed:  when to take this additional instructions   BD Pen Needle Nano 2nd Gen 32G X 4 MM Misc Generic drug: Insulin  Pen Needle USE AS DIRECTED 5 TIMES A DAY WITH INSULIN  INJECTIONS   diclofenac  75 MG EC tablet Commonly known as: VOLTAREN  Take 75 mg by mouth 2 (two) times daily.   docusate sodium  100 MG capsule Commonly known as: COLACE Take 1 capsule (100 mg total) by mouth 2 (two) times daily.   FreeStyle Libre 3 Sensor Misc by Does not apply route. Use to check glucose continuously change every 14 days   HumuLIN  R U-500 KwikPen 500 UNIT/ML KwikPen Generic drug: insulin  regular human CONCENTRATED INJECT 200 UNITS UNDER SKIN A DAY AS ADVISED What changed: See the new instructions.   metFORMIN  1000 MG tablet Commonly known as: GLUCOPHAGE  Take 2 tablets (2,000 mg total) by mouth daily with breakfast.   multivitamin with minerals Tabs tablet Take 1 tablet by mouth daily with  lunch. What changed: when to take this   oxyCODONE  5 MG immediate release tablet Commonly known as: Oxy IR/ROXICODONE  Take 1-2 tablets (5-10 mg total) by mouth every 4 (four) hours as needed for moderate pain (pain score 4-6) (pain score 4-6). What changed:  how much to take when to take this reasons to take this   tiZANidine  2 MG tablet Commonly known as:  ZANAFLEX  Take 1 tablet (2 mg total) by mouth every 8 (eight) hours as needed for muscle spasms.   traMADol  50 MG tablet Commonly known as: ULTRAM  Take 1 tablet (50 mg total) by mouth every 12 (twelve) hours as needed. What changed: reasons to take this        Diagnostic Studies: DG Shoulder Right Port Result Date: 07/09/2024 CLINICAL DATA:  Shoulder arthritis, postop. EXAM: RIGHT SHOULDER - 1 VIEW COMPARISON:  Preoperative imaging. FINDINGS: Reverse right shoulder arthroplasty in expected alignment. No periprosthetic lucency or fracture. Recent postsurgical change includes air and edema in the joint space and soft tissues. IMPRESSION: Reverse right shoulder arthroplasty without immediate postoperative complication. Electronically Signed   By: Andrea Gasman M.D.   On: 07/09/2024 16:58   CT Shoulder Right Wo Contrast Result Date: 06/18/2024 CLINICAL DATA:  Shoulder pain, chronic, instability suspected, xray done Shoulder pain, chronic, rotator cuff disorder suspected, xray done Recent anterior shoulder dislocation. EXAM: CT OF THE UPPER RIGHT EXTREMITY WITHOUT CONTRAST TECHNIQUE: Multidetector CT imaging of the right shoulder was performed according to the standard protocol. RADIATION DOSE REDUCTION: This exam was performed according to the departmental dose-optimization program which includes automated exposure control, adjustment of the mA and/or kV according to patient size and/or use of iterative reconstruction technique. COMPARISON:  Radiographs 06/18/2024. CT of the right shoulder 06/10/2024. FINDINGS: Bones/Joint/Cartilage  Interval reduction of the previously demonstrated anterior shoulder dislocation. The humeral head is now located. There is no evidence of acute fracture. A large complex glenohumeral joint effusion is again noted, similar to previous CT. There is debris and scattered calcifications within the joint. Underlying acromioclavicular degenerative changes are present with associated chronic osseous fragmentation. Compared with the recent prior CT, there is increased widening of the acromioclavicular joint with a sharp anterior margin of the acromion, suggesting interval acromioplasty. In the absence of surgery, this could be due to osteolysis or AC joint injury. The scapula otherwise appears unremarkable. Ligaments Suboptimally assessed by CT. Muscles and Tendons Severe chronic fatty atrophy of the supraspinatus, infraspinatus and subscapularis muscles again noted. The humeral head is high-riding with obliteration of the subacromial space, likely due to a chronic rotator cuff tear. Soft tissues As above, persistent large complex shoulder joint effusion. Fluid extends into the subcoracoid bursa. No other periarticular fluid collections or foreign bodies are seen. Aortic atherosclerosis and mild emphysema noted. IMPRESSION: 1. Interval reduction of previously demonstrated anterior shoulder dislocation. No evidence of acute fracture. 2. Persistent large complex glenohumeral joint effusion with debris and scattered calcifications. 3. Increased widening of the acromioclavicular joint with a sharp anterior margin of the acromion, suggesting interval acromioplasty. In the absence of surgery, this could be due to osteolysis or AC joint injury. 4. Chronic rotator cuff tear with severe chronic fatty atrophy of the supraspinatus, infraspinatus and subscapularis muscles. 5. Aortic Atherosclerosis (ICD10-I70.0) and Emphysema (ICD10-J43.9). Electronically Signed   By: Elsie Perone M.D.   On: 06/18/2024 13:25   DG Shoulder  Right Result Date: 06/18/2024 CLINICAL DATA:  Shoulder pain.  No known injury. EXAM: RIGHT SHOULDER - 2+ VIEW COMPARISON:  Shoulder CT 1 week ago 06/10/2024 FINDINGS: The anterior shoulder dislocation on recent CT is not definitively seen on the current exam. There is mild inferior subluxation of the humeral head which is likely due to underlying joint effusion. Stable acromioclavicular degenerative change. No evidence of fracture or erosion. IMPRESSION: 1. Mild inferior subluxation of the humeral head is likely due to underlying joint effusion. Anterior shoulder dislocation on recent  CT is not seen. 2. Redemonstrated acromioclavicular degenerative change. Electronically Signed   By: Andrea Gasman M.D.   On: 06/18/2024 10:15   DG Chest 2 View Result Date: 06/16/2024 CLINICAL DATA:  Weight loss, evaluate for intrathoracic process EXAM: CHEST - 2 VIEW COMPARISON:  None available. FINDINGS: No focal airspace consolidation, pleural effusion, or pneumothorax. No cardiomegaly. Tortuous aorta with aortic atherosclerosis. No acute fracture or destructive lesions. Multilevel thoracic osteophytosis. IMPRESSION: No acute cardiopulmonary abnormality. Electronically Signed   By: Rogelia Myers M.D.   On: 06/16/2024 20:42    Disposition: Discharge disposition: 01-Home or Self Care       Discharge Instructions     Call MD / Call 911   Complete by: As directed    If you experience chest pain or shortness of breath, CALL 911 and be transported to the hospital emergency room.  If you develope a fever above 101 F, pus (white drainage) or increased drainage or redness at the wound, or calf pain, call your surgeon's office.   Constipation Prevention   Complete by: As directed    Drink plenty of fluids.  Prune juice may be helpful.  You may use a stool softener, such as Colace (over the counter) 100 mg twice a day.  Use MiraLax  (over the counter) for constipation as needed.   Diet - low sodium heart healthy    Complete by: As directed    Discharge instructions   Complete by: As directed    1.  Okay to shower dressing is waterproof 2.  Okay to be out of the sling when resting but when up and about use the shoulder sling 3.  No lifting with right arm 4.  No weightbearing through right arm for the first 2 weeks then okay to weight-bear with a walker at that time   .nar   Increase activity slowly as tolerated   Complete by: As directed    Post-operative opioid taper instructions:   Complete by: As directed    POST-OPERATIVE OPIOID TAPER INSTRUCTIONS: It is important to wean off of your opioid medication as soon as possible. If you do not need pain medication after your surgery it is ok to stop day one. Opioids include: Codeine, Hydrocodone (Norco, Vicodin), Oxycodone (Percocet, oxycontin ) and hydromorphone  amongst others.  Long term and even short term use of opiods can cause: Increased pain response Dependence Constipation Depression Respiratory depression And more.  Withdrawal symptoms can include Flu like symptoms Nausea, vomiting And more Techniques to manage these symptoms Hydrate well Eat regular healthy meals Stay active Use relaxation techniques(deep breathing, meditating, yoga) Do Not substitute Alcohol to help with tapering If you have been on opioids for less than two weeks and do not have pain than it is ok to stop all together.  Plan to wean off of opioids This plan should start within one week post op of your joint replacement. Maintain the same interval or time between taking each dose and first decrease the dose.  Cut the total daily intake of opioids by one tablet each day Next start to increase the time between doses. The last dose that should be eliminated is the evening dose.             Signed: KANDICE Glendia Hutchinson 07/12/2024, 7:13 AM

## 2024-07-13 ENCOUNTER — Other Ambulatory Visit: Payer: Self-pay | Admitting: *Deleted

## 2024-07-13 DIAGNOSIS — Z96611 Presence of right artificial shoulder joint: Secondary | ICD-10-CM

## 2024-07-13 DIAGNOSIS — M19011 Primary osteoarthritis, right shoulder: Secondary | ICD-10-CM

## 2024-07-17 ENCOUNTER — Other Ambulatory Visit: Payer: Self-pay | Admitting: Internal Medicine

## 2024-07-24 ENCOUNTER — Encounter: Payer: Self-pay | Admitting: Orthopedic Surgery

## 2024-07-24 ENCOUNTER — Other Ambulatory Visit: Payer: Self-pay

## 2024-07-24 ENCOUNTER — Other Ambulatory Visit (INDEPENDENT_AMBULATORY_CARE_PROVIDER_SITE_OTHER): Payer: Self-pay

## 2024-07-24 ENCOUNTER — Encounter (HOSPITAL_COMMUNITY): Payer: Self-pay | Admitting: Orthopedic Surgery

## 2024-07-24 ENCOUNTER — Ambulatory Visit (INDEPENDENT_AMBULATORY_CARE_PROVIDER_SITE_OTHER): Admitting: Orthopedic Surgery

## 2024-07-24 DIAGNOSIS — Z96611 Presence of right artificial shoulder joint: Secondary | ICD-10-CM

## 2024-07-24 NOTE — Progress Notes (Signed)
 Patient to have clear liquids until 2:00 pm DOS 07-25-24, concerned about instructions for insulin  due to surgery later in the day. Patient made aware to have a light breakfast before 7:30 am DOS (toast, eggs or oatmeal and banana) nothing to eat after that and continue with liquids until 2:00 pm, He is aware to take half of his U-500 insulin  in the am which would be 25 units. Christian [name]  and Diabetes coordinator  Lauren aware of instructions. Diabetes coordinator also recommend patient have a long acting insulin  and scheduled short acting while he is in patient.

## 2024-07-24 NOTE — Progress Notes (Signed)
 Post-Op Visit Note   Patient: Christian [name]           Date of Birth: 12/20/1950           MRN: [id number] Visit Date: 07/24/2024 PCP: Patient, No Pcp Per   Assessment & Plan:  Chief Complaint:  Chief Complaint  Patient presents with   Right Shoulder - Routine Post Op    RIGHT RSA (surgery date 07-09-24)   Visit Diagnoses:  1. History of arthroplasty of right shoulder     Plan: Dantonio is now 2 weeks out right reverse shoulder replacement.  Patient states he is having pain.  On exam it is difficult to say if the deltoid is firing.  There is a fullness anteriorly.  Motor or sensory function to the hand is intact.  Radial pulses intact.  No paresthesias in that deltoid region right versus left.  Radiographs do demonstrate anterior dislocation of that prosthesis.  He states it has been hurting for several days but does not recall a specific event when the pain started.  Unclear etiology of the event.  He did have located prosthesis in the immediate postop period Impression is anterior instability of reverse shoulder replacement.  Plan is operative reduction and component polyethylene exchange.  Essentially he has the biggest components available on the glenoid side.  On the humeral side there is 1 implant that could be used which is a +3 retentive poly.  Outside of that which would add about 5 mm to the construct there is nothing bigger to put into the shoulder.  We will have SRS system available to create more space for stability if needed.  Risk and benefits are discussed including recurrent instability.  He essentially had no rotator cuff attached to the humeral head at the time of his implantation.  Also is having some shoulder instability of his native shoulder prior to the reverse replacement.  Follow-up tomorrow for surgical reduction.  And revision.  Follow-Up Instructions: No follow-ups on file.   Orders:  Orders Placed This Encounter  Procedures   XR Shoulder Right   No  orders of the defined types were placed in this encounter.   Imaging: No results found.  PMFS History: Patient Active Problem List   Diagnosis Date Noted   Shoulder arthritis 07/09/2024   Microalbuminuria due to type 2 diabetes mellitus (HCC) 06/14/2024   Skin tag 05/03/2024   Leucocytosis 03/19/2024   Neurogenic claudication due to lumbar spinal stenosis 03/07/2024   Preop examination 02/26/2024   Tachycardia 02/26/2024   Venous stasis ulcer, left lower extremity with lymphedema 02/07/2023   Insulin  dependent type 2 diabetes mellitus (HCC) 01/02/2023   Impingement syndrome, shoulder, right 10/15/2021   Olecranon bursitis, left elbow 09/08/2021   Annual physical exam 06/01/2021   Polyneuropathy associated with underlying disease (HCC) 05/08/2019   Chronic left shoulder pain 01/02/2019   Hyperlipidemia associated with type 2 diabetes mellitus (HCC) 09/03/2017   Uncontrolled type 2 diabetes mellitus with peripheral neuropathy 01/27/2016   Status post total right knee replacement 05/01/2015   Gout 09/13/2013   Chronic pain syndrome 09/13/2013   Primary osteoarthritis of left knee 09/13/2013   Morbid obesity (HCC) 09/13/2013   Seizures (HCC)    Hypertension    Past Medical History:  Diagnosis Date   Arthritis of left knee    Chronic back pain 09/13/2013   Chronic pain syndrome 09/13/2013   Diabetic ulcer of right ankle (HCC)    GERD (gastroesophageal reflux disease)  hx. of   Gout 09/13/2013   Hemorrhoids    Hypertension    Morbid obesity with body mass index of 45.0-49.9 in adult [facility]) 09/13/2013   Osteoarthritis 09/13/2013   Pneumonia    hx. of   Seizures (HCC)    Sleep apnea    has never used C-pap machine due to insurance cost   Type 2 diabetes mellitus, uncontrolled     Family History  Problem Relation Age of Onset   Alcohol abuse Father    Arthritis Maternal Grandmother    Alcohol abuse Paternal Grandmother    Alcohol abuse Paternal Grandfather     Diabetes Maternal Aunt     Past Surgical History:  Procedure Laterality Date   broken toe 35 years ago     COLONOSCOPY WITH PROPOFOL  N/A 12/15/2022   Procedure: COLONOSCOPY WITH PROPOFOL ;  Surgeon: Therisa Bi, MD;  Location: [facility] ENDOSCOPY;  Service: Gastroenterology;  Laterality: N/A;   IR EMBO ARTERIAL NOT HEMORR HEMANG INC GUIDE ROADMAPPING  04/27/2023   IR RADIOLOGIST EVAL & MGMT  03/29/2023   IR RADIOLOGIST EVAL & MGMT  08/02/2023   REVERSE SHOULDER ARTHROPLASTY Right 07/09/2024   Procedure: ARTHROPLASTY, SHOULDER, TOTAL, REVERSE;  Surgeon: [name], MD;  Location: [facility] OR;  Service: Orthopedics;  Laterality: Right;   TOTAL KNEE ARTHROPLASTY Right 05/01/2015   Procedure: RIGHT TOTAL KNEE ARTHROPLASTY;  Surgeon: Lonni CINDERELLA Poli, MD;  Location: [facility] ORS;  Service: Orthopedics;  Laterality: Right;   WISDOM TOOTH EXTRACTION     Social History   Occupational History   Occupation: Retired  Tobacco Use   Smoking status: Former    Current packs/day: 0.00    Average packs/day: 1 pack/day for 42.2 years (42.2 ttl pk-yrs)    Types: Cigarettes    Start date: 12/12/1961    Quit date: 03/08/2004    Years since quitting: 20.3   Smokeless tobacco: Never  Vaping Use   Vaping status: Never Used  Substance and Sexual Activity   Alcohol use: No    Comment: rare   Drug use: No   Sexual activity: Yes    Partners: Female

## 2024-07-24 NOTE — Progress Notes (Signed)
 SDW CALL  Patient was given pre-op instructions over the phone. The opportunity was given for the patient to ask questions. No further questions asked. Patient verbalized understanding of instructions given.   PCP - NO PCP Cardiologist -  Endocrinology- [name], MD   PPM/ICD - denies Device Orders - n/a Rep Notified - n/a  Chest x-ray - 06-16-24 EKG - 07-01-24 Stress Test - denies ECHO - 02-23-24 Cardiac Cath - denies  Sleep Study -  CPAP - does not use CPAP  Fasting Blood Sugar - per patient blood sugar ranges 121-155 Freestyle Libre left arm Last A1c on 05-03-24 6.0  Blood Thinner Instructions:denies Aspirin  Instructions: Last dose 07-24-24  ERAS Protcol - clear liquids until 2:00 PM. Patient may also have a lite breakfast before 7:30 am such toast and eggs or oatmeal with banana. Patient made aware not to have a large breakfast such as sausage biscuit.    COVID TEST- n/a   Anesthesia review: Yes, hx of HTN, Dm  Patient denies shortness of breath, fever, cough and chest pain over the phone call   All instructions explained to the patient, with a verbal understanding of the material. Patient agrees to go over the instructions while at home for a better understanding.

## 2024-07-25 ENCOUNTER — Encounter (HOSPITAL_COMMUNITY)
Admission: RE | Disposition: A | Discharge status: Home / Self Care | Payer: Self-pay | Source: Home / Self Care | Attending: Orthopedic Surgery

## 2024-07-25 ENCOUNTER — Ambulatory Visit: Admitting: Internal Medicine

## 2024-07-25 ENCOUNTER — Encounter (HOSPITAL_COMMUNITY): Payer: Self-pay | Admitting: Physician Assistant

## 2024-07-25 ENCOUNTER — Ambulatory Visit (HOSPITAL_COMMUNITY): Payer: Self-pay | Admitting: Physician Assistant

## 2024-07-25 ENCOUNTER — Encounter (HOSPITAL_COMMUNITY): Payer: Self-pay | Admitting: Orthopedic Surgery

## 2024-07-25 ENCOUNTER — Ambulatory Visit (HOSPITAL_COMMUNITY)

## 2024-07-25 ENCOUNTER — Inpatient Hospital Stay (HOSPITAL_COMMUNITY)
Admission: RE | Admit: 2024-07-25 | Discharge: 2024-07-27 | DRG: 483 | Disposition: A | Discharge status: Home / Self Care | Attending: Orthopedic Surgery | Admitting: Orthopedic Surgery

## 2024-07-25 DIAGNOSIS — M109 Gout, unspecified: Secondary | ICD-10-CM | POA: Diagnosis present

## 2024-07-25 DIAGNOSIS — Z833 Family history of diabetes mellitus: Secondary | ICD-10-CM

## 2024-07-25 DIAGNOSIS — Z880 Allergy status to penicillin: Secondary | ICD-10-CM | POA: Diagnosis not present

## 2024-07-25 DIAGNOSIS — M1712 Unilateral primary osteoarthritis, left knee: Secondary | ICD-10-CM | POA: Diagnosis present

## 2024-07-25 DIAGNOSIS — Z8261 Family history of arthritis: Secondary | ICD-10-CM

## 2024-07-25 DIAGNOSIS — T84028A Dislocation of other internal joint prosthesis, initial encounter: Secondary | ICD-10-CM | POA: Diagnosis not present

## 2024-07-25 DIAGNOSIS — M19011 Primary osteoarthritis, right shoulder: Secondary | ICD-10-CM | POA: Diagnosis not present

## 2024-07-25 DIAGNOSIS — I10 Essential (primary) hypertension: Secondary | ICD-10-CM | POA: Diagnosis present

## 2024-07-25 DIAGNOSIS — Z7982 Long term (current) use of aspirin: Secondary | ICD-10-CM | POA: Diagnosis not present

## 2024-07-25 DIAGNOSIS — I110 Hypertensive heart disease with heart failure: Secondary | ICD-10-CM

## 2024-07-25 DIAGNOSIS — K219 Gastro-esophageal reflux disease without esophagitis: Secondary | ICD-10-CM | POA: Diagnosis present

## 2024-07-25 DIAGNOSIS — T84028D Dislocation of other internal joint prosthesis, subsequent encounter: Secondary | ICD-10-CM

## 2024-07-25 DIAGNOSIS — Z96611 Presence of right artificial shoulder joint: Secondary | ICD-10-CM | POA: Diagnosis not present

## 2024-07-25 DIAGNOSIS — Z96619 Presence of unspecified artificial shoulder joint: Secondary | ICD-10-CM | POA: Diagnosis not present

## 2024-07-25 DIAGNOSIS — Z96651 Presence of right artificial knee joint: Secondary | ICD-10-CM | POA: Diagnosis present

## 2024-07-25 DIAGNOSIS — Z888 Allergy status to other drugs, medicaments and biological substances status: Secondary | ICD-10-CM | POA: Diagnosis not present

## 2024-07-25 DIAGNOSIS — Z6841 Body Mass Index (BMI) 40.0 and over, adult: Secondary | ICD-10-CM | POA: Diagnosis not present

## 2024-07-25 DIAGNOSIS — I509 Heart failure, unspecified: Secondary | ICD-10-CM

## 2024-07-25 DIAGNOSIS — M25311 Other instability, right shoulder: Secondary | ICD-10-CM | POA: Diagnosis present

## 2024-07-25 DIAGNOSIS — Z87891 Personal history of nicotine dependence: Secondary | ICD-10-CM | POA: Diagnosis not present

## 2024-07-25 DIAGNOSIS — Z7984 Long term (current) use of oral hypoglycemic drugs: Secondary | ICD-10-CM | POA: Diagnosis not present

## 2024-07-25 DIAGNOSIS — M7989 Other specified soft tissue disorders: Secondary | ICD-10-CM | POA: Diagnosis not present

## 2024-07-25 DIAGNOSIS — E119 Type 2 diabetes mellitus without complications: Secondary | ICD-10-CM | POA: Diagnosis present

## 2024-07-25 DIAGNOSIS — G894 Chronic pain syndrome: Secondary | ICD-10-CM | POA: Diagnosis present

## 2024-07-25 DIAGNOSIS — G8918 Other acute postprocedural pain: Secondary | ICD-10-CM | POA: Diagnosis not present

## 2024-07-25 DIAGNOSIS — Y792 Prosthetic and other implants, materials and accessory orthopedic devices associated with adverse incidents: Secondary | ICD-10-CM | POA: Diagnosis present

## 2024-07-25 DIAGNOSIS — Z9889 Other specified postprocedural states: Principal | ICD-10-CM

## 2024-07-25 DIAGNOSIS — M25511 Pain in right shoulder: Secondary | ICD-10-CM | POA: Diagnosis present

## 2024-07-25 HISTORY — PX: REVISION TOTAL SHOULDER TO REVERSE TOTAL SHOULDER: SHX6313

## 2024-07-25 LAB — GLUCOSE, CAPILLARY
Glucose-Capillary: 125 mg/dL — ABNORMAL HIGH (ref 70–99)
Glucose-Capillary: 100 mg/dL — ABNORMAL HIGH (ref 70–99)
Glucose-Capillary: 109 mg/dL — ABNORMAL HIGH (ref 70–99)

## 2024-07-25 SURGERY — REVISION, REVERSE TOTAL ARTHROPLASTY, SHOULDER
Anesthesia: Regional | Site: Shoulder | Laterality: Right

## 2024-07-25 MED ORDER — OXYCODONE HCL 5 MG PO TABS
5.0000 mg | ORAL_TABLET | ORAL | Status: DC | PRN
  Administered 2024-07-25 – 2024-07-27 (×8): 10 mg via ORAL
  Filled 2024-07-25 (×7): qty 2

## 2024-07-25 MED ORDER — BUPIVACAINE LIPOSOME 1.3 % IJ SUSP
INTRAMUSCULAR | Status: DC | PRN
  Administered 2024-07-25: 10 mL via PERINEURAL

## 2024-07-25 MED ORDER — PROPOFOL 10 MG/ML IV BOLUS
INTRAVENOUS | Status: DC | PRN
  Administered 2024-07-25: 150 mg via INTRAVENOUS

## 2024-07-25 MED ORDER — ACETAMINOPHEN 10 MG/ML IV SOLN: 1000.0000 mg | Freq: Once | INTRAVENOUS | Status: DC | PRN

## 2024-07-25 MED ORDER — METFORMIN HCL 500 MG PO TABS
1000.0000 mg | ORAL_TABLET | Freq: Two times a day (BID) | ORAL | Status: DC
  Administered 2024-07-26 – 2024-07-27 (×3): 1000 mg via ORAL
  Filled 2024-07-25 (×3): qty 2

## 2024-07-25 MED ORDER — ONDANSETRON HCL 4 MG/2ML IJ SOLN
INTRAMUSCULAR | Status: DC | PRN
  Administered 2024-07-25: 4 mg via INTRAVENOUS

## 2024-07-25 MED ORDER — IRRISEPT - 450ML BOTTLE WITH 0.05% CHG IN STERILE WATER, USP 99.95% OPTIME
TOPICAL | Status: DC | PRN
  Administered 2024-07-25: 450 mL

## 2024-07-25 MED ORDER — OXYCODONE HCL 5 MG PO TABS
ORAL_TABLET | ORAL | Status: AC
  Filled 2024-07-25: qty 2

## 2024-07-25 MED ORDER — FENTANYL CITRATE PF 50 MCG/ML IJ SOSY
50.0000 ug | PREFILLED_SYRINGE | Freq: Once | INTRAMUSCULAR | Status: AC
  Administered 2024-07-25: 50 ug via INTRAVENOUS

## 2024-07-25 MED ORDER — AMISULPRIDE (ANTIEMETIC) 5 MG/2ML IV SOLN: 10.0000 mg | Freq: Once | INTRAVENOUS | Status: DC | PRN

## 2024-07-25 MED ORDER — CEFAZOLIN SODIUM-DEXTROSE 3-4 GM/150ML-% IV SOLN
3.0000 g | INTRAVENOUS | Status: AC
  Administered 2024-07-25: 3 g via INTRAVENOUS

## 2024-07-25 MED ORDER — LACTATED RINGERS IV SOLN: INTRAVENOUS | Status: DC

## 2024-07-25 MED ORDER — ASPIRIN 81 MG PO TBEC
81.0000 mg | DELAYED_RELEASE_TABLET | Freq: Every day | ORAL | Status: DC
  Administered 2024-07-26: 81 mg via ORAL
  Filled 2024-07-25: qty 1

## 2024-07-25 MED ORDER — ORAL CARE MOUTH RINSE: 15.0000 mL | Freq: Once | OROMUCOSAL | Status: AC

## 2024-07-25 MED ORDER — PHENYLEPHRINE HCL-NACL 20-0.9 MG/250ML-% IV SOLN
INTRAVENOUS | Status: DC | PRN
  Administered 2024-07-25: 50 ug/min via INTRAVENOUS

## 2024-07-25 MED ORDER — ACETAMINOPHEN 500 MG PO TABS
1000.0000 mg | ORAL_TABLET | Freq: Four times a day (QID) | ORAL | Status: AC
  Administered 2024-07-25 – 2024-07-26 (×4): 1000 mg via ORAL
  Filled 2024-07-25 (×4): qty 2

## 2024-07-25 MED ORDER — SUGAMMADEX SODIUM 200 MG/2ML IV SOLN
INTRAVENOUS | Status: DC | PRN
  Administered 2024-07-25: 200 mg via INTRAVENOUS

## 2024-07-25 MED ORDER — ROCURONIUM BROMIDE 10 MG/ML (PF) SYRINGE
PREFILLED_SYRINGE | INTRAVENOUS | Status: DC | PRN
  Administered 2024-07-25: 40 mg via INTRAVENOUS

## 2024-07-25 MED ORDER — FENTANYL CITRATE (PF) 100 MCG/2ML IJ SOLN: 25.0000 ug | INTRAMUSCULAR | Status: DC | PRN

## 2024-07-25 MED ORDER — METOCLOPRAMIDE HCL 5 MG/ML IJ SOLN: 5.0000 mg | Freq: Three times a day (TID) | INTRAMUSCULAR | Status: DC | PRN

## 2024-07-25 MED ORDER — MENTHOL 3 MG MT LOZG: 1.0000 | LOZENGE | OROMUCOSAL | Status: DC | PRN

## 2024-07-25 MED ORDER — DOCUSATE SODIUM 100 MG PO CAPS
100.0000 mg | ORAL_CAPSULE | Freq: Two times a day (BID) | ORAL | Status: DC
  Administered 2024-07-25 – 2024-07-26 (×3): 100 mg via ORAL
  Filled 2024-07-25 (×3): qty 1

## 2024-07-25 MED ORDER — ONDANSETRON HCL 4 MG PO TABS: 4.0000 mg | ORAL_TABLET | Freq: Four times a day (QID) | ORAL | Status: DC | PRN

## 2024-07-25 MED ORDER — ACETAMINOPHEN 325 MG PO TABS: 325.0000 mg | ORAL_TABLET | Freq: Four times a day (QID) | ORAL | Status: DC | PRN

## 2024-07-25 MED ORDER — ONDANSETRON HCL 4 MG/2ML IJ SOLN: 4.0000 mg | Freq: Once | INTRAMUSCULAR | Status: DC | PRN

## 2024-07-25 MED ORDER — METOCLOPRAMIDE HCL 5 MG PO TABS: 5.0000 mg | ORAL_TABLET | Freq: Three times a day (TID) | ORAL | Status: DC | PRN

## 2024-07-25 MED ORDER — CEFAZOLIN SODIUM-DEXTROSE 3-4 GM/150ML-% IV SOLN
INTRAVENOUS | Status: AC
  Filled 2024-07-25: qty 150

## 2024-07-25 MED ORDER — PHENYLEPHRINE 80 MCG/ML (10ML) SYRINGE FOR IV PUSH (FOR BLOOD PRESSURE SUPPORT)
PREFILLED_SYRINGE | INTRAVENOUS | Status: DC | PRN
  Administered 2024-07-25 (×2): 160 ug via INTRAVENOUS
  Administered 2024-07-25 (×3): 80 ug via INTRAVENOUS
  Administered 2024-07-25 (×6): 160 ug via INTRAVENOUS

## 2024-07-25 MED ORDER — HYDROMORPHONE HCL 1 MG/ML IJ SOLN
0.5000 mg | INTRAMUSCULAR | Status: DC | PRN
  Administered 2024-07-25: 0.5 mg via INTRAVENOUS
  Filled 2024-07-25: qty 0.5

## 2024-07-25 MED ORDER — PHENOL 1.4 % MT LIQD: 1.0000 | OROMUCOSAL | Status: DC | PRN

## 2024-07-25 MED ORDER — BUPIVACAINE HCL (PF) 0.5 % IJ SOLN
INTRAMUSCULAR | Status: DC | PRN
Start: 2024-07-25 — End: 2024-07-25
  Administered 2024-07-25: 15 mL via PERINEURAL

## 2024-07-25 MED ORDER — FENTANYL CITRATE (PF) 250 MCG/5ML IJ SOLN
INTRAMUSCULAR | Status: DC | PRN
  Administered 2024-07-25: 100 ug via INTRAVENOUS
  Administered 2024-07-25: 25 ug via INTRAVENOUS

## 2024-07-25 MED ORDER — POVIDONE-IODINE 10 % EX SWAB
2.0000 | Freq: Once | CUTANEOUS | Status: AC
  Administered 2024-07-25: 2 via TOPICAL

## 2024-07-25 MED ORDER — METHOCARBAMOL 1000 MG/10ML IJ SOLN: 500.0000 mg | Freq: Four times a day (QID) | INTRAMUSCULAR | Status: DC | PRN

## 2024-07-25 MED ORDER — FENTANYL CITRATE (PF) 100 MCG/2ML IJ SOLN
INTRAMUSCULAR | Status: AC
  Filled 2024-07-25: qty 2

## 2024-07-25 MED ORDER — SUCCINYLCHOLINE CHLORIDE 200 MG/10ML IV SOSY
PREFILLED_SYRINGE | INTRAVENOUS | Status: DC | PRN
Start: 2024-07-25 — End: 2024-07-25
  Administered 2024-07-25: 120 mg via INTRAVENOUS

## 2024-07-25 MED ORDER — 0.9 % SODIUM CHLORIDE (POUR BTL) OPTIME
TOPICAL | Status: DC | PRN
  Administered 2024-07-25: 4000 mL
  Administered 2024-07-25: 1000 mL

## 2024-07-25 MED ORDER — VANCOMYCIN HCL 1000 MG IV SOLR
INTRAVENOUS | Status: AC
Start: 2024-07-25 — End: 2024-07-25
  Filled 2024-07-25: qty 20

## 2024-07-25 MED ORDER — VANCOMYCIN HCL 1000 MG IV SOLR
INTRAVENOUS | Status: DC | PRN
  Administered 2024-07-25: 1000 mg via TOPICAL

## 2024-07-25 MED ORDER — CEFAZOLIN SODIUM-DEXTROSE 2-4 GM/100ML-% IV SOLN
2.0000 g | Freq: Three times a day (TID) | INTRAVENOUS | Status: AC
  Administered 2024-07-25 – 2024-07-26 (×3): 2 g via INTRAVENOUS
  Filled 2024-07-25 (×3): qty 100

## 2024-07-25 MED ORDER — METHOCARBAMOL 500 MG PO TABS
500.0000 mg | ORAL_TABLET | Freq: Four times a day (QID) | ORAL | Status: DC | PRN
  Administered 2024-07-25 – 2024-07-26 (×3): 500 mg via ORAL
  Filled 2024-07-25 (×3): qty 1

## 2024-07-25 MED ORDER — CHLORHEXIDINE GLUCONATE 0.12 % MT SOLN
15.0000 mL | Freq: Once | OROMUCOSAL | Status: AC
  Administered 2024-07-25: 15 mL via OROMUCOSAL

## 2024-07-25 MED ORDER — POVIDONE-IODINE 7.5 % EX SOLN
Freq: Once | CUTANEOUS | Status: AC
  Filled 2024-07-25: qty 118

## 2024-07-25 MED ORDER — SODIUM CHLORIDE 0.9 % IV SOLN: INTRAVENOUS | Status: AC

## 2024-07-25 SURGICAL SUPPLY — 36 items
ALCOHOL 70% 16 OZ (MISCELLANEOUS) ×2 IMPLANT
BAG COUNTER SPONGE SURGICOUNT (BAG) ×2 IMPLANT
BEARING HUMERAL SHOULDER 40 +3 (Joint) IMPLANT
CHLORAPREP W/TINT 26 (MISCELLANEOUS) ×2 IMPLANT
COVER SURGICAL LIGHT HANDLE (MISCELLANEOUS) ×2 IMPLANT
DRAPE INCISE IOBAN 66X45 STRL (DRAPES) ×2 IMPLANT
DRAPE SURG ORHT 6 SPLT 77X108 (DRAPES) ×4 IMPLANT
DRAPE U-SHAPE 47X51 STRL (DRAPES) ×4 IMPLANT
DRESSING PEEL AND PLAC PRVNA20 (GAUZE/BANDAGES/DRESSINGS) IMPLANT
ELECTRODE BLDE 4.0 EZ CLN MEGD (MISCELLANEOUS) ×2 IMPLANT
ELECTRODE REM PT RTRN 9FT ADLT (ELECTROSURGICAL) ×2 IMPLANT
GAUZE SPONGE 4X4 12PLY STRL LF (GAUZE/BANDAGES/DRESSINGS) ×2 IMPLANT
GLOVE BIOGEL PI IND STRL 6.5 (GLOVE) ×2 IMPLANT
GLOVE BIOGEL PI IND STRL 8 (GLOVE) ×2 IMPLANT
GLOVE ECLIPSE 6.5 STRL STRAW (GLOVE) ×2 IMPLANT
GLOVE ECLIPSE 8.0 STRL XLNG CF (GLOVE) ×2 IMPLANT
GOWN STRL REUS W/ TWL LRG LVL3 (GOWN DISPOSABLE) ×2 IMPLANT
GOWN STRL REUS W/ TWL XL LVL3 (GOWN DISPOSABLE) ×2 IMPLANT
HYDROGEN PEROXIDE 16OZ (MISCELLANEOUS) ×2 IMPLANT
KIT BASIN OR (CUSTOM PROCEDURE TRAY) ×2 IMPLANT
KIT TURNOVER KIT B (KITS) ×2 IMPLANT
LAVAGE JET IRRISEPT WOUND (IRRIGATION / IRRIGATOR) ×2 IMPLANT
MANIFOLD NEPTUNE II (INSTRUMENTS) ×2 IMPLANT
NS IRRIG 1000ML POUR BTL (IV SOLUTION) ×2 IMPLANT
PACK SHOULDER (CUSTOM PROCEDURE TRAY) ×2 IMPLANT
RESTRAINT HEAD UNIVERSAL NS (MISCELLANEOUS) ×2 IMPLANT
SOL PREP POV-IOD 4OZ 10% (MISCELLANEOUS) ×2 IMPLANT
SUCTION TUBE FRAZIER 10FR DISP (SUCTIONS) ×2 IMPLANT
SUT ETHILON 3 0 PS 1 (SUTURE) IMPLANT
SUT SILK 2 0 TIES 10X30 (SUTURE) ×2 IMPLANT
SUT VIC AB 0 CT1 27XBRD ANBCTR (SUTURE) ×8 IMPLANT
SUT VIC AB 1 CT1 36 (SUTURE) IMPLANT
SUT VICRYL 0 UR6 27IN ABS (SUTURE) ×4 IMPLANT
TOWEL GREEN STERILE (TOWEL DISPOSABLE) ×2 IMPLANT
TRAY HUMERAL STD +10 (Shoulder) IMPLANT
WATER STERILE IRR 1000ML POUR (IV SOLUTION) ×2 IMPLANT

## 2024-07-25 NOTE — Addendum Note (Signed)
 Addendum  created 07/25/24 2140 by Lylia Schuyler BROCKS, CRNA   Intraprocedure Event edited

## 2024-07-25 NOTE — Anesthesia Procedure Notes (Signed)
 Procedure Name: Intubation Date/Time: 07/25/2024 5:58 PM  Performed by: Harrold Macintosh, CRNAPre-anesthesia Checklist: Patient identified, Emergency Drugs available, Suction available and Patient being monitored Patient Re-evaluated:Patient Re-evaluated prior to induction Oxygen Delivery Method: Circle system utilized Preoxygenation: Pre-oxygenation with 100% oxygen Induction Type: IV induction Ventilation: Mask ventilation without difficulty Laryngoscope Size: Glidescope and 4 Grade View: Grade I Tube type: Oral Tube size: 7.5 mm Number of attempts: 1 Airway Equipment and Method: Bite block, Rigid stylet and Video-laryngoscopy Placement Confirmation: ETT inserted through vocal cords under direct vision, positive ETCO2 and breath sounds checked- equal and bilateral Secured at: 23 cm Tube secured with: Tape Dental Injury: Teeth and Oropharynx as per pre-operative assessment

## 2024-07-25 NOTE — H&P (Signed)
 Christian [name] is an 73 y.o. male.   Chief Complaint: Right shoulder pain HPI: Christian [name] is a 73 year old patient who is now 2 weeks out from right reverse shoulder replacement.  Did well in the hospital.  Postop radiographs look good.  At some point between his discharge in his clinic visit yesterday his shoulder dislocated.  He is not exactly sure of the event or time.  He states that his shoulder is hurting him.  At the time of surgery we did use the glenosphere with the most lateral offset as well as the humeral stem with the most lateral offset to counteract his laxity.  He had no rotator cuff attachment on the head at the time of his surgery.  Also notably he had native right shoulder atraumatic instability 1 week prior to surgery.  He presents now for reduction and change out from standard liner to constrained liner which adds 5 mm of additional constraint to the construct.  Past Medical History:  Diagnosis Date   Arthritis of left knee    Chronic back pain 09/13/2013   Chronic pain syndrome 09/13/2013   Diabetic ulcer of right ankle (HCC)    GERD (gastroesophageal reflux disease)    hx. of   Gout 09/13/2013   Hemorrhoids    Hypertension    Morbid obesity with body mass index of 45.0-49.9 in adult [facility]) 09/13/2013   Osteoarthritis 09/13/2013   Pneumonia    hx. of   Seizures (HCC)    Sleep apnea    has never used C-pap machine due to insurance cost   Type 2 diabetes mellitus, uncontrolled     Past Surgical History:  Procedure Laterality Date   broken toe 35 years ago     COLONOSCOPY WITH PROPOFOL  N/A 12/15/2022   Procedure: COLONOSCOPY WITH PROPOFOL ;  Surgeon: Therisa Bi, MD;  Location: [facility] ENDOSCOPY;  Service: Gastroenterology;  Laterality: N/A;   IR EMBO ARTERIAL NOT HEMORR HEMANG INC GUIDE ROADMAPPING  04/27/2023   IR RADIOLOGIST EVAL & MGMT  03/29/2023   IR RADIOLOGIST EVAL & MGMT  08/02/2023   REVERSE SHOULDER ARTHROPLASTY Right 07/09/2024   Procedure: ARTHROPLASTY, SHOULDER,  TOTAL, REVERSE;  Surgeon: [name], MD;  Location: [facility] OR;  Service: Orthopedics;  Laterality: Right;   TOTAL KNEE ARTHROPLASTY Right 05/01/2015   Procedure: RIGHT TOTAL KNEE ARTHROPLASTY;  Surgeon: Lonni CINDERELLA Poli, MD;  Location: [facility] ORS;  Service: Orthopedics;  Laterality: Right;   WISDOM TOOTH EXTRACTION      Family History  Problem Relation Age of Onset   Alcohol abuse Father    Arthritis Maternal Grandmother    Alcohol abuse Paternal Grandmother    Alcohol abuse Paternal Grandfather    Diabetes Maternal Aunt    Social History:  reports that he quit smoking about 20 years ago. His smoking use included cigarettes. He started smoking about 62 years ago. He has a 42.2 pack-year smoking history. He has never used smokeless tobacco. He reports that he does not drink alcohol and does not use drugs.  Allergies:  Allergies  Allergen Reactions   Jardiance  [Empagliflozin ]     Yeast infections   Metoprolol  Other (See Comments)    Caused blood sugars to be elevated and not controlled   Statins     Muscle aches, joint pain   Penicillins Rash    Medications Prior to Admission  Medication Sig Dispense Refill   acetaminophen  (TYLENOL ) 325 MG tablet Take 1-2 tablets (325-650 mg total) by mouth every 6 (six) hours  as needed for mild pain (pain score 1-3) or fever (or temp > 100.5). 60 tablet 0   diclofenac  (VOLTAREN ) 75 MG EC tablet Take 75 mg by mouth 2 (two) times daily.     insulin  regular human CONCENTRATED (HUMULIN  R U-500 KWIKPEN) 500 UNIT/ML KwikPen INJECT 200 UNITS UNDER SKIN A DAY AS ADVISED (Patient taking differently: Inject 50 Units into the skin in the morning. Per patient only taking 50 units in am) 24 mL 0   metFORMIN  (GLUCOPHAGE ) 1000 MG tablet TAKE 1 TABLET BY MOUTH TWICE  DAILY WITH A MEAL 180 tablet 3   Multiple Vitamin (MULTIVITAMIN WITH MINERALS) TABS tablet Take 1 tablet by mouth daily with lunch. (Patient taking differently: Take 1 tablet by mouth in the  morning.)     oxyCODONE  (OXY IR/ROXICODONE ) 5 MG immediate release tablet Take 1-2 tablets (5-10 mg total) by mouth every 4 (four) hours as needed for moderate pain (pain score 4-6) (pain score 4-6). 30 tablet 0   traMADol  (ULTRAM ) 50 MG tablet Take 1 tablet (50 mg total) by mouth every 12 (twelve) hours as needed. 30 tablet 0   aspirin  EC 81 MG tablet Take 1 tablet (81 mg total) by mouth daily. Swallow whole. 30 tablet 12   BD PEN NEEDLE NANO 2ND GEN 32G X 4 MM MISC USE AS DIRECTED 5 TIMES A DAY WITH INSULIN  INJECTIONS 200 each 5   Continuous Glucose Sensor (FREESTYLE LIBRE 3 SENSOR) MISC by Does not apply route. Use to check glucose continuously change every 14 days     docusate sodium  (COLACE) 100 MG capsule Take 1 capsule (100 mg total) by mouth 2 (two) times daily. 10 capsule 0   tiZANidine  (ZANAFLEX ) 2 MG tablet Take 1 tablet (2 mg total) by mouth every 8 (eight) hours as needed for muscle spasms. 30 tablet 0    Results for orders placed or performed during the hospital encounter of 07/25/24 (from the past 48 hours)  Glucose, capillary     Status: Abnormal   Collection Time: 07/25/24  2:57 PM  Result Value Ref Range   Glucose-Capillary 125 (H) 70 - 99 mg/dL    Comment: Glucose reference range applies only to samples taken after fasting for at least 8 hours.   XR Shoulder Right Result Date: 07/24/2024 Multiple views right shoulder reviewed.  There has been anterior dislocation of reverse shoulder prosthesis compared to immediate postoperative radiographs 2 weeks ago.  No acute fracture.   Review of Systems  Musculoskeletal:  Positive for arthralgias.  All other systems reviewed and are negative.   Blood pressure (!) 112/95, pulse 79, temperature 98.6 F (37 C), temperature source Oral, resp. rate 16, height 5' 6 (1.676 m), weight 122.5 kg, SpO2 95%. Physical Exam Vitals reviewed.  HENT:     Head: Normocephalic.     Nose: Nose normal.     Mouth/Throat:     Mouth: Mucous  membranes are moist.  Eyes:     Pupils: Pupils are equal, round, and reactive to light.  Cardiovascular:     Rate and Rhythm: Normal rate.     Pulses: Normal pulses.  Pulmonary:     Effort: Pulmonary effort is normal.  Abdominal:     General: Abdomen is flat.  Musculoskeletal:     Cervical back: Normal range of motion.  Skin:    General: Skin is warm.     Capillary Refill: Capillary refill takes less than 2 seconds.  Neurological:     General: No focal  deficit present.     Mental Status: He is alert.  Psychiatric:        Mood and Affect: Mood normal.   Right shoulder exam demonstrates well-healed surgical incision.  No paresthesias in either deltoid region but it is difficult to assess whether the deltoid is functional based on his prosthetic dislocation.  Motor or sensory function to the right hand is intact.  Radial pulses intact.  Does have anterior fullness.  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  Plan is open reduction with poly exchange on the humeral side.  Small chance that we would have to do [facility] system if we cannot achieve stability.  Stability was good at the initial procedure; however, the patient has had a dislocation event under unclear circumstances.  We will attempt to stabilize the shoulder and keep him in a sling for at least 3 to 4 weeks after this surgery.  Risk and benefits are discussed with the patient include not limited to infection nerve vessel damage incomplete restoration of function as well as recurrent instability.  Patient understands and wishes to proceed.  Plan to use likely drain as well as incisional wound VAC to facilitate healing.  All questions answered.  KANDICE Glendia Hutchinson, MD 07/25/2024, 4:53 PM

## 2024-07-25 NOTE — Anesthesia Postprocedure Evaluation (Signed)
 Anesthesia Post Note  Patient: Christian [name]  Procedure(s) Performed: REVISION, REVERSE TOTAL ARTHROPLASTY, SHOULDER (Right: Shoulder)     Patient location during evaluation: PACU Anesthesia Type: Regional and General Level of consciousness: awake and alert Pain management: pain level controlled Vital Signs Assessment: post-procedure vital signs reviewed and stable Respiratory status: spontaneous breathing, nonlabored ventilation, respiratory function stable and patient connected to nasal cannula oxygen Cardiovascular status: blood pressure returned to baseline and stable Postop Assessment: no apparent nausea or vomiting Anesthetic complications: no   No notable events documented.  Last Vitals:  Vitals:   07/25/24 2030 07/25/24 2105  BP: 123/73 127/78  Pulse: 92 (!) 102  Resp: 13 18  Temp: 36.4 C 36.5 C  SpO2: 94% 96%    Last Pain:  Vitals:   07/25/24 2105  TempSrc: Oral  PainSc:                  Miku Udall,W. EDMOND

## 2024-07-25 NOTE — Brief Op Note (Signed)
   07/25/2024  8:04 PM  PATIENT:  Christian [name]  73 y.o. male  PRE-OPERATIVE DIAGNOSIS:  rigth rsa shoulder dislocation  POST-OPERATIVE DIAGNOSIS:  rigth rsa shoulder dislocation  PROCEDURE:  Procedure(s): REVISION, REVERSE TOTAL ARTHROPLASTY, SHOULDER  SURGEON:  Surgeon(s): Addie, Cordella Hamilton, MD  ASSISTANT: magnant pa  ANESTHESIA:   general  EBL: 50 ml    Total I/O In: -  Out: 500 [Urine:500]  BLOOD ADMINISTERED: none  DRAINS: none   LOCAL MEDICATIONS USED:  vanco  SPECIMEN:  No Specimen  COUNTS:  YES  TOURNIQUET:  * No tourniquets in log *  DICTATION: .Other Dictation: Dictation Number [id number]  PLAN OF CARE: admit overnight PATIENT DISPOSITION:  PACU - hemodynamically stable

## 2024-07-25 NOTE — Anesthesia Preprocedure Evaluation (Addendum)
 Anesthesia Evaluation  Patient identified by MRN, date of birth, ID band Patient awake    Reviewed: Allergy & Precautions, NPO status , Patient's Chart, lab work & pertinent test results  Airway Mallampati: II  TM Distance: >3 FB Neck ROM: Full    Dental no notable dental hx.    Pulmonary sleep apnea , former smoker   Pulmonary exam normal        Cardiovascular hypertension, +CHF  Normal cardiovascular exam     Neuro/Psych Seizures -,   Neuromuscular disease  negative psych ROS   GI/Hepatic negative GI ROS,,,(+)     substance abuse (oxy prn)    Endo/Other  diabetes, Insulin  Dependent, Oral Hypoglycemic Agents  Class 3 obesity  Renal/GU      Musculoskeletal  (+) Arthritis ,  narcotic dependentChronic back pain Chronic pain syndrome Ambulates with a cane   Abdominal  (+) + obese  Peds  Hematology   Anesthesia Other Findings right shoulder dislocation  Reproductive/Obstetrics                              Anesthesia Physical Anesthesia Plan  ASA: 3  Anesthesia Plan: General and Regional   Post-op Pain Management: Regional block*   Induction: Intravenous  PONV Risk Score and Plan: 2 and Ondansetron , Dexamethasone  and Treatment may vary due to age or medical condition  Airway Management Planned: Oral ETT  Additional Equipment: ClearSight  Intra-op Plan:   Post-operative Plan: Extubation in OR  Informed Consent: I have reviewed the patients History and Physical, chart, labs and discussed the procedure including the risks, benefits and alternatives for the proposed anesthesia with the patient or authorized representative who has indicated his/her understanding and acceptance.     Dental advisory given  Plan Discussed with: CRNA  Anesthesia Plan Comments:          Anesthesia Quick Evaluation

## 2024-07-25 NOTE — Op Note (Signed)
 NAME: Christian [name], Christian [name] MEDICAL RECORD NO: [id number] ACCOUNT NO: [id number] DATE OF BIRTH: 08-27-1951 FACILITY: [facility] LOCATION: [facility] PHYSICIAN: Cordella RAMAN. 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  PROCEDURE: Revision right reverse shoulder replacement from standard liner with +10 humeral tray to +3 retentive poly with +10 humeral tray with 0 offset.  SURGEON: Cordella RAMAN. Addie, MD.  ASSISTANT: Herlene Calix.  INDICATIONS: The patient is a 73 year old patient with right reverse shoulder replacement placed 2 weeks ago. The patient had no rotator cuff attachment to the humeral head at the time of surgery and also had right shoulder atraumatic instability about 1  week before surgery. He presents now for management of right reverse shoulder replacement, anterior instability and dislocation.  DESCRIPTION OF PROCEDURE: The patient was brought to the operating room where general anesthetic was induced. Preop antibiotics were administered. Timeout was called. The right shoulder was prescrubbed with hydrogen peroxide followed by alcohol and  Betadine , which was allowed to air dry and then prepped with ChloraPrep solution and draped in a sterile manner. Ioban was used to cover the field. A timeout was called. Prior incision was utilized. Skin and subcutaneous tissue were sharply divided. The  deltopectoral approach had closed and was sealed nicely. This was opened back up again. There was some hematoma present. We did evacuate the hematoma and irrigated out the shoulder joint with Irrisept solution. At this time, using a Brown retractor,  subdeltoid adhesions were released. There were not many adhesions because of the absence of the rotator cuff. Next, the tray was removed. Both components on the humeral  and glenoid side were well fixed. We did trial reduction with a +3 retentive poly,  which added 5 more mm of lateralization to the construct with the +10 humeral tray with +0 offset. This gave excellent stability. Difficult to reduce, difficult to re-dislocate. Required a shoehorn plus maximal distraction, maximal force with essentially  all 4 fingers on the tuberosity and tray. The shoulder with the trial implant was stable with adduction and extension along with the forward force as well as with internal and external rotation at both 15 degrees, 30 degrees, 60 degrees, and 45 degrees  as well as with internal and external rotation above 90 degrees of abduction. Throughout this range of motion, there was no lift-off or distraction. With maximum inferior pull, the shoulder was also stable. With some force, the shoulder was re-dislocated  and the true components were placed. A loud pop was present with relocation of the implant. 3 liters of pouring irrigation was then utilized. Vancomycin  powder was placed. The deltopectoral interval was closed using #1 Vicryl suture followed by  interrupted inverted 0 Vicryl suture, 2-0 Vicryl suture, and 3-0 nylon with incisional wound VAC applied. The patient was then placed in a shoulder immobilizer. He tolerated the procedure well without immediate complications. Luke's assistance was  required for mobilization of tissue opening and closing and reduction. His assistance was of medical necessity.    SUJ D: 07/25/2024 8:09:22 pm T: 07/25/2024 11:48:00 pm  JOB: [id number]/ [id number]

## 2024-07-25 NOTE — Anesthesia Procedure Notes (Signed)
 Anesthesia Regional Block: Interscalene brachial plexus block   Pre-Anesthetic Checklist: , timeout performed,  Correct Patient, Correct Site, Correct Laterality,  Correct Procedure, Correct Position, site marked,  Risks and benefits discussed,  Surgical consent,  Pre-op evaluation,  At surgeon's request and post-op pain management  Laterality: Right  Prep: chloraprep       Needles:  Injection technique: Single-shot  Needle Type: Echogenic Stimulator Needle     Needle Length: 9cm  Needle Gauge: 21     Additional Needles:   Procedures:,,,, ultrasound used (permanent image in chart),,    Narrative:  Start time: 07/25/2024 4:40 PM End time: 07/25/2024 4:50 PM Injection made incrementally with aspirations every 5 mL.  Performed by: Personally  Anesthesiologist: Patrisha Bernardino SQUIBB, MD  Additional Notes: Functioning IV was confirmed and monitors were applied.  A timeout was performed. Sterile prep, hand hygiene and sterile gloves were used. A 90mm 21ga Arrow echogenic stimulator needle was used. Negative aspiration and negative test dose prior to incremental administration of local anesthetic. The patient tolerated the procedure well.  Ultrasound guidance: relevent anatomy identified, needle position confirmed, local anesthetic spread visualized around nerve(s), vascular puncture avoided.  Image printed for medical record.

## 2024-07-25 NOTE — Transfer of Care (Signed)
 Immediate Anesthesia Transfer of Care Note  Patient: Christian [name]  Procedure(s) Performed: REVISION, REVERSE TOTAL ARTHROPLASTY, SHOULDER (Right: Shoulder)  Patient Location: PACU  Anesthesia Type:General  Level of Consciousness: awake, alert , and oriented  Airway & Oxygen Therapy: Patient Spontanous Breathing  Post-op Assessment: Report given to RN and Post -op Vital signs reviewed and stable  Post vital signs: Reviewed and stable  Last Vitals:  Vitals Value Taken Time  BP 140/78 07/25/24 20:00  Temp 36.7 C 07/25/24 20:00  Pulse 87 07/25/24 20:06  Resp 17 07/25/24 20:06  SpO2 93 % 07/25/24 20:06  Vitals shown include unfiled device data.  Last Pain:  Vitals:   07/25/24 2000  TempSrc:   PainSc: 0-No pain         Complications: No notable events documented.

## 2024-07-26 ENCOUNTER — Encounter (HOSPITAL_COMMUNITY): Payer: Self-pay | Admitting: Orthopedic Surgery

## 2024-07-26 ENCOUNTER — Other Ambulatory Visit: Payer: Self-pay

## 2024-07-26 LAB — GLUCOSE, CAPILLARY
Glucose-Capillary: 237 mg/dL — ABNORMAL HIGH (ref 70–99)
Glucose-Capillary: 231 mg/dL — ABNORMAL HIGH (ref 70–99)
Glucose-Capillary: 254 mg/dL — ABNORMAL HIGH (ref 70–99)
Glucose-Capillary: 181 mg/dL — ABNORMAL HIGH (ref 70–99)

## 2024-07-26 MED ORDER — INSULIN ASPART 100 UNIT/ML IJ SOLN
4.0000 [IU] | Freq: Three times a day (TID) | INTRAMUSCULAR | Status: DC
  Administered 2024-07-26 – 2024-07-27 (×2): 4 [IU] via SUBCUTANEOUS

## 2024-07-26 MED ORDER — INSULIN ASPART 100 UNIT/ML IJ SOLN
0.0000 [IU] | Freq: Three times a day (TID) | INTRAMUSCULAR | Status: DC
  Administered 2024-07-26: 8 [IU] via SUBCUTANEOUS
  Administered 2024-07-27: 2 [IU] via SUBCUTANEOUS

## 2024-07-26 MED ORDER — INSULIN ASPART 100 UNIT/ML IJ SOLN: 0.0000 [IU] | Freq: Every day | INTRAMUSCULAR | Status: DC

## 2024-07-26 NOTE — Evaluation (Signed)
 Occupational Therapy Evaluation Patient Details Name: Christian [name] MRN: [id number] DOB: October 15, 1951 Today's Date: 07/26/2024   History of Present Illness   Pt is a 72 y/o male admitted for R revision of reverse shoulder replacement in setting of rotator cuff tear and arthritis. PMH: L knee OA, GERD, HTN, obesity, gout, seizures, DM1, hx of back surgery April 2025     Clinical Impressions S/p procedure above.  Patient is well versed  in TSA precautions.  Ordered in sling at all times and AROM to hand and wrist only.  Up and moving with up to CGA due to premorbid balance deficits.  Spouse will be assisting with ADL and toileting as needed.  No further OT needs in the acute setting.  Recommend follow up as prescribed by MD.       If plan is discharge home, recommend the following:   A lot of help with bathing/dressing/bathroom;Assistance with cooking/housework;Assist for transportation     Functional Status Assessment         Equipment Recommendations   None recommended by OT     Recommendations for Other Services         Precautions/Restrictions   Precautions Precautions: Fall;Other (comment);Shoulder Type of Shoulder Precautions: no AROM of shoulder. ok for hand and wrist AROM only Shoulder Interventions: Shoulder sling/immobilizer;At all times Precaution Booklet Issued: No Recall of Precautions/Restrictions: Intact Required Braces or Orthoses: Sling Restrictions Weight Bearing Restrictions Per Provider Order: Yes RUE Weight Bearing Per Provider Order: Non weight bearing     Mobility Bed Mobility Overal bed mobility: Needs Assistance Bed Mobility: Supine to Sit     Supine to sit: Min assist, HOB elevated          Transfers Overall transfer level: Needs assistance Equipment used: Straight cane Transfers: Sit to/from Stand Sit to Stand: Contact guard assist                  Balance Overall balance assessment: Needs  assistance Sitting-balance support: No upper extremity supported, Feet supported Sitting balance-Leahy Scale: Good     Standing balance support: During functional activity, Single extremity supported, Reliant on assistive device for balance Standing balance-Leahy Scale: Fair                             ADL either performed or assessed with clinical judgement   ADL   Eating/Feeding: Set up;Sitting   Grooming: Set up;Sitting;Oral care;Minimal assistance;Brushing hair           Upper Body Dressing : Moderate assistance;Sitting   Lower Body Dressing: Sitting/lateral leans;Sit to/from stand;Moderate assistance   Toilet Transfer: Ambulance person;Ambulation                   Vision Baseline Vision/History: 1 Wears glasses Patient Visual Report: No change from baseline Vision Assessment?: No apparent visual deficits     Perception Perception: Not tested       Praxis Praxis: Not tested       Pertinent Vitals/Pain Pain Assessment Pain Assessment: Faces Faces Pain Scale: Hurts little more Pain Location: R shoulder Pain Descriptors / Indicators: Discomfort, Aching, Sore Pain Intervention(s): Monitored during session     Extremity/Trunk Assessment Upper Extremity Assessment Upper Extremity Assessment: Right hand dominant;LUE deficits/detail;RUE deficits/detail RUE Sensation: decreased light touch RUE Coordination: decreased gross motor LUE Deficits / Details: hx of rotator cuff tear > 40 years ago- some shoulder flexion impairments but able to use this UE functionally,  difficulty reaching over to R side and overhead for ADLs. ambidextrous LUE Coordination: decreased gross motor   Lower Extremity Assessment Lower Extremity Assessment: Generalized weakness   Cervical / Trunk Assessment Cervical / Trunk Assessment: Other exceptions;Back Surgery Cervical / Trunk Exceptions: Body Habitus   Communication Communication Communication:  No apparent difficulties   Cognition Arousal: Alert Behavior During Therapy: WFL for tasks assessed/performed Cognition: No apparent impairments                               Following commands: Intact       Cueing  General Comments   Cueing Techniques: Verbal cues   VSS on RA   Exercises     Shoulder Instructions      Home Living Family/patient expects to be discharged to:: Private residence Living Arrangements: Spouse/significant other Available Help at Discharge: Family;Available 24 hours/day Type of Home: House Home Access: Stairs to enter [employer] of Steps: 4 Entrance Stairs-Rails: Right;Left Home Layout: One level     Bathroom Shower/Tub: Chief Strategy Officer: Standard Bathroom Accessibility: No   Home Equipment: Shower seat;Cane - single point;Rolling Walker (2 wheels);Toilet riser          Prior Functioning/Environment Prior Level of Function : Needs assist             Mobility Comments: Ambulates using SPC. Pt reports 2-3 falls in the past 61mo. ADLs Comments: Assist as needed since TSA    OT Problem List: Decreased activity tolerance;Impaired balance (sitting and/or standing);Decreased knowledge of precautions;Impaired UE functional use;Decreased coordination;Decreased strength   OT Treatment/Interventions:        OT Goals(Current goals can be found in the care plan section)   Acute Rehab OT Goals Patient Stated Goal: Home tomorrow OT Goal Formulation: With patient Time For Goal Achievement: 07/29/24 Potential to Achieve Goals: Good   OT Frequency:       Co-evaluation              AM-PAC OT 6 Clicks Daily Activity     Outcome Measure Help from another person eating meals?: A Little Help from another person taking care of personal grooming?: A Little Help from another person toileting, which includes using toliet, bedpan, or urinal?: A Lot Help from another person bathing (including  washing, rinsing, drying)?: A Lot Help from another person to put on and taking off regular upper body clothing?: A Lot Help from another person to put on and taking off regular lower body clothing?: A Lot 6 Click Score: 14   End of Session Nurse Communication: Mobility status  Activity Tolerance: Patient tolerated treatment well Patient left: in chair;with call bell/phone within reach  OT Visit Diagnosis: Unsteadiness on feet (R26.81);Muscle weakness (generalized) (M62.81)                Time: 9189-9149 OT Time Calculation (min): 40 min Charges:  OT General Charges $OT Visit: 1 Visit OT Evaluation $OT Eval Moderate Complexity: 1 Mod OT Treatments $Self Care/Home Management : 8-22 mins $Therapeutic Activity: 8-22 mins  07/26/2024  RP, OTR/L  Acute Rehabilitation Services  Office:  [phone]   Christian [name] 07/26/2024, 8:56 AM

## 2024-07-26 NOTE — Plan of Care (Signed)
  Problem: Education: Goal: Knowledge of General Education information will improve Description: Including pain rating scale, medication(s)/side effects and non-pharmacologic comfort measures Outcome: Completed/Met   Problem: Health Behavior/Discharge Planning: Goal: Ability to manage health-related needs will improve Outcome: Completed/Met   Problem: Clinical Measurements: Goal: Ability to maintain clinical measurements within normal limits will improve Outcome: Completed/Met Goal: Will remain free from infection Outcome: Completed/Met Goal: Diagnostic test results will improve Outcome: Completed/Met Goal: Respiratory complications will improve Outcome: Completed/Met Goal: Cardiovascular complication will be avoided Outcome: Completed/Met   Problem: Activity: Goal: Risk for activity intolerance will decrease Outcome: Completed/Met   Problem: Nutrition: Goal: Adequate nutrition will be maintained Outcome: Completed/Met   Problem: Coping: Goal: Level of anxiety will decrease Outcome: Completed/Met   Problem: Elimination: Goal: Will not experience complications related to bowel motility Outcome: Completed/Met Goal: Will not experience complications related to urinary retention Outcome: Completed/Met   Problem: Pain Managment: Goal: General experience of comfort will improve and/or be controlled Outcome: Completed/Met   Problem: Safety: Goal: Ability to remain free from injury will improve Outcome: Completed/Met   Problem: Skin Integrity: Goal: Risk for impaired skin integrity will decrease Outcome: Completed/Met   Problem: Education: Goal: Knowledge of the prescribed therapeutic regimen will improve Outcome: Completed/Met Goal: Understanding of activity limitations/precautions following surgery will improve Outcome: Completed/Met Goal: Individualized Educational Video(s) Outcome: Completed/Met   Problem: Activity: Goal: Ability to tolerate increased activity  will improve Outcome: Completed/Met   Problem: Pain Management: Goal: Pain level will decrease with appropriate interventions Outcome: Completed/Met   Problem: Education: Goal: Ability to describe self-care measures that may prevent or decrease complications (Diabetes Survival Skills Education) will improve Outcome: Completed/Met Goal: Individualized Educational Video(s) Outcome: Completed/Met   Problem: Coping: Goal: Ability to adjust to condition or change in health will improve Outcome: Completed/Met   Problem: Fluid Volume: Goal: Ability to maintain a balanced intake and output will improve Outcome: Completed/Met   Problem: Health Behavior/Discharge Planning: Goal: Ability to identify and utilize available resources and services will improve Outcome: Completed/Met Goal: Ability to manage health-related needs will improve Outcome: Completed/Met   Problem: Metabolic: Goal: Ability to maintain appropriate glucose levels will improve Outcome: Completed/Met   Problem: Nutritional: Goal: Maintenance of adequate nutrition will improve Outcome: Completed/Met Goal: Progress toward achieving an optimal weight will improve Outcome: Completed/Met   Problem: Skin Integrity: Goal: Risk for impaired skin integrity will decrease Outcome: Completed/Met   Problem: Tissue Perfusion: Goal: Adequacy of tissue perfusion will improve Outcome: Completed/Met

## 2024-07-26 NOTE — Addendum Note (Signed)
 Addendum  created 07/26/24 0657 by Harrold Macintosh, CRNA   Intraprocedure Event edited

## 2024-07-26 NOTE — Inpatient Diabetes Management (Signed)
 Inpatient Diabetes Program Recommendations  AACE/ADA: New Consensus Statement on Inpatient Glycemic Control (2015)  Target Ranges:  Prepandial:   less than 140 mg/dL      Peak postprandial:   less than 180 mg/dL (1-2 hours)      Critically ill patients:  140 - 180 mg/dL   Lab Results  Component Value Date   GLUCAP 237 (H) 07/26/2024   HGBA1C 6.0 (H) 05/03/2024    Review of Glycemic Control  Latest Reference Range & Units 07/25/24 20:03  Glucose-Capillary 70 - 99 mg/dL 899 (H)  (H): Data is abnormally high Diabetes history: Type 2 DM Outpatient Diabetes medications: U500- 50 units every day, Metformin  1000 mg QD Current orders for Inpatient glycemic control: Metformin  1000 mg QD  Inpatient Diabetes Program Recommendations:    Consider adding Lantus  25 units every day and Novolog  0-15 units TID & HS. Secure chat sent to MD.   Thanks, Tinnie Minus, MSN, RNC-OB Diabetes Coordinator [phone] (8a-5p)

## 2024-07-26 NOTE — Progress Notes (Signed)
  Subjective: Christian [name] is a 73 y.o. male s/p right revision RSA with poly swap.  They are POD 1.  Pt's pain is controlled.  Patient denies any complaints of chest pain, shortness of breath, abdominal pain.  Has had minimal ambulation so far since getting up to the unit yesterday around 9 PM.  Objective: Vital signs in last 24 hours: Temp:  [97.6 F (36.4 C)-98.9 F (37.2 C)] 98.9 F (37.2 C) (09/19 0313) Pulse Rate:  [66-106] 101 (09/19 0313) Resp:  [13-22] 20 (09/19 0313) BP: (110-151)/(68-95) 110/70 (09/19 0313) SpO2:  [91 %-100 %] 94 % (09/19 0313) Weight:  [122.5 kg] 122.5 kg (09/18 1450)  Intake/Output from previous day: 09/18 0701 - 09/19 0700 In: 950 [I.V.:800; IV Piggyback:150] Out: 1500 [Urine:1400; Blood:100] Intake/Output this shift: No intake/output data recorded.  Exam:  No gross blood or drainage overlying the dressing 2+ radial pulse of the operative extremity Postoperative physical exam somewhat limited by interscalene block but intact EPL, FPL, finger abduction, grip strength testing, pronation/supination.   Labs: No results for input(s): HGB in the last 72 hours. No results for input(s): WBC, RBC, HCT, PLT in the last 72 hours. No results for input(s): NA, K, CL, CO2, BUN, CREATININE, GLUCOSE, CALCIUM in the last 72 hours. No results for input(s): LABPT, INR in the last 72 hours.  Assessment/Plan: Pt is POD 1 s/p right revision RSA    -Plan to discharge to home likely tomorrow to allow time for additional doses of IV antibiotics and ensure patient is medically stable.  - Absolutely no range of motion of the right arm.  He is okay to move his wrist but no elbow or shoulder range of motion.  Stay in sling at all times.  We will plan to do this for 3 weeks postop.  -Follow-up with Dr. Addie in clinic 2 weeks postoperatively     [facility] 07/26/2024, 7:46 AM

## 2024-07-27 LAB — GLUCOSE, CAPILLARY: Glucose-Capillary: 144 mg/dL — ABNORMAL HIGH (ref 70–99)

## 2024-07-27 MED ORDER — OXYCODONE HCL 5 MG PO TABS: 5.0000 mg | ORAL_TABLET | ORAL | 0 refills | Status: DC | PRN

## 2024-07-27 NOTE — Progress Notes (Signed)
 Patient doing well. No complaints. Ready for discharge home.  No CP/SOB.  Wound vac intact with good suction and no drainage in the wound vac cannister.  Plan to continue with plan of no shoulder ROM at all as discussed with patient.  Stay in sling at all times and follow-up next week with Dr Addie or myself for wound vac removal and new xrays

## 2024-07-27 NOTE — Progress Notes (Signed)
Patient is discharged from room 3C11 at this time. Alert and in stable condition. IV site d/c'd and instructions read to patient and daughter with understanding verbalized and all questions answered. Left unit via wheelchair with all belongings at side.  

## 2024-07-28 DIAGNOSIS — T84028A Dislocation of other internal joint prosthesis, initial encounter: Secondary | ICD-10-CM

## 2024-07-29 ENCOUNTER — Ambulatory Visit: Admitting: Cardiovascular Disease

## 2024-07-29 ENCOUNTER — Telehealth: Payer: Self-pay

## 2024-07-29 NOTE — Discharge Summary (Signed)
 Physician Discharge Summary      Patient ID: [name] MRN: [id number] DOB/AGE: [id number] y.o.  Admit date: 07/25/2024 Discharge date: 07/27/2024  Admission Diagnoses:  Principal Problem:   S/P shoulder surgery Active Problems:   Instability of prosthetic shoulder joint [name])   Discharge Diagnoses:  Same  Surgeries: Procedure(s): REVISION, REVERSE TOTAL ARTHROPLASTY, SHOULDER on 07/25/2024   Consultants:   Discharged Condition: Stable  Hospital Course: Christian [name] is an 73 y.o. male who was admitted 07/25/2024 with a chief complaint of right shoulder instability, and found to have a diagnosis of right shoulder dislocation following reverse shoulder arthroplasty.  They were brought to the operating room on 07/25/2024 and underwent the above named procedures.  Pt awoke from anesthesia without complication and was transferred to the floor. On POD1, patient's pain was controlled.  No complaints of instability or significant discomfort.  No red flag signs or symptoms throughout his stay.  On POD 2, he was discharged home.  He will stay in the sling at all times with no range of motion of the operative shoulder or ipsilateral elbow..  Pt will f/u with Dr. Addie in clinic in ~2 weeks.   Antibiotics given:  Anti-infectives (From admission, onward)    Start     Dose/Rate Route Frequency Ordered Stop   07/25/24 2230  ceFAZolin  (ANCEF ) IVPB 2g/100 mL premix        2 g 200 mL/hr over 30 Minutes Intravenous Every 8 hours 07/25/24 2143 07/27/24 1009   07/25/24 1845  vancomycin  (VANCOCIN ) powder  Status:  Discontinued          As needed 07/25/24 1845 07/25/24 1958   07/25/24 1445  ceFAZolin  (ANCEF ) IVPB 3g/150 mL premix        3 g 300 mL/hr over 30 Minutes Intravenous On call to O.R. 07/25/24 1426 07/25/24 1837   07/25/24 1428  ceFAZolin  (ANCEF ) 3-4 GM/150ML-% IVPB       Note to Pharmacy: Christian [name] N: cabinet override      07/25/24 1428 07/25/24 1807      .  Recent vital signs:  Vitals:   07/27/24 0743 07/27/24 0743  BP: 125/80 125/80  Pulse: 99 (!) 101  Resp: 20 20  Temp: 98.4 F (36.9 C) 98.4 F (36.9 C)  SpO2: 93% 91%    Recent laboratory studies:  Results for orders placed or performed during the hospital encounter of 07/25/24  Glucose, capillary   Collection Time: 07/25/24  2:57 PM  Result Value Ref Range   Glucose-Capillary 125 (H) 70 - 99 mg/dL  Glucose, capillary   Collection Time: 07/25/24  8:03 PM  Result Value Ref Range   Glucose-Capillary 100 (H) 70 - 99 mg/dL  Glucose, capillary   Collection Time: 07/25/24  9:28 PM  Result Value Ref Range   Glucose-Capillary 109 (H) 70 - 99 mg/dL   Comment 1 Notify RN    Comment 2 Document in Chart   Glucose, capillary   Collection Time: 07/26/24  6:05 AM  Result Value Ref Range   Glucose-Capillary 237 (H) 70 - 99 mg/dL   Comment 1 Notify RN    Comment 2 Document in Chart   Glucose, capillary   Collection Time: 07/26/24 11:54 AM  Result Value Ref Range   Glucose-Capillary 231 (H) 70 - 99 mg/dL  Glucose, capillary   Collection Time: 07/26/24  3:52 PM  Result Value Ref Range   Glucose-Capillary 254 (H) 70 - 99 mg/dL  Glucose, capillary  Collection Time: 07/26/24  9:39 PM  Result Value Ref Range   Glucose-Capillary 181 (H) 70 - 99 mg/dL  Glucose, capillary   Collection Time: 07/27/24  6:02 AM  Result Value Ref Range   Glucose-Capillary 144 (H) 70 - 99 mg/dL    Discharge Medications:   Allergies as of 07/27/2024       Reactions   Jardiance  [empagliflozin ]    Yeast infections   Metoprolol  Other (See Comments)   Caused blood sugars to be elevated and not controlled   Statins    Muscle aches, joint pain   Penicillins Rash   07/25/2024 tolerated cefazolin         Medication List     STOP taking these medications    traMADol  50 MG tablet Commonly known as: ULTRAM        TAKE these medications    acetaminophen  325 MG tablet Commonly known as:  TYLENOL  Take 1-2 tablets (325-650 mg total) by mouth every 6 (six) hours as needed for mild pain (pain score 1-3) or fever (or temp > 100.5).   aspirin  EC 81 MG tablet Take 1 tablet (81 mg total) by mouth daily. Swallow whole. What changed: when to take this   BD Pen Needle Nano 2nd Gen 32G X 4 MM Misc Generic drug: Insulin  Pen Needle USE AS DIRECTED 5 TIMES A DAY WITH INSULIN  INJECTIONS   diclofenac  75 MG EC tablet Commonly known as: VOLTAREN  Take 75 mg by mouth 2 (two) times daily.   docusate sodium  100 MG capsule Commonly known as: COLACE Take 1 capsule (100 mg total) by mouth 2 (two) times daily. What changed:  when to take this reasons to take this   FreeStyle Libre 3 Sensor Misc by Does not apply route. Use to check glucose continuously change every 14 days   HumuLIN  R U-500 KwikPen 500 UNIT/ML KwikPen Generic drug: insulin  regular human CONCENTRATED INJECT 200 UNITS UNDER SKIN A DAY AS ADVISED What changed: See the new instructions.   metFORMIN  1000 MG tablet Commonly known as: GLUCOPHAGE  TAKE 1 TABLET BY MOUTH TWICE  DAILY WITH A MEAL What changed: when to take this   multivitamin with minerals Tabs tablet Take 1 tablet by mouth daily with lunch. What changed: when to take this   oxyCODONE  5 MG immediate release tablet Commonly known as: Oxy IR/ROXICODONE  Take 1 tablet (5 mg total) by mouth every 4 (four) hours as needed for moderate pain (pain score 4-6) (pain score 4-6). What changed: how much to take   tiZANidine  2 MG tablet Commonly known as: ZANAFLEX  Take 1 tablet (2 mg total) by mouth every 8 (eight) hours as needed for muscle spasms.        Diagnostic Studies: DG Shoulder Right Port Result Date: 07/25/2024 EXAM: 1 VIEW XRAY OF THE RIGHT SHOULDER 07/25/2024 08:18:00 PM COMPARISON: 07/09/2024 CLINICAL HISTORY: Status post shoulder surgery FINDINGS: BONES AND JOINTS: Right reverse shoulder arthroplasty noted. Degenerative changes of the  acromioclavicular joint. SOFT TISSUES: Subcutaneous soft tissue edema and soft tissue air consistent with recent surgery. IMPRESSION: 1. Right reverse shoulder arthroplasty. 2. Interval reduction of right shoulder dislocation. 3. Degenerative acromioclavicular joint osteoarthritis. Electronically signed by: Donnice Mania MD 07/25/2024 08:50 PM EDT RP Workstation: [id number]   XR Shoulder Right Result Date: 07/24/2024 Multiple views right shoulder reviewed.  There has been anterior dislocation of reverse shoulder prosthesis compared to immediate postoperative radiographs 2 weeks ago.  No acute fracture.  DG Shoulder Right Port Result Date: 07/09/2024 CLINICAL DATA:  Shoulder  arthritis, postop. EXAM: RIGHT SHOULDER - 1 VIEW COMPARISON:  Preoperative imaging. FINDINGS: Reverse right shoulder arthroplasty in expected alignment. No periprosthetic lucency or fracture. Recent postsurgical change includes air and edema in the joint space and soft tissues. IMPRESSION: Reverse right shoulder arthroplasty without immediate postoperative complication. Electronically Signed   By: Andrea Gasman M.D.   On: 07/09/2024 16:58    Disposition: Discharge disposition: 01-Home or Self Care       Discharge Instructions     Call MD / Call 911   Complete by: As directed    If you experience chest pain or shortness of breath, CALL 911 and be transported to the hospital emergency room.  If you develope a fever above 101 F, pus (white drainage) or increased drainage or redness at the wound, or calf pain, call your surgeon's office.   Constipation Prevention   Complete by: As directed    Drink plenty of fluids.  Prune juice may be helpful.  You may use a stool softener, such as Colace (over the counter) 100 mg twice a day.  Use MiraLax  (over the counter) for constipation as needed.   Diet - low sodium heart healthy   Complete by: As directed    Discharge instructions   Complete by: As directed    You may shower,  dressing is waterproof.  Do not bathe or soak the operative shoulder in a tub, pool.  Absolutely no ROM of the operative shoulder/elbow.  Continue use of the sling with both straps at all times. Follow-up with Dr. Addie in ~1 week on your given appointment date.  We will remove your adhesive bandage at that time.  Call the office on-call number with any concerns.    Dental Antibiotics:  In most cases prophylactic antibiotics for Dental procdeures after total joint surgery are not necessary.  Exceptions are as follows:  1. History of prior total joint infection  2. Severely immunocompromised (Organ Transplant, cancer chemotherapy, Rheumatoid biologic meds such as Humera)  3. Poorly controlled diabetes (A1C &gt; 8.0, blood glucose over 200)  If you have one of these conditions, contact your surgeon for an antibiotic prescription, prior to your dental procedure.   Increase activity slowly as tolerated   Complete by: As directed    Post-operative opioid taper instructions:   Complete by: As directed    POST-OPERATIVE OPIOID TAPER INSTRUCTIONS: It is important to wean off of your opioid medication as soon as possible. If you do not need pain medication after your surgery it is ok to stop day one. Opioids include: Codeine, Hydrocodone (Norco, Vicodin), Oxycodone (Percocet, oxycontin ) and hydromorphone  amongst others.  Long term and even short term use of opiods can cause: Increased pain response Dependence Constipation Depression Respiratory depression And more.  Withdrawal symptoms can include Flu like symptoms Nausea, vomiting And more Techniques to manage these symptoms Hydrate well Eat regular healthy meals Stay active Use relaxation techniques(deep breathing, meditating, yoga) Do Not substitute Alcohol to help with tapering If you have been on opioids for less than two weeks and do not have pain than it is ok to stop all together.  Plan to wean off of opioids This plan should  start within one week post op of your joint replacement. Maintain the same interval or time between taking each dose and first decrease the dose.  Cut the total daily intake of opioids by one tablet each day Next start to increase the time between doses. The last dose that should be eliminated is the  evening dose.             SignedBETHA Herlene Calix 07/29/2024, 8:25 AM

## 2024-07-29 NOTE — Transitions of Care (Post Inpatient/ED Visit) (Signed)
   07/29/2024  Name: Christian [name] MRN: [id number] DOB: 05-Feb-1951  Today's TOC FU Call Status: Today's TOC FU Call Status:: Unsuccessful Call (1st Attempt) Unsuccessful Call (1st Attempt) Date: 07/29/24  Attempted to reach the patient regarding the most recent Inpatient/ED visit.  Follow Up Plan: Additional outreach attempts will be made to reach the patient to complete the Transitions of Care (Post Inpatient/ED visit) call.   Shona Prow RN, CCM [place]  [facility]Population Health RN Care Manager [phone]

## 2024-07-30 ENCOUNTER — Telehealth: Payer: Self-pay

## 2024-07-30 NOTE — Transitions of Care (Post Inpatient/ED Visit) (Signed)
 07/30/2024  Name: Christian [name] MRN: [id number] DOB: [birth year]  Today's TOC FU Call Status: Today's TOC FU Call Status:: Successful TOC FU Call Completed TOC FU Call Complete Date: 07/30/24 Patient's Name and Date of Birth confirmed.  Transition Care Management Follow-up Telephone Call Date of Discharge: 07/27/24 Discharge Facility: [facility]) Type of Discharge: Inpatient Admission Primary Inpatient Discharge Diagnosis:: Total Revision Total arthroplasty How have you been since you were released from the hospital?: Better Any questions or concerns?: No  Items Reviewed: Medications obtained,verified, and reconciled?: Yes (Medications Reviewed) Any new allergies since your discharge?: No Dietary orders reviewed?: NA Do you have support at home?: Yes People in Home [RPT]: spouse Name of Support/Comfort Primary Source: Christian [name]  Medications Reviewed Today: Medications Reviewed Today     Reviewed by Christian Richerd GRADE, RN (Registered Nurse) on 07/30/24 at 1605  Med List Status: <None>   Medication Order Taking? Sig Documenting Provider Last Dose Status Informant  acetaminophen  (TYLENOL ) 325 MG tablet [id number] Yes Take 1-2 tablets (325-650 mg total) by mouth every 6 (six) hours as needed for mild pain (pain score 1-3) or fever (or temp > 100.5). Christian Cordella Hamilton, MD  Active Self, Pharmacy Records  aspirin  EC 81 MG tablet [id number] Yes Take 1 tablet (81 mg total) by mouth daily. Swallow whole.  Patient taking differently: Take 81 mg by mouth in the morning. Swallow whole.   Christian Cordella Hamilton, MD  Active Self, Pharmacy Records  BD PEN NEEDLE NANO 2ND GEN 32G X 4 MM MISC [id number]  USE AS DIRECTED 5 TIMES A DAY WITH INSULIN  INJECTIONS Christian File, MD  Active Self, Pharmacy Records  Continuous Glucose Sensor (FREESTYLE LIBRE 3 [place]) [place] [id number]  by Does not apply route. Use to check glucose continuously change every 14 days [provider]  Active Self,  Pharmacy Records  diclofenac  (VOLTAREN ) 75 MG EC tablet [id number] Yes Take 75 mg by mouth 2 (two) times daily. [provider]  Active Self, Pharmacy Records  docusate sodium  (COLACE) 100 MG capsule [id number]  Take 1 capsule (100 mg total) by mouth 2 (two) times daily.  Patient taking differently: Take 100 mg by mouth as needed for mild constipation or moderate constipation.   Christian Cordella Hamilton, MD  Active Self, Pharmacy Records           Med Note [facility], Christian [name]   Fri Jul 26, 2024  8:55 AM)    insulin  regular human CONCENTRATED (HUMULIN  R U-500 KWIKPEN) 500 UNIT/ML KwikPen [id number] Yes INJECT 200 UNITS UNDER SKIN A DAY AS ADVISED Christian File, MD  Active Self, Pharmacy Records           Med Note [facility], [name]   Fri Jul 26, 2024  9:00 AM) Patient confirmed he had 25 units yesterday morning.  metFORMIN  (GLUCOPHAGE ) 1000 MG tablet [id number] Yes TAKE 1 TABLET BY MOUTH TWICE  DAILY WITH A MEAL  Patient taking differently: Take 1,000 mg by mouth in the morning.   Christian File, MD  Active Self, Pharmacy Records  Multiple Vitamin (MULTIVITAMIN WITH MINERALS) TABS tablet [id number] Yes Take 1 tablet by mouth daily with lunch.  Patient taking differently: Take 1 tablet by mouth every evening.   Christian Sharlet RAMAN, PA-C  Active Self, Pharmacy Records  oxyCODONE  (OXY IR/ROXICODONE ) 5 MG immediate release tablet [id number] Yes Take 1 tablet (5 mg total) by mouth every 4 (four) hours as needed for moderate pain (pain score 4-6) (pain score 4-6). [name], Christian CROME,  PA-C  Active   tiZANidine  (ZANAFLEX ) 2 MG tablet [id number]  Take 1 tablet (2 mg total) by mouth every 8 (eight) hours as needed for muscle spasms.  Patient not taking: Reported on 07/30/2024   Christian Cordella Hamilton, MD  Active Self, Pharmacy Records           Med Note [facility], Christian [name]   Fri Jul 26, 2024  9:02 AM)              Home Care and Equipment/Supplies: Were Home Health Services Ordered?: No Any new  equipment or medical supplies ordered?: NA  Functional Questionnaire: Do you need assistance with bathing/showering or dressing?: Yes Do you need assistance with meal preparation?: Yes Do you need assistance with eating?: No (not really just set up for meal) Do you have difficulty maintaining continence: No Do you need assistance with getting out of bed/getting out of a chair/moving?: No Do you have difficulty managing or taking your medications?: No  Follow up appointments reviewed: PCP Follow-up appointment confirmed?: No (I don't have a PCP appointment until November which is a new patient appointment:) Specialist Hospital Follow-up appointment confirmed?: Yes Date of Specialist follow-up appointment?: 08/02/24 Follow-Up Specialty Provider:: Ortho Do you need transportation to your follow-up appointment?: No Do you understand care options if your condition(s) worsen?: Yes-patient verbalized understanding (I will follow up with Ortho for s/s of infection or surgical needs, my endocrinologist for diabetes, I have cardiology follow up in October and new patient appointment in November)  SDOH Interventions Today    Flowsheet Row Most Recent Value  SDOH Interventions   Food Insecurity Interventions Intervention Not Indicated  Housing Interventions Intervention Not Indicated  Transportation Interventions Intervention Not Indicated  Utilities Interventions Intervention Not Indicated   Per AVS instructions:   Call MD / Call 911 If you experience chest pain or shortness of breath, CALL 911 and be transported to the hospital emergency room. If you develope a fever above 101 F, pus (white drainage) or increased drainage or redness at the wound, or calf pain, call your surgeon's office.   Richerd Fish, RN, BSN, CCM [facility] RN Care Manager Direct Dial: [phone]

## 2024-08-02 ENCOUNTER — Ambulatory Visit (INDEPENDENT_AMBULATORY_CARE_PROVIDER_SITE_OTHER): Admitting: Surgical

## 2024-08-02 ENCOUNTER — Other Ambulatory Visit (INDEPENDENT_AMBULATORY_CARE_PROVIDER_SITE_OTHER): Payer: Self-pay

## 2024-08-02 DIAGNOSIS — Z96611 Presence of right artificial shoulder joint: Secondary | ICD-10-CM

## 2024-08-03 ENCOUNTER — Encounter: Payer: Self-pay | Admitting: Surgical

## 2024-08-03 NOTE — Progress Notes (Signed)
 Post-Op Visit Note   Patient: Christian [name]           Date of Birth: 20-Feb-1951           MRN: [id number] Visit Date: 08/02/2024 PCP: Myrla Jon HERO, MD   Assessment & Plan:  Chief Complaint:  Chief Complaint  Patient presents with   Right Shoulder - Routine Post Op    07/25/2024 Right RSA Revision 07/09/2024 Right RSA   Visit Diagnoses:  1. History of arthroplasty of right shoulder     Plan: Patient is a 73 year old male who presents s/p right reverse shoulder arthroplasty revision on 07/25/2024 that was performed for instability.  He had humeral tray/polyethylene swap to retention liner.  Overall he is doing okay.  Pain is controlled with oxycodone .  He has no episodes of instability.  He has remained in the sling using the Prevena wound VAC.  No fevers or chills.  No significant drainage in the wound VAC canister.  No chest pain or shortness of breath.  On exam, patient has intact EPL, FPL, finger abduction, pronation/supination, bicep, tricep, deltoid.  Axillary nerve is intact with deltoid firing.  There is no deformity to the shoulder noted and it does rotate fairly smoothly without any significant crepitus and with no suggestion of instability.  2+ radial pulse of the operative extremity.  Incision looks to be healing well with sutures intact.  No evidence of infection or dehiscence.  Plan at this time is return in about a week for suture removal.  Too early to remove sutures today.  Aquacel dressing was placed over the incision to keep it waterproof for showering.  Wound VAC discontinued.  Radiographs demonstrate no dislocation/instability.  Plan to continue with no lifting with the operative arm and staying in the sling at all times aside from showering.  Follow-Up Instructions: No follow-ups on file.   Orders:  Orders Placed This Encounter  Procedures   XR Shoulder Right   No orders of the defined types were placed in this encounter.   Imaging: No results  found.  PMFS History: Patient Active Problem List   Diagnosis Date Noted   Instability of prosthetic shoulder joint 07/28/2024   S/P shoulder surgery 07/25/2024   Shoulder arthritis 07/09/2024   Microalbuminuria due to type 2 diabetes mellitus (HCC) 06/14/2024   Skin tag 05/03/2024   Leucocytosis 03/19/2024   Neurogenic claudication due to lumbar spinal stenosis 03/07/2024   Preop examination 02/26/2024   Tachycardia 02/26/2024   Venous stasis ulcer, left lower extremity with lymphedema 02/07/2023   Insulin  dependent type 2 diabetes mellitus (HCC) 01/02/2023   Impingement syndrome, shoulder, right 10/15/2021   Olecranon bursitis, left elbow 09/08/2021   Annual physical exam 06/01/2021   Polyneuropathy associated with underlying disease 05/08/2019   Chronic left shoulder pain 01/02/2019   Hyperlipidemia associated with type 2 diabetes mellitus (HCC) 09/03/2017   Uncontrolled type 2 diabetes mellitus with peripheral neuropathy 01/27/2016   Status post total right knee replacement 05/01/2015   Gout 09/13/2013   Chronic pain syndrome 09/13/2013   Primary osteoarthritis of left knee 09/13/2013   Morbid obesity (HCC) 09/13/2013   Seizures (HCC)    Hypertension    Past Medical History:  Diagnosis Date   Arthritis of left knee    Chronic back pain 09/13/2013   Chronic pain syndrome 09/13/2013   Diabetic ulcer of right ankle (HCC)    GERD (gastroesophageal reflux disease)    hx. of   Gout 09/13/2013   Hemorrhoids  Hypertension    Morbid obesity with body mass index of 45.0-49.9 in adult [facility]) 09/13/2013   Osteoarthritis 09/13/2013   Pneumonia    hx. of   Seizures (HCC)    Sleep apnea    has never used C-pap machine due to insurance cost   Type 2 diabetes mellitus, uncontrolled     Family History  Problem Relation Age of Onset   Alcohol abuse Father    Arthritis Maternal Grandmother    Alcohol abuse Paternal Grandmother    Alcohol abuse Paternal Grandfather     Diabetes Maternal Aunt     Past Surgical History:  Procedure Laterality Date   broken toe 35 years ago     COLONOSCOPY WITH PROPOFOL  N/A 12/15/2022   Procedure: COLONOSCOPY WITH PROPOFOL ;  Surgeon: Therisa Bi, MD;  Location: [facility] ENDOSCOPY;  Service: Gastroenterology;  Laterality: N/A;   IR EMBO ARTERIAL NOT HEMORR HEMANG INC GUIDE ROADMAPPING  04/27/2023   IR RADIOLOGIST EVAL & MGMT  03/29/2023   IR RADIOLOGIST EVAL & MGMT  08/02/2023   REVERSE SHOULDER ARTHROPLASTY Right 07/09/2024   Procedure: ARTHROPLASTY, SHOULDER, TOTAL, REVERSE;  Surgeon: [name], MD;  Location: [facility] OR;  Service: Orthopedics;  Laterality: Right;   REVISION TOTAL SHOULDER TO REVERSE TOTAL SHOULDER Right 07/25/2024   Procedure: REVISION, REVERSE TOTAL ARTHROPLASTY, SHOULDER;  Surgeon: [name], MD;  Location: [facility] OR;  Service: Orthopedics;  Laterality: Right;  right shoulder open reduction, poly exchange   TOTAL KNEE ARTHROPLASTY Right 05/01/2015   Procedure: RIGHT TOTAL KNEE ARTHROPLASTY;  Surgeon: Lonni CINDERELLA Poli, MD;  Location: [facility] ORS;  Service: Orthopedics;  Laterality: Right;   WISDOM TOOTH EXTRACTION     Social History   Occupational History   Occupation: Retired  Tobacco Use   Smoking status: Former    Current packs/day: 0.00    Average packs/day: 1 pack/day for 42.2 years (42.2 ttl pk-yrs)    Types: Cigarettes    Start date: 12/12/1961    Quit date: 03/08/2004    Years since quitting: 20.4   Smokeless tobacco: Never  Vaping Use   Vaping status: Never Used  Substance and Sexual Activity   Alcohol use: No    Comment: rare   Drug use: No   Sexual activity: Yes    Partners: Female

## 2024-08-09 ENCOUNTER — Encounter: Payer: Self-pay | Admitting: Surgical

## 2024-08-09 ENCOUNTER — Ambulatory Visit: Admitting: Surgical

## 2024-08-09 DIAGNOSIS — Z96611 Presence of right artificial shoulder joint: Secondary | ICD-10-CM

## 2024-08-09 NOTE — Progress Notes (Signed)
 Post-Op Visit Note   Patient: Christian [name]           Date of Birth: 15-Sep-1951           MRN: [id number] Visit Date: 08/09/2024 PCP: Myrla Jon HERO, MD   Assessment & Plan:  Chief Complaint:  Chief Complaint  Patient presents with   Right Shoulder - Routine Post Op    07/25/2024 Right RSA revision   Visit Diagnoses: No diagnosis found.  Plan: Patient is a 73 year old male who presents s/p right reverse shoulder arthroplasty revision on 07/25/2024.  Doing well.  Pain control with oxycodone .  Here today for incision check and suture removal.  Sutures removed and replaced with Steri-Strips today.  He has intact EPL, FPL, finger abduction, pronation/supination, bicep, tricep, deltoid. Axillary nerve is intact with deltoid firing.  2+ radial pulse of the operative extremity.  Sutures removed and no evidence of dehiscence after suture removal.  Plan at this time is continue with sling immobilization for 1 more week.  After that, he may come out of the sling and do some pendulum exercises but no significant below shoulder range of motion aside from that and no above shoulder range of motion..  No lifting with the operative arm.  Follow-up in 2 weeks for clinical recheck with Dr. Addie and repeat radiographs at that time.  Follow-Up Instructions: No follow-ups on file.   Orders:  No orders of the defined types were placed in this encounter.  No orders of the defined types were placed in this encounter.   Imaging: No results found.  PMFS History: Patient Active Problem List   Diagnosis Date Noted   Instability of prosthetic shoulder joint 07/28/2024   S/P shoulder surgery 07/25/2024   Shoulder arthritis 07/09/2024   Microalbuminuria due to type 2 diabetes mellitus (HCC) 06/14/2024   Skin tag 05/03/2024   Leucocytosis 03/19/2024   Neurogenic claudication due to lumbar spinal stenosis 03/07/2024   Preop examination 02/26/2024   Tachycardia 02/26/2024   Venous stasis  ulcer, left lower extremity with lymphedema 02/07/2023   Insulin  dependent type 2 diabetes mellitus (HCC) 01/02/2023   Impingement syndrome, shoulder, right 10/15/2021   Olecranon bursitis, left elbow 09/08/2021   Annual physical exam 06/01/2021   Polyneuropathy associated with underlying disease 05/08/2019   Chronic left shoulder pain 01/02/2019   Hyperlipidemia associated with type 2 diabetes mellitus (HCC) 09/03/2017   Uncontrolled type 2 diabetes mellitus with peripheral neuropathy 01/27/2016   Status post total right knee replacement 05/01/2015   Gout 09/13/2013   Chronic pain syndrome 09/13/2013   Primary osteoarthritis of left knee 09/13/2013   Morbid obesity (HCC) 09/13/2013   Seizures (HCC)    Hypertension    Past Medical History:  Diagnosis Date   Arthritis of left knee    Chronic back pain 09/13/2013   Chronic pain syndrome 09/13/2013   Diabetic ulcer of right ankle (HCC)    GERD (gastroesophageal reflux disease)    hx. of   Gout 09/13/2013   Hemorrhoids    Hypertension    Morbid obesity with body mass index of 45.0-49.9 in adult [facility]) 09/13/2013   Osteoarthritis 09/13/2013   Pneumonia    hx. of   Seizures (HCC)    Sleep apnea    has never used C-pap machine due to insurance cost   Type 2 diabetes mellitus, uncontrolled     Family History  Problem Relation Age of Onset   Alcohol abuse Father    Arthritis Maternal Grandmother  Alcohol abuse Paternal Grandmother    Alcohol abuse Paternal Grandfather    Diabetes Maternal Aunt     Past Surgical History:  Procedure Laterality Date   broken toe 35 years ago     COLONOSCOPY WITH PROPOFOL  N/A 12/15/2022   Procedure: COLONOSCOPY WITH PROPOFOL ;  Surgeon: Therisa Bi, MD;  Location: [facility] ENDOSCOPY;  Service: Gastroenterology;  Laterality: N/A;   IR EMBO ARTERIAL NOT HEMORR HEMANG INC GUIDE ROADMAPPING  04/27/2023   IR RADIOLOGIST EVAL & MGMT  03/29/2023   IR RADIOLOGIST EVAL & MGMT  08/02/2023   REVERSE SHOULDER  ARTHROPLASTY Right 07/09/2024   Procedure: ARTHROPLASTY, SHOULDER, TOTAL, REVERSE;  Surgeon: [name], MD;  Location: [facility] OR;  Service: Orthopedics;  Laterality: Right;   REVISION TOTAL SHOULDER TO REVERSE TOTAL SHOULDER Right 07/25/2024   Procedure: REVISION, REVERSE TOTAL ARTHROPLASTY, SHOULDER;  Surgeon: [name], MD;  Location[facility] OR;  Service: Orthopedics;  Laterality: Right;  right shoulder open reduction, poly exchange   TOTAL KNEE ARTHROPLASTY Right 05/01/2015   Procedure: RIGHT TOTAL KNEE ARTHROPLASTY;  Surgeon: Lonni CINDERELLA Poli, MD;  Location: [facility] ORS;  Service: Orthopedics;  Laterality: Right;   WISDOM TOOTH EXTRACTION     Social History   Occupational History   Occupation: Retired  Tobacco Use   Smoking status: Former    Current packs/day: 0.00    Average packs/day: 1 pack/day for 42.2 years (42.2 ttl pk-yrs)    Types: Cigarettes    Start date: 12/12/1961    Quit date: 03/08/2004    Years since quitting: 20.4   Smokeless tobacco: Never  Vaping Use   Vaping status: Never Used  Substance and Sexual Activity   Alcohol use: No    Comment: rare   Drug use: No   Sexual activity: Yes    Partners: Female

## 2024-08-12 ENCOUNTER — Other Ambulatory Visit: Payer: Self-pay | Admitting: Cardiology

## 2024-08-12 ENCOUNTER — Ambulatory Visit: Attending: Cardiology

## 2024-08-12 DIAGNOSIS — Z01810 Encounter for preprocedural cardiovascular examination: Secondary | ICD-10-CM

## 2024-08-12 DIAGNOSIS — I4510 Unspecified right bundle-branch block: Secondary | ICD-10-CM

## 2024-08-12 LAB — ECHOCARDIOGRAM COMPLETE
Area-P 1/2: 3.21 cm2
S' Lateral: 2.9 cm
Single Plane A4C EF: 50.8 %

## 2024-08-12 MED ORDER — PERFLUTREN LIPID MICROSPHERE
1.0000 mL | INTRAVENOUS | Status: AC | PRN
  Administered 2024-08-12: 1 mL via INTRAVENOUS

## 2024-08-15 ENCOUNTER — Ambulatory Visit: Payer: Self-pay | Admitting: Cardiology

## 2024-08-23 ENCOUNTER — Other Ambulatory Visit (INDEPENDENT_AMBULATORY_CARE_PROVIDER_SITE_OTHER): Payer: Self-pay

## 2024-08-23 ENCOUNTER — Ambulatory Visit (INDEPENDENT_AMBULATORY_CARE_PROVIDER_SITE_OTHER): Admitting: Orthopedic Surgery

## 2024-08-23 ENCOUNTER — Encounter: Payer: Self-pay | Admitting: Orthopedic Surgery

## 2024-08-23 DIAGNOSIS — Z96611 Presence of right artificial shoulder joint: Secondary | ICD-10-CM

## 2024-08-23 MED ORDER — OXYCODONE-ACETAMINOPHEN 5-325 MG PO TABS: 1.0000 | ORAL_TABLET | Freq: Four times a day (QID) | ORAL | 0 refills | Status: DC | PRN

## 2024-08-23 NOTE — Addendum Note (Signed)
 Addended by: ADDIE HACKER on: 08/23/2024 10:57 AM   Modules accepted: Orders

## 2024-08-23 NOTE — Progress Notes (Signed)
 Post-Op Visit Note   Patient: Christian [name]           Date of Birth: [birth year]           MRN: [id number] Visit Date: 08/23/2024 PCP: Myrla Jon HERO, MD   Assessment & Plan:  Chief Complaint:  Chief Complaint  Patient presents with   Right Shoulder - Routine Post Op   Visit Diagnoses:  1. Status post reverse arthroplasty of right shoulder     Plan: Patient is now a month out from revision reverse replacement.  Overall he has been doing well.  Having little bit of numbness and tingling in the fingertips.  Functionality is returning but the pain relief has been better.  On exam his got functional deltoid as well as no real pain with passive range of motion of the shoulder.  Radiographs show reduced implant.  Plan at this time is to stay status quo for 4 more weeks to let scar tissue settle in and then he can start physical therapy for active range of motion and passive range of motion at that time.  He will follow-up with Herlene in 2 months.  Follow-Up Instructions: No follow-ups on file.   Orders:  Orders Placed This Encounter  Procedures   XR Shoulder Right   No orders of the defined types were placed in this encounter.   Imaging: XR Shoulder Right Result Date: 08/23/2024 AP axillary outlet radiograph right shoulder reviewed.  Reverse shoulder prosthesis in good position alignment with no complicating features.  Shoulder is located.   PMFS History: Patient Active Problem List   Diagnosis Date Noted   Instability of prosthetic shoulder joint 07/28/2024   S/P shoulder surgery 07/25/2024   Shoulder arthritis 07/09/2024   Microalbuminuria due to type 2 diabetes mellitus (HCC) 06/14/2024   Skin tag 05/03/2024   Leucocytosis 03/19/2024   Neurogenic claudication due to lumbar spinal stenosis 03/07/2024   Preop examination 02/26/2024   Tachycardia 02/26/2024   Venous stasis ulcer, left lower extremity with lymphedema 02/07/2023   Insulin  dependent type 2 diabetes  mellitus (HCC) 01/02/2023   Impingement syndrome, shoulder, right 10/15/2021   Olecranon bursitis, left elbow 09/08/2021   Annual physical exam 06/01/2021   Polyneuropathy associated with underlying disease 05/08/2019   Chronic left shoulder pain 01/02/2019   Hyperlipidemia associated with type 2 diabetes mellitus (HCC) 09/03/2017   Uncontrolled type 2 diabetes mellitus with peripheral neuropathy 01/27/2016   Status post total right knee replacement 05/01/2015   Gout 09/13/2013   Chronic pain syndrome 09/13/2013   Primary osteoarthritis of left knee 09/13/2013   Morbid obesity (HCC) 09/13/2013   Seizures (HCC)    Hypertension    Past Medical History:  Diagnosis Date   Arthritis of left knee    Chronic back pain 09/13/2013   Chronic pain syndrome 09/13/2013   Diabetic ulcer of right ankle (HCC)    GERD (gastroesophageal reflux disease)    hx. of   Gout 09/13/2013   Hemorrhoids    Hypertension    Morbid obesity with body mass index of 45.0-49.9 in adult [facility]) 09/13/2013   Osteoarthritis 09/13/2013   Pneumonia    hx. of   Seizures (HCC)    Sleep apnea    has never used C-pap machine due to insurance cost   Type 2 diabetes mellitus, uncontrolled     Family History  Problem Relation Age of Onset   Alcohol abuse Father    Arthritis Maternal Grandmother    Alcohol abuse Paternal  Grandmother    Alcohol abuse Paternal Grandfather    Diabetes Maternal Aunt     Past Surgical History:  Procedure Laterality Date   broken toe 35 years ago     COLONOSCOPY WITH PROPOFOL  N/A 12/15/2022   Procedure: COLONOSCOPY WITH PROPOFOL ;  Surgeon: Therisa Bi, MD;  Location: [facility] ENDOSCOPY;  Service: Gastroenterology;  Laterality: N/A;   IR EMBO ARTERIAL NOT HEMORR HEMANG INC GUIDE ROADMAPPING  04/27/2023   IR RADIOLOGIST EVAL & MGMT  03/29/2023   IR RADIOLOGIST EVAL & MGMT  08/02/2023   REVERSE SHOULDER ARTHROPLASTY Right 07/09/2024   Procedure: ARTHROPLASTY, SHOULDER, TOTAL, REVERSE;  Surgeon:  [name], MD;  Location: [facility] OR;  Service: Orthopedics;  Laterality: Right;   REVISION TOTAL SHOULDER TO REVERSE TOTAL SHOULDER Right 07/25/2024   Procedure: REVISION, REVERSE TOTAL ARTHROPLASTY, SHOULDER;  Surgeon: [name], MD;  Location: [facility] OR;  Service: Orthopedics;  Laterality: Right;  right shoulder open reduction, poly exchange   TOTAL KNEE ARTHROPLASTY Right 05/01/2015   Procedure: RIGHT TOTAL KNEE ARTHROPLASTY;  Surgeon: Lonni CINDERELLA Poli, MD;  Location: [facility] ORS;  Service: Orthopedics;  Laterality: Right;   WISDOM TOOTH EXTRACTION     Social History   Occupational History   Occupation: Retired  Tobacco Use   Smoking status: Former    Current packs/day: 0.00    Average packs/day: 1 pack/day for 42.2 years (42.2 ttl pk-yrs)    Types: Cigarettes    Start date: 12/12/1961    Quit date: 03/08/2004    Years since quitting: 20.4   Smokeless tobacco: Never  Vaping Use   Vaping status: Never Used  Substance and Sexual Activity   Alcohol use: No    Comment: rare   Drug use: No   Sexual activity: Yes    Partners: Female

## 2024-08-25 NOTE — Telephone Encounter (Signed)
 Christian [name]

## 2024-09-02 ENCOUNTER — Other Ambulatory Visit (INDEPENDENT_AMBULATORY_CARE_PROVIDER_SITE_OTHER): Payer: Self-pay

## 2024-09-02 ENCOUNTER — Encounter: Payer: Self-pay | Admitting: Surgical

## 2024-09-02 ENCOUNTER — Ambulatory Visit: Admitting: Surgical

## 2024-09-02 DIAGNOSIS — Z96611 Presence of right artificial shoulder joint: Secondary | ICD-10-CM | POA: Diagnosis not present

## 2024-09-02 NOTE — Progress Notes (Signed)
 Post-Op Visit Note   Patient: Christian [name]           Date of Birth: 09/10/1951           MRN: [id number] Visit Date: 09/02/2024 PCP: Myrla Jon HERO, MD   Assessment & Plan:  Chief Complaint:  Chief Complaint  Patient presents with   Right Shoulder - Pain   Visit Diagnoses:  1. Status post reverse arthroplasty of right shoulder     Plan: Patient is a 73 year old male who presents s/p right shoulder reverse shoulder arthroplasty revision for instability on 07/25/2024.  Recently came out of his sling as of 08/25/2024 and several days later as he was getting off of a chair felt increased discomfort in the shoulder that has persisted.  He is here today with radiographs demonstrating anterior shoulder dislocation similar to prior event.  He denies any specific injury event or any significant stressing of his shoulder.  Was not really having any pain before that Wednesday but now states he has fairly constant pain.  Has been using sling.  Taking Percocet alternating with tramadol .  On exam, patient has incision that is well-healed over the anterior aspect of the shoulder.  Intact EPL, FPL, finger abduction, pronation/supination, bicep, tricep, deltoid.  Axillary nerve is intact with deltoid firing.  Deformity noted to the shoulder consistent with anterior shoulder dislocation.  No tenderness over the acromion.  No evidence of cellulitis or infection.  2+ radial pulse of the right upper extremity.  Sensation intact over the lateral deltoid.  Impression is right shoulder dislocation of reverse shoulder implant.  Unfortunately, this implant that he has currently is basically the tightest available implant for the current system.  Would need stem revision system in order to try and provide distalization and deltoid tensioning.  Discussed this in depth both with myself and with Dr. Addie today.  We discussed the risks of the surgery and risks of foregoing surgery including but not limited to  the risk of medical complication from surgery, recurrent instability events, need for revision surgery, infection.  Patient and family understand and agree with plan for revision with SRS system to be done on Thursday.  Currently axillary nerve is functioning with deltoid firing but wanted minimize the time that the shoulder is dislocated.  Follow-Up Instructions: No follow-ups on file.   Orders:  Orders Placed This Encounter  Procedures   XR Shoulder Right   No orders of the defined types were placed in this encounter.   Imaging: No results found.  PMFS History: Patient Active Problem List   Diagnosis Date Noted   Instability of prosthetic shoulder joint 07/28/2024   S/P shoulder surgery 07/25/2024   Shoulder arthritis 07/09/2024   Microalbuminuria due to type 2 diabetes mellitus (HCC) 06/14/2024   Skin tag 05/03/2024   Leucocytosis 03/19/2024   Neurogenic claudication due to lumbar spinal stenosis 03/07/2024   Preop examination 02/26/2024   Tachycardia 02/26/2024   Venous stasis ulcer, left lower extremity with lymphedema 02/07/2023   Insulin  dependent type 2 diabetes mellitus (HCC) 01/02/2023   Impingement syndrome, shoulder, right 10/15/2021   Olecranon bursitis, left elbow 09/08/2021   Annual physical exam 06/01/2021   Polyneuropathy associated with underlying disease 05/08/2019   Chronic left shoulder pain 01/02/2019   Hyperlipidemia associated with type 2 diabetes mellitus (HCC) 09/03/2017   Uncontrolled type 2 diabetes mellitus with peripheral neuropathy 01/27/2016   Status post total right knee replacement 05/01/2015   Gout 09/13/2013   Chronic pain syndrome  09/13/2013   Primary osteoarthritis of left knee 09/13/2013   Morbid obesity (HCC) 09/13/2013   Seizures (HCC)    Hypertension    Past Medical History:  Diagnosis Date   Arthritis of left knee    Chronic back pain 09/13/2013   Chronic pain syndrome 09/13/2013   Diabetic ulcer of right ankle (HCC)    GERD  (gastroesophageal reflux disease)    hx. of   Gout 09/13/2013   Hemorrhoids    Hypertension    Morbid obesity with body mass index of 45.0-49.9 in adult [facility]) 09/13/2013   Osteoarthritis 09/13/2013   Pneumonia    hx. of   Seizures (HCC)    Sleep apnea    has never used C-pap machine due to insurance cost   Type 2 diabetes mellitus, uncontrolled     Family History  Problem Relation Age of Onset   Alcohol abuse Father    Arthritis Maternal Grandmother    Alcohol abuse Paternal Grandmother    Alcohol abuse Paternal Grandfather    Diabetes Maternal Aunt     Past Surgical History:  Procedure Laterality Date   broken toe 35 years ago     COLONOSCOPY WITH PROPOFOL  N/A 12/15/2022   Procedure: COLONOSCOPY WITH PROPOFOL ;  Surgeon: Therisa Bi, MD;  Location: [facility] ENDOSCOPY;  Service: Gastroenterology;  Laterality: N/A;   IR EMBO ARTERIAL NOT HEMORR HEMANG INC GUIDE ROADMAPPING  04/27/2023   IR RADIOLOGIST EVAL & MGMT  03/29/2023   IR RADIOLOGIST EVAL & MGMT  08/02/2023   REVERSE SHOULDER ARTHROPLASTY Right 07/09/2024   Procedure: ARTHROPLASTY, SHOULDER, TOTAL, REVERSE;  Surgeon: [name], MD;  Location: [facility] OR;  Service: Orthopedics;  Laterality: Right;   REVISION TOTAL SHOULDER TO REVERSE TOTAL SHOULDER Right 07/25/2024   Procedure: REVISION, REVERSE TOTAL ARTHROPLASTY, SHOULDER;  Surgeon: [name], MD;  Location: [facility] OR;  Service: Orthopedics;  Laterality: Right;  right shoulder open reduction, poly exchange   TOTAL KNEE ARTHROPLASTY Right 05/01/2015   Procedure: RIGHT TOTAL KNEE ARTHROPLASTY;  Surgeon: Lonni CINDERELLA Poli, MD;  Location: [facility] ORS;  Service: Orthopedics;  Laterality: Right;   WISDOM TOOTH EXTRACTION     Social History   Occupational History   Occupation: Retired  Tobacco Use   Smoking status: Former    Current packs/day: 0.00    Average packs/day: 1 pack/day for 42.2 years (42.2 ttl pk-yrs)    Types: Cigarettes    Start date: 12/12/1961    Quit date:  03/08/2004    Years since quitting: 20.5   Smokeless tobacco: Never  Vaping Use   Vaping status: Never Used  Substance and Sexual Activity   Alcohol use: No    Comment: rare   Drug use: No   Sexual activity: Yes    Partners: Female

## 2024-09-03 ENCOUNTER — Ambulatory Visit: Admitting: Internal Medicine

## 2024-09-03 NOTE — Progress Notes (Deleted)
 Patient ID: Christian [name], male   DOB: 1951/06/27, 73 y.o.   MRN: [id number]  HPI Christian [name] is a 73 y.o.-year-old male, returning for f/u for DM2, dx 2009, insulin -dependent since 2014, uncontrolled, with complications (DR, Peripheral neuropathy). Last visit:  4 months ago (virtual). [name] [employer].    Interim history: No increased urination, blurry vision, nausea, chest pain. Since last visit, he had right shoulder arthroplasty 07/09/2024 and revision 07/25/2024.  Reviewed HbA1c levels: Lab Results  Component Value Date   HGBA1C 6.0 (H) 05/03/2024   HGBA1C 6.0 (H) 11/28/2023   HGBA1C 6.3 (A) 07/14/2023   HGBA1C 6.7 (A) 01/30/2023   HGBA1C 7.0 (A) 10/03/2022   HGBA1C 6.5 (A) 05/24/2022   HGBA1C 7.1 (A) 01/17/2022   HGBA1C 8.4 (A) 09/14/2021   HGBA1C 7.7 (A) 05/14/2021   HGBA1C 7.5 (A) 01/12/2021   HGBA1C 7.8 (A) 09/08/2020   HGBA1C 7.0 (A) 05/14/2020   HGBA1C 7.5 (A) 01/09/2020   HGBA1C 6.9 (A) 09/09/2019   HGBA1C 7.6 (A) 05/08/2019   HGBA1C 8.2 (A) 01/08/2019   HGBA1C 7.7 (A) 09/25/2018   HGBA1C 7.4 (A) 05/25/2018   HGBA1C 9.0 02/22/2018   HGBA1C 9.1 11/23/2017  08/24/2017: HbA1c 9%  Pt is on: - Metformin  1000 mg 2x a daily >> 2000 mg with dinner >> at bedtime >> in am - U500 30 min before meals - started 09/2021: 75 >> 80 units before 1st meal of the day 30 >> 40 >> 30-35 units before dinner 30-40 >> 40 >> 30-40 units at bedtime >> stopped Stopped Jardiance  25 mg >> but too expensive - in the donut hole; had yeast infections. Glipizide  and Invokana  were started 03/2014.  He has been on Victoza in the past >> not efficient anymore. We stopped glipizide  in 01/2022. Previously on Lantus  and Humalog  high doses. He stopped Trulicity  01/2022 due to price.  Pt checks his sugars >4x day:  Previously:  Previously:   Lowest sugar was  50s >> 50s, he has hypoglycemia awareness in the 90s Highest sugar was 331 >> ... 200s.  Meter: OneTouch Ultra Mini >>  [name]  -No CKD, last BUN/creatinine:  Lab Results  Component Value Date   BUN 18 07/04/2024   CREATININE 0.88 07/04/2024  Not on ACE inhibitor/ARB.  Normal ACR: Lab Results  Component Value Date   MICRALBCREAT 30-300 06/14/2024   Normal GFR levels: Lab Results  Component Value Date   GFRNONAA >60 07/04/2024   GFRNONAA >60 03/18/2024   GFRNONAA >60 03/14/2024   GFRNONAA >60 03/11/2024   GFRNONAA >60 03/08/2024   GFRNONAA >60 11/28/2023   GFRNONAA >60 04/27/2023   GFRNONAA 84 09/09/2019   GFRNONAA >60 05/02/2015   GFRNONAA >60 04/27/2015   -+ HL; last set of lipids: Lab Results  Component Value Date   CHOL 162 05/03/2024   HDL 43 05/03/2024   LDLCALC 95 05/03/2024   TRIG 138 05/03/2024   CHOLHDL 3.8 05/03/2024  No statins due to leg cramps.  - last eye exam was 12/07/2022: no D, prev. reportedly + DR  -+ numbness and tingling in his feet.  Last foot exam 07/14/2023.  He had R TKR in 04/2015.  He has steroid inj in L knee and L shoulder.  Also uses CBD oil liniment and this helps. She has OSA.  ROS: + See HPI  I reviewed pt's medications, allergies, PMH, social hx, family hx, and changes were documented in the history of present illness. Otherwise, unchanged from my initial visit  note.  Past Medical History:  Diagnosis Date   Arthritis of left knee    Chronic back pain 09/13/2013   Chronic pain syndrome 09/13/2013   Diabetic ulcer of right ankle (HCC)    GERD (gastroesophageal reflux disease)    hx. of   Gout 09/13/2013   Hemorrhoids    Hypertension    Morbid obesity with body mass index of 45.0-49.9 in adult [facility]) 09/13/2013   Osteoarthritis 09/13/2013   Pneumonia    hx. of   Seizures (HCC)    Sleep apnea    has never used C-pap machine due to insurance cost   Type 2 diabetes mellitus, uncontrolled    Past Surgical History:  Procedure Laterality Date   broken toe 35 years ago     COLONOSCOPY WITH PROPOFOL  N/A 12/15/2022   Procedure: COLONOSCOPY WITH  PROPOFOL ;  Surgeon: Therisa Bi, MD;  Location: [facility] ENDOSCOPY;  Service: Gastroenterology;  Laterality: N/A;   IR EMBO ARTERIAL NOT HEMORR HEMANG INC GUIDE ROADMAPPING  04/27/2023   IR RADIOLOGIST EVAL & MGMT  03/29/2023   IR RADIOLOGIST EVAL & MGMT  08/02/2023   REVERSE SHOULDER ARTHROPLASTY Right 07/09/2024   Procedure: ARTHROPLASTY, SHOULDER, TOTAL, REVERSE;  Surgeon: [name], MD;  Location: [facility] OR;  Service: Orthopedics;  Laterality: Right;   REVISION TOTAL SHOULDER TO REVERSE TOTAL SHOULDER Right 07/25/2024   Procedure: REVISION, REVERSE TOTAL ARTHROPLASTY, SHOULDER;  Surgeon: [name], MD;  Location: [facility] OR;  Service: Orthopedics;  Laterality: Right;  right shoulder open reduction, poly exchange   TOTAL KNEE ARTHROPLASTY Right 05/01/2015   Procedure: RIGHT TOTAL KNEE ARTHROPLASTY;  Surgeon: Lonni CINDERELLA Poli, MD;  Location: [facility] ORS;  Service: Orthopedics;  Laterality: Right;   WISDOM TOOTH EXTRACTION     Social History   Socioeconomic History   Marital status: Married    Spouse name: [name]   Number of children: 3   Years of education: 14   Highest education level: Some college, no degree  Occupational History   Occupation: Retired  Tobacco Use   Smoking status: Former    Current packs/day: 0.00    Average packs/day: 1 pack/day for 42.2 years (42.2 ttl pk-yrs)    Types: Cigarettes    Start date: 12/12/1961    Quit date: 03/08/2004    Years since quitting: 20.5   Smokeless tobacco: Never  Vaping Use   Vaping status: Never Used  Substance and Sexual Activity   Alcohol use: No    Comment: rare   Drug use: No   Sexual activity: Yes    Partners: Female  Other Topics Concern   Not on file  Social History Narrative   Lives with wife. He has three children. He enjoys diplomatic services operational officer.   Social Drivers of Corporate Investment Banker Strain: Low Risk  (04/02/2024)   Received from [facility]   Overall Financial Resource Strain (CARDIA)     Difficulty of Paying Living Expenses: Not hard at all  Food Insecurity: No Food Insecurity (07/30/2024)   Hunger Vital Sign    Worried About Running Out of Food in the Last Year: Never true    Ran Out of Food in the Last Year: Never true  Transportation Needs: No Transportation Needs (07/30/2024)   PRAPARE - Administrator, Civil Service (Medical): No    Lack of Transportation (Non-Medical): No  Physical Activity: Inactive (12/11/2023)   Exercise Vital Sign    Days of Exercise per Week: 0 days  Minutes of Exercise per Session: 0 min  Stress: No Stress Concern Present (03/04/2024)   Received from [facility] - Occupational Stress Questionnaire    Feeling of Stress : Only a little  Social Connections: Moderately Isolated (07/27/2024)   Social Connection and Isolation Panel    Frequency of Communication with Friends and Family: Twice a week    Frequency of Social Gatherings with Friends and Family: Three times a week    Attends Religious Services: Never    Active Member of Clubs or Organizations: No    Attends Banker Meetings: Never    Marital Status: Married  Catering Manager Violence: Not At Risk (07/30/2024)   Humiliation, Afraid, Rape, and Kick questionnaire    Fear of Current or Ex-Partner: No    Emotionally Abused: No    Physically Abused: No    Sexually Abused: No   Current Outpatient Medications on File Prior to Visit  Medication Sig Dispense Refill   acetaminophen  (TYLENOL ) 325 MG tablet Take 1-2 tablets (325-650 mg total) by mouth every 6 (six) hours as needed for mild pain (pain score 1-3) or fever (or temp > 100.5). 60 tablet 0   aspirin  EC 81 MG tablet Take 1 tablet (81 mg total) by mouth daily. Swallow whole. (Patient taking differently: Take 81 mg by mouth in the morning. Swallow whole.) 30 tablet 12   BD PEN NEEDLE NANO 2ND GEN 32G X 4 MM MISC USE AS DIRECTED 5 TIMES A DAY WITH INSULIN  INJECTIONS 200  each 5   Continuous Glucose Sensor (FREESTYLE LIBRE 3 SENSOR) MISC by Does not apply route. Use to check glucose continuously change every 14 days     diclofenac  (VOLTAREN ) 75 MG EC tablet Take 75 mg by mouth 2 (two) times daily.     docusate sodium  (COLACE) 100 MG capsule Take 1 capsule (100 mg total) by mouth 2 (two) times daily. (Patient taking differently: Take 100 mg by mouth as needed for mild constipation or moderate constipation.) 10 capsule 0   insulin  regular human CONCENTRATED (HUMULIN  R U-500 KWIKPEN) 500 UNIT/ML KwikPen INJECT 200 UNITS UNDER SKIN A DAY AS ADVISED 24 mL 0   metFORMIN  (GLUCOPHAGE ) 1000 MG tablet TAKE 1 TABLET BY MOUTH TWICE  DAILY WITH A MEAL (Patient taking differently: Take 1,000 mg by mouth in the morning.) 180 tablet 3   Multiple Vitamin (MULTIVITAMIN WITH MINERALS) TABS tablet Take 1 tablet by mouth daily with lunch. (Patient taking differently: Take 1 tablet by mouth every evening.)     oxyCODONE  (OXY IR/ROXICODONE ) 5 MG immediate release tablet Take 1 tablet (5 mg total) by mouth every 4 (four) hours as needed for moderate pain (pain score 4-6) (pain score 4-6). 30 tablet 0   oxyCODONE -acetaminophen  (PERCOCET/ROXICET) 5-325 MG tablet Take 1 tablet by mouth every 6 (six) hours as needed for severe pain (pain score 7-10). 30 tablet 0   tiZANidine  (ZANAFLEX ) 2 MG tablet Take 1 tablet (2 mg total) by mouth every 8 (eight) hours as needed for muscle spasms. (Patient not taking: Reported on 07/30/2024) 30 tablet 0   No current facility-administered medications on file prior to visit.   Allergies  Allergen Reactions   Jardiance  [Empagliflozin ]     Yeast infections   Metoprolol  Other (See Comments)    Caused blood sugars to be elevated and not controlled   Statins     Muscle aches, joint pain   Penicillins Rash    07/25/2024 tolerated  cefazolin    Family History  Problem Relation Age of Onset   Alcohol abuse Father    Arthritis Maternal Grandmother    Alcohol  abuse Paternal Grandmother    Alcohol abuse Paternal Grandfather    Diabetes Maternal Aunt    PE: There were no vitals taken for this visit.  Wt Readings from Last 3 Encounters:  07/25/24 270 lb (122.5 kg)  07/09/24 289 lb (131.1 kg)  07/04/24 289 lb (131.1 kg)   Constitutional: obese, in NAD, in motorized wheelchair Eyes: EOMI, no exophthalmos ENT: no thyromegaly, no cervical lymphadenopathy Cardiovascular: RRR, No MRG, + LE periankle edema L>R Respiratory: CTA B Musculoskeletal: no deformities Skin:+ stasis dermatitis rash bilateral lower legs Neurological: no tremor with outstretched hands  ASSESSMENT: 1. DM2, insulin -dependent, uncontrolled, with complications - PN - stable, worse in L foot - DR  2. PN  3.  Obesity class III  PLAN:  1. Patient with history of uncontrolled type 2 diabetes, previously on a complex medication regimen and U-500 insulin .  He had to come off Trulicity  as this was not covered for him.  Per his preference, he did not try another GLP-1 receptor agonist.  At last visit, sugars were mostly fluctuating within the target range, with only occasional higher blood sugars after lunch, but more consistent hyperglycemia after dinner, peaking between 11 PM and 2 AM.  Afterwards, sugars were dropping with occasional low blood sugars between 5 and 6 in the morning.  He confirmed that he was not taking insulin  at bedtime.  I advised him to reduce his U-500 insulin  with dinner.  I again advised him to make sure that he is taking the insulin  30 minutes before meals. CGM interpretation: -At today's visit, we reviewed his CGM downloads: It appears that *** of values are in target range (goal >70%), while *** are higher than 180 (goal <25%), and *** are lower than 70 (goal <4%).  The calculated average blood sugar is ***.  The projected HbA1c for the next 3 months (GMI) is ***. -Reviewing the CGM trends, *** - I advised him to: Patient Instructions  Please continue: -  Metformin  2000 mg with the first meal of the day - U500 30 min before meals  80 units before 1st meal of the day  30-35 units before dinner  Please return in 3-4 months.  - we checked his HbA1c: 7%  - advised to check sugars at different times of the day - 4x a day, rotating check times - advised for yearly eye exams >> he is UTD - return to clinic in 3-4 months  2. HL - At last check, LDL was elevated above our target of less than 70, otherwise fractions at goal: Lab Results  Component Value Date   CHOL 162 05/03/2024   HDL 43 05/03/2024   LDLCALC 95 05/03/2024   TRIG 138 05/03/2024   CHOLHDL 3.8 05/03/2024  -He could not tolerate statins due to leg cramps.  I did suggest ezetimibe in the past but he did not start.  3.  Obesity class III - Previously saw nutrition - Unfortunately, he had to come off Trulicity  due to price  **Pulses not palpable Decreased sensation to monofilament throughout Indurated skin  Lela Fendt, MD PhD [facility] Endocrinology

## 2024-09-04 ENCOUNTER — Encounter (HOSPITAL_COMMUNITY): Payer: Self-pay | Admitting: Orthopedic Surgery

## 2024-09-04 ENCOUNTER — Other Ambulatory Visit: Payer: Self-pay

## 2024-09-04 NOTE — Anesthesia Preprocedure Evaluation (Signed)
 Anesthesia Evaluation  Patient identified by MRN, date of birth, ID band Patient awake    Reviewed: Allergy & Precautions, NPO status , Patient's Chart, lab work & pertinent test results  History of Anesthesia Complications Negative for: history of anesthetic complications  Airway Mallampati: II  TM Distance: >3 FB Neck ROM: Full    Dental  (+) Poor Dentition, Dental Advisory Given   Pulmonary sleep apnea (does not use CPAP) , former smoker   breath sounds clear to auscultation       Cardiovascular hypertension (not on BP meds), (-) angina  Rhythm:Regular Rate:Normal  08/12/2024 ECHO: EF 50 to 55%.  1. The  left ventricle has low normal function. Left ventricular endocardial  border not optimally defined to evaluate regional wall motion. There is  mild left ventricular hypertrophy. Left  ventricular diastolic parameters are consistent with Grade I diastolic  dysfunction (impaired relaxation).   3. Right ventricular systolic function was not well visualized. The right  ventricular size is not well visualized.      Neuro/Psych    GI/Hepatic Neg liver ROS,GERD  Controlled,,  Endo/Other  diabetes (glu 139), Oral Hypoglycemic Agents, Insulin  Dependent  BMI 44  Renal/GU Renal InsufficiencyRenal disease     Musculoskeletal  (+) Arthritis ,    Abdominal   Peds  Hematology Hb 12.6, plt 267k   Anesthesia Other Findings   Reproductive/Obstetrics                              Anesthesia Physical Anesthesia Plan  ASA: 3  Anesthesia Plan: General   Post-op Pain Management: Regional block* and Ofirmev  IV (intra-op)*   Induction: Intravenous  PONV Risk Score and Plan: 2 and Ondansetron  and Dexamethasone   Airway Management Planned: Oral ETT  Additional Equipment: None  Intra-op Plan:   Post-operative Plan: Extubation in OR  Informed Consent: I have reviewed the patients History and  Physical, chart, labs and discussed the procedure including the risks, benefits and alternatives for the proposed anesthesia with the patient or authorized representative who has indicated his/her understanding and acceptance.     Dental advisory given  Plan Discussed with: CRNA and Surgeon  Anesthesia Plan Comments: (Plan routine monitors, GETA with interscalene block for post op analgesia Discussed risks, benefits of interscalene with pt, including, but not limited to phrenic nerve paresis and breathing difficulties  PAT note by Lynwood Hope, PA-C: 73 yo male with pertinent hx including former smoker (40 pack years, quit 2005), HTN, baseline sinus tachycardia, OSA noncompliant with CPAP, HLD, insulin -dependent diabetes on u500 insulin  (A1c 6.0 on 05/03/24), polyneuropathy, chronic pain, uses wheelchair outside the house due to orthopedic pain, class 3 obesity BMI 47, remote hx of seizures (reportedly ~48 years go, none since).  He was seen in the preop clinic at [facility] on 02/21/24 and echo was ordered at that time for eval of DOE. TTE 02/23/24 showed LVEF 45-50%, severe concentric hypertrophy, normal RV, normal valves.Subsequently underwent L4-S1 TLIF 03/04/24 at [facility]. Per notes, glidescope used electively.   Seen by cardiologist Dr. Darliss on 07/01/24 at the request of Dr. Addie for preop eval. Per note, 1.Preop evaluation, EKG shows sinus rhythm, first-degree AV block, incomplete right bundle branch block.  Patient denies chest pain.  Shortness of breath likely from morbid obesity.  He ambulates with a scooter.  Okay for right shoulder surgery from a cardiac perspective.  Shoulder surgery typically deemed low risk from a cardiac perspective. 2.Complete right  bundle branch block noted on ECG.  Obtain echocardiogram.  Echo scheduling should not withhold surgery per #1 above.  Subsequent echo 08/12/2024 showed LVEF 50 to 55%, grade 1 DD, normal RV, no significant valvular  abnormalities.  Patient underwent right reverse shoulder arthroplasty 07/09/2024 and had subsequent dislocation requiring revision 07/25/2024.  Unfortunately, he has suffered another dislocation requiring further revision.  He will need day of surgery labs and evaluation.  EKG 07/01/24: Sinus tachycardia with 1st degree A-V block with occasional Premature ventricular complexes. Rate 106. Left axis deviation. Incomplete right bundle branch block  TTE 08/12/2024: 1. Very technically difficult study despite contrast opacification.  2. Left ventricular ejection fraction, by estimation, is 50 to 55%. The  left ventricle has low normal function. Left ventricular endocardial  border not optimally defined to evaluate regional wall motion. There is  mild left ventricular hypertrophy. Left  ventricular diastolic parameters are consistent with Grade I diastolic  dysfunction (impaired relaxation).  3. Right ventricular systolic function was not well visualized. The right  ventricular size is not well visualized.    )         Anesthesia Quick Evaluation

## 2024-09-04 NOTE — Progress Notes (Signed)
 Anesthesia Chart Review: Same day workup  73 yo male with pertinent hx including former smoker (40 pack years, quit 2005), HTN, baseline sinus tachycardia, OSA noncompliant with CPAP, HLD, insulin -dependent diabetes on u500 insulin  (A1c 6.0 on 05/03/24), polyneuropathy, chronic pain, uses wheelchair outside the house due to orthopedic pain, class 3 obesity BMI 47, remote hx of seizures (reportedly ~48 years go, none since).   He was seen in the preop clinic at [facility] on 02/21/24 and echo was ordered at that time for eval of DOE. TTE 02/23/24 showed LVEF 45-50%, severe concentric hypertrophy, normal RV, normal valves.Subsequently underwent L4-S1 TLIF 03/04/24 at [facility]. Per notes, glidescope used electively.    Seen by cardiologist Dr. Darliss on 07/01/24 at the request of Dr. Addie for preop eval. Per note, 1.Preop evaluation, EKG shows sinus rhythm, first-degree AV block, incomplete right bundle branch block.  Patient denies chest pain.  Shortness of breath likely from morbid obesity.  He ambulates with a scooter.  Okay for right shoulder surgery from a cardiac perspective.  Shoulder surgery typically deemed low risk from a cardiac perspective. 2.Complete right bundle branch block noted on ECG.  Obtain echocardiogram.  Echo scheduling should not withhold surgery per #1 above.  Subsequent echo 08/12/2024 showed LVEF 50 to 55%, grade 1 DD, normal RV, no significant valvular abnormalities.  Patient underwent right reverse shoulder arthroplasty 07/09/2024 and had subsequent dislocation requiring revision 07/25/2024.  Unfortunately, he has suffered another dislocation requiring further revision.   He will need day of surgery labs and evaluation.   EKG 07/01/24: Sinus tachycardia with 1st degree A-V block with occasional Premature ventricular complexes. Rate 106. Left axis deviation. Incomplete right bundle branch block   TTE 08/12/2024: 1. Very technically difficult study despite contrast opacification.   2.  Left ventricular ejection fraction, by estimation, is 50 to 55%. The  left ventricle has low normal function. Left ventricular endocardial  border not optimally defined to evaluate regional wall motion. There is  mild left ventricular hypertrophy. Left  ventricular diastolic parameters are consistent with Grade I diastolic  dysfunction (impaired relaxation).   3. Right ventricular systolic function was not well visualized. The right  ventricular size is not well visualized.     Lynwood Geofm RIGGERS [facility] Short Stay Center/Anesthesiology Phone [phone] 09/04/2024 10:33 AM

## 2024-09-04 NOTE — Progress Notes (Addendum)
 SDW CALL  Patient was given pre-op instructions over the phone. The opportunity was given for the patient to ask questions. No further questions asked. Patient verbalized understanding of instructions given.   PCP - Myrla Jon HERO, MD Cardiologist - Redell Bandy Etang,MD  PPM/ICD - denies Device Orders -  Rep Notified -   Chest x-ray - na EKG - 07/01/24  Stress Test - denies ECHO - 08/12/24  Cardiac Cath - denies  Sleep Study - +OSA CPAP -   Fasting Blood Sugar - 90-135 Checks Blood Sugar _____ times a day  WHAT DO I DO ABOUT MY DIABETES MEDICATION?   Do not take oral diabetes medicines (pills) the morning of surgery.       Do not take metFORMIN  (GLUCOPHAGE ) the morning of surgery.    Contact PCP/endocrinologist regarding dose reduction for Humulin  RU-500 insulin  the morning of surgery or consider holding all doses day of surgery until able to eat.   Check your blood sugar the morning of your surgery when you wake up and every 2 hours until you get to the Short Stay unit.  If your blood sugar is less than 70 mg/dL, you will need to treat for low blood sugar: Do not take insulin . Treat a low blood sugar (less than 70 mg/dL) with  cup of clear juice (cranberry or apple), 4 glucose tablets, OR glucose gel. Recheck blood sugar in 15 minutes after treatment (to make sure it is greater than 70 mg/dL). If your blood sugar is not greater than 70 mg/dL on recheck, call [phone] for further instructions. Report your blood sugar to the short stay nurse when you get to Short Stay.  Blood Thinner Instructions:na Aspirin  Instructions: pt reports last dose of aspirin  was 10/28  ERAS Protcol -clears until 0430 PRE-SURGERY Ensure or G2- no  COVID TEST- na   Anesthesia review: yes- hx HTN,sleep apnea,DM2  Patient denies shortness of breath, fever, cough and chest pain over the phone call   Special instructions:    Oral Hygiene is also important to reduce your risk of  infection.  Remember - BRUSH YOUR TEETH THE MORNING OF SURGERY WITH YOUR REGULAR TOOTHPASTE

## 2024-09-05 ENCOUNTER — Encounter (HOSPITAL_COMMUNITY)
Admission: AD | Disposition: A | Discharge status: Home / Self Care | Payer: Self-pay | Source: Home / Self Care | Attending: Orthopedic Surgery

## 2024-09-05 ENCOUNTER — Inpatient Hospital Stay (HOSPITAL_COMMUNITY)

## 2024-09-05 ENCOUNTER — Other Ambulatory Visit: Payer: Self-pay

## 2024-09-05 ENCOUNTER — Inpatient Hospital Stay (HOSPITAL_COMMUNITY): Payer: Self-pay | Admitting: Physician Assistant

## 2024-09-05 ENCOUNTER — Ambulatory Visit (HOSPITAL_COMMUNITY): Payer: Self-pay | Admitting: Physician Assistant

## 2024-09-05 ENCOUNTER — Observation Stay (HOSPITAL_COMMUNITY)
Admission: AD | Admit: 2024-09-05 | Discharge: 2024-09-07 | Disposition: A | Discharge status: Home / Self Care | Attending: Orthopedic Surgery | Admitting: Orthopedic Surgery

## 2024-09-05 ENCOUNTER — Encounter (HOSPITAL_COMMUNITY): Payer: Self-pay | Admitting: Orthopedic Surgery

## 2024-09-05 DIAGNOSIS — Z01818 Encounter for other preprocedural examination: Principal | ICD-10-CM

## 2024-09-05 DIAGNOSIS — S43004A Unspecified dislocation of right shoulder joint, initial encounter: Secondary | ICD-10-CM | POA: Diagnosis not present

## 2024-09-05 DIAGNOSIS — Z87891 Personal history of nicotine dependence: Secondary | ICD-10-CM | POA: Insufficient documentation

## 2024-09-05 DIAGNOSIS — T84028D Dislocation of other internal joint prosthesis, subsequent encounter: Secondary | ICD-10-CM | POA: Diagnosis not present

## 2024-09-05 DIAGNOSIS — Z96619 Presence of unspecified artificial shoulder joint: Secondary | ICD-10-CM | POA: Diagnosis not present

## 2024-09-05 DIAGNOSIS — Z96611 Presence of right artificial shoulder joint: Secondary | ICD-10-CM | POA: Diagnosis not present

## 2024-09-05 DIAGNOSIS — E119 Type 2 diabetes mellitus without complications: Secondary | ICD-10-CM | POA: Insufficient documentation

## 2024-09-05 DIAGNOSIS — I10 Essential (primary) hypertension: Secondary | ICD-10-CM | POA: Insufficient documentation

## 2024-09-05 DIAGNOSIS — T84028A Dislocation of other internal joint prosthesis, initial encounter: Secondary | ICD-10-CM

## 2024-09-05 DIAGNOSIS — Z7982 Long term (current) use of aspirin: Secondary | ICD-10-CM | POA: Diagnosis not present

## 2024-09-05 DIAGNOSIS — Z794 Long term (current) use of insulin: Secondary | ICD-10-CM | POA: Insufficient documentation

## 2024-09-05 DIAGNOSIS — M24411 Recurrent dislocation, right shoulder: Principal | ICD-10-CM | POA: Insufficient documentation

## 2024-09-05 DIAGNOSIS — M19011 Primary osteoarthritis, right shoulder: Secondary | ICD-10-CM | POA: Diagnosis not present

## 2024-09-05 DIAGNOSIS — G8918 Other acute postprocedural pain: Secondary | ICD-10-CM | POA: Diagnosis not present

## 2024-09-05 HISTORY — PX: REVISION TOTAL SHOULDER TO REVERSE TOTAL SHOULDER: SHX6313

## 2024-09-05 LAB — BASIC METABOLIC PANEL WITH GFR
Anion gap: 11 (ref 5–15)
BUN: 15 mg/dL (ref 8–23)
CO2: 20 mmol/L — ABNORMAL LOW (ref 22–32)
Calcium: 9 mg/dL (ref 8.9–10.3)
Chloride: 105 mmol/L (ref 98–111)
Creatinine, Ser: 0.74 mg/dL (ref 0.61–1.24)
GFR, Estimated: >60 mL/min (ref >60)
Glucose, Bld: 153 mg/dL — ABNORMAL HIGH (ref 70–99)
Potassium: 3.9 mmol/L (ref 3.5–5.1)
Sodium: 136 mmol/L (ref 135–145)

## 2024-09-05 LAB — SURGICAL PCR SCREEN
MRSA, PCR: NEGATIVE
Staphylococcus aureus: NEGATIVE

## 2024-09-05 LAB — GLUCOSE, CAPILLARY
Glucose-Capillary: 165 mg/dL — ABNORMAL HIGH (ref 70–99)
Glucose-Capillary: 139 mg/dL — ABNORMAL HIGH (ref 70–99)
Glucose-Capillary: 162 mg/dL — ABNORMAL HIGH (ref 70–99)
Glucose-Capillary: 162 mg/dL — ABNORMAL HIGH (ref 70–99)

## 2024-09-05 LAB — CBC
HCT: 39.5 % (ref 39.0–52.0)
Hemoglobin: 12.6 g/dL — ABNORMAL LOW (ref 13.0–17.0)
MCH: 31.2 pg (ref 26.0–34.0)
MCHC: 31.9 g/dL (ref 30.0–36.0)
MCV: 97.8 fL (ref 80.0–100.0)
Platelets: 267 K/uL (ref 150–400)
RBC: 4.04 MIL/uL — ABNORMAL LOW (ref 4.22–5.81)
RDW: 14.1 % (ref 11.5–15.5)
WBC: 11.3 K/uL — ABNORMAL HIGH (ref 4.0–10.5)
nRBC: 0 % (ref 0.0–0.2)

## 2024-09-05 SURGERY — REVISION, REVERSE TOTAL ARTHROPLASTY, SHOULDER
Anesthesia: Regional | Site: Shoulder | Laterality: Right

## 2024-09-05 MED ORDER — PHENOL 1.4 % MT LIQD: 1.0000 | OROMUCOSAL | Status: DC | PRN

## 2024-09-05 MED ORDER — OXYCODONE HCL 5 MG PO TABS
5.0000 mg | ORAL_TABLET | ORAL | Status: DC | PRN
  Administered 2024-09-06 (×2): 5 mg via ORAL
  Administered 2024-09-07: 10 mg via ORAL
  Filled 2024-09-05 (×2): qty 2
  Filled 2024-09-05: qty 1

## 2024-09-05 MED ORDER — ONDANSETRON HCL 4 MG PO TABS
4.0000 mg | ORAL_TABLET | Freq: Four times a day (QID) | ORAL | Status: DC | PRN
Start: 2024-09-05 — End: 2024-09-07

## 2024-09-05 MED ORDER — CEFAZOLIN SODIUM-DEXTROSE 3-4 GM/150ML-% IV SOLN
INTRAVENOUS | Status: AC
Start: 2024-09-05 — End: 2024-09-05
  Filled 2024-09-05: qty 150

## 2024-09-05 MED ORDER — ROCURONIUM BROMIDE 10 MG/ML (PF) SYRINGE
PREFILLED_SYRINGE | INTRAVENOUS | Status: DC | PRN
  Administered 2024-09-05: 70 mg via INTRAVENOUS

## 2024-09-05 MED ORDER — HYDROMORPHONE HCL 1 MG/ML IJ SOLN
INTRAMUSCULAR | Status: AC
  Filled 2024-09-05: qty 0.5

## 2024-09-05 MED ORDER — ONDANSETRON HCL 4 MG/2ML IJ SOLN
INTRAMUSCULAR | Status: DC | PRN
  Administered 2024-09-05: 4 mg via INTRAVENOUS

## 2024-09-05 MED ORDER — CEFAZOLIN SODIUM-DEXTROSE 3-4 GM/150ML-% IV SOLN
3.0000 g | INTRAVENOUS | Status: AC
  Administered 2024-09-05: 3 g via INTRAVENOUS

## 2024-09-05 MED ORDER — POVIDONE-IODINE 7.5 % EX SOLN: Freq: Once | CUTANEOUS | Status: DC

## 2024-09-05 MED ORDER — MENTHOL 3 MG MT LOZG: 1.0000 | LOZENGE | OROMUCOSAL | Status: DC | PRN

## 2024-09-05 MED ORDER — PROPOFOL 10 MG/ML IV BOLUS
INTRAVENOUS | Status: DC | PRN
  Administered 2024-09-05: 150 mg via INTRAVENOUS

## 2024-09-05 MED ORDER — MIDAZOLAM HCL (PF) 2 MG/2ML IJ SOLN: 1.0000 mg | Freq: Once | INTRAMUSCULAR | Status: AC

## 2024-09-05 MED ORDER — MIDAZOLAM HCL (PF) 2 MG/2ML IJ SOLN: 0.5000 mg | Freq: Once | INTRAMUSCULAR | Status: DC | PRN

## 2024-09-05 MED ORDER — METFORMIN HCL 500 MG PO TABS
1000.0000 mg | ORAL_TABLET | Freq: Two times a day (BID) | ORAL | Status: DC
  Administered 2024-09-06 (×2): 1000 mg via ORAL
  Filled 2024-09-05 (×3): qty 2

## 2024-09-05 MED ORDER — PHENYLEPHRINE 80 MCG/ML (10ML) SYRINGE FOR IV PUSH (FOR BLOOD PRESSURE SUPPORT)
PREFILLED_SYRINGE | INTRAVENOUS | Status: DC | PRN
  Administered 2024-09-05: 240 ug via INTRAVENOUS
  Administered 2024-09-05: 160 ug via INTRAVENOUS

## 2024-09-05 MED ORDER — HYDROMORPHONE HCL 1 MG/ML IJ SOLN
INTRAMUSCULAR | Status: DC | PRN
  Administered 2024-09-05 (×2): .5 mg via INTRAVENOUS

## 2024-09-05 MED ORDER — BUPIVACAINE LIPOSOME 1.3 % IJ SUSP
INTRAMUSCULAR | Status: DC | PRN
Start: 2024-09-05 — End: 2024-09-05
  Administered 2024-09-05: 10 mL via PERINEURAL

## 2024-09-05 MED ORDER — MIDAZOLAM HCL 2 MG/2ML IJ SOLN
INTRAMUSCULAR | Status: AC
  Administered 2024-09-05: 1 mg via INTRAVENOUS
  Filled 2024-09-05: qty 2

## 2024-09-05 MED ORDER — LACTATED RINGERS IV SOLN: INTRAVENOUS | Status: DC

## 2024-09-05 MED ORDER — CELECOXIB 100 MG PO CAPS
100.0000 mg | ORAL_CAPSULE | Freq: Two times a day (BID) | ORAL | Status: DC
  Administered 2024-09-06 (×2): 100 mg via ORAL
  Filled 2024-09-05 (×3): qty 1

## 2024-09-05 MED ORDER — SUCCINYLCHOLINE CHLORIDE 200 MG/10ML IV SOSY
PREFILLED_SYRINGE | INTRAVENOUS | Status: DC | PRN
  Administered 2024-09-05: 120 mg via INTRAVENOUS

## 2024-09-05 MED ORDER — FENTANYL CITRATE (PF) 100 MCG/2ML IJ SOLN: 50.0000 ug | Freq: Once | INTRAMUSCULAR | Status: AC

## 2024-09-05 MED ORDER — HYDROMORPHONE HCL 1 MG/ML IJ SOLN: 0.5000 mg | INTRAMUSCULAR | Status: DC | PRN

## 2024-09-05 MED ORDER — CEFAZOLIN SODIUM-DEXTROSE 2-4 GM/100ML-% IV SOLN
2.0000 g | Freq: Three times a day (TID) | INTRAVENOUS | Status: DC
  Administered 2024-09-05 – 2024-09-07 (×5): 2 g via INTRAVENOUS
  Filled 2024-09-05 (×5): qty 100

## 2024-09-05 MED ORDER — ACETAMINOPHEN 500 MG PO TABS
1000.0000 mg | ORAL_TABLET | Freq: Four times a day (QID) | ORAL | Status: AC
Start: 2024-09-06 — End: 2024-09-06
  Administered 2024-09-05 – 2024-09-06 (×4): 1000 mg via ORAL
  Filled 2024-09-05 (×4): qty 2

## 2024-09-05 MED ORDER — FENTANYL CITRATE (PF) 100 MCG/2ML IJ SOLN
INTRAMUSCULAR | Status: AC
  Administered 2024-09-05: 50 ug via INTRAVENOUS
  Filled 2024-09-05: qty 2

## 2024-09-05 MED ORDER — ASPIRIN 81 MG PO TBEC
81.0000 mg | DELAYED_RELEASE_TABLET | Freq: Every day | ORAL | Status: DC
Start: 2024-09-06 — End: 2024-09-07
  Administered 2024-09-06 – 2024-09-07 (×2): 81 mg via ORAL
  Filled 2024-09-05 (×3): qty 1

## 2024-09-05 MED ORDER — SODIUM CHLORIDE 0.9 % IV SOLN: INTRAVENOUS | Status: DC

## 2024-09-05 MED ORDER — ORAL CARE MOUTH RINSE: 15.0000 mL | Freq: Once | OROMUCOSAL | Status: AC

## 2024-09-05 MED ORDER — VANCOMYCIN HCL 1000 MG IV SOLR
INTRAVENOUS | Status: DC | PRN
  Administered 2024-09-05: 1000 mg via TOPICAL

## 2024-09-05 MED ORDER — ALBUMIN HUMAN 5 % IV SOLN: INTRAVENOUS | Status: DC | PRN

## 2024-09-05 MED ORDER — MEPERIDINE HCL 25 MG/ML IJ SOLN
6.2500 mg | INTRAMUSCULAR | Status: DC | PRN
  Filled 2024-09-05: qty 1

## 2024-09-05 MED ORDER — INSULIN ASPART 100 UNIT/ML IJ SOLN: 0.0000 [IU] | INTRAMUSCULAR | Status: DC | PRN

## 2024-09-05 MED ORDER — 0.9 % SODIUM CHLORIDE (POUR BTL) OPTIME
TOPICAL | Status: DC | PRN
  Administered 2024-09-05: 1000 mL

## 2024-09-05 MED ORDER — CHLORHEXIDINE GLUCONATE 0.12 % MT SOLN
OROMUCOSAL | Status: AC
Start: 2024-09-05 — End: 2024-09-05
  Administered 2024-09-05: 15 mL via OROMUCOSAL
  Filled 2024-09-05: qty 15

## 2024-09-05 MED ORDER — METHOCARBAMOL 1000 MG/10ML IJ SOLN: 500.0000 mg | Freq: Four times a day (QID) | INTRAMUSCULAR | Status: DC | PRN

## 2024-09-05 MED ORDER — DEXAMETHASONE SOD PHOSPHATE PF 10 MG/ML IJ SOLN
INTRAMUSCULAR | Status: DC | PRN
  Administered 2024-09-05: 4 mg via INTRAVENOUS

## 2024-09-05 MED ORDER — CHLORHEXIDINE GLUCONATE 0.12 % MT SOLN: 15.0000 mL | Freq: Once | OROMUCOSAL | Status: AC

## 2024-09-05 MED ORDER — OXYCODONE HCL 5 MG/5ML PO SOLN: 5.0000 mg | Freq: Once | ORAL | Status: DC | PRN

## 2024-09-05 MED ORDER — SUGAMMADEX SODIUM 200 MG/2ML IV SOLN
INTRAVENOUS | Status: DC | PRN
  Administered 2024-09-05 (×2): 200 mg via INTRAVENOUS

## 2024-09-05 MED ORDER — DEXMEDETOMIDINE HCL IN NACL 80 MCG/20ML IV SOLN
INTRAVENOUS | Status: DC | PRN
  Administered 2024-09-05: 8 ug via INTRAVENOUS

## 2024-09-05 MED ORDER — OXYCODONE HCL 5 MG PO TABS: 5.0000 mg | ORAL_TABLET | Freq: Once | ORAL | Status: DC | PRN

## 2024-09-05 MED ORDER — POVIDONE-IODINE 10 % EX SWAB
2.0000 | Freq: Once | CUTANEOUS | Status: AC
  Administered 2024-09-05: 2 via TOPICAL

## 2024-09-05 MED ORDER — METHOCARBAMOL 500 MG PO TABS
500.0000 mg | ORAL_TABLET | Freq: Four times a day (QID) | ORAL | Status: DC | PRN
  Administered 2024-09-06: 500 mg via ORAL
  Filled 2024-09-05: qty 1

## 2024-09-05 MED ORDER — METOCLOPRAMIDE HCL 5 MG PO TABS: 5.0000 mg | ORAL_TABLET | Freq: Three times a day (TID) | ORAL | Status: DC | PRN

## 2024-09-05 MED ORDER — IRRISEPT - 450ML BOTTLE WITH 0.05% CHG IN STERILE WATER, USP 99.95% OPTIME
TOPICAL | Status: DC | PRN
Start: 2024-09-05 — End: 2024-09-05
  Administered 2024-09-05: 450 mL

## 2024-09-05 MED ORDER — VANCOMYCIN HCL 1000 MG IV SOLR
INTRAVENOUS | Status: AC
  Filled 2024-09-05: qty 20

## 2024-09-05 MED ORDER — PROPOFOL 10 MG/ML IV BOLUS
INTRAVENOUS | Status: AC
Start: 2024-09-05 — End: 2024-09-05
  Filled 2024-09-05: qty 20

## 2024-09-05 MED ORDER — PHENYLEPHRINE HCL-NACL 20-0.9 MG/250ML-% IV SOLN
INTRAVENOUS | Status: DC | PRN
  Administered 2024-09-05: 30 ug/min via INTRAVENOUS

## 2024-09-05 MED ORDER — ACETAMINOPHEN 325 MG PO TABS: 325.0000 mg | ORAL_TABLET | Freq: Four times a day (QID) | ORAL | Status: DC | PRN

## 2024-09-05 MED ORDER — STERILE WATER FOR IRRIGATION IR SOLN
Status: DC | PRN
  Administered 2024-09-05: 1000 mL

## 2024-09-05 MED ORDER — BUPIVACAINE-EPINEPHRINE (PF) 0.5% -1:200000 IJ SOLN
INTRAMUSCULAR | Status: DC | PRN
  Administered 2024-09-05: 10 mL via PERINEURAL

## 2024-09-05 MED ORDER — HYDROMORPHONE HCL 1 MG/ML IJ SOLN: 0.2500 mg | INTRAMUSCULAR | Status: DC | PRN

## 2024-09-05 MED ORDER — DOCUSATE SODIUM 100 MG PO CAPS
100.0000 mg | ORAL_CAPSULE | Freq: Two times a day (BID) | ORAL | Status: DC
  Administered 2024-09-05 – 2024-09-06 (×3): 100 mg via ORAL
  Filled 2024-09-05 (×4): qty 1

## 2024-09-05 MED ORDER — METOCLOPRAMIDE HCL 5 MG/ML IJ SOLN: 5.0000 mg | Freq: Three times a day (TID) | INTRAMUSCULAR | Status: DC | PRN

## 2024-09-05 SURGICAL SUPPLY — 56 items
ALCOHOL 70% 16 OZ (MISCELLANEOUS) ×2 IMPLANT
BAG COUNTER SPONGE SURGICOUNT (BAG) ×2 IMPLANT
BEARING HUMERAL SHOULDER 40 +3 (Joint) IMPLANT
BLADE SAW SGTL 13X75X1.27 (BLADE) ×2 IMPLANT
BNDG ELASTIC 6X10 VLCR STRL LF (GAUZE/BANDAGES/DRESSINGS) IMPLANT
CANISTER WOUNDNEG PRESSURE 500 (CANNISTER) IMPLANT
CHLORAPREP W/TINT 26 (MISCELLANEOUS) ×2 IMPLANT
COMPONENT SHLDR PROXIML BDY LG (Shoulder) IMPLANT
COOLER ICEMAN CLASSIC (MISCELLANEOUS) ×2 IMPLANT
COVER SURGICAL LIGHT HANDLE (MISCELLANEOUS) ×2 IMPLANT
DRAPE INCISE IOBAN 66X45 STRL (DRAPES) ×2 IMPLANT
DRAPE SURG ORHT 6 SPLT 77X108 (DRAPES) ×4 IMPLANT
DRAPE U-SHAPE 47X51 STRL (DRAPES) ×4 IMPLANT
DRESSING PEEL AND PLAC PRVNA20 (GAUZE/BANDAGES/DRESSINGS) IMPLANT
DRSG AQUACEL AG ADV 3.5X10 (GAUZE/BANDAGES/DRESSINGS) ×2 IMPLANT
ELECTRODE BLDE 4.0 EZ CLN MEGD (MISCELLANEOUS) ×2 IMPLANT
ELECTRODE REM PT RTRN 9FT ADLT (ELECTROSURGICAL) ×2 IMPLANT
GAUZE SPONGE 4X4 12PLY STRL LF (GAUZE/BANDAGES/DRESSINGS) ×2 IMPLANT
GLOVE BIOGEL PI IND STRL 6.5 (GLOVE) ×2 IMPLANT
GLOVE BIOGEL PI IND STRL 8 (GLOVE) ×2 IMPLANT
GLOVE ECLIPSE 6.5 STRL STRAW (GLOVE) ×2 IMPLANT
GLOVE ECLIPSE 8.0 STRL XLNG CF (GLOVE) ×2 IMPLANT
GOWN STRL REUS W/ TWL LRG LVL3 (GOWN DISPOSABLE) ×2 IMPLANT
GOWN STRL REUS W/ TWL XL LVL3 (GOWN DISPOSABLE) ×2 IMPLANT
HYDROGEN PEROXIDE 16OZ (MISCELLANEOUS) ×2 IMPLANT
KIT BASIN OR (CUSTOM PROCEDURE TRAY) ×2 IMPLANT
KIT DRSG PREVENA PLUS 7DAY 125 (MISCELLANEOUS) IMPLANT
KIT TURNOVER KIT B (KITS) ×2 IMPLANT
LAVAGE JET IRRISEPT WOUND (IRRIGATION / IRRIGATOR) ×2 IMPLANT
MANIFOLD NEPTUNE II (INSTRUMENTS) ×2 IMPLANT
NDL SUT 6 .5 CRC .975X.05 MAYO (NEEDLE) IMPLANT
PACK SHOULDER (CUSTOM PROCEDURE TRAY) ×2 IMPLANT
PAD COLD SHLDR WRAP-ON (PAD) ×2 IMPLANT
PENCIL SMOKE EVACUATOR (MISCELLANEOUS) IMPLANT
RESTRAINT HEAD UNIVERSAL NS (MISCELLANEOUS) ×2 IMPLANT
RETRIEVER SUT HEWSON (MISCELLANEOUS) ×2 IMPLANT
SET INTERPULSE LAVAGE W/TIP (ORTHOPEDIC DISPOSABLE SUPPLIES) IMPLANT
SLING ARM IMMOBILIZER LRG (SOFTGOODS) ×2 IMPLANT
SOL PREP POV-IOD 4OZ 10% (MISCELLANEOUS) ×2 IMPLANT
SOLN 0.9% NACL POUR BTL 1000ML (IV SOLUTION) ×2 IMPLANT
SOLN STERILE WATER BTL 1000 ML (IV SOLUTION) ×2 IMPLANT
SPONGE T-LAP 18X18 ~~LOC~~+RFID (SPONGE) ×2 IMPLANT
STEM HUM CMT SHLD 100X14 0D (Stem) IMPLANT
STRIP CLOSURE SKIN 1/2X4 (GAUZE/BANDAGES/DRESSINGS) ×2 IMPLANT
SUCTION TUBE FRAZIER 10FR DISP (SUCTIONS) ×2 IMPLANT
SUT ETHILON 3 0 PS 1 (SUTURE) IMPLANT
SUT MNCRL AB 3-0 PS2 18 (SUTURE) ×2 IMPLANT
SUT SILK 2 0 TIES 10X30 (SUTURE) ×2 IMPLANT
SUT VIC AB 0 CT1 27XBRD ANBCTR (SUTURE) ×8 IMPLANT
SUT VIC AB 0 CT1 36 (SUTURE) IMPLANT
SUT VIC AB 1 CT1 27XBRD ANBCTR (SUTURE) ×4 IMPLANT
SUT VIC AB 2-0 CT1 TAPERPNT 27 (SUTURE) IMPLANT
SUT VIC AB 2-0 CT2 27 (SUTURE) ×6 IMPLANT
SUT VICRYL 0 UR6 27IN ABS (SUTURE) ×4 IMPLANT
TOWEL GREEN STERILE (TOWEL DISPOSABLE) ×2 IMPLANT
TRAY HUM MINI +0 40D +5 (Shoulder) IMPLANT

## 2024-09-05 NOTE — Anesthesia Procedure Notes (Signed)
 Anesthesia Regional Block: Interscalene brachial plexus block   Pre-Anesthetic Checklist: , timeout performed,  Correct Patient, Correct Site, Correct Laterality,  Correct Procedure, Correct Position, site marked,  Risks and benefits discussed,  Surgical consent,  Pre-op evaluation,  At surgeon's request and post-op pain management  Laterality: Right and Upper  Prep: chloraprep       Needles:  Injection technique: Single-shot  Needle Type: Echogenic Needle     Needle Length: 9cm  Needle Gauge: 21     Additional Needles:   Procedures:,,,, ultrasound used (permanent image in chart),,    Narrative:  Start time: 09/05/2024 5:42 PM End time: 09/05/2024 5:49 PM Injection made incrementally with aspirations every 5 mL.  Performed by: Personally  Anesthesiologist: Leonce Athens, MD  Additional Notes: Pt identified in Holding room.  Monitors applied. Working IV access confirmed. Timeout, Sterile prep R clavicle and neck.  #21ga ECHOgenic Arrow block needle to interscalene brachial plexus with US  guidance.  10cc 0.5% Bupivacaine  1:200k epi, Exparel  injected incrementally after negative test dose.  Patient asymptomatic, VSS, no heme aspirated, tolerated well.   JAYSON Leonce, MD

## 2024-09-05 NOTE — Transfer of Care (Signed)
 Immediate Anesthesia Transfer of Care Note  Patient: Christian [name]  Procedure(s) Performed: REVISION, REVERSE TOTAL ARTHROPLASTY, SHOULDER (Right: Shoulder)  Patient Location: PACU  Anesthesia Type:General  Level of Consciousness: awake, alert , and oriented  Airway & Oxygen Therapy: Patient Spontanous Breathing and Patient connected to face mask oxygen  Post-op Assessment: Report given to RN and Post -op Vital signs reviewed and stable  Post vital signs: Reviewed and stable  Last Vitals:  Vitals Value Taken Time  BP 112/72 09/05/24 21:03  Temp    Pulse 90 09/05/24 21:05  Resp    SpO2 90 % 09/05/24 21:05  Vitals shown include unfiled device data.  Last Pain:  Vitals:   09/05/24 1450  TempSrc:   PainSc: 0-No pain         Complications: No notable events documented.

## 2024-09-05 NOTE — H&P (Signed)
 Christian [name] is an 73 y.o. male.   Chief Complaint: Right shoulder pain HPI: [name] the patient underwent right reverse shoulder replacement about 8 weeks ago.  Had a dislocation about 3 weeks after his surgery.  Patient has been no rotator cuff or conjoined tendon preoperatively.  He was also dislocating his native joint preoperatively.  Nonetheless he underwent revision surgery and did well for 5 weeks until recently where he dislocated again.  Patient does weight-bear with his extremities.  Not very mobile because of other medical comorbidities.  Past Medical History:  Diagnosis Date   Arthritis of left knee    Chronic back pain 09/13/2013   Chronic pain syndrome 09/13/2013   Diabetic ulcer of right ankle (HCC)    GERD (gastroesophageal reflux disease)    hx. of   Gout 09/13/2013   Hemorrhoids    Hypertension    Morbid obesity with body mass index of 45.0-49.9 in adult [facility]) 09/13/2013   Osteoarthritis 09/13/2013   Pneumonia    hx. of   Seizures (HCC)    Sleep apnea    has never used C-pap machine due to insurance cost   Type 2 diabetes mellitus, uncontrolled     Past Surgical History:  Procedure Laterality Date   broken toe 35 years ago     COLONOSCOPY WITH PROPOFOL  N/A 12/15/2022   Procedure: COLONOSCOPY WITH PROPOFOL ;  Surgeon: Therisa Bi, MD;  Location: [facility] ENDOSCOPY;  Service: Gastroenterology;  Laterality: N/A;   IR EMBO ARTERIAL NOT HEMORR HEMANG INC GUIDE ROADMAPPING  04/27/2023   IR RADIOLOGIST EVAL & MGMT  03/29/2023   IR RADIOLOGIST EVAL & MGMT  08/02/2023   REVERSE SHOULDER ARTHROPLASTY Right 07/09/2024   Procedure: ARTHROPLASTY, SHOULDER, TOTAL, REVERSE;  Surgeon: [name], MD;  Location: [facility] OR;  Service: Orthopedics;  Laterality: Right;   REVISION TOTAL SHOULDER TO REVERSE TOTAL SHOULDER Right 07/25/2024   Procedure: REVISION, REVERSE TOTAL ARTHROPLASTY, SHOULDER;  Surgeon: [name], MD;  Location: [facility] OR;  Service: Orthopedics;  Laterality:  Right;  right shoulder open reduction, poly exchange   TOTAL KNEE ARTHROPLASTY Right 05/01/2015   Procedure: RIGHT TOTAL KNEE ARTHROPLASTY;  Surgeon: Lonni CINDERELLA Poli, MD;  Location: [facility] ORS;  Service: Orthopedics;  Laterality: Right;   WISDOM TOOTH EXTRACTION      Family History  Problem Relation Age of Onset   Alcohol abuse Father    Arthritis Maternal Grandmother    Alcohol abuse Paternal Grandmother    Alcohol abuse Paternal Grandfather    Diabetes Maternal Aunt    Social History:  reports that he quit smoking about 20 years ago. His smoking use included cigarettes. He started smoking about 62 years ago. He has a 42.2 pack-year smoking history. He has never used smokeless tobacco. He reports that he does not drink alcohol and does not use drugs.  Allergies:  Allergies  Allergen Reactions   Jardiance  [Empagliflozin ]     Yeast infections   Metoprolol  Other (See Comments)    Caused blood sugars to be elevated and not controlled   Statins     Muscle aches, joint pain   Penicillins Rash    07/25/2024 tolerated cefazolin     Medications Prior to Admission  Medication Sig Dispense Refill   aspirin  EC 81 MG tablet Take 1 tablet (81 mg total) by mouth daily. Swallow whole. (Patient taking differently: Take 81 mg by mouth in the morning. Swallow whole.) 30 tablet 12   diclofenac  (VOLTAREN ) 75 MG EC tablet Take 75  mg by mouth 2 (two) times daily.     docusate sodium  (COLACE) 100 MG capsule Take 1 capsule (100 mg total) by mouth 2 (two) times daily. (Patient taking differently: Take 100 mg by mouth daily as needed for mild constipation or moderate constipation.) 10 capsule 0   insulin  regular human CONCENTRATED (HUMULIN  R U-500 KWIKPEN) 500 UNIT/ML KwikPen INJECT 200 UNITS UNDER SKIN A DAY AS ADVISED (Patient taking differently: Inject 50 Units into the skin daily with breakfast.) 24 mL 0   metFORMIN  (GLUCOPHAGE ) 1000 MG tablet TAKE 1 TABLET BY MOUTH TWICE  DAILY WITH A MEAL (Patient  taking differently: Take 2,000 mg by mouth daily with breakfast.) 180 tablet 3   Multiple Vitamin (MULTIVITAMIN WITH MINERALS) TABS tablet Take 1 tablet by mouth daily with lunch. (Patient taking differently: Take 1 tablet by mouth every evening. One day for Men)     oxyCODONE -acetaminophen  (PERCOCET/ROXICET) 5-325 MG tablet Take 1 tablet by mouth every 6 (six) hours as needed for severe pain (pain score 7-10). 30 tablet 0   traMADol  (ULTRAM ) 50 MG tablet Take 50 mg by mouth every 8 (eight) hours as needed for moderate pain (pain score 4-6) or severe pain (pain score 7-10).     acetaminophen  (TYLENOL ) 325 MG tablet Take 1-2 tablets (325-650 mg total) by mouth every 6 (six) hours as needed for mild pain (pain score 1-3) or fever (or temp > 100.5). (Patient not taking: Reported on 09/03/2024) 60 tablet 0   BD PEN NEEDLE NANO 2ND GEN 32G X 4 MM MISC USE AS DIRECTED 5 TIMES A DAY WITH INSULIN  INJECTIONS 200 each 5   Continuous Glucose Sensor (FREESTYLE LIBRE 3 SENSOR) MISC by Does not apply route. Use to check glucose continuously change every 14 days     tiZANidine  (ZANAFLEX ) 2 MG tablet Take 1 tablet (2 mg total) by mouth every 8 (eight) hours as needed for muscle spasms. 30 tablet 0    Results for orders placed or performed during the hospital encounter of 09/05/24 (from the past 48 hours)  Basic metabolic panel     Status: Abnormal   Collection Time: 09/05/24  2:36 PM  Result Value Ref Range   Sodium 136 135 - 145 mmol/L   Potassium 3.9 3.5 - 5.1 mmol/L   Chloride 105 98 - 111 mmol/L   CO2 20 (L) 22 - 32 mmol/L   Glucose, Bld 153 (H) 70 - 99 mg/dL    Comment: Glucose reference range applies only to samples taken after fasting for at least 8 hours.   BUN 15 8 - 23 mg/dL   Creatinine, Ser 9.25 0.61 - 1.24 mg/dL   Calcium 9.0 8.9 - 89.6 mg/dL   GFR, Estimated >39 >39 mL/min    Comment: (NOTE) Calculated using the CKD-EPI Creatinine Equation (2021)    Anion gap 11 5 - 15    Comment: Performed  at [facility], [address] [place], [place] [address]  Surgical pcr screen     Status: None   Collection Time: 09/05/24  2:37 PM   Specimen: Nasal Mucosa; Nasal Swab  Result Value Ref Range   MRSA, PCR NEGATIVE NEGATIVE   Staphylococcus aureus NEGATIVE NEGATIVE    Comment: (NOTE) The Xpert SA Assay (FDA approved for NASAL specimens in patients 70 years of age and older), is one component of a comprehensive surveillance program. It is not intended to diagnose infection nor to guide or monitor treatment. Performed at [facility], [address] [place], [place] [address]   Glucose, capillary     Status: Abnormal   Collection Time: 09/05/24  2:44 PM  Result Value Ref Range   Glucose-Capillary 165 (H) 70 - 99 mg/dL    Comment: Glucose reference range applies only to samples taken after fasting for at least 8 hours.  CBC     Status: Abnormal   Collection Time: 09/05/24  3:45 PM  Result Value Ref Range   WBC 11.3 (H) 4.0 - 10.5 K/uL   RBC 4.04 (L) 4.22 - 5.81 MIL/uL   Hemoglobin 12.6 (L) 13.0 - 17.0 g/dL   HCT 60.4 60.9 - 47.9 %   MCV 97.8 80.0 - 100.0 fL   MCH 31.2 26.0 - 34.0 pg   MCHC 31.9 30.0 - 36.0 g/dL   RDW 85.8 88.4 - 84.4 %   Platelets 267 150 - 400 K/uL   nRBC 0.0 0.0 - 0.2 %    Comment: Performed at [facility], [address] [place], [place] [address]  Glucose, capillary     Status: Abnormal   Collection Time: 09/05/24  4:16 PM  Result Value Ref Range   Glucose-Capillary 139 (H) 70 - 99 mg/dL    Comment: Glucose reference range applies only to samples taken after fasting for at least 8 hours.   No results found.  Review of Systems  Musculoskeletal:  Positive for arthralgias.  All other systems reviewed and are negative.   Blood pressure 135/83, pulse 100, temperature 98 F (36.7 C), temperature source Oral, resp. rate 18, height 5' 6 (1.676 m), weight 122.5 kg, SpO2 95%. Physical Exam Vitals reviewed.  HENT:     Head:  Normocephalic.     Nose: Nose normal.     Mouth/Throat:     Mouth: Mucous membranes are moist.  Eyes:     Pupils: Pupils are equal, round, and reactive to light.  Cardiovascular:     Rate and Rhythm: Normal rate.     Pulses: Normal pulses.  Pulmonary:     Effort: Pulmonary effort is normal.  Abdominal:     General: Abdomen is flat.  Musculoskeletal:     Cervical back: Normal range of motion.  Skin:    General: Skin is warm.     Capillary Refill: Capillary refill takes less than 2 seconds.  Neurological:     General: No focal deficit present.     Mental Status: He is alert.  Psychiatric:        Mood and Affect: Mood normal.   Motor or sensory function to the hand is intact.  Mild paresthesias in the fingers have been improving.  Radial pulses intact.  Deltoid does fire.  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  Plan is stem excision with replacement with SRS which will allow us  to better restore tension and change version for optimal stability.  Risk and benefits are discussed.  All questions answered.  Deltoid still fires.  Essentially were aiming for a stable shoulder which does not get infected in which we will have better function and his preoperative status.  If this fails then we would likely have to go with component extraction.  KANDICE Glendia Hutchinson, MD 09/05/2024, 5:31 PM

## 2024-09-05 NOTE — Anesthesia Procedure Notes (Signed)
 Procedure Name: Intubation Date/Time: 09/05/2024 6:13 PM  Performed by: Lockie Flesher, CRNAPre-anesthesia Checklist: Patient identified, Emergency Drugs available, Suction available and Patient being monitored Patient Re-evaluated:Patient Re-evaluated prior to induction Oxygen Delivery Method: Circle System Utilized Preoxygenation: Pre-oxygenation with 100% oxygen Induction Type: IV induction Ventilation: Mask ventilation without difficulty Laryngoscope Size: Mac and 3 Grade View: Grade I Tube type: Oral Tube size: 7.5 mm Number of attempts: 1 Airway Equipment and Method: Stylet and Oral airway Placement Confirmation: ETT inserted through vocal cords under direct vision, positive ETCO2 and breath sounds checked- equal and bilateral Secured at: 23 cm Tube secured with: Tape Dental Injury: Teeth and Oropharynx as per pre-operative assessment

## 2024-09-05 NOTE — Brief Op Note (Signed)
   09/05/2024  8:58 PM  PATIENT:  Christian [name]  73 y.o. male  PRE-OPERATIVE DIAGNOSIS:  right shoulder dislocation rsa POST-OPERATIVE DIAGNOSIS:  right shoulder dislocation rsa  PROCEDURE:  Procedure(s): REVISION, REVERSE TOTAL ARTHROPLASTY, SHOULDER  SURGEON:  Surgeon(s): Addie, Cordella Hamilton, MD  ASSISTANT: magnant pa  ANESTHESIA:   general  EBL: 100 ml    Total I/O In: 500 [IV Piggyback:500] Out: 300 [Blood:300]  BLOOD ADMINISTERED: none  DRAINS: none   LOCAL MEDICATIONS USED:  vanco  SPECIMEN:  No Specimen  COUNTS:  YES  TOURNIQUET:  * No tourniquets in log *  DICTATION: .Other Dictation: Dictation Number [id number]  PLAN OF CARE: Admit for overnight observation  PATIENT DISPOSITION:  PACU - hemodynamically stable

## 2024-09-05 NOTE — Op Note (Signed)
 NAME: Christian [name], Christian [name] MEDICAL RECORD NO: [id number] ACCOUNT NO: [id number] DATE OF BIRTH: January 27, 1951 FACILITY: [facility] LOCATION: [facility] PHYSICIAN: Cordella RAMAN. Addie, MD  Operative Report   DATE OF PROCEDURE: 09/05/2024  PREOPERATIVE DIAGNOSIS: Right reverse shoulder arthroplasty instability.  POSTOPERATIVE DIAGNOSIS: Right reverse shoulder arthroplasty instability.  PROCEDURE: Right revision reverse shoulder arthroplasty with removal of humeral component and placement of SRS implant, which was a comprehensive modular stem porous plasma 100 mm in length with proximal large body segmental revision system with set  screws with comprehensive reverse shoulder system mini humeral tray, +5 thickness with +0 tapered offset, 40 mm in diameter with a +3 retentive thickness bearing.  SURGEON: Cordella RAMAN. Addie, MD  ASSISTANT:  Herlene Calix, PA  INDICATIONS: The patient is a 73 year old patient with recurrent shoulder instability following standard RSA with one revision to maximum offset components available for a standard stem. He had instability by his account moving his arm from neutral to 20  degrees of external rotation. He presents now for revision of the humeral stem.  DESCRIPTION OF PROCEDURE: The patient was brought to the operating room where general anesthetic was induced, preoperative antibiotics were induced. A timeout was called. Right shoulder, arm, and hand were prescrubbed with alcohol and Betadine  and then  prepped with ChloraPrep solution and draped in a sterile manner. Ioban was used to seal the operative field and cover the operative field. The timeout was called. An incision was made, which was the prior incision. The skin had healed nicely. A plane was  developed between the pectoralis and the deltoid. The deltoid was then mobilized off of the humerus. The cephalic vein was visualized and it was mobilized laterally. At this time, there was some bloody fluid encountered within the  dislocation cavity. No  evidence of infection. This area was suctioned out and irrigated. We did use Irrisept solution after the incision as well as after entering into the arthrotomy cavity. At this time, an attempt was made to reduce the shoulder and it was not reducible. At  this time, the tray was removed from the humerus. We then used an osteotome to free up space between the implant and the proximal humerus. It was then removed. In accordance with templating, about 5.5 cm of the proximal humerus was resected with the saw  with surrounding neurovascular structures protected. We then reamed up the humeral canal in 35 degrees of retroversion, which was about 10 more degrees of retroversion than the native implant. We reamed up to size 14. Trial components were placed, which  was on the 14 stem with a large proximal body and mini humeral tray with various offsets. At this time after stability was confirmed, we removed the trial implants. We irrigated out the canal and then placed in the true implant within 3-4 mm of its  trial impacted distance into the humerus. Very stable construct and about 35 degrees of retroversion.  Next, we did repeat reductions and the most stable construct with the proximal large body was the mini humeral tray +5 thickness with the +3 retentive bearing. This construct was difficult to reduce. It was stable with internal and external rotation.  That stability was stable hand to belly as well as external rotation to 80 degrees. We also took the arm up into 90 degrees of abduction, internally and externally rotated about 70 degrees with no instability. The trial component was difficult to  dislocate. It was dislocated and we put in the true component and with those components  in place required loop pulling traction and we were able to reduce the arm. The same stability parameters were maintained. Adequate deltoid tension was confirmed and  then, the joint was thoroughly irrigated  with 3 liters of pulsatile irrigating solution. Vancomycin  powder was placed after Irrisept solution irrigation as well. The construct was then closed using #1 Vicryl suture followed by interrupted inverted 0  Vicryl suture, 2-0 Vicryl suture with 3-0 Monocryl and 3-0 nylon with incisional VAC placed. The patient was placed in a shoulder sling. We will keep him in the sling for 6 weeks this time and mobilize after that. He tolerated the procedure well without  immediate complications. Luke's assistance was required for opening, closing, helped with reduction and tissue retraction. His assistance was of medical necessity.      SUJ D: 09/05/2024 9:06:10 pm T: 09/05/2024 11:25:00 pm  JOB: [id number]/ [id number]

## 2024-09-05 NOTE — Progress Notes (Signed)
 The spouse Rockford Leinen called to inquire if the pt can eat any food since the OR told them the pt's surgery will not start until 5pm. Explained to [name], that he can not have any food, but he can have clear liquids until 2pm, to finish the G2 pre-surgery that was given by 2pm, and to arrive at 2:30p.

## 2024-09-06 DIAGNOSIS — M24411 Recurrent dislocation, right shoulder: Secondary | ICD-10-CM | POA: Diagnosis not present

## 2024-09-06 DIAGNOSIS — E119 Type 2 diabetes mellitus without complications: Secondary | ICD-10-CM | POA: Diagnosis not present

## 2024-09-06 DIAGNOSIS — Z96611 Presence of right artificial shoulder joint: Secondary | ICD-10-CM | POA: Diagnosis not present

## 2024-09-06 DIAGNOSIS — Z7982 Long term (current) use of aspirin: Secondary | ICD-10-CM | POA: Diagnosis not present

## 2024-09-06 DIAGNOSIS — Z87891 Personal history of nicotine dependence: Secondary | ICD-10-CM | POA: Diagnosis not present

## 2024-09-06 DIAGNOSIS — I10 Essential (primary) hypertension: Secondary | ICD-10-CM | POA: Diagnosis not present

## 2024-09-06 LAB — GLUCOSE, CAPILLARY
Glucose-Capillary: 238 mg/dL — ABNORMAL HIGH (ref 70–99)
Glucose-Capillary: 204 mg/dL — ABNORMAL HIGH (ref 70–99)
Glucose-Capillary: 171 mg/dL — ABNORMAL HIGH (ref 70–99)

## 2024-09-06 NOTE — Anesthesia Postprocedure Evaluation (Signed)
 Anesthesia Post Note  Patient: Christian [name]  Procedure(s) Performed: REVISION, REVERSE TOTAL ARTHROPLASTY, SHOULDER (Right: Shoulder)     Patient location during evaluation: PACU Anesthesia Type: Regional and General Level of consciousness: awake and alert Pain management: pain level controlled Vital Signs Assessment: post-procedure vital signs reviewed and stable Respiratory status: spontaneous breathing, nonlabored ventilation, respiratory function stable and patient connected to nasal cannula oxygen Cardiovascular status: blood pressure returned to baseline and stable Postop Assessment: no apparent nausea or vomiting Anesthetic complications: no   No notable events documented.  Last Vitals:  Vitals:   09/05/24 2347 09/06/24 0410  BP: 130/75 90/76  Pulse: 96 99  Resp: 18 20  Temp: 36.6 C 37.1 C  SpO2: 93% (!) 88%    Last Pain:  Vitals:   09/06/24 0607  TempSrc:   PainSc: 4                  Karmin Kasprzak S

## 2024-09-06 NOTE — Progress Notes (Signed)
  Subjective: Pt stable - pain controlled   Objective: Vital signs in last 24 hours: Temp:  [97.9 F (36.6 C)-98.7 F (37.1 C)] 98.7 F (37.1 C) (10/31 0410) Pulse Rate:  [85-104] 99 (10/31 0410) Resp:  [15-20] 20 (10/31 0410) BP: (90-151)/(59-91) 90/76 (10/31 0410) SpO2:  [88 %-97 %] 88 % (10/31 0410) Weight:  [122.5 kg] 122.5 kg (10/30 1438)  Intake/Output from previous day: 10/30 0701 - 10/31 0700 In: 1650 [I.V.:1000; IV Piggyback:650] Out: 300 [Blood:300] Intake/Output this shift: Total I/O In: 501.5 [I.V.:501.5] Out: -   Exam:  Intact pulses distally No cellulitis present Compartment soft  Labs: Recent Labs    09/05/24 1545  HGB 12.6*   Recent Labs    09/05/24 1545  WBC 11.3*  RBC 4.04*  HCT 39.5  PLT 267   Recent Labs    09/05/24 1436  NA 136  K 3.9  CL 105  CO2 20*  BUN 15  CREATININE 0.74  GLUCOSE 153*  CALCIUM 9.0   No results for input(s): LABPT, INR in the last 72 hours.  Assessment/Plan: Plan dc am - shoulder located - cont iv abx   G Christian [name] 09/06/2024, 7:49 AM

## 2024-09-06 NOTE — Care Management Obs Status (Cosign Needed)
 MEDICARE OBSERVATION STATUS NOTIFICATION   Patient Details  Name: Christian [name] MRN: [id number] Date of Birth: Jul 21, 1951   Medicare Observation Status Notification Given:  Yes    Rosaline JONELLE Joe, RN 09/06/2024, 11:25 AM

## 2024-09-06 NOTE — Plan of Care (Signed)
 Problem: Education: Goal: Knowledge of General Education information will improve Description: Including pain rating scale, medication(s)/side effects and non-pharmacologic comfort measures 09/06/2024 0716 by Ricci Avelina SAUNDERS, RN Outcome: Progressing 09/06/2024 0715 by Ricci Avelina SAUNDERS, RN Outcome: Progressing 09/06/2024 0715 by Ricci Avelina SAUNDERS, RN Outcome: Progressing   Problem: Health Behavior/Discharge Planning: Goal: Ability to manage health-related needs will improve 09/06/2024 0716 by Ricci Avelina SAUNDERS, RN Outcome: Progressing 09/06/2024 0715 by Ricci Avelina SAUNDERS, RN Outcome: Progressing 09/06/2024 0715 by Ricci Avelina SAUNDERS, RN Outcome: Progressing   Problem: Clinical Measurements: Goal: Ability to maintain clinical measurements within normal limits will improve 09/06/2024 0716 by Ricci Avelina SAUNDERS, RN Outcome: Progressing 09/06/2024 0715 by Ricci Avelina SAUNDERS, RN Outcome: Progressing 09/06/2024 0715 by Ricci Avelina SAUNDERS, RN Outcome: Progressing Goal: Will remain free from infection 09/06/2024 0716 by Ricci Avelina SAUNDERS, RN Outcome: Progressing 09/06/2024 0715 by Ricci Avelina SAUNDERS, RN Outcome: Progressing 09/06/2024 0715 by Ricci Avelina SAUNDERS, RN Outcome: Progressing Goal: Diagnostic test results will improve 09/06/2024 0716 by Ricci Avelina SAUNDERS, RN Outcome: Progressing 09/06/2024 0715 by Ricci Avelina SAUNDERS, RN Outcome: Progressing 09/06/2024 0715 by Ricci Avelina SAUNDERS, RN Outcome: Progressing Goal: Respiratory complications will improve 09/06/2024 0716 by Ricci Avelina SAUNDERS, RN Outcome: Progressing 09/06/2024 0715 by Ricci Avelina SAUNDERS, RN Outcome: Progressing 09/06/2024 0715 by Ricci Avelina SAUNDERS, RN Outcome: Progressing Goal: Cardiovascular complication will be avoided 09/06/2024 0716 by Ricci Avelina SAUNDERS, RN Outcome: Progressing 09/06/2024 0715 by Ricci Avelina SAUNDERS, RN Outcome: Progressing 09/06/2024 0715 by Ricci Avelina SAUNDERS,  RN Outcome: Progressing   Problem: Activity: Goal: Risk for activity intolerance will decrease 09/06/2024 0716 by Ricci Avelina SAUNDERS, RN Outcome: Progressing 09/06/2024 0715 by Ricci Avelina SAUNDERS, RN Outcome: Progressing 09/06/2024 0715 by Ricci Avelina SAUNDERS, RN Outcome: Progressing   Problem: Nutrition: Goal: Adequate nutrition will be maintained 09/06/2024 0716 by Ricci Avelina SAUNDERS, RN Outcome: Progressing 09/06/2024 0715 by Ricci Avelina SAUNDERS, RN Outcome: Progressing 09/06/2024 0715 by Ricci Avelina SAUNDERS, RN Outcome: Progressing   Problem: Coping: Goal: Level of anxiety will decrease 09/06/2024 0716 by Ricci Avelina SAUNDERS, RN Outcome: Progressing 09/06/2024 0715 by Ricci Avelina SAUNDERS, RN Outcome: Progressing 09/06/2024 0715 by Ricci Avelina SAUNDERS, RN Outcome: Progressing   Problem: Elimination: Goal: Will not experience complications related to bowel motility 09/06/2024 0716 by Ricci Avelina SAUNDERS, RN Outcome: Progressing 09/06/2024 0715 by Ricci Avelina SAUNDERS, RN Outcome: Progressing 09/06/2024 0715 by Ricci Avelina SAUNDERS, RN Outcome: Progressing Goal: Will not experience complications related to urinary retention 09/06/2024 0716 by Ricci Avelina SAUNDERS, RN Outcome: Progressing 09/06/2024 0715 by Ricci Avelina SAUNDERS, RN Outcome: Progressing 09/06/2024 0715 by Ricci Avelina SAUNDERS, RN Outcome: Progressing   Problem: Pain Managment: Goal: General experience of comfort will improve and/or be controlled 09/06/2024 0716 by Ricci Avelina SAUNDERS, RN Outcome: Progressing 09/06/2024 0715 by Ricci Avelina SAUNDERS, RN Outcome: Progressing 09/06/2024 0715 by Ricci Avelina SAUNDERS, RN Outcome: Progressing   Problem: Safety: Goal: Ability to remain free from injury will improve 09/06/2024 0716 by Ricci Avelina SAUNDERS, RN Outcome: Progressing 09/06/2024 0715 by Ricci Avelina SAUNDERS, RN Outcome: Progressing 09/06/2024 0715 by Ricci Avelina SAUNDERS, RN Outcome: Progressing   Problem: Skin  Integrity: Goal: Risk for impaired skin integrity will decrease 09/06/2024 0716 by Ricci Avelina SAUNDERS, RN Outcome: Progressing 09/06/2024 0715 by Ricci Avelina SAUNDERS, RN Outcome: Progressing 09/06/2024 0715 by Ricci Avelina SAUNDERS, RN Outcome: Progressing   Problem: Education: Goal: Knowledge of the prescribed therapeutic regimen will improve 09/06/2024 0716 by Ricci Avelina SAUNDERS, RN Outcome: Progressing 09/06/2024 0715 by  Ricci Avelina SAUNDERS, RN Outcome: Progressing 09/06/2024 0715 by Ricci Avelina SAUNDERS, RN Outcome: Progressing Goal: Understanding of activity limitations/precautions following surgery will improve 09/06/2024 0716 by Ricci Avelina SAUNDERS, RN Outcome: Progressing 09/06/2024 0715 by Ricci Avelina SAUNDERS, RN Outcome: Progressing 09/06/2024 0715 by Ricci Avelina SAUNDERS, RN Outcome: Progressing Goal: Individualized Educational Video(s) 09/06/2024 0716 by Ricci Avelina SAUNDERS, RN Outcome: Progressing 09/06/2024 0715 by Ricci Avelina SAUNDERS, RN Outcome: Progressing 09/06/2024 0715 by Ricci Avelina SAUNDERS, RN Outcome: Progressing   Problem: Activity: Goal: Ability to tolerate increased activity will improve 09/06/2024 0716 by Ricci Avelina SAUNDERS, RN Outcome: Progressing 09/06/2024 0715 by Ricci Avelina SAUNDERS, RN Outcome: Progressing 09/06/2024 0715 by Ricci Avelina SAUNDERS, RN Outcome: Progressing   Problem: Pain Management: Goal: Pain level will decrease with appropriate interventions 09/06/2024 0716 by Ricci Avelina SAUNDERS, RN Outcome: Progressing 09/06/2024 0715 by Ricci Avelina SAUNDERS, RN Outcome: Progressing 09/06/2024 0715 by Ricci Avelina SAUNDERS, RN Outcome: Progressing

## 2024-09-06 NOTE — Care Management CC44 (Cosign Needed)
 Condition Code 44 Documentation Completed  Patient Details  Name: STOCKTON NUNLEY MRN: [id number] Date of Birth: 08-15-1951   Condition Code 44 given:  Yes Patient signature on Condition Code 44 notice:  Yes Documentation of 2 MD's agreement:  Yes Code 44 added to claim:  Yes    Rosaline JONELLE Joe, RN 09/06/2024, 11:25 AM

## 2024-09-06 NOTE — Evaluation (Signed)
 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 PMH: L knee OA, GERD, HTN, obesity, gout, seizures, DM1, hx of back surgery April 2025     Clinical Impressions Per orders, Right shoulder parameters as follows for ADL tasks: No shoulder ROM; elbow/wrist/hand ROM only. While moving within specified parameters, pt/caregiver instructed on bathing and how to donn/doff shirt, placing operative arm through sleeve first when donning and off last when doffing.Pt/caregiver educated on compensatory strategies for LB ADL and strategies to reduce risk of falls.  Pt/caregiver educated on donning/doffing sling and to wear the sling at all times with the exception of ADL/HEP (elbow distal), and to loosen the neck strap of the sling when the operative arm is in a supported position when sitting. In sitting or supine, pt instructed to have a pillow behind and under their operative arm to provide support. If assist needed with ambulation, caregiver educated on the importance of walking on pt's non-operative side.  Education regarding use of IceMan Cold Therapy completed, including the importance of using a barrier on the shoulder prior to positioning the wrap-on pad. Pt/caregiver verbalized/demonstrated understanding.  Follow up as prescribed by MD.      If plan is discharge home, recommend the following:   A lot of help with bathing/dressing/bathroom;A little help with walking and/or transfers;Assist for transportation;Assistance with cooking/housework     Functional Status Assessment   Patient has had a recent decline in their functional status and demonstrates the ability to make significant improvements in function in a reasonable and predictable amount of  time.     Equipment Recommendations   None recommended by OT     Recommendations for Other Services         Precautions/Restrictions   Precautions Precautions: Shoulder Type of Shoulder Precautions: Elbow distal HEP only.  No A/PROM to shoulder Shoulder Interventions: Off for dressing/bathing/exercises;Shoulder sling/immobilizer Precaution Booklet Issued: No Recall of Precautions/Restrictions: Intact Restrictions Weight Bearing Restrictions Per Provider Order: Yes RUE Weight Bearing Per Provider Order: Non weight bearing     Mobility Bed Mobility Overal bed mobility: Needs Assistance Bed Mobility: Supine to Sit, Sit to Supine     Supine to sit: Supervision Sit to supine: Supervision     Patient Response: Cooperative  Transfers Overall transfer level: Needs assistance   Transfers: Sit to/from Stand Sit to Stand: Supervision                  Balance Overall balance assessment: Needs assistance Sitting-balance support: Feet supported Sitting balance-Leahy Scale: Good     Standing balance support: Single extremity supported Standing balance-Leahy Scale: Fair                             ADL either performed or assessed with clinical judgement   ADL       Grooming: Supervision/safety;Set up;Standing           Upper Body Dressing : Moderate assistance;Sitting   Lower Body Dressing: Moderate assistance;Sit to/from stand   Toilet Transfer: Retail Banker;Ambulation                   Vision Baseline Vision/History: 1 Wears glasses Patient Visual Report: No change from baseline  Perception Perception: Within Functional Limits       Praxis Praxis: WFL       Pertinent Vitals/Pain Pain Assessment Pain Assessment: Faces Faces Pain Scale: No hurt Pain Location: Block in place Pain Intervention(s): Monitored during session     Extremity/Trunk Assessment Upper Extremity Assessment Upper  Extremity Assessment: Generalized weakness;Right hand dominant;RUE deficits/detail;LUE deficits/detail RUE Deficits / Details: TSA RUE Sensation: decreased light touch;decreased proprioception RUE Coordination: decreased fine motor LUE Deficits / Details: Needs TSA with limited shoulder ROM LUE Sensation: WNL LUE Coordination: WNL   Lower Extremity Assessment Lower Extremity Assessment: Overall WFL for tasks assessed   Cervical / Trunk Assessment Cervical / Trunk Assessment: Other exceptions Cervical / Trunk Exceptions: Body habitus   Communication Communication Communication: No apparent difficulties   Cognition Arousal: Alert Behavior During Therapy: WFL for tasks assessed/performed Cognition: No apparent impairments                               Following commands: Intact       Cueing  General Comments   Cueing Techniques: Verbal cues   VSS on O2   Exercises     Shoulder Instructions      Home Living Family/patient expects to be discharged to:: Private residence Living Arrangements: Spouse/significant other Available Help at Discharge: Family;Available 24 hours/day Type of Home: House Home Access: Stairs to enter [employer] of Steps: 4 Entrance Stairs-Rails: Right;Left Home Layout: One level     Bathroom Shower/Tub: Chief Strategy Officer: Standard Bathroom Accessibility: No   Home Equipment: Shower seat;Cane - single point;Rolling Walker (2 wheels);Toilet riser   Additional Comments: with tubbench      Prior Functioning/Environment Prior Level of Function : Needs assist             Mobility Comments: Ambulates using SPC. Pt reports 2-3 falls in the past 39mo. ADLs Comments: Assist as needed since TSA    OT Problem List: Impaired balance (sitting and/or standing);Decreased range of motion   OT Treatment/Interventions: Self-care/ADL training;Therapeutic activities;Balance training;Therapeutic exercise       OT Goals(Current goals can be found in the care plan section)   Acute Rehab OT Goals Patient Stated Goal: Return home OT Goal Formulation: With patient Time For Goal Achievement: 09/20/24 Potential to Achieve Goals: Good ADL Goals Pt Will Perform Upper Body Dressing: with min assist;sitting Pt Will Perform Lower Body Dressing: sit to/from stand;with min assist Pt Will Transfer to Toilet: with modified independence;ambulating;regular height toilet   OT Frequency:  Min 2X/week    Co-evaluation              AM-PAC OT 6 Clicks Daily Activity     Outcome Measure Help from another person eating meals?: A Little Help from another person taking care of personal grooming?: A Little Help from another person toileting, which includes using toliet, bedpan, or urinal?: A Lot Help from another person bathing (including washing, rinsing, drying)?: A Lot Help from another person to put on and taking off regular upper body clothing?: A Lot Help from another person to put on and taking off regular lower body clothing?: A Lot 6 Click Score: 14   End of Session Nurse Communication: Mobility status  Activity Tolerance: Patient tolerated treatment well Patient left: in bed;with call bell/phone within reach  OT Visit Diagnosis: Unsteadiness on feet (R26.81)  Time: 8866-8844 OT Time Calculation (min): 22 min Charges:  OT General Charges $OT Visit: 1 Visit OT Evaluation $OT Eval Moderate Complexity: 1 Mod  09/06/2024  RP, OTR/L  Acute Rehabilitation Services  Office:  [phone]   Charlie JONETTA Halsted 09/06/2024, 12:01 PM

## 2024-09-06 NOTE — Progress Notes (Signed)
 Transition of Care [facility]) - Inpatient Brief Assessment   Patient Details  Name: Christian [name] MRN: [id number] Date of Birth: 04/26/1951  [facility] CM/SW Contact:    Rosaline JONELLE Joe, RN Phone Number: 09/06/2024, 11:50 AM   Clinical Narrative: CM met with the patient at the bedside - Medicare Observation letter provide and Code 44 completed.  Patient is S/P Right shoulder surgery by [facility] in place at this time.  I sent a message to provider to ask if patient would be transitioned to prevena wound vac for home - pending answer at this time.  Patient has [place] and RW at home.  Patient lives with spouse and plans to return home likely tomorrow by car.  Patient declines home health services.  No other IP Care management needs at this time.   Transition of Care Asessment: Insurance and Status: Insurance coverage has been reviewed Patient has primary care physician: Yes Home environment has been reviewed: from home with spouse Prior level of function:: Independent - Hotel Manager Home Services: No current home services (DME at the home includes [place], MAINE) Social Drivers of Health Review: SDOH reviewed interventions complete Readmission risk has been reviewed: Yes Transition of care needs: transition of care needs identified, TOC will continue to follow

## 2024-09-06 NOTE — Plan of Care (Signed)
 Problem: Education: Goal: Knowledge of General Education information will improve Description: Including pain rating scale, medication(s)/side effects and non-pharmacologic comfort measures 09/06/2024 0715 by Ricci Avelina SAUNDERS, RN Outcome: Progressing 09/06/2024 0715 by Ricci Avelina SAUNDERS, RN Outcome: Progressing   Problem: Health Behavior/Discharge Planning: Goal: Ability to manage health-related needs will improve 09/06/2024 0715 by Ricci Avelina SAUNDERS, RN Outcome: Progressing 09/06/2024 0715 by Ricci Avelina SAUNDERS, RN Outcome: Progressing   Problem: Clinical Measurements: Goal: Ability to maintain clinical measurements within normal limits will improve 09/06/2024 0715 by Ricci Avelina SAUNDERS, RN Outcome: Progressing 09/06/2024 0715 by Ricci Avelina SAUNDERS, RN Outcome: Progressing Goal: Will remain free from infection 09/06/2024 0715 by Ricci Avelina SAUNDERS, RN Outcome: Progressing 09/06/2024 0715 by Ricci Avelina SAUNDERS, RN Outcome: Progressing Goal: Diagnostic test results will improve 09/06/2024 0715 by Ricci Avelina SAUNDERS, RN Outcome: Progressing 09/06/2024 0715 by Ricci Avelina SAUNDERS, RN Outcome: Progressing Goal: Respiratory complications will improve 09/06/2024 0715 by Ricci Avelina SAUNDERS, RN Outcome: Progressing 09/06/2024 0715 by Ricci Avelina SAUNDERS, RN Outcome: Progressing Goal: Cardiovascular complication will be avoided 09/06/2024 0715 by Ricci Avelina SAUNDERS, RN Outcome: Progressing 09/06/2024 0715 by Ricci Avelina SAUNDERS, RN Outcome: Progressing   Problem: Activity: Goal: Risk for activity intolerance will decrease 09/06/2024 0715 by Ricci Avelina SAUNDERS, RN Outcome: Progressing 09/06/2024 0715 by Ricci Avelina SAUNDERS, RN Outcome: Progressing   Problem: Nutrition: Goal: Adequate nutrition will be maintained 09/06/2024 0715 by Ricci Avelina SAUNDERS, RN Outcome: Progressing 09/06/2024 0715 by Ricci Avelina SAUNDERS, RN Outcome: Progressing   Problem: Coping: Goal: Level  of anxiety will decrease 09/06/2024 0715 by Ricci Avelina SAUNDERS, RN Outcome: Progressing 09/06/2024 0715 by Ricci Avelina SAUNDERS, RN Outcome: Progressing   Problem: Elimination: Goal: Will not experience complications related to bowel motility 09/06/2024 0715 by Ricci Avelina SAUNDERS, RN Outcome: Progressing 09/06/2024 0715 by Ricci Avelina SAUNDERS, RN Outcome: Progressing Goal: Will not experience complications related to urinary retention 09/06/2024 0715 by Ricci Avelina SAUNDERS, RN Outcome: Progressing 09/06/2024 0715 by Ricci Avelina SAUNDERS, RN Outcome: Progressing   Problem: Pain Managment: Goal: General experience of comfort will improve and/or be controlled 09/06/2024 0715 by Ricci Avelina SAUNDERS, RN Outcome: Progressing 09/06/2024 0715 by Ricci Avelina SAUNDERS, RN Outcome: Progressing   Problem: Safety: Goal: Ability to remain free from injury will improve 09/06/2024 0715 by Ricci Avelina SAUNDERS, RN Outcome: Progressing 09/06/2024 0715 by Ricci Avelina SAUNDERS, RN Outcome: Progressing   Problem: Skin Integrity: Goal: Risk for impaired skin integrity will decrease 09/06/2024 0715 by Ricci Avelina SAUNDERS, RN Outcome: Progressing 09/06/2024 0715 by Ricci Avelina SAUNDERS, RN Outcome: Progressing   Problem: Education: Goal: Knowledge of the prescribed therapeutic regimen will improve 09/06/2024 0715 by Ricci Avelina SAUNDERS, RN Outcome: Progressing 09/06/2024 0715 by Ricci Avelina SAUNDERS, RN Outcome: Progressing Goal: Understanding of activity limitations/precautions following surgery will improve 09/06/2024 0715 by Ricci Avelina SAUNDERS, RN Outcome: Progressing 09/06/2024 0715 by Ricci Avelina SAUNDERS, RN Outcome: Progressing Goal: Individualized Educational Video(s) 09/06/2024 0715 by Ricci Avelina SAUNDERS, RN Outcome: Progressing 09/06/2024 0715 by Ricci Avelina SAUNDERS, RN Outcome: Progressing   Problem: Activity: Goal: Ability to tolerate increased activity will improve 09/06/2024 0715 by Ricci Avelina SAUNDERS, RN Outcome: Progressing 09/06/2024 0715 by Ricci Avelina SAUNDERS, RN Outcome: Progressing   Problem: Pain Management: Goal: Pain level will decrease with appropriate interventions 09/06/2024 0715 by Ricci Avelina SAUNDERS, RN Outcome: Progressing 09/06/2024 0715 by Ricci Avelina SAUNDERS, RN Outcome: Progressing   Problem: Health Behavior/Discharge Planning: Goal: Ability to manage health-related needs will improve 09/06/2024 0715 by Ricci Avelina  R, RN Outcome: Progressing 09/06/2024 0715 by Ricci Avelina SAUNDERS, RN Outcome: Progressing   Problem: Clinical Measurements: Goal: Ability to maintain clinical measurements within normal limits will improve 09/06/2024 0715 by Ricci Avelina SAUNDERS, RN Outcome: Progressing 09/06/2024 0715 by Ricci Avelina SAUNDERS, RN Outcome: Progressing

## 2024-09-07 ENCOUNTER — Telehealth: Payer: Self-pay | Admitting: Surgical

## 2024-09-07 DIAGNOSIS — Z96611 Presence of right artificial shoulder joint: Secondary | ICD-10-CM | POA: Diagnosis not present

## 2024-09-07 DIAGNOSIS — Z87891 Personal history of nicotine dependence: Secondary | ICD-10-CM | POA: Diagnosis not present

## 2024-09-07 DIAGNOSIS — I10 Essential (primary) hypertension: Secondary | ICD-10-CM | POA: Diagnosis not present

## 2024-09-07 DIAGNOSIS — E119 Type 2 diabetes mellitus without complications: Secondary | ICD-10-CM | POA: Diagnosis not present

## 2024-09-07 DIAGNOSIS — Z7982 Long term (current) use of aspirin: Secondary | ICD-10-CM | POA: Diagnosis not present

## 2024-09-07 DIAGNOSIS — M24411 Recurrent dislocation, right shoulder: Secondary | ICD-10-CM | POA: Diagnosis not present

## 2024-09-07 MED ORDER — OXYCODONE HCL 5 MG PO TABS: 5.0000 mg | ORAL_TABLET | ORAL | 0 refills | Status: DC | PRN

## 2024-09-07 NOTE — Progress Notes (Signed)
 Patient doing well.  No complaints today.  Feels ready for discharge home.  Needs portable Prevena VAC prior to discharge.  On exam, patient has wound VAC sponge in good position with good suction.  No drainage in the wound VAC canister.  Intact EPL, FPL, finger abduction.  He has axillary nerve intact with deltoid firing.  Plan at this time is discharged home and he will call the office on Monday in order to schedule appointment for next Friday so that we can remove the wound VAC and place Aquacel dressing over top of the incision.  I let him know that I am on-call over the weekend so if he has any issues, he is free to call the office on-call number to reach me.

## 2024-09-07 NOTE — Telephone Encounter (Signed)
 Patient called on-call number.  Was recently discharged earlier today.  Has issue with wound VAC Praveena unit is beeping and saying no suction/leak is present.  He sent pictures demonstrating that the sponge is not compressed and the wound VAC is not sucking.  I recommended that he come into the office today or tomorrow and we can try troubleshooting it and see if we can either get him a new unit or new tubing.  It was working this morning when I saw him in the hospital and he said that it initially was working when they connected the portable Prevena wound VAC but it stopped working and started beeping as he was wheeled out of the hospital.  Patient states that he really does not have transportation to get to the office until Monday.  He will come in on Monday around 1 PM and we can see him and either switch out the unit or reinforce it with Ioban or switch out the tubing.

## 2024-09-09 ENCOUNTER — Ambulatory Visit: Admitting: Orthopedic Surgery

## 2024-09-09 ENCOUNTER — Encounter: Payer: Self-pay | Admitting: Radiology

## 2024-09-09 ENCOUNTER — Encounter (HOSPITAL_COMMUNITY): Payer: Self-pay | Admitting: Orthopedic Surgery

## 2024-09-09 DIAGNOSIS — Z96611 Presence of right artificial shoulder joint: Secondary | ICD-10-CM

## 2024-09-10 ENCOUNTER — Encounter: Payer: Self-pay | Admitting: Orthopedic Surgery

## 2024-09-10 ENCOUNTER — Ambulatory Visit: Admitting: Family Medicine

## 2024-09-10 NOTE — Telephone Encounter (Signed)
 Patient seen in office 11/3

## 2024-09-10 NOTE — Progress Notes (Signed)
 Post-Op Visit Note   Patient: Christian [name]           Date of Birth: 01-22-1951           MRN: [id number] Visit Date: 09/09/2024 PCP: Myrla Jon HERO, MD   Assessment & Plan:  Chief Complaint:  Chief Complaint  Patient presents with   Other    Wound check to d/c vac and apply aquacel   Visit Diagnoses:  1. Status post reverse arthroplasty of right shoulder     Plan: Kalik is about 4 days out from revision right reverse shoulder replacement.  Incisional VAC has a leak.  This is removed.  The incision actually looks good.  Aquacel dressing placed.  Deltoid fires.  Has pretty good motion with extension and flexion of the arm along with internal and external rotation.  I do want him to stay in the sling for another 3 weeks but he should come back in 10 days for suture removal.  Radiographs at that time as well.  Follow-Up Instructions: No follow-ups on file.   Orders:  No orders of the defined types were placed in this encounter.  No orders of the defined types were placed in this encounter.   Imaging: No results found.  PMFS History: Patient Active Problem List   Diagnosis Date Noted   S/P reverse total shoulder arthroplasty, right 09/05/2024   Instability of prosthetic shoulder joint 07/28/2024   S/P shoulder surgery 07/25/2024   Shoulder arthritis 07/09/2024   Microalbuminuria due to type 2 diabetes mellitus (HCC) 06/14/2024   Skin tag 05/03/2024   Leucocytosis 03/19/2024   Neurogenic claudication due to lumbar spinal stenosis 03/07/2024   Preop examination 02/26/2024   Tachycardia 02/26/2024   Venous stasis ulcer, left lower extremity with lymphedema 02/07/2023   Insulin  dependent type 2 diabetes mellitus (HCC) 01/02/2023   Impingement syndrome, shoulder, right 10/15/2021   Olecranon bursitis, left elbow 09/08/2021   Annual physical exam 06/01/2021   Polyneuropathy associated with underlying disease 05/08/2019   Chronic left shoulder pain 01/02/2019    Hyperlipidemia associated with type 2 diabetes mellitus (HCC) 09/03/2017   Uncontrolled type 2 diabetes mellitus with peripheral neuropathy 01/27/2016   Status post total right knee replacement 05/01/2015   Gout 09/13/2013   Chronic pain syndrome 09/13/2013   Primary osteoarthritis of left knee 09/13/2013   Morbid obesity (HCC) 09/13/2013   Seizures (HCC)    Hypertension    Past Medical History:  Diagnosis Date   Arthritis of left knee    Chronic back pain 09/13/2013   Chronic pain syndrome 09/13/2013   Diabetic ulcer of right ankle (HCC)    GERD (gastroesophageal reflux disease)    hx. of   Gout 09/13/2013   Hemorrhoids    Hypertension    Morbid obesity with body mass index of 45.0-49.9 in adult [facility]) 09/13/2013   Osteoarthritis 09/13/2013   Pneumonia    hx. of   Seizures (HCC)    Sleep apnea    has never used C-pap machine due to insurance cost   Type 2 diabetes mellitus, uncontrolled     Family History  Problem Relation Age of Onset   Alcohol abuse Father    Arthritis Maternal Grandmother    Alcohol abuse Paternal Grandmother    Alcohol abuse Paternal Grandfather    Diabetes Maternal Aunt     Past Surgical History:  Procedure Laterality Date   broken toe 35 years ago     COLONOSCOPY WITH PROPOFOL  N/A 12/15/2022  Procedure: COLONOSCOPY WITH PROPOFOL ;  Surgeon: Therisa Bi, MD;  Location: [facility] ENDOSCOPY;  Service: Gastroenterology;  Laterality: N/A;   IR EMBO ARTERIAL NOT HEMORR HEMANG INC GUIDE ROADMAPPING  04/27/2023   IR RADIOLOGIST EVAL & MGMT  03/29/2023   IR RADIOLOGIST EVAL & MGMT  08/02/2023   REVERSE SHOULDER ARTHROPLASTY Right 07/09/2024   Procedure: ARTHROPLASTY, SHOULDER, TOTAL, REVERSE;  Surgeon: [name], MD;  Location: [facility] OR;  Service: Orthopedics;  Laterality: Right;   REVISION TOTAL SHOULDER TO REVERSE TOTAL SHOULDER Right 07/25/2024   Procedure: REVISION, REVERSE TOTAL ARTHROPLASTY, SHOULDER;  Surgeon: [name], MD;  Location: [facility]  OR;  Service: Orthopedics;  Laterality: Right;  right shoulder open reduction, poly exchange   REVISION TOTAL SHOULDER TO REVERSE TOTAL SHOULDER Right 09/05/2024   Procedure: REVISION, REVERSE TOTAL ARTHROPLASTY, SHOULDER;  Surgeon: [name], MD;  Location: [facility] OR;  Service: Orthopedics;  Laterality: Right;  RIGHT SHOULDER HUMERAL REVISION   TOTAL KNEE ARTHROPLASTY Right 05/01/2015   Procedure: RIGHT TOTAL KNEE ARTHROPLASTY;  Surgeon: Lonni CINDERELLA Poli, MD;  Location: [facility] ORS;  Service: Orthopedics;  Laterality: Right;   WISDOM TOOTH EXTRACTION     Social History   Occupational History   Occupation: Retired  Tobacco Use   Smoking status: Former    Current packs/day: 0.00    Average packs/day: 1 pack/day for 42.2 years (42.2 ttl pk-yrs)    Types: Cigarettes    Start date: 12/12/1961    Quit date: 03/08/2004    Years since quitting: 20.5   Smokeless tobacco: Never  Vaping Use   Vaping status: Never Used  Substance and Sexual Activity   Alcohol use: No    Comment: rare   Drug use: No   Sexual activity: Yes    Partners: Female

## 2024-09-20 ENCOUNTER — Encounter: Payer: Self-pay | Admitting: Surgical

## 2024-09-20 ENCOUNTER — Ambulatory Visit: Admitting: Surgical

## 2024-09-20 ENCOUNTER — Other Ambulatory Visit (INDEPENDENT_AMBULATORY_CARE_PROVIDER_SITE_OTHER): Payer: Self-pay

## 2024-09-20 DIAGNOSIS — Z96611 Presence of right artificial shoulder joint: Secondary | ICD-10-CM

## 2024-09-20 MED ORDER — OXYCODONE HCL 5 MG PO TABS: 5.0000 mg | ORAL_TABLET | ORAL | 0 refills | Status: DC | PRN

## 2024-09-20 NOTE — Progress Notes (Signed)
 Post-Op Visit Note   Patient: Christian [name]           Date of Birth: November 30, 1950           MRN: [id number] Visit Date: 09/20/2024 PCP: Myrla Jon HERO, MD   Assessment & Plan:  Chief Complaint:  Chief Complaint  Patient presents with   Right Shoulder - Routine Post Op, Follow-up    09/05/24 Right RSA Humeral Revision   Visit Diagnoses:  1. Status post reverse arthroplasty of right shoulder     Plan: Patient is a 73 year old male who presents s/p right reverse shoulder arthroplasty revision of humeral component with stem revision system by [employer] on 09/05/2024.  Patient is having about the same pain level as he was throughout his recovery in the last 2 weeks.  No fall or injury that he can recall.  No fevers or chills.  No complaint aside from shoulder discomfort and a little bit of numbness and tingling in the thumb, index, long fingers that he has noticed throughout recovery ever since the first reverse shoulder procedure.  Was recently seen by Dr. Addie with removal of Prevena VAC and evaluation of incision demonstrating no significant abnormality.  Had good range of motion at that time.  On exam today, patient has visual deformity to the shoulder consistent with shoulder dislocation.  Incision is healing well with sutures intact.  Sutures removed and replaced with Steri-Strips today.  2+ radial pulse of the operative extremity.  He has intact EPL, FPL, finger abduction, pronation/supination, bicep, tricep.  Deltoid is firing with axillary nerve intact.  Intact sensation over the lateral deltoid comparable to contralateral side.  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  Unfortunately this is the third procedure that Christian [name] has had with all 3 procedures resulting in instability likely related to his lack of any intact rotator cuff tendons and any other significant soft tissue stabilizers outside of the deltoid  itself.  He was spontaneously dislocating prior to his initial reverse shoulder arthroplasty.  The most recent construct between the [facility] humeral stem and the glenosphere required 2 people pulling traction on the arm in order to reduce it at the time of surgery.  At this point, main options for Christian [name] would be either leaving this shoulder as is which would entail removing the humeral stem and leaving him with essentially a flail shoulder versus pursuing a second opinion at an academic center with a surgeon like Dr. Matilde at [facility] and seeing if there are any other options at achieving stability for this right shoulder.  After lengthy discussion, we will plan for referral to [facility].  Axillary nerve is still functioning based on exam today.  If he cannot be seen for several weeks or months, may need to consider removal of the stem in the interim.  His main concern is that he ultimately wants to regain the ability to drive a vehicle without limitations.  Follow-Up Instructions: No follow-ups on file.   Orders:  Orders Placed This Encounter  Procedures   XR Shoulder Right   Ambulatory referral to Orthopedic Surgery   Meds ordered this encounter  Medications   oxyCODONE  (ROXICODONE ) 5 MG immediate release tablet    Sig: Take 1 tablet (5 mg total) by mouth every 4 (four) hours as needed for severe pain (pain score 7-10).    Dispense:  30 tablet    Refill:  0    Imaging: No results found.  PMFS History: Patient Active Problem List   Diagnosis Date Noted   S/P reverse total shoulder arthroplasty, right 09/05/2024   Instability of prosthetic shoulder joint 07/28/2024   S/P shoulder surgery 07/25/2024   Shoulder arthritis 07/09/2024   Microalbuminuria due to type 2 diabetes mellitus (HCC) 06/14/2024   Skin tag 05/03/2024   Leucocytosis 03/19/2024   Neurogenic claudication due to lumbar spinal stenosis 03/07/2024   Preop examination 02/26/2024   Tachycardia 02/26/2024   Venous  stasis ulcer, left lower extremity with lymphedema 02/07/2023   Insulin  dependent type 2 diabetes mellitus (HCC) 01/02/2023   Impingement syndrome, shoulder, right 10/15/2021   Olecranon bursitis, left elbow 09/08/2021   Annual physical exam 06/01/2021   Polyneuropathy associated with underlying disease 05/08/2019   Chronic left shoulder pain 01/02/2019   Hyperlipidemia associated with type 2 diabetes mellitus (HCC) 09/03/2017   Uncontrolled type 2 diabetes mellitus with peripheral neuropathy 01/27/2016   Status post total right knee replacement 05/01/2015   Gout 09/13/2013   Chronic pain syndrome 09/13/2013   Primary osteoarthritis of left knee 09/13/2013   Morbid obesity (HCC) 09/13/2013   Seizures (HCC)    Hypertension    Past Medical History:  Diagnosis Date   Arthritis of left knee    Chronic back pain 09/13/2013   Chronic pain syndrome 09/13/2013   Diabetic ulcer of right ankle (HCC)    GERD (gastroesophageal reflux disease)    hx. of   Gout 09/13/2013   Hemorrhoids    Hypertension    Morbid obesity with body mass index of 45.0-49.9 in adult [facility]) 09/13/2013   Osteoarthritis 09/13/2013   Pneumonia    hx. of   Seizures (HCC)    Sleep apnea    has never used C-pap machine due to insurance cost   Type 2 diabetes mellitus, uncontrolled     Family History  Problem Relation Age of Onset   Alcohol abuse Father    Arthritis Maternal Grandmother    Alcohol abuse Paternal Grandmother    Alcohol abuse Paternal Grandfather    Diabetes Maternal Aunt     Past Surgical History:  Procedure Laterality Date   broken toe 35 years ago     COLONOSCOPY WITH PROPOFOL  N/A 12/15/2022   Procedure: COLONOSCOPY WITH PROPOFOL ;  Surgeon: Therisa Bi, MD;  Location: [facility] ENDOSCOPY;  Service: Gastroenterology;  Laterality: N/A;   IR EMBO ARTERIAL NOT HEMORR HEMANG INC GUIDE ROADMAPPING  04/27/2023   IR RADIOLOGIST EVAL & MGMT  03/29/2023   IR RADIOLOGIST EVAL & MGMT  08/02/2023   REVERSE  SHOULDER ARTHROPLASTY Right 07/09/2024   Procedure: ARTHROPLASTY, SHOULDER, TOTAL, REVERSE;  Surgeon: [name], MD;  Location: [facility] OR;  Service: Orthopedics;  Laterality: Right;   REVISION TOTAL SHOULDER TO REVERSE TOTAL SHOULDER Right 07/25/2024   Procedure: REVISION, REVERSE TOTAL ARTHROPLASTY, SHOULDER;  Surgeon: [name], MD;  Location: [facility] OR;  Service: Orthopedics;  Laterality: Right;  right shoulder open reduction, poly exchange   REVISION TOTAL SHOULDER TO REVERSE TOTAL SHOULDER Right 09/05/2024   Procedure: REVISION, REVERSE TOTAL ARTHROPLASTY, SHOULDER;  Surgeon: [name], MD;  Location: [facility] OR;  Service: Orthopedics;  Laterality: Right;  RIGHT SHOULDER HUMERAL REVISION   TOTAL KNEE ARTHROPLASTY Right 05/01/2015   Procedure: RIGHT TOTAL KNEE ARTHROPLASTY;  Surgeon: Lonni CINDERELLA Poli, MD;  Location: [facility] ORS;  Service: Orthopedics;  Laterality: Right;   WISDOM TOOTH EXTRACTION     Social History   Occupational History   Occupation: Retired  Tobacco Use  Smoking status: Former    Current packs/day: 0.00    Average packs/day: 1 pack/day for 42.2 years (42.2 ttl pk-yrs)    Types: Cigarettes    Start date: 12/12/1961    Quit date: 03/08/2004    Years since quitting: 20.5   Smokeless tobacco: Never  Vaping Use   Vaping status: Never Used  Substance and Sexual Activity   Alcohol use: No    Comment: rare   Drug use: No   Sexual activity: Yes    Partners: Female

## 2024-09-23 ENCOUNTER — Telehealth: Payer: Self-pay

## 2024-09-23 NOTE — Telephone Encounter (Signed)
 Christian [name] with [facility] called concerning referral that was sent for patient.  Stated that patient is in his global period and may need to be seen in the ED or by the surgeon who performed the surgery.  Would like a CB ASAP.  CB# [phone].  Please advise.  Thank you.

## 2024-09-23 NOTE — Telephone Encounter (Signed)
 I left voicemail for [facility] who is nurse navigator.  Patient had reverse shoulder arthroplasty, then dislocated, had revision, then dislocated, and has had another revision and is now dislocated. The only option Dr. Addie has for patient is to 1. See Dr. Matilde at [facility] to see if there is anything he can offer patient, or 2. Take out the implant and patient have to live that way. Patient requests appt with Dr. Matilde to see if he has anything that he can offer. Left triage line number if Matt needs to reach me, and told him to ask triage assistant to find me.

## 2024-09-23 NOTE — Telephone Encounter (Signed)
 FYI    I spoke with Adina, who is the nurse navigator. He states that they are unable to just make patient and appointment as he is still in the global period with Dr. Addie. I explained that Dr. Addie was referring to Dr. Matilde to see if he had anything to offer patient as the only option Dr. Addie has is to remove the implant at this point. Adina has forwarded all of the information to Dr. Matilde and will wait for his response in regards to whether or not he agrees to take patient since he is in the global period. He will call me back if I need to do anything further.

## 2024-09-26 ENCOUNTER — Other Ambulatory Visit: Payer: Self-pay | Admitting: Physician Assistant

## 2024-09-26 ENCOUNTER — Encounter: Payer: Self-pay | Admitting: Orthopedic Surgery

## 2024-09-26 NOTE — Telephone Encounter (Signed)
 Copied from CRM [id number]. Topic: Clinical - Medication Refill >> Sep 26, 2024 12:13 PM Delon DASEN wrote: Dr T patient- unable to get filled at pharmacy- need asap Medication: diclofenac  (VOLTAREN ) 75 MG EC tablet  Has the patient contacted their pharmacy? Yes (Agent: If no, request that the patient contact the pharmacy for the refill. If patient does not wish to contact the pharmacy document the reason why and proceed with request.) (Agent: If yes, when and what did the pharmacy advise?)  This is the patient's preferred pharmacy:  [id number] [place], [place] - [address] [place] [address] Phone: [phone] Fax: [phone]  Is this the correct pharmacy for this prescription? Yes If no, delete pharmacy and type the correct one.   Has the prescription been filled recently? Yes  Is the patient out of the medication? Yes  Has the patient been seen for an appointment in the last year OR does the patient have an upcoming appointment? Yes  Can we respond through MyChart? Yes  Agent: Please be advised that Rx refills may take up to 3 business days. We ask that you follow-up with your pharmacy.

## 2024-09-27 NOTE — Telephone Encounter (Signed)
 Can we get this scanned sooner than 26 Thanks

## 2024-09-30 ENCOUNTER — Other Ambulatory Visit: Payer: Self-pay | Admitting: Physician Assistant

## 2024-09-30 NOTE — Telephone Encounter (Signed)
 Sorry, just saw this today(mon) and saw he is already scheduled for wednes. I feel this is the better date than most.

## 2024-10-01 ENCOUNTER — Ambulatory Visit: Payer: Self-pay | Admitting: Physician Assistant

## 2024-10-01 ENCOUNTER — Encounter: Payer: Self-pay | Admitting: Orthopedic Surgery

## 2024-10-01 NOTE — Addendum Note (Signed)
 Addended by: Lakevia Perris P on: 10/01/2024 03:27 PM   Modules accepted: Orders

## 2024-10-01 NOTE — Telephone Encounter (Signed)
 FYI Only or Action Required?: Action required by provider: medication refill request.  Patient was last seen in primary care on 06/14/2024 by Curtis Debby PARAS, MD.  Called Nurse Triage reporting Medication Refill.  Triage Disposition: Call PCP When Office is Open  Patient/caregiver understands and will follow disposition?: Yes     Copied from CRM #[id number]. Topic: Clinical - Red Word Triage >> Oct 01, 2024 11:18 AM Amy B wrote: Red Word that prompted transfer to Nurse Triage:  Cannot walk, arthritic pain      Reason for Disposition  [1] Prescription refill request for NON-ESSENTIAL medicine (i.e., no harm to patient if med not taken) AND [2] triager unable to refill per department policy  Answer Assessment - Initial Assessment Questions Patients wife called stating they have been trying to get his Voltaren  refilled for a week. She states his pain has been getting worse and that he is having difficulty walking due to the pain. They would like a call back with a response to the refill request when able. Patient was a being seen by Dr.T hiawatha. Please advise.      1. DRUG NAME: What medicine do you need to have refilled?     diclofenac  (VOLTAREN ) 75 MG EC tablet [[id number]]  2. REFILLS REMAINING: How many refills are remaining? Notes: The label on the medicine or pill bottle will show how many refills are remaining. If there are no refills remaining, then a renewal may be needed.     0 5. PHARMACY: Have you contacted your pharmacy (drugstore)? Note: Some pharmacies will contact the doctor (or NP/PA).      Yes 6. SYMPTOMS: Do you have any symptoms?     Diffuse body pain related to arthritis  Protocols used: Medication Refill and Renewal Call-A-AH

## 2024-10-01 NOTE — Telephone Encounter (Signed)
 Forwarding message to Christian Gave, NP covering Dr. Curtis Sermon with patient wife Christian [name] that patient has never been seen with who is listed as his primary care They were scheduled for a NP visit but patient had to have surgery and the appt was not able to be kept  She states she was told that no one from our office was accepting new patients at the time  She states this is urgent need for the medication  States the diclofenac  helps with  his arthritis pain and he can hardly move without it.  Requesting rx rf of medication until can be seen with new provider  I asked if patient had requested from surgeon - states that their website states they will not fill outside  medications.   Last showing filled by historical provider  Last OV 08/082025 by Dr Curtis

## 2024-10-02 ENCOUNTER — Other Ambulatory Visit: Payer: Self-pay | Admitting: Radiology

## 2024-10-02 DIAGNOSIS — M24411 Recurrent dislocation, right shoulder: Secondary | ICD-10-CM | POA: Diagnosis not present

## 2024-10-02 MED ORDER — DICLOFENAC SODIUM 75 MG PO TBEC: 75.0000 mg | DELAYED_RELEASE_TABLET | Freq: Two times a day (BID) | ORAL | 0 refills | Status: DC

## 2024-10-02 NOTE — Telephone Encounter (Signed)
 Please notify pt I have called in a temporary 30 day supply. This NSAID is NOT recommended long term due to its potential for cardiovascular issues (heart attack stroke). I will call in 30 days so that pt can discuss this with his new PCP upon follow up and maybe they can switch him to soemthing else at that time.

## 2024-10-02 NOTE — Telephone Encounter (Signed)
 Patient wife Hadassah informed that the prescription was sent in for the patient as 30 day temporary supply and that he should discuss continued use with new PCP due to  cardiovasular risk with long term use.

## 2024-10-02 NOTE — Telephone Encounter (Signed)
 Okay to refill?

## 2024-10-07 ENCOUNTER — Other Ambulatory Visit: Payer: Self-pay | Admitting: Surgical

## 2024-10-07 MED ORDER — OXYCODONE HCL 5 MG PO TABS: 5.0000 mg | ORAL_TABLET | Freq: Four times a day (QID) | ORAL | 0 refills | Status: DC | PRN

## 2024-10-09 ENCOUNTER — Ambulatory Visit: Admitting: Orthopedic Surgery

## 2024-10-09 DIAGNOSIS — Z96611 Presence of right artificial shoulder joint: Secondary | ICD-10-CM

## 2024-10-09 NOTE — Discharge Summary (Signed)
 Physician Discharge Summary      Patient ID: Christian [name] MRN: [id number] DOB/AGE: [birth year] 73 y.o.  Admit date: 09/05/2024 Discharge date: 09/07/2024  Admission Diagnoses:  Principal Problem:   S/P reverse total shoulder arthroplasty, right   Discharge Diagnoses:  Same  Surgeries: Procedure(s): REVISION, REVERSE TOTAL ARTHROPLASTY, SHOULDER on 09/05/2024   Consultants:   Discharged Condition: Stable  Hospital Course: Christian [name] is an 73 y.o. male who was admitted 09/05/2024 with a chief complaint of right shoulder pain, and found to have a diagnosis of right shoulder dislocation.  They were brought to the operating room on 09/05/2024 and underwent the above named procedures.  Pt awoke from anesthesia without complication and was transferred to the floor. On POD1, patient's pain was controlled.  He had several doses of IV antibiotics and was ready for discharge home by POD 2.  No red flag signs or symptoms throughout his stay.  He was discharged home with Prevena VAC and pt will f/u with Dr. Addie in clinic in ~2 weeks.   Antibiotics given:  Anti-infectives (From admission, onward)    Start     Dose/Rate Route Frequency Ordered Stop   09/05/24 2315  ceFAZolin  (ANCEF ) IVPB 2g/100 mL premix  Status:  Discontinued        2 g 200 mL/hr over 30 Minutes Intravenous Every 8 hours 09/05/24 2215 09/07/24 1549   09/05/24 1850  vancomycin  (VANCOCIN ) powder  Status:  Discontinued          As needed 09/05/24 1850 09/05/24 2100   09/05/24 1445  ceFAZolin  (ANCEF ) IVPB 3g/150 mL premix        3 g 300 mL/hr over 30 Minutes Intravenous On call to O.R. 09/05/24 1436 09/05/24 1811   09/05/24 1438  ceFAZolin  (ANCEF ) 3-4 GM/150ML-% IVPB       Note to Pharmacy: LORILEE POCK: cabinet override      09/05/24 1438 09/05/24 1829     .  Recent vital signs:  Vitals:   09/07/24 0504 09/07/24 0737  BP: (!) 144/76 130/81  Pulse: 86 (!) 103  Resp: 18 16  Temp: (!) 97.5 F (36.4 C)    SpO2: 99% 96%    Recent laboratory studies:  Results for orders placed or performed during the hospital encounter of 09/05/24  Basic metabolic panel   Collection Time: 09/05/24  2:36 PM  Result Value Ref Range   Sodium 136 135 - 145 mmol/L   Potassium 3.9 3.5 - 5.1 mmol/L   Chloride 105 98 - 111 mmol/L   CO2 20 (L) 22 - 32 mmol/L   Glucose, Bld 153 (H) 70 - 99 mg/dL   BUN 15 8 - 23 mg/dL   Creatinine, Ser 9.25 0.61 - 1.24 mg/dL   Calcium 9.0 8.9 - 89.6 mg/dL   GFR, Estimated >39 >39 mL/min   Anion gap 11 5 - 15  Surgical pcr screen   Collection Time: 09/05/24  2:37 PM   Specimen: Nasal Mucosa; Nasal Swab  Result Value Ref Range   MRSA, PCR NEGATIVE NEGATIVE   Staphylococcus aureus NEGATIVE NEGATIVE  Glucose, capillary   Collection Time: 09/05/24  2:44 PM  Result Value Ref Range   Glucose-Capillary 165 (H) 70 - 99 mg/dL  CBC   Collection Time: 09/05/24  3:45 PM  Result Value Ref Range   WBC 11.3 (H) 4.0 - 10.5 K/uL   RBC 4.04 (L) 4.22 - 5.81 MIL/uL   Hemoglobin 12.6 (L) 13.0 - 17.0 g/dL   HCT  39.5 39.0 - 52.0 %   MCV 97.8 80.0 - 100.0 fL   MCH 31.2 26.0 - 34.0 pg   MCHC 31.9 30.0 - 36.0 g/dL   RDW 85.8 88.4 - 84.4 %   Platelets 267 150 - 400 K/uL   nRBC 0.0 0.0 - 0.2 %  Glucose, capillary   Collection Time: 09/05/24  4:16 PM  Result Value Ref Range   Glucose-Capillary 139 (H) 70 - 99 mg/dL  Glucose, capillary   Collection Time: 09/05/24  9:07 PM  Result Value Ref Range   Glucose-Capillary 162 (H) 70 - 99 mg/dL  Glucose, capillary   Collection Time: 09/05/24 10:10 PM  Result Value Ref Range   Glucose-Capillary 162 (H) 70 - 99 mg/dL  Glucose, capillary   Collection Time: 09/06/24  9:52 AM  Result Value Ref Range   Glucose-Capillary 238 (H) 70 - 99 mg/dL  Glucose, capillary   Collection Time: 09/06/24 12:25 PM  Result Value Ref Range   Glucose-Capillary 204 (H) 70 - 99 mg/dL  Glucose, capillary   Collection Time: 09/06/24  4:45 PM  Result Value Ref Range    Glucose-Capillary 171 (H) 70 - 99 mg/dL    Discharge Medications:   Allergies as of 09/07/2024       Reactions   Jardiance  [empagliflozin ]    Yeast infections   Metoprolol  Other (See Comments)   Caused blood sugars to be elevated and not controlled   Statins    Muscle aches, joint pain   Penicillins Rash   07/25/2024 tolerated cefazolin         Medication List     STOP taking these medications    oxyCODONE -acetaminophen  5-325 MG tablet Commonly known as: PERCOCET/ROXICET   traMADol  50 MG tablet Commonly known as: ULTRAM        TAKE these medications    acetaminophen  325 MG tablet Commonly known as: TYLENOL  Take 1-2 tablets (325-650 mg total) by mouth every 6 (six) hours as needed for mild pain (pain score 1-3) or fever (or temp > 100.5).   aspirin  EC 81 MG tablet Take 1 tablet (81 mg total) by mouth daily. Swallow whole. What changed: when to take this   BD Pen Needle Nano 2nd Gen 32G X 4 MM Misc Generic drug: Insulin  Pen Needle USE AS DIRECTED 5 TIMES A DAY WITH INSULIN  INJECTIONS   docusate sodium  100 MG capsule Commonly known as: COLACE Take 1 capsule (100 mg total) by mouth 2 (two) times daily. What changed:  when to take this reasons to take this   FreeStyle Libre 3 Sensor Misc by Does not apply route. Use to check glucose continuously change every 14 days   HumuLIN  R U-500 KwikPen 500 UNIT/ML KwikPen Generic drug: insulin  regular human CONCENTRATED INJECT 200 UNITS UNDER SKIN A DAY AS ADVISED What changed: See the new instructions.   metFORMIN  1000 MG tablet Commonly known as: GLUCOPHAGE  TAKE 1 TABLET BY MOUTH TWICE  DAILY WITH A MEAL What changed:  how much to take when to take this   multivitamin with minerals Tabs tablet Take 1 tablet by mouth daily with lunch. What changed:  when to take this additional instructions   tiZANidine  2 MG tablet Commonly known as: ZANAFLEX  Take 1 tablet (2 mg total) by mouth every 8 (eight) hours as  needed for muscle spasms.        Diagnostic Studies: XR Shoulder Right Result Date: 09/20/2024 AP and scapular Y view of right shoulder reviewed.  Anterior dislocation of the humeral  stem relative to the glenoid is noted.  No fracture.  No dissociation of the glenosphere.   Disposition: Discharge disposition: 01-Home or Self Care       Discharge Instructions     Call MD / Call 911   Complete by: As directed    If you experience chest pain or shortness of breath, CALL 911 and be transported to the hospital emergency room.  If you develope a fever above 101 F, pus (white drainage) or increased drainage or redness at the wound, or calf pain, call your surgeon's office.   Constipation Prevention   Complete by: As directed    Drink plenty of fluids.  Prune juice may be helpful.  You may use a stool softener, such as Colace (over the counter) 100 mg twice a day.  Use MiraLax  (over the counter) for constipation as needed.   Diet - low sodium heart healthy   Complete by: As directed    Discharge instructions   Complete by: As directed    You may shower, dressing is waterproof.  Do not bathe or soak the operative shoulder in a tub, pool.   No lifting or range of motion of the operative shoulder. Continue use of the sling at all times.  Follow-up with Dr. Addie or Herlene RIGGERS in 7 days next Friday.  Call the office on Monday to schedule appointment. We will remove your adhesive bandage at that time.    Dental Antibiotics:  In most cases prophylactic antibiotics for Dental procdeures after total joint surgery are not necessary.  Exceptions are as follows:  1. History of prior total joint infection  2. Severely immunocompromised (Organ Transplant, cancer chemotherapy, Rheumatoid biologic meds such as Humera)  3. Poorly controlled diabetes (A1C &gt; 8.0, blood glucose over 200)  If you have one of these conditions, contact your surgeon for an antibiotic prescription, prior to  your dental procedure.   Increase activity slowly as tolerated   Complete by: As directed    Post-operative opioid taper instructions:   Complete by: As directed    POST-OPERATIVE OPIOID TAPER INSTRUCTIONS: It is important to wean off of your opioid medication as soon as possible. If you do not need pain medication after your surgery it is ok to stop day one. Opioids include: Codeine, Hydrocodone (Norco, Vicodin), Oxycodone (Percocet, oxycontin ) and hydromorphone  amongst others.  Long term and even short term use of opiods can cause: Increased pain response Dependence Constipation Depression Respiratory depression And more.  Withdrawal symptoms can include Flu like symptoms Nausea, vomiting And more Techniques to manage these symptoms Hydrate well Eat regular healthy meals Stay active Use relaxation techniques(deep breathing, meditating, yoga) Do Not substitute Alcohol to help with tapering If you have been on opioids for less than two weeks and do not have pain than it is ok to stop all together.  Plan to wean off of opioids This plan should start within one week post op of your joint replacement. Maintain the same interval or time between taking each dose and first decrease the dose.  Cut the total daily intake of opioids by one tablet each day Next start to increase the time between doses. The last dose that should be eliminated is the evening dose.             SignedBETHA Herlene Calix 10/09/2024, 2:55 PM

## 2024-10-10 ENCOUNTER — Encounter: Payer: Self-pay | Admitting: Orthopedic Surgery

## 2024-10-10 NOTE — Progress Notes (Signed)
 Post-Op Visit Note   Patient: Christian [name]           Date of Birth: 04-08-1951           MRN: [id number] Visit Date: 10/09/2024 PCP: Christian Jon HERO, MD   Assessment & Plan:  Chief Complaint:  Chief Complaint  Patient presents with   Other    Recheck right shoulder   Visit Diagnoses:  1. Status post reverse arthroplasty of right shoulder     Plan: G expedient implant removal if that is what Christian [name] wants.  The other remote possibility is that the stem itself is rotationally unstable in the diaphysis of the humerus eorge is now about 3 weeks out from revision right shoulder reverse shoulder replacement instability.  The shoulder has dislocated again but he is somewhat functional with it.  He is having more pain in the shoulder but his hand pain which is present prior to his surgery has improved.  On exam the deltoid does fire.  Motor or sensory function of the hand is intact along with radial pulse.  Plan at this time is for him to see another provider for an evaluation on December 16.  I do not think that the shoulder is able to be stabilized with a reverse prosthesis.  His best option would be for stem removal and no further stem placement.  We did have the shoulder stable at the time of surgery but based on his hand improvement since the dislocation it is possible that it may have been lengthened and lateralized to the point where nerves are being stretched.  His preference would be for better hand function and a distracted and unstable shoulder joint.  Will see what the other provider says about his recommendations.  We would plan for expedient removal of the stem if Christian [name] is agreeable to that.  Follow-Up Instructions: No follow-ups on file.   Orders:  No orders of the defined types were placed in this encounter.  No orders of the defined types were placed in this encounter.   Imaging: No results found.  PMFS History: Patient Active Problem List   Diagnosis Date  Noted   S/P reverse total shoulder arthroplasty, right 09/05/2024   Instability of prosthetic shoulder joint 07/28/2024   S/P shoulder surgery 07/25/2024   Shoulder arthritis 07/09/2024   Microalbuminuria due to type 2 diabetes mellitus (HCC) 06/14/2024   Skin tag 05/03/2024   Leucocytosis 03/19/2024   Neurogenic claudication due to lumbar spinal stenosis 03/07/2024   Preop examination 02/26/2024   Tachycardia 02/26/2024   Venous stasis ulcer, left lower extremity with lymphedema 02/07/2023   Insulin  dependent type 2 diabetes mellitus (HCC) 01/02/2023   Impingement syndrome, shoulder, right 10/15/2021   Olecranon bursitis, left elbow 09/08/2021   Annual physical exam 06/01/2021   Polyneuropathy associated with underlying disease 05/08/2019   Chronic left shoulder pain 01/02/2019   Hyperlipidemia associated with type 2 diabetes mellitus (HCC) 09/03/2017   Uncontrolled type 2 diabetes mellitus with peripheral neuropathy 01/27/2016   Status post total right knee replacement 05/01/2015   Gout 09/13/2013   Chronic pain syndrome 09/13/2013   Primary osteoarthritis of left knee 09/13/2013   Morbid obesity (HCC) 09/13/2013   Seizures (HCC)    Hypertension    Past Medical History:  Diagnosis Date   Arthritis of left knee    Chronic back pain 09/13/2013   Chronic pain syndrome 09/13/2013   Diabetic ulcer of right ankle (HCC)    GERD (gastroesophageal reflux disease)  hx. of   Gout 09/13/2013   Hemorrhoids    Hypertension    Morbid obesity with body mass index of 45.0-49.9 in adult Christian [name]) 09/13/2013   Osteoarthritis 09/13/2013   Pneumonia    hx. of   Seizures (HCC)    Sleep apnea    has never used C-pap machine due to insurance cost   Type 2 diabetes mellitus, uncontrolled     Family History  Problem Relation Age of Onset   Alcohol abuse Father    Arthritis Maternal Grandmother    Alcohol abuse Paternal Grandmother    Alcohol abuse Paternal Grandfather    Diabetes Maternal  Aunt     Past Surgical History:  Procedure Laterality Date   broken toe 35 years ago     COLONOSCOPY WITH PROPOFOL  N/A 12/15/2022   Procedure: COLONOSCOPY WITH PROPOFOL ;  Surgeon: Therisa Bi, MD;  Location: [facility] ENDOSCOPY;  Service: Gastroenterology;  Laterality: N/A;   IR EMBO ARTERIAL NOT HEMORR HEMANG INC GUIDE ROADMAPPING  04/27/2023   IR RADIOLOGIST EVAL & MGMT  03/29/2023   IR RADIOLOGIST EVAL & MGMT  08/02/2023   REVERSE SHOULDER ARTHROPLASTY Right 07/09/2024   Procedure: ARTHROPLASTY, SHOULDER, TOTAL, REVERSE;  Surgeon: [name], MD;  Location: [facility] OR;  Service: Orthopedics;  Laterality: Right;   REVISION TOTAL SHOULDER TO REVERSE TOTAL SHOULDER Right 07/25/2024   Procedure: REVISION, REVERSE TOTAL ARTHROPLASTY, SHOULDER;  Surgeon: [name], MD;  Location: [name];  Service: Orthopedics;  Laterality: Right;  right shoulder open reduction, poly exchange   REVISION TOTAL SHOULDER TO REVERSE TOTAL SHOULDER Right 09/05/2024   Procedure: REVISION, REVERSE TOTAL ARTHROPLASTY, SHOULDER;  Surgeon: [name], MD;  Location: [name];  Service: Orthopedics;  Laterality: Right;  RIGHT SHOULDER HUMERAL REVISION   TOTAL KNEE ARTHROPLASTY Right 05/01/2015   Procedure: RIGHT TOTAL KNEE ARTHROPLASTY;  Surgeon: Lonni CINDERELLA Poli, MD;  Location: [facility] ORS;  Service: Orthopedics;  Laterality: Right;   WISDOM TOOTH EXTRACTION     Social History   Occupational History   Occupation: Retired  Tobacco Use   Smoking status: Former    Current packs/day: 0.00    Average packs/day: 1 pack/day for 42.2 years (42.2 ttl pk-yrs)    Types: Cigarettes    Start date: 12/12/1961    Quit date: 03/08/2004    Years since quitting: 20.6   Smokeless tobacco: Never  Vaping Use   Vaping status: Never Used  Substance and Sexual Activity   Alcohol use: No    Comment: rare   Drug use: No   Sexual activity: Yes    Partners: Female

## 2024-10-22 ENCOUNTER — Other Ambulatory Visit: Payer: Self-pay | Admitting: Surgical

## 2024-10-22 MED ORDER — OXYCODONE HCL 5 MG PO TABS: 5.0000 mg | ORAL_TABLET | Freq: Four times a day (QID) | ORAL | 0 refills | Status: DC | PRN

## 2024-10-23 ENCOUNTER — Encounter: Admitting: Orthopedic Surgery

## 2024-10-29 ENCOUNTER — Other Ambulatory Visit: Payer: Self-pay | Admitting: Orthopedic Surgery

## 2024-11-01 ENCOUNTER — Ambulatory Visit: Admitting: Cardiology

## 2024-11-06 ENCOUNTER — Other Ambulatory Visit: Payer: Self-pay | Admitting: Orthopedic Surgery

## 2024-11-06 MED ORDER — OXYCODONE HCL 5 MG PO TABS: 5.0000 mg | ORAL_TABLET | Freq: Four times a day (QID) | ORAL | 0 refills | Status: DC | PRN

## 2024-11-06 NOTE — Telephone Encounter (Signed)
 Med sent.

## 2024-11-19 ENCOUNTER — Encounter: Payer: Self-pay | Admitting: Orthopedic Surgery

## 2024-11-20 ENCOUNTER — Other Ambulatory Visit: Payer: Self-pay | Admitting: Orthopedic Surgery

## 2024-11-20 MED ORDER — OXYCODONE HCL 5 MG PO TABS: 5.0000 mg | ORAL_TABLET | Freq: Four times a day (QID) | ORAL | 0 refills | Status: DC | PRN

## 2024-11-21 ENCOUNTER — Other Ambulatory Visit: Payer: Self-pay | Admitting: Orthopedic Surgery

## 2024-11-21 NOTE — Telephone Encounter (Signed)
 Meds sent

## 2024-11-28 ENCOUNTER — Other Ambulatory Visit: Payer: Self-pay | Admitting: Physician Assistant

## 2024-11-28 NOTE — Telephone Encounter (Signed)
 Luke/dean patient

## 2024-11-30 ENCOUNTER — Encounter: Payer: Self-pay | Admitting: Orthopedic Surgery

## 2024-12-03 ENCOUNTER — Other Ambulatory Visit: Payer: Self-pay | Admitting: Surgical

## 2024-12-03 MED ORDER — DICLOFENAC SODIUM 75 MG PO TBEC: 75.0000 mg | DELAYED_RELEASE_TABLET | Freq: Two times a day (BID) | ORAL | 0 refills | Status: AC

## 2024-12-06 ENCOUNTER — Encounter: Payer: Self-pay | Admitting: Orthopedic Surgery

## 2024-12-06 ENCOUNTER — Other Ambulatory Visit: Payer: Self-pay | Admitting: Orthopedic Surgery

## 2024-12-06 MED ORDER — OXYCODONE HCL 5 MG PO TABS: 5.0000 mg | ORAL_TABLET | Freq: Four times a day (QID) | ORAL | 0 refills | Status: AC | PRN

## 2024-12-06 NOTE — Telephone Encounter (Signed)
 sent

## 2024-12-16 ENCOUNTER — Ambulatory Visit: Admitting: Cardiology

## 2024-12-16 ENCOUNTER — Ambulatory Visit: Admitting: Sports Medicine
